# Patient Record
Sex: Female | Born: 1955 | Race: Black or African American | Hispanic: No | Marital: Married | Smoking: Former smoker
Health system: Southern US, Community
[De-identification: ages and names within clinical notes are randomized; demographics above are authoritative.]

## PROBLEM LIST (undated history)

## (undated) DIAGNOSIS — G35 Multiple sclerosis: Secondary | ICD-10-CM

## (undated) DIAGNOSIS — E119 Type 2 diabetes mellitus without complications: Secondary | ICD-10-CM

## (undated) DIAGNOSIS — I499 Cardiac arrhythmia, unspecified: Secondary | ICD-10-CM

## (undated) DIAGNOSIS — I480 Paroxysmal atrial fibrillation: Secondary | ICD-10-CM

## (undated) DIAGNOSIS — I509 Heart failure, unspecified: Secondary | ICD-10-CM

## (undated) DIAGNOSIS — M1990 Unspecified osteoarthritis, unspecified site: Secondary | ICD-10-CM

## (undated) DIAGNOSIS — C801 Malignant (primary) neoplasm, unspecified: Secondary | ICD-10-CM

## (undated) DIAGNOSIS — I10 Essential (primary) hypertension: Secondary | ICD-10-CM

## (undated) HISTORY — DX: Paroxysmal atrial fibrillation: I48.0

## (undated) HISTORY — DX: Essential (primary) hypertension: I10

## (undated) HISTORY — DX: Heart failure, unspecified: I50.9

---

## 2013-10-22 ENCOUNTER — Other Ambulatory Visit: Payer: Self-pay | Admitting: Geriatric Medicine

## 2013-10-22 DIAGNOSIS — N644 Mastodynia: Secondary | ICD-10-CM

## 2013-10-31 ENCOUNTER — Other Ambulatory Visit: Payer: Self-pay

## 2013-11-26 ENCOUNTER — Ambulatory Visit
Admission: RE | Admit: 2013-11-26 | Discharge: 2013-11-26 | Disposition: A | Discharge status: Home / Self Care | Payer: Medicaid Other | Source: Ambulatory Visit | Attending: Geriatric Medicine | Admitting: Geriatric Medicine

## 2013-11-26 ENCOUNTER — Encounter (INDEPENDENT_AMBULATORY_CARE_PROVIDER_SITE_OTHER): Payer: Self-pay

## 2013-11-26 DIAGNOSIS — N644 Mastodynia: Secondary | ICD-10-CM

## 2014-10-27 ENCOUNTER — Other Ambulatory Visit: Payer: Self-pay

## 2014-10-27 DIAGNOSIS — Z9289 Personal history of other medical treatment: Secondary | ICD-10-CM

## 2014-12-02 ENCOUNTER — Ambulatory Visit
Admission: RE | Admit: 2014-12-02 | Discharge: 2014-12-02 | Disposition: A | Discharge status: Home / Self Care | Payer: Medicaid Other | Source: Ambulatory Visit

## 2014-12-02 DIAGNOSIS — Z9289 Personal history of other medical treatment: Secondary | ICD-10-CM

## 2015-06-28 ENCOUNTER — Encounter: Payer: Self-pay | Admitting: Neurology

## 2015-06-28 ENCOUNTER — Ambulatory Visit (INDEPENDENT_AMBULATORY_CARE_PROVIDER_SITE_OTHER): Payer: Medicaid Other | Admitting: Neurology

## 2015-06-28 VITALS — BP 136/86 | HR 68

## 2015-06-28 DIAGNOSIS — G35 Multiple sclerosis: Secondary | ICD-10-CM | POA: Diagnosis not present

## 2015-06-28 NOTE — Patient Instructions (Signed)
I can't say that you have MS.  Unfortunately, we are unable to perform the tests that would help confirm MS.  But since you have been clinically stable off of MS medications for the past 20 years, I wouldn't treat you for MS even if you have it.  No further management or workup needed.

## 2015-06-28 NOTE — Progress Notes (Signed)
Chart forwarded.  

## 2015-06-28 NOTE — Progress Notes (Signed)
NEUROLOGY CONSULTATION NOTE  Zoe Cobb MRN: [id number] DOB: 11-22-1955  Referring provider: Norva Riffle, PA Primary care provider: Norva Riffle, PA  Reason for consult:  MS  HISTORY OF PRESENT ILLNESS: Zoe Cobb is a 59 year old right-handed female with morbid obesity and hypertension who presents for evaluation of possible multiple sclerosis.  In 1994-95, she had a two week period of falls, urinary incontinence, bilateral blurred vision and difficulty holding a cup.  She was admitted to a hospital in [place], [place] where she was diagnosed with multiple sclerosis.  She said she only had a test where she had electrodes attached to her chest and head and was told to lay still, which I presume was an EEG.  She maintains that she did not have any brain imaging or lumbar puncture performed.  She was not treated with steroids but she was discharged on naprosyn and she improved.  She never had any recurrence of neurological symptoms.  She was never started on disease modifying therapy, nor has she followed up with a neurologist.  Over the years, she has become morbidly obese and is now wheelchair-bound.  She is unable to ambulate.    PAST MEDICAL HISTORY: Past Medical History  Diagnosis Date  . Hypertension     PAST SURGICAL HISTORY: No past surgical history on file.  MEDICATIONS: No current outpatient prescriptions on file prior to visit.   No current facility-administered medications on file prior to visit.    ALLERGIES: Allergies  Allergen Reactions  . Penicillins Swelling    FAMILY HISTORY: No family history on file.  SOCIAL HISTORY: Social History   Social History  . Marital Status: Single    Spouse Name: N/A  . Number of Children: N/A  . Years of Education: N/A   Occupational History  . Not on file.   Social History Main Topics  . Smoking status: Former Games developer  . Smokeless tobacco: Not on file  . Alcohol Use: Not on file  . Drug Use: Not on file    . Sexual Activity: Not on file   Other Topics Concern  . Not on file   Social History Narrative   Pt lives by herself, she completed hs.     REVIEW OF SYSTEMS: Constitutional: No fevers, chills, or sweats, no generalized fatigue, change in appetite Eyes: No visual changes, double vision, eye pain Ear, nose and throat: No hearing loss, ear pain, nasal congestion, sore throat Cardiovascular: No chest pain, palpitations Respiratory:  No shortness of breath at rest or with exertion, wheezes GastrointestinaI: No nausea, vomiting, diarrhea, abdominal pain, fecal incontinence Genitourinary:  No dysuria, urinary retention or frequency Musculoskeletal:  No neck pain, back pain Integumentary: No rash, pruritus, skin lesions Neurological: as above Psychiatric: No depression, insomnia, anxiety Endocrine: No palpitations, fatigue, diaphoresis, mood swings, change in appetite, change in weight, increased thirst Hematologic/Lymphatic:  No anemia, purpura, petechiae. Allergic/Immunologic: no itchy/runny eyes, nasal congestion, recent allergic reactions, rashes  PHYSICAL EXAM: Filed Vitals:   06/28/15 0909  BP: 136/86  Pulse: 68   General: No acute distress.  Patient appears well-groomed.  Morbidly obese Head:  Normocephalic/atraumatic Eyes:  fundi unremarkable, without vessel changes, exudates, hemorrhages or papilledema. Neck: supple, no paraspinal tenderness, full range of motion Back: No paraspinal tenderness Heart: regular rate and rhythm Lungs: Clear to auscultation bilaterally. Vascular: No carotid bruits. Neurological Exam: Mental status: alert and oriented to person, place, and time, recent and remote memory intact, fund of knowledge intact, attention and concentration intact, speech  fluent and not dysarthric, language intact. Cranial nerves: CN I: not tested CN II: pupils equal, round and reactive to light, visual fields intact, fundi unremarkable, without vessel changes,  exudates, hemorrhages or papilledema. CN III, IV, VI:  full range of motion, no nystagmus, no ptosis CN V: facial sensation intact CN VII: upper and lower face symmetric CN VIII: hearing intact CN IX, X: gag intact, uvula midline CN XI: sternocleidomastoid and trapezius muscles intact CN XII: tongue midline Bulk & Tone: normal, no fasciculations. Motor:  Unable to abduct arms.  Triceps, biceps and grip 5/5.  Unable to move legs proximally but distal strength in lower extremities, including ankle dorsiflexion, 5/5. Sensation:  Pinprick reduced in right hand and both feet.  Vibration sensation intact. Deep Tendon Reflexes:  2+ in brachioradialis reflexes, otherwise areflexive, toes downgoing. Finger to nose testing:  Without dysmetria.  Heel to shin:  Unable to assess Gait:  Wheelchair-bound.  IMPRESSION: History of MS Morbid obesity  I cannot make a clinical diagnosis of multiple sclerosis based on exam and history.  She did not have an MS workup and she did not have any MS "flare-ups" over the years.  She is disabled and wheelchair-bound due to morbid obesity.  Unfortunately, due to her size, I am unable to perform an MS workup (MRI and lumbar puncture) but I probably wouldn't treat with disease-modifying therapy anyway since she has been stable (if indeed she has MS).  Therefore, no further workup or treatment warranted.  She should continue to focus on weight loss.  45 minutes spent face to face with patient, over 50% spent discussing diagnosis and impression.  Thank you for allowing me to take part in the care of this patient.  Shon Millet, DO  CC:  Norva Riffle, PA

## 2016-03-14 ENCOUNTER — Other Ambulatory Visit: Payer: Self-pay | Admitting: Adult Health

## 2016-03-14 ENCOUNTER — Other Ambulatory Visit: Payer: Self-pay | Admitting: General Practice

## 2016-03-14 DIAGNOSIS — Z1231 Encounter for screening mammogram for malignant neoplasm of breast: Secondary | ICD-10-CM

## 2016-03-24 ENCOUNTER — Ambulatory Visit
Admission: RE | Admit: 2016-03-24 | Discharge: 2016-03-24 | Disposition: A | Discharge status: Home / Self Care | Payer: Medicaid Other | Source: Ambulatory Visit | Attending: Family Medicine | Admitting: Family Medicine

## 2016-03-24 DIAGNOSIS — Z1231 Encounter for screening mammogram for malignant neoplasm of breast: Secondary | ICD-10-CM

## 2016-08-28 ENCOUNTER — Observation Stay (HOSPITAL_COMMUNITY): Payer: Medicaid Other

## 2016-08-28 ENCOUNTER — Encounter (HOSPITAL_COMMUNITY): Payer: Self-pay | Admitting: Emergency Medicine

## 2016-08-28 ENCOUNTER — Emergency Department (HOSPITAL_COMMUNITY): Payer: Medicaid Other

## 2016-08-28 ENCOUNTER — Observation Stay (HOSPITAL_COMMUNITY)
Admission: EM | Admit: 2016-08-28 | Discharge: 2016-08-30 | Disposition: A | Discharge status: Home / Self Care | Payer: Medicaid Other | Attending: Family Medicine | Admitting: Family Medicine

## 2016-08-28 DIAGNOSIS — R0602 Shortness of breath: Secondary | ICD-10-CM | POA: Diagnosis not present

## 2016-08-28 DIAGNOSIS — Z880 Allergy status to penicillin: Secondary | ICD-10-CM | POA: Insufficient documentation

## 2016-08-28 DIAGNOSIS — B1920 Unspecified viral hepatitis C without hepatic coma: Secondary | ICD-10-CM | POA: Diagnosis not present

## 2016-08-28 DIAGNOSIS — Z79899 Other long term (current) drug therapy: Secondary | ICD-10-CM | POA: Insufficient documentation

## 2016-08-28 DIAGNOSIS — Z993 Dependence on wheelchair: Secondary | ICD-10-CM | POA: Diagnosis not present

## 2016-08-28 DIAGNOSIS — Z87891 Personal history of nicotine dependence: Secondary | ICD-10-CM | POA: Insufficient documentation

## 2016-08-28 DIAGNOSIS — Z6841 Body Mass Index (BMI) 40.0 and over, adult: Secondary | ICD-10-CM | POA: Diagnosis not present

## 2016-08-28 DIAGNOSIS — B1910 Unspecified viral hepatitis B without hepatic coma: Secondary | ICD-10-CM | POA: Insufficient documentation

## 2016-08-28 DIAGNOSIS — D696 Thrombocytopenia, unspecified: Secondary | ICD-10-CM | POA: Insufficient documentation

## 2016-08-28 DIAGNOSIS — I509 Heart failure, unspecified: Secondary | ICD-10-CM | POA: Diagnosis present

## 2016-08-28 DIAGNOSIS — I10 Essential (primary) hypertension: Secondary | ICD-10-CM

## 2016-08-28 DIAGNOSIS — Z7401 Bed confinement status: Secondary | ICD-10-CM | POA: Insufficient documentation

## 2016-08-28 DIAGNOSIS — Z7982 Long term (current) use of aspirin: Secondary | ICD-10-CM | POA: Diagnosis not present

## 2016-08-28 DIAGNOSIS — I110 Hypertensive heart disease with heart failure: Secondary | ICD-10-CM | POA: Diagnosis not present

## 2016-08-28 DIAGNOSIS — I5030 Unspecified diastolic (congestive) heart failure: Secondary | ICD-10-CM | POA: Diagnosis not present

## 2016-08-28 DIAGNOSIS — G4733 Obstructive sleep apnea (adult) (pediatric): Secondary | ICD-10-CM | POA: Insufficient documentation

## 2016-08-28 DIAGNOSIS — Z8249 Family history of ischemic heart disease and other diseases of the circulatory system: Secondary | ICD-10-CM | POA: Diagnosis not present

## 2016-08-28 DIAGNOSIS — L8990 Pressure ulcer of unspecified site, unspecified stage: Secondary | ICD-10-CM | POA: Insufficient documentation

## 2016-08-28 DIAGNOSIS — D649 Anemia, unspecified: Secondary | ICD-10-CM | POA: Diagnosis not present

## 2016-08-28 HISTORY — DX: Morbid (severe) obesity due to excess calories: E66.01

## 2016-08-28 LAB — BASIC METABOLIC PANEL
ANION GAP: 8 (ref 5–15)
BUN: 21 mg/dL — AB (ref 6–20)
CO2: 28 mmol/L (ref 22–32)
Calcium: 9.3 mg/dL (ref 8.9–10.3)
Chloride: 110 mmol/L (ref 101–111)
Creatinine, Ser: 0.88 mg/dL (ref 0.44–1.00)
GFR calc Af Amer: >60 mL/min (ref >60)
GLUCOSE: 90 mg/dL (ref 65–99)
POTASSIUM: 4.6 mmol/L (ref 3.5–5.1)
Sodium: 146 mmol/L — ABNORMAL HIGH (ref 135–145)

## 2016-08-28 LAB — CBC
HEMATOCRIT: 33 % — AB (ref 36.0–46.0)
HEMOGLOBIN: 9.9 g/dL — AB (ref 12.0–15.0)
MCH: 25.1 pg — ABNORMAL LOW (ref 26.0–34.0)
MCHC: 30 g/dL (ref 30.0–36.0)
MCV: 83.8 fL (ref 78.0–100.0)
Platelets: 104 10*3/uL — ABNORMAL LOW (ref 150–400)
RBC: 3.94 MIL/uL (ref 3.87–5.11)
RDW: 15.9 % — AB (ref 11.5–15.5)
WBC: 4.7 10*3/uL (ref 4.0–10.5)

## 2016-08-28 LAB — INFLUENZA PANEL BY PCR (TYPE A & B)
INFLBPCR: NEGATIVE
Influenza A By PCR: NEGATIVE

## 2016-08-28 LAB — BRAIN NATRIURETIC PEPTIDE: B Natriuretic Peptide: 220.1 pg/mL — ABNORMAL HIGH (ref 0.0–100.0)

## 2016-08-28 LAB — RETICULOCYTES
RBC.: 3.76 MIL/uL — AB (ref 3.87–5.11)
RETIC CT PCT: 1.1 % (ref 0.4–3.1)
Retic Count, Absolute: 41.4 10*3/uL (ref 19.0–186.0)

## 2016-08-28 LAB — LIPID PANEL
CHOLESTEROL: 139 mg/dL (ref 0–200)
HDL: 58 mg/dL (ref >40)
LDL Cholesterol: 70 mg/dL (ref 0–99)
TRIGLYCERIDES: 53 mg/dL (ref <150)
Total CHOL/HDL Ratio: 2.4 RATIO
VLDL: 11 mg/dL (ref 0–40)

## 2016-08-28 LAB — TSH: TSH: 5.721 u[IU]/mL — AB (ref 0.350–4.500)

## 2016-08-28 LAB — IRON AND TIBC
IRON: 67 ug/dL (ref 28–170)
SATURATION RATIOS: 13 % (ref 10.4–31.8)
TIBC: 500 ug/dL — AB (ref 250–450)
UIBC: 433 ug/dL

## 2016-08-28 LAB — FERRITIN: Ferritin: 26 ng/mL (ref 11–307)

## 2016-08-28 LAB — TROPONIN I
Troponin I: <0.03 ng/mL (ref <0.03)
Troponin I: <0.03 ng/mL (ref <0.03)

## 2016-08-28 LAB — I-STAT TROPONIN, ED: Troponin i, poc: 0 ng/mL (ref 0.00–0.08)

## 2016-08-28 LAB — GLUCOSE, CAPILLARY: GLUCOSE-CAPILLARY: 86 mg/dL (ref 65–99)

## 2016-08-28 LAB — FOLATE: FOLATE: 6.2 ng/mL (ref >5.9)

## 2016-08-28 LAB — VITAMIN B12: Vitamin B-12: 3942 pg/mL — ABNORMAL HIGH (ref 180–914)

## 2016-08-28 MED ORDER — ALBUTEROL SULFATE (2.5 MG/3ML) 0.083% IN NEBU
2.5000 mg | INHALATION_SOLUTION | RESPIRATORY_TRACT | Status: DC
  Administered 2016-08-28: 2.5 mg via RESPIRATORY_TRACT
  Filled 2016-08-28 (×2): qty 3

## 2016-08-28 MED ORDER — VITAMIN B-12 1000 MCG PO TABS
500.0000 ug | ORAL_TABLET | Freq: Every day | ORAL | Status: DC
  Administered 2016-08-29: 500 ug via ORAL
  Filled 2016-08-28: qty 1

## 2016-08-28 MED ORDER — VITAMIN D 1000 UNITS PO TABS
1000.0000 [IU] | ORAL_TABLET | Freq: Every day | ORAL | Status: DC
  Administered 2016-08-29 – 2016-08-30 (×2): 1000 [IU] via ORAL
  Filled 2016-08-28 (×2): qty 1

## 2016-08-28 MED ORDER — ENOXAPARIN SODIUM 40 MG/0.4ML ~~LOC~~ SOLN
40.0000 mg | SUBCUTANEOUS | Status: DC
  Administered 2016-08-28: 40 mg via SUBCUTANEOUS
  Filled 2016-08-28: qty 0.4

## 2016-08-28 MED ORDER — ACETAMINOPHEN 325 MG PO TABS
650.0000 mg | ORAL_TABLET | Freq: Four times a day (QID) | ORAL | Status: DC | PRN
  Administered 2016-08-29 – 2016-08-30 (×2): 650 mg via ORAL
  Filled 2016-08-28 (×2): qty 2

## 2016-08-28 MED ORDER — ALBUTEROL SULFATE (2.5 MG/3ML) 0.083% IN NEBU
5.0000 mg | INHALATION_SOLUTION | Freq: Once | RESPIRATORY_TRACT | Status: AC
  Administered 2016-08-28: 5 mg via RESPIRATORY_TRACT
  Filled 2016-08-28: qty 6

## 2016-08-28 MED ORDER — POLYETHYLENE GLYCOL 3350 17 G PO PACK: 17.0000 g | PACK | Freq: Every day | ORAL | Status: DC | PRN

## 2016-08-28 MED ORDER — ALBUTEROL SULFATE (2.5 MG/3ML) 0.083% IN NEBU: 2.5000 mg | INHALATION_SOLUTION | RESPIRATORY_TRACT | Status: DC | PRN

## 2016-08-28 MED ORDER — ACETAMINOPHEN 650 MG RE SUPP: 650.0000 mg | Freq: Four times a day (QID) | RECTAL | Status: DC | PRN

## 2016-08-28 MED ORDER — FUROSEMIDE 10 MG/ML IJ SOLN
40.0000 mg | Freq: Two times a day (BID) | INTRAMUSCULAR | Status: DC
  Administered 2016-08-28 – 2016-08-29 (×2): 40 mg via INTRAVENOUS
  Filled 2016-08-28 (×2): qty 4

## 2016-08-28 MED ORDER — VITAMIN D 1000 UNITS PO TABS: 1000.0000 [IU] | ORAL_TABLET | Freq: Every day | ORAL | Status: DC

## 2016-08-28 MED ORDER — ALBUTEROL SULFATE (2.5 MG/3ML) 0.083% IN NEBU
2.5000 mg | INHALATION_SOLUTION | Freq: Four times a day (QID) | RESPIRATORY_TRACT | Status: DC
  Administered 2016-08-29: 2.5 mg via RESPIRATORY_TRACT
  Filled 2016-08-28 (×2): qty 3

## 2016-08-28 MED ORDER — IPRATROPIUM BROMIDE 0.02 % IN SOLN
0.5000 mg | Freq: Once | RESPIRATORY_TRACT | Status: AC
  Administered 2016-08-28: 0.5 mg via RESPIRATORY_TRACT
  Filled 2016-08-28: qty 2.5

## 2016-08-28 MED ORDER — ASPIRIN EC 81 MG PO TBEC
81.0000 mg | DELAYED_RELEASE_TABLET | Freq: Every day | ORAL | Status: DC
  Administered 2016-08-28 – 2016-08-30 (×3): 81 mg via ORAL
  Filled 2016-08-28 (×4): qty 1

## 2016-08-28 MED ORDER — GLECAPREVIR-PIBRENTASVIR 100-40 MG PO TABS
1.0000 | ORAL_TABLET | Freq: Every day | ORAL | Status: DC
  Administered 2016-08-28: 1 via ORAL

## 2016-08-28 NOTE — ED Triage Notes (Signed)
Pt from home via [facility] with c/o SOB and wheezing starting yesterday, general malaise x 1 week, and left sided chest discomfort starting during transportation to the ED.  Pt states she was started on lasix several weeks ago and that her swelling has only gotten worse.  Given 1 deuoneb and 5 mg albuterol breathing treatment.  A&O.

## 2016-08-28 NOTE — ED Provider Notes (Signed)
[facility]-EMERGENCY DEPT Provider Note   CSN: [id number] Arrival date & time: 08/28/16  7628   History   Chief Complaint Chief Complaint  Patient presents with  . Shortness of Breath  . Chest Pain    HPI Zoe Cobb is a 61 y.o. female.  HPI     Patient brought to the ER by EMS for SOB and wheezing that started yesterday. She has also had body aches and CP on the left side. She is seen by a Home Health visiting physician with [facility] since she is unable to get to PCP office. She was given a duoneb en route which she says did help mildly she is still has increased effort of breathing and audible wheezing. Recently started on lasix for lower extremity swelling but denies a diagnosis of CHF, non-ambulatory at baseline, pt says she does not get up ever.  She denies having fevers, nausea, vomiting, diarrhea, weakness or confusion. She admits to only a PMH of hypertension. The RN is working on getting records transferred.   Past Medical History:  Diagnosis Date  . Hypertension     Patient Active Problem List   Diagnosis Date Noted  . Morbid obesity due to excess calories (HCC) 06/28/2015    History reviewed. No pertinent surgical history.  OB History    No data available       Home Medications    Prior to Admission medications   Medication Sig Start Date End Date Taking? Authorizing Provider  aspirin 325 MG tablet Take 325 mg by mouth daily.    Yes Historical Provider, MD  cholecalciferol (VITAMIN D) 1000 units tablet Take 1,000 Units by mouth daily.   Yes Historical Provider, MD  cyanocobalamin 500 MCG tablet Take 500 mcg by mouth daily.   Yes Historical Provider, MD  furosemide (LASIX) 20 MG tablet Take 10 mg by mouth daily.   Yes Historical Provider, MD  MAVYRET 100-40 MG TABS Take 1 tablet by mouth daily. 08/16/16  Yes Historical Provider, MD    Family History History reviewed. No pertinent family history.  Social History Social History  Substance Use Topics   . Smoking status: Former Smoker    Types: Cigarettes  . Smokeless tobacco: Never Used  . Alcohol use 0.0 oz/week     Comment: once in a while     Allergies   Penicillins   Review of Systems Review of Systems  Review of Systems All other systems negative except as documented in the HPI. All pertinent positives and negatives as reviewed in the HPI.  Physical Exam Updated Vital Signs BP 108/57   Pulse 63   Temp (!) 96.4 F (35.8 C) (Temporal) Comment: Difficulty obtaining PO or axillary temp, temporal temp obtained instead  Resp 16   Ht 5\' 1"  (1.549 m)   Wt (!) 226.8 kg   SpO2 99%   BMI 94.47 kg/m   Physical Exam  Constitutional: She appears well-developed and well-nourished. No distress.  Morbid obesity  HENT:  Head: Normocephalic and atraumatic.  Eyes: Pupils are equal, round, and reactive to light.  Neck: Normal range of motion. Neck supple.  Cardiovascular: Normal rate and regular rhythm.   Pulmonary/Chest: Effort normal. Tachypnea noted. She has wheezes.  Increased effort of breathing Wheezing resolved with breathing treatments.  Abdominal: Soft.  Musculoskeletal:  Due to body habitus unable to appreciate LE swelling but patient endorses legs are more swollen than normal.  Neurological: She is alert.  Skin: Skin is warm and dry.  Nursing note and vitals reviewed.    ED Treatments / Results  Labs (all labs ordered are listed, but only abnormal results are displayed) Labs Reviewed  BASIC METABOLIC PANEL - Abnormal; Notable for the following:       Result Value   Sodium 146 (*)    BUN 21 (*)    All other components within normal limits  CBC - Abnormal; Notable for the following:    Hemoglobin 9.9 (*)    HCT 33.0 (*)    MCH 25.1 (*)    RDW 15.9 (*)    Platelets 104 (*)    All other components within normal limits  BRAIN NATRIURETIC PEPTIDE - Abnormal; Notable for the following:    B Natriuretic Peptide 220.1 (*)    All other components within normal  limits  INFLUENZA PANEL BY PCR (TYPE A & B)  I-STAT TROPOININ, ED    EKG  EKG Interpretation  Date/Time:  Monday August 28 2016 08:58:17 EST Ventricular Rate:  66 PR Interval:    QRS Duration: 97 QT Interval:  433 QTC Calculation: 454 R Axis:   64 Text Interpretation:  Normal sinus rhythm Low voltage, precordial leads no acute ST/T changes No old tracing to compare Confirmed by GOLDSTON MD, SCOTT [phone]) on 08/28/2016 9:06:31 AM       Radiology Dg Chest 1 View  Result Date: 08/28/2016 CLINICAL DATA:  Sob for a few weeks,, Unable to do lat due to pt size /big arms unable to penetrate and unable to bring arms higher morbid obesity. EXAM: CHEST 1 VIEW COMPARISON:  None. FINDINGS: Moderately degraded exam secondary to patient body habitus and AP portable technique. Midline trachea. Cardiomegaly accentuated by AP portable technique. Moderate right hemidiaphragm elevation. No definite pleural fluid. No pneumothorax. Interstitial prominence. Left lung base not well evaluated. Otherwise, no lobar consolidation.   IMPRESSION: Moderately degraded exam, as detailed above. Cardiomegaly with pulmonary interstitial prominence. Although this could be technique related, mild venous congestion cannot be excluded. Electronically Signed   By: Jeronimo Greaves M.D.   On: 08/28/2016 09:39   Procedures Procedures (including critical care time)  Medications Ordered in ED Medications  albuterol (PROVENTIL) (2.5 MG/3ML) 0.083% nebulizer solution 5 mg (5 mg Nebulization Given 08/28/16 1053)  ipratropium (ATROVENT) nebulizer solution 0.5 mg (0.5 mg Nebulization Given 08/28/16 1053)     Initial Impression / Assessment and Plan / ED Course  I have reviewed the triage vital signs and the nursing notes.  Pertinent labs & imaging results that were available during my care of the patient were reviewed by me and considered in my medical decision making (see chart for details).  Dr. Criss Alvine has seen patient as  well, her history, symptoms, PE and chest xray with BNP shows concern for new onset CHF. Patient lives alone and is unable to do much without getting really short of breath, I believe she needs an admission obs for cardiac echo for evaluation of new onset CHF.  I spoke with [facility], they have agreed to admission Dr. Lum Babe, Tele, obs, [facility]mits   Final Clinical Impressions(s) / ED Diagnoses   Final diagnoses:  New onset of congestive heart failure [facility])    New Prescriptions New Prescriptions   No medications on file     Marlon Pel, PA-C 08/28/16 1144    Pricilla Loveless, MD 09/01/16 2222

## 2016-08-28 NOTE — ED Notes (Signed)
Admitting at bedside 

## 2016-08-28 NOTE — Progress Notes (Signed)
Received Zoe Cobb from ED via transport on [facility] bed.  Bed inflated.  Patient alert and oriented.  Oriented to room and how to call RN or NT.  Patient denies pain.

## 2016-08-28 NOTE — ED Notes (Signed)
Patient transported to X-ray 

## 2016-08-28 NOTE — Consult Note (Signed)
CARDIOLOGY CONSULT NOTE   Patient ID: Zoe Cobb MRN: [id number] DOB/AGE: June 01, 1956 61 y.o.  Admit date: 08/28/2016  Requesting Physician: Dr. Lum Babe Primary Physician:   Norm Salt, PA Primary Cardiologist:  New  Reason for Consultation:  New CHF  HPI: Zoe Cobb is a 61 y.o. female with a history of super morbid obesity, Hep B/C, HTN and wheelchair bound who presented to [facility] ED today with worsening LE edema and SOB with wheezing.   The patient lives at home alone but her sister does help with her care and medications. She also has a primary care doctor that does home visits. She denies a history of HLD, DMT2 or cardiac disease. She does take daily aspirin and medication for HTN although she does not know what. She is wheelchair-bound and does not perform any level of exertion. She was previously diagnosed with multiple sclerosis but then was told by a neurologist that she did not have this. She is unable to ambulate or stand.  She was in her usual state of health until last week when she started to notice lower extremity edema as well as shortness of breath. she was seen by her PCP who started her on Lasix but this did not seem to help. She had progressively worse lower extremity edema as well as shortness of breath. SOB not necessarily worse in a supine position or better propped up on pillows. No PND. She denies chest pain, dizziness or syncope. No palpitations.   She called EMS this morning due to dyspnea and she was brought to the ER. Chest x-ray shows possible CHF and BNP mildly elevated around 200. Blood pressures have been soft. She was given albuterol with some improvement.   Past Medical History:  Diagnosis Date  . Hypertension   . Morbid obesity (HCC)      History reviewed. No pertinent surgical history.  Allergies  Allergen Reactions  . Penicillins Swelling    Has patient had a PCN reaction causing immediate rash, facial/tongue/throat swelling, SOB or  lightheadedness with hypotension: Yes Has patient had a PCN reaction causing severe rash involving mucus membranes or skin necrosis: No Has patient had a PCN reaction that required hospitalization No Has patient had a PCN reaction occurring within the last 10 years: No If all of the above answers are "NO", then may proceed with Cephalosporin use.     I have reviewed the patient's current medications     Prior to Admission medications   Medication Sig Start Date End Date Taking? Authorizing Provider  aspirin 325 MG tablet Take 325 mg by mouth daily.    Yes Historical Provider, MD  cholecalciferol (VITAMIN D) 1000 units tablet Take 1,000 Units by mouth daily.   Yes Historical Provider, MD  cyanocobalamin 500 MCG tablet Take 500 mcg by mouth daily.   Yes Historical Provider, MD  furosemide (LASIX) 20 MG tablet Take 10 mg by mouth daily.   Yes Historical Provider, MD  MAVYRET 100-40 MG TABS Take 1 tablet by mouth daily. 08/16/16  Yes Historical Provider, MD     Social History   Social History  . Marital status: Single    Spouse name: N/A  . Number of children: N/A  . Years of education: N/A   Occupational History  . Not on file.   Social History Main Topics  . Smoking status: Former Smoker    Types: Cigarettes  . Smokeless tobacco: Never Used  . Alcohol use 0.0 oz/week  Comment: once in a while  . Drug use: No  . Sexual activity: Not on file   Other Topics Concern  . Not on file   Social History Narrative   Pt lives by herself, she completed hs.     Family Status  Relation Status  . Mother    Family History  Problem Relation Age of Onset  . Hypertension Mother   . Diabetes Mother     ROS:  Full 14 point review of systems complete and found to be negative unless listed above.  Physical Exam: Blood pressure 97/55, pulse 67, temperature (!) 96.7 F (35.9 C), temperature source Temporal, resp. rate 22, height 5\' 1"  (1.549 m), weight (!) 500 lb (226.8 kg), SpO2  93 %.  General: Well developed, well nourished, female in no acute distress, morbidly obese Head: Eyes PERRLA, No xanthomas.   Normocephalic and atraumatic, oropharynx without edema or exudate. Lungs: expiratory wheezing  Heart: HRRR S1 S2, no rub/gallop, Heart irregular rate and rhythm with S1, S2  No murmur. pulses are 2+ extrem.   Neck: No carotid bruits. No lymphadenopathy. JVD diffuclt to assess given body habitus. Abdomen: Bowel sounds present, abdomen soft and non-tender without masses or hernias noted. Msk:  No spine or cva tenderness. No weakness, no joint deformities or effusions. Extremities: No clubbing or cyanosis.  2+ LE edema.  Neuro: Alert and oriented X 3. No focal deficits noted. Psych:  Good affect, responds appropriately Skin: No rashes or lesions noted.  Labs:   Lab Results  Component Value Date   WBC 4.7 08/28/2016   HGB 9.9 (L) 08/28/2016   HCT 33.0 (L) 08/28/2016   MCV 83.8 08/28/2016   PLT 104 (L) 08/28/2016   No results for input(s): INR in the last 72 hours.   Recent Labs Lab 08/28/16 0950  NA 146*  K 4.6  CL 110  CO2 28  BUN 21*  CREATININE 0.88  CALCIUM 9.3  GLUCOSE 90   No results found for: MG No results for input(s): CKTOTAL, CKMB, TROPONINI in the last 72 hours.  Recent Labs  08/28/16 1022  TROPIPOC 0.00   No results found for: PROBNP No results found for: CHOL, HDL, LDLCALC, TRIG No results found for: DDIMER No results found for: LIPASE, AMYLASE No results found for: TSH, T4TOTAL, T3FREE, THYROIDAB No results found for: VITAMINB12, FOLATE, FERRITIN, TIBC, IRON, RETICCTPCT  Echo: pending  ECG:  NSR HR 66  Radiology:  Dg Chest 1 View  Result Date: 08/28/2016 CLINICAL DATA:  Sob for a few weeks,, Unable to do lat due to pt size /big arms unable to penetrate and unable to bring arms higher morbid obesity. EXAM: CHEST 1 VIEW COMPARISON:  None. FINDINGS: Moderately degraded exam secondary to patient body habitus and AP portable  technique. Midline trachea. Cardiomegaly accentuated by AP portable technique. Moderate right hemidiaphragm elevation. No definite pleural fluid. No pneumothorax. Interstitial prominence. Left lung base not well evaluated. Otherwise, no lobar consolidation. IMPRESSION: Moderately degraded exam, as detailed above. Cardiomegaly with pulmonary interstitial prominence. Although this could be technique related, mild venous congestion cannot be excluded. Electronically Signed   By: Jeronimo Greaves M.D.   On: 08/28/2016 09:39    ASSESSMENT AND PLAN:    Active Problems:   Morbid obesity due to excess calories (HCC)   CHF (congestive heart failure) (HCC)   Shortness of breath  Zoe Cobb is a 61 y.o. female with a history of super morbid obesity, HTN and wheelchair bound who presented  to [facility] ED today with worsening LE edema and SOB with wheezing.   Acute CHF, unspecified: 2D ECHO pending. BNP mildly elevated (but can be falsely low in the setting obesity). CXR with possible pulmonary vascular congestion. Will start her on lasix 40mg  BID and continue to monitor.    HTN: BPs have been soft. Continue to monitor.  Super morbid obesity: Body mass index is 94.47 kg/m. Diet and weight loss recommended. She likely has undiagnosed OHS/OSA and sleep study can be set up as an outpatient  Hep C: continue Mavyret  Signed: Cline Crock, PA-C 08/28/2016 2:38 PM  Pager [phone]  Co-Sign MD  Patient examined chart reviewed. No evidence of CHF. She is morbidly obese with OSA and LE edema From being bed bound and venous disease. CXR no CHF for body habitus and BNP in 200 range not  Significant. Agree with echo and bid lasix but cardiology has nothing else to offer this morbidly obese non Ambulatory patient. Will sign off after echo done. Consider home DVT prophylaxis  Charlton Haws

## 2016-08-28 NOTE — H&P (Signed)
Family Medicine Teaching [facility] Admission History and Physical Service Pager: [phone]  Patient name: Zoe Cobb Medical record number: [id number] Date of birth: 04/16/1956 Age: 61 y.o. Gender: female  Primary Care Provider: Norm Salt, PA Consultants: cardiology Code Status: Full (per discussion on admission)  Chief Complaint: shortness of breath  Assessment and Plan: Zoe Cobb is a 61 y.o. female presenting with shortness of breath. PMH is significant for morbid obesity, HTN, Hepatitis B and C.   Shortness of breath: Initially tachypneic in the ED per PA but then resolved after albuterol. Vitals stable, no tachypnea or hypoxia noted on exam. Physical exam notable for morbid obesity, inspiratory and expiratory wheezing, possible systolic murmur, and 2+ pitting edema in bilateral lower extremities. BNP noted to be 220.1 (will take BMI into account with this lab). EKG showing no ST changes or arrhythmia. Chest x-ray poor imaging due to body habitus but there was noted to be Cardiomegaly with pulmonary interstitial prominence. Shortness of breath likely multifactorial however, leading diagnosis is is new onset congestive heart failure. Patient likely has obesity related hypoventilation as well as possible sleep apnea. Less likely infectious etiology as patient is afebrile and there is no leukocytosis. Less likely pulmonary embolism as the patient is not tachypneic and no signs of unilateral extremity swelling, however patient is at increased risk due to immobility. Less likely COPD as patient denies smoking cigarettes, although per chart review did admit to occasional cigarette use. - Admit to FPTS, telemetry, attending Dr. Randolm Idol - Vital signs per floor  - continuous cardiac monitoring  -Echocardiogram -Trend troponins -A.m. EKG's -Aspirin 81 mg daily -Furosemide 40 mg IV twice a day -Consult cardiology -Risk stratification labs; TSH, lipid panel, hemoglobin  A1c -Nebulized albuterol every 4 scheduled, consider changing to when necessary if wheezing improves after 24 hours -Need to obtain baseline weight -Daily weights -Strict I's and O's, Foley catheter -Daily BMP - CPAP qhs for likely OSA  Morbid Obesity: Self reported weight is 500 lbs. Practically immobile at baseline (unable to ambulate or stand).  - Nutrition consulted - Carb modified/heart healthy diet -Risk stratification labs as above  Thrombocytopenia: Platelets 104. Unknown baseline. -On Lovenox for DVT prophylaxis, considering stopping prophylaxis if thrombocytopenia worsens -Daily CBC  Normocytic anemia: Hemoglobin 9.9 with MCV of 83. Possibly B12 deficiency as B12 is on patient's medication list. -Anemia panel -Daily CBC - Continue home B12  Hepatitis B and C: Patient reports having hepatitis B and C. She is currently being treated for her hepatitis C  - Continue home medication (Mavyret)  FEN/GI: Carb modified/heart healthy diet, no IV fluids Prophylaxis: Lovenox  Disposition: Admit to family practice teaching service, telemetry floor, attending Dr. Randolm Idol  History of Present Illness:  Zoe Cobb is a 61 y.o. female with PMH of morbid obesity, HTN, Hep B and C who presenting with worsening dyspnea x 2 days.   Patient states that she became more short of breath over the last 2 days. She notes that at baseline she is unable to ambulate or stand due to her body habitus. She notes that she realized she was more short of breath when she was rolling herself out of her wheelchair onto her bed 2 days ago and yesterday it was the worst it has ever been . Usually  rolling into bed does not cause her to feel short of breath.  She does not that sometimes when rolling out of that she does get short of breath. Her sister lives a couple houses  down from her and prepares all of her meals for her and helps her with activities of daily living. She also has a nurse who visits her every day.    Patient states that 2 weeks ago she was seen by her doctor at her home and was started on 20 mg of Lasix daily because she had some increased swelling in her lower extremities, but at that time she did not have any shortness of breath.  She denies any fevers, chills, body aches, nausea, vomiting, diarrhea, constipation, abdominal pain, chest pain, leg pain, numbness, tingling.    Review Of Systems: Per HPI with the following additions: See history of present illness  ROS  Patient Active Problem List   Diagnosis Date Noted  . Morbid obesity due to excess calories (HCC) 06/28/2015    Past Medical History: Past Medical History:  Diagnosis Date  . Hypertension     Past Surgical History: History reviewed. No pertinent surgical history.  Social History: Social History  Substance Use Topics  . Smoking status: Former Smoker    Types: Cigarettes  . Smokeless tobacco: Never Used  . Alcohol use 0.0 oz/week     Comment: once in a while   Additional social history: Lives alone at home Please also refer to relevant sections of EMR.  Family History: Mother and sister have hypertension, mother had a heart attack at 75 years old  Allergies and Medications: Allergies  Allergen Reactions  . Penicillins Swelling    Has patient had a PCN reaction causing immediate rash, facial/tongue/throat swelling, SOB or lightheadedness with hypotension: Yes Has patient had a PCN reaction causing severe rash involving mucus membranes or skin necrosis: No Has patient had a PCN reaction that required hospitalization No Has patient had a PCN reaction occurring within the last 10 years: No If all of the above answers are "NO", then may proceed with Cephalosporin use.    No current facility-administered medications on file prior to encounter.    Current Outpatient Prescriptions on File Prior to Encounter  Medication Sig Dispense Refill  . aspirin 325 MG tablet Take 325 mg by mouth daily.        Objective: BP 113/67   Pulse 69   Temp (!) 96.7 F (35.9 C) (Temporal)   Resp 18   Ht 5\' 1"  (1.549 m)   Wt (!) 500 lb (226.8 kg)   SpO2 96%   BMI 94.47 kg/m  Exam: General: In no acute distress, laying in hospital bed, morbidly obese, pleasant Eyes: Pupils equal and reactive to light, nonicteric sclera ENTM: No nasal discharge, no oropharyngeal erythema, poor dentition, no tonsillar exudates Neck: Supple, no lymphadenopathy Cardiovascular: Regular rate and rhythm, systolic murmur best heard in second intercostal space left parasternal area. 2+ pitting edema from ankles to knees bilaterally Respiratory: Normal work of breathing, diffuse inspiratory and expiratory wheezing bilaterally Gastrointestinal: Obese abdomen, unable to appreciate bowel sounds, nontender to palpation MSK: Unable to assess range of motion due to body habitus, patient able to dorsiflex and plantarflex bilateral feet as well as move arms Derm: Ecchymosis noted on bilateral upper arms. Unable to visualize back area as patient was unable to sit up completely Neuro: Alert and oriented 3, cranial nerves II through XII intact, 5 out of 5 grip strength bilaterally, unable to assess gait, normal sensation throughout Psych: Normal mood and affect  Labs and Imaging: CBC BMET   Recent Labs Lab 08/28/16 0950  WBC 4.7  HGB 9.9*  HCT 33.0*  PLT 104*  Recent Labs Lab 08/28/16 0950  NA 146*  K 4.6  CL 110  CO2 28  BUN 21*  CREATININE 0.88  GLUCOSE 90  CALCIUM 9.3     BNP    Component Value Date/Time   BNP 220.1 (H) 08/28/2016 2956    Beaulah Dinning, MD 08/28/2016, 11:42 AM PGY-2, [facility] FPTS Intern pager: [phone], text pages welcome

## 2016-08-29 ENCOUNTER — Observation Stay (HOSPITAL_COMMUNITY): Payer: Medicaid Other

## 2016-08-29 ENCOUNTER — Observation Stay (HOSPITAL_BASED_OUTPATIENT_CLINIC_OR_DEPARTMENT_OTHER): Payer: Medicaid Other

## 2016-08-29 DIAGNOSIS — Z6841 Body Mass Index (BMI) 40.0 and over, adult: Secondary | ICD-10-CM | POA: Diagnosis not present

## 2016-08-29 DIAGNOSIS — Z79899 Other long term (current) drug therapy: Secondary | ICD-10-CM | POA: Diagnosis not present

## 2016-08-29 DIAGNOSIS — I5030 Unspecified diastolic (congestive) heart failure: Secondary | ICD-10-CM | POA: Diagnosis not present

## 2016-08-29 DIAGNOSIS — R0600 Dyspnea, unspecified: Secondary | ICD-10-CM | POA: Diagnosis not present

## 2016-08-29 DIAGNOSIS — I10 Essential (primary) hypertension: Secondary | ICD-10-CM | POA: Diagnosis not present

## 2016-08-29 DIAGNOSIS — R0602 Shortness of breath: Secondary | ICD-10-CM | POA: Diagnosis not present

## 2016-08-29 DIAGNOSIS — I110 Hypertensive heart disease with heart failure: Secondary | ICD-10-CM | POA: Diagnosis not present

## 2016-08-29 LAB — BASIC METABOLIC PANEL WITH GFR
Anion gap: 5 (ref 5–15)
BUN: 23 mg/dL — ABNORMAL HIGH (ref 6–20)
CO2: 28 mmol/L (ref 22–32)
Calcium: 9.3 mg/dL (ref 8.9–10.3)
Chloride: 109 mmol/L (ref 101–111)
Creatinine, Ser: 1.01 mg/dL — ABNORMAL HIGH (ref 0.44–1.00)
GFR calc Af Amer: >60 mL/min
GFR calc non Af Amer: 58 mL/min — ABNORMAL LOW
Glucose, Bld: 110 mg/dL — ABNORMAL HIGH (ref 65–99)
Potassium: 4.8 mmol/L (ref 3.5–5.1)
Sodium: 142 mmol/L (ref 135–145)

## 2016-08-29 LAB — CBC
HCT: 30.4 % — ABNORMAL LOW (ref 36.0–46.0)
Hemoglobin: 9.5 g/dL — ABNORMAL LOW (ref 12.0–15.0)
MCH: 25.7 pg — ABNORMAL LOW (ref 26.0–34.0)
MCHC: 31.3 g/dL (ref 30.0–36.0)
MCV: 82.4 fL (ref 78.0–100.0)
Platelets: 102 10*3/uL — ABNORMAL LOW (ref 150–400)
RBC: 3.69 MIL/uL — ABNORMAL LOW (ref 3.87–5.11)
RDW: 15.8 % — ABNORMAL HIGH (ref 11.5–15.5)
WBC: 5.3 10*3/uL (ref 4.0–10.5)

## 2016-08-29 LAB — TROPONIN I: Troponin I: <0.03 ng/mL

## 2016-08-29 LAB — ECHOCARDIOGRAM COMPLETE
Height: 61 in
Weight: 7920 [oz_av]

## 2016-08-29 LAB — HEMOGLOBIN A1C
HEMOGLOBIN A1C: 5.5 % (ref 4.8–5.6)
MEAN PLASMA GLUCOSE: 111 mg/dL

## 2016-08-29 MED ORDER — ENSURE ENLIVE PO LIQD
237.0000 mL | Freq: Two times a day (BID) | ORAL | Status: DC
  Administered 2016-08-29 – 2016-08-30 (×2): 237 mL via ORAL

## 2016-08-29 MED ORDER — FUROSEMIDE 40 MG PO TABS
40.0000 mg | ORAL_TABLET | Freq: Two times a day (BID) | ORAL | Status: DC
  Administered 2016-08-29 – 2016-08-30 (×2): 40 mg via ORAL
  Filled 2016-08-29 (×2): qty 1

## 2016-08-29 MED ORDER — GLECAPREVIR-PIBRENTASVIR 100-40 MG PO TABS
3.0000 | ORAL_TABLET | Freq: Every day | ORAL | Status: DC
  Administered 2016-08-29 – 2016-08-30 (×2): 3 via ORAL
  Filled 2016-08-29 (×3): qty 3

## 2016-08-29 MED ORDER — FERUMOXYTOL INJECTION 510 MG/17 ML
510.0000 mg | Freq: Once | INTRAVENOUS | Status: AC
  Administered 2016-08-29: 510 mg via INTRAVENOUS
  Filled 2016-08-29: qty 17

## 2016-08-29 MED ORDER — PERFLUTREN LIPID MICROSPHERE
1.0000 mL | INTRAVENOUS | Status: AC | PRN
  Administered 2016-08-29: 2 mL via INTRAVENOUS
  Filled 2016-08-29: qty 10

## 2016-08-29 MED ORDER — ENOXAPARIN SODIUM 120 MG/0.8ML ~~LOC~~ SOLN
0.5000 mg/kg | SUBCUTANEOUS | Status: DC
  Administered 2016-08-29: 115 mg via SUBCUTANEOUS
  Filled 2016-08-29: qty 0.8

## 2016-08-29 MED ORDER — IPRATROPIUM-ALBUTEROL 0.5-2.5 (3) MG/3ML IN SOLN
3.0000 mL | Freq: Two times a day (BID) | RESPIRATORY_TRACT | Status: DC
  Administered 2016-08-29 – 2016-08-30 (×2): 3 mL via RESPIRATORY_TRACT
  Filled 2016-08-29 (×2): qty 3

## 2016-08-29 MED ORDER — GLECAPREVIR-PIBRENTASVIR 100-40 MG PO TABS
3.0000 | ORAL_TABLET | Freq: Every day | ORAL | Status: DC
  Filled 2016-08-29: qty 3

## 2016-08-29 NOTE — Progress Notes (Signed)
Pt states she is having "fine toothache pain" across her chest, vital signs completed. Pt refused nitro, states tylenol will cover the pain. Pt states she thinks it is from her steroid medication. Paged Md, MD aware, no new orders, will continue to monitor, no EKG at this time.   Zoe Cobb

## 2016-08-29 NOTE — Progress Notes (Signed)
Subjective:  Less dyspnea getting echo at bedside now  Objective:  Vitals:   08/29/16 0431 08/29/16 0724 08/29/16 0740 08/29/16 0800  BP: (!) 105/58 109/63 112/61   Pulse: 73 72 72   Resp: 18 18  18   Temp: 97.3 F (36.3 C) 97.6 F (36.4 C) 98.2 F (36.8 C)   TempSrc: Oral Oral Oral   SpO2: 98% 97%  97%  Weight: (!) 495 lb (224.5 kg)     Height:        Intake/Output from previous day:  Intake/Output Summary (Last 24 hours) at 08/29/16 6270 Last data filed at 08/29/16 0720  Gross per 24 hour  Intake              240 ml  Output             1100 ml  Net             -860 ml    Physical Exam: Affect appropriate Morbidly obese black female  HEENT: normal Neck supple with no adenopathy JVP normal no bruits no thyromegaly Lungs clear with no wheezing and good diaphragmatic motion Heart:  S1/S2 no murmur, no rub, gallop or click PMI normal Abdomen: benighn, BS positve, no tenderness, no AAA no bruit.  No HSM or HJR Distal pulses intact with no bruits Plus 3 LE edema Neuro non-focal Skin warm and dry No muscular weakness   Lab Results: Basic Metabolic Panel:  Recent Labs  35/00/93 0950 08/29/16 0103  NA 146* 142  K 4.6 4.8  CL 110 109  CO2 28 28  GLUCOSE 90 110*  BUN 21* 23*  CREATININE 0.88 1.01*  CALCIUM 9.3 9.3   Liver Function Tests: No results for input(s): AST, ALT, ALKPHOS, BILITOT, PROT, ALBUMIN in the last 72 hours. No results for input(s): LIPASE, AMYLASE in the last 72 hours. CBC:  Recent Labs  08/28/16 0950 08/29/16 0103  WBC 4.7 5.3  HGB 9.9* 9.5*  HCT 33.0* 30.4*  MCV 83.8 82.4  PLT 104* 102*   Cardiac Enzymes:  Recent Labs  08/28/16 1718 08/28/16 2009 08/29/16 0103  TROPONINI <0.03 <0.03 <0.03   Hemoglobin A1C:  Recent Labs  08/28/16 1401  HGBA1C 5.5   Fasting Lipid Panel:  Recent Labs  08/28/16 1401  CHOL 139  HDL 58  LDLCALC 70  TRIG 53  CHOLHDL 2.4   Thyroid Function Tests:  Recent Labs   08/28/16 1401  TSH 5.721*   Anemia Panel:  Recent Labs  08/28/16 1401  VITAMINB12 3,942*  FOLATE 6.2  FERRITIN 26  TIBC 500*  IRON 67  RETICCTPCT 1.1    Imaging: Dg Chest 1 View  Result Date: 08/28/2016 CLINICAL DATA:  Sob for a few weeks,, Unable to do lat due to pt size /big arms unable to penetrate and unable to bring arms higher morbid obesity. EXAM: CHEST 1 VIEW COMPARISON:  None. FINDINGS: Moderately degraded exam secondary to patient body habitus and AP portable technique. Midline trachea. Cardiomegaly accentuated by AP portable technique. Moderate right hemidiaphragm elevation. No definite pleural fluid. No pneumothorax. Interstitial prominence. Left lung base not well evaluated. Otherwise, no lobar consolidation. IMPRESSION: Moderately degraded exam, as detailed above. Cardiomegaly with pulmonary interstitial prominence. Although this could be technique related, mild venous congestion cannot be excluded. Electronically Signed   By: Jeronimo Greaves M.D.   On: 08/28/2016 09:39   Portable Chest 1 View  Result Date: 08/29/2016 CLINICAL DATA:  Shortness of breath. EXAM: PORTABLE CHEST 1  VIEW COMPARISON:  08/28/2016. FINDINGS: Cardiomegaly with pulmonary vascular prominence and bilateral interstitial prominence consistent congestive heart failure. Small left pleural effusion. No pneumothorax. IMPRESSION: Cardiomegaly with pulmonary vascular prominence and bilateral interstitial prominence consistent with congestive heart failure. Findings progressed from prior exam . Electronically Signed   By: Maisie Fus  Register   On: 08/29/2016 07:23    Cardiac Studies:  ECG:  NSR no acute ST/T wave changes    Telemetry:  NSR   Echo: preliminary to my review EF is normal 60-65%   Medications:   . albuterol  2.5 mg Nebulization QID  . aspirin EC  81 mg Oral Daily  . cholecalciferol  1,000 Units Oral Daily  . cyanocobalamin  500 mcg Oral Daily  . enoxaparin (LOVENOX) injection  0.5 mg/kg  Subcutaneous Q24H  . furosemide  40 mg Intravenous BID  . Glecaprevir-Pibrentasvir  3 tablet Oral Q breakfast      Assessment/Plan:  Dyspnea:  From morbid obesity OSA and restrictive lung disease ? Component diastolic dysfucntion Preliminary echo with normal EF. Continue diuresis. No need or ability to do further cardiac w/u  Will sign off  Edema: Obesity with venous disease diuretic consider home DVT prophylaxis given non ambulatory state  Charlton Haws 08/29/2016, 9:23 AM

## 2016-08-29 NOTE — Care Management Note (Signed)
Case Management Note  Patient Details  Name: Tishana Era MRN: [id number] Date of Birth: [birth year]  Subjective/Objective:    Admitted with CHF                Action/Plan: Patient is wheelchair bound at home; has a personal care service that provides care ( nursing assistant) 35 hr a week; Primary Care Provider: Norm Salt, PA- she is with [facility] and the physician make monthly home visits. She has been in this program for 6 months; New onset CHF, patient could benefit from a Disease Management Program for CHF; HHC choice offered pt chose [facility]; [place] with [place] called for arrangements; She use [facility] for transportation; pharmacy of choice [facility]  and they deliver her medication to her home; CM will continue to follow for DCP.  Expected Discharge Date:     Possibly 09/03/2016             Expected Discharge Plan:  Home w Home Health Services  Discharge planning Services  CM Consult  Choice offered to:  Patient  HH Arranged:  RN [facility] Agency:  [facility]  Status of Service:  In process, will continue to follow  [name] [phone] 08/29/2016, 11:20 AM

## 2016-08-29 NOTE — Evaluation (Addendum)
Physical Therapy Evaluation Patient Details Name: Zoe Cobb MRN: [id number] DOB: February 17, 1956 Today's Date: 08/29/2016   History of Present Illness  61 y/o female with PMH Morbid Obesity, Hypertension, and Hepatitis C presents with exertional dyspnea, orthopnea, and increased edema. Clinically consistent and admitted with new onset CHF  Clinical Impression  Patient demonstrates deficits in functional mobility as indicated below. Will need continued skilled PT to address deficits and maximize function. Will see as indicated and progress as tolerated.   OF NOTE: During session, patient noted to have defective air mattress. Portable equipment notified and bringing new bed for patient. Limited assessment, plan for return visit once new bed is brought for patient. Nsg aware.    Follow Up Recommendations Supervision/Assistance - 24 hour    Equipment Recommendations  Hospital bed [facility] lift, new hospital bed as current bed is broken)    Recommendations for Other Services       Precautions / Restrictions Precautions Precautions: Fall Precaution Comments: w/c bound, does not ambulate. Transfers from bed to w/c only Restrictions Weight Bearing Restrictions: Yes Other Position/Activity Restrictions: Does not stand or ambulate per pt and chart review      Mobility  Bed Mobility Overal bed mobility: Needs Assistance Bed Mobility: Rolling Rolling: +2 for physical assistance;Max assist         General bed mobility comments: Max assist dur to increased body habitus and patients limited ROM at this time.  Transfers                    Ambulation/Gait                Stairs            Wheelchair Mobility    Modified Rankin (Stroke Patients Only)       Balance                                             Pertinent Vitals/Pain Pain Assessment: Faces Faces Pain Scale: Hurts little more Pain Location: bilateral legs and buttocks Pain  Descriptors / Indicators: Aching Pain Intervention(s): Monitored during session    Home Living Family/patient expects to be discharged to:: Private residence Living Arrangements: Alone Available Help at Discharge: Family;Available PRN/intermittently;Personal care attendant (CNA 35 hours per week) Type of Home: House Home Access: Ramped entrance     Home Layout: One level Home Equipment: Wheelchair - power;Hand held shower head      Prior Function Level of Independence: Needs assistance   Gait / Transfers Assistance Needed: Does not ambulate of stnad. Transfers from bed to power w/c only  ADL's / Homemaking Assistance Needed: Sister prepares all meals and assists with homemaking. 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 for ADL's        Hand Dominance   Dominant Hand: Right    Extremity/Trunk Assessment   Upper Extremity Assessment Upper Extremity Assessment: Generalized weakness    Lower Extremity Assessment Lower Extremity Assessment: Generalized weakness (BLE limited ROM, difficult to assess due to edema)       Communication   Communication: No difficulties  Cognition Arousal/Alertness: Awake/alert Behavior During Therapy: WFL for tasks assessed/performed Overall Cognitive Status: Within Functional Limits for tasks assessed                      General Comments  Exercises     Assessment/Plan    PT Assessment Patient needs continued PT services  PT Problem List Decreased strength;Decreased range of motion;Decreased activity tolerance;Decreased balance;Decreased mobility;Cardiopulmonary status limiting activity;Obesity;Pain          PT Treatment Interventions Functional mobility training;Therapeutic activities;Therapeutic exercise;Balance training;Neuromuscular re-education;Patient/family education    PT Goals (Current goals can be found in the Care Plan section)  Acute Rehab PT Goals Patient Stated Goal: Go home after this fluid gets  off PT Goal Formulation: With patient Time For Goal Achievement: 09/12/16 Potential to Achieve Goals: Fair    Frequency Min 2X/week   Barriers to discharge        Co-evaluation               End of Session   Activity Tolerance: Patient limited by fatigue Patient left: in bed;with call bell/phone within reach Nurse Communication: Mobility status    Functional Assessment Tool Used: clinical judgement Functional Limitation: Changing and maintaining body position Changing and Maintaining Body Position Current Status (Z6109): At least 60 percent but less than 80 percent impaired, limited or restricted Changing and Maintaining Body Position Goal Status (U0454): At least 40 percent but less than 60 percent impaired, limited or restricted    Time: 1121-1139 PT Time Calculation (min) (ACUTE ONLY): 18 min   Charges:   PT Evaluation $PT Eval Moderate Complexity: 1 Procedure     PT G Codes:   PT G-Codes **NOT FOR INPATIENT CLASS** Functional Assessment Tool Used: clinical judgement Functional Limitation: Changing and maintaining body position Changing and Maintaining Body Position Current Status (U9811): At least 60 percent but less than 80 percent impaired, limited or restricted Changing and Maintaining Body Position Goal Status (B1478): At least 40 percent but less than 60 percent impaired, limited or restricted    Fabio Asa 08/29/2016, 2:18 PM Charlotte Crumb, PT DPT  [phone]

## 2016-08-29 NOTE — Progress Notes (Signed)
  Echocardiogram 2D Echocardiogram with Definity has been performed.  ,  08/29/2016, 10:12 AM

## 2016-08-29 NOTE — Progress Notes (Signed)
Family Medicine Teaching Service Daily Progress Note Intern Pager: [phone]  Patient name: Zoe Cobb Medical record number: [id number] Date of birth: 02/19/1956 Age: 61 y.o. Gender: female  Primary Care Provider: Norm Salt, PA Consultants: Cardiology Code Status: Full  Assessment and Plan: Merrianne Mozer is a 61 y.o. female presenting with shortness of breath. PMH is significant for morbid obesity, HTN, Hepatitis B and C.   Dypnea Likely 2/2 CHF Exacerbation: Improving subjectively and objectively but still with some increased work of breathing and wheeze, which could be cardiac wheeze. Good response to lasix. Good urine output. But creatinine bumped up from 0.88 to 1.01. We have no dry weight on her. Echo with EF of 55-60%,  Mod LAE, G2DD and PAH to 42.Others contributing factors  to her dyspnea are OHS and OSA given her morbid obesity. ACS ruled out by serial trop and EKG. - Continue aspirin 81 mg PO QD. - Switch to Lasix from IV to PO 40 mg BID given slight bump on creatinine - Continue nebulized albuterol Q4H as needed - Check daily weights. - Strict I's and O's. - Daily BMP. - CPAP QHS for likely OSA. - Will need outpatient sleep study and PFTs.  - PT eval. patient lives by herself. Has HHRN. She is wheelchair-bound.   HFpEF: Exacerbation is improving. Echo with EF of 55-60%,  Mod LAE, G2DD and PAH to 42. - Cardiology has signed off and recommended further diuresis. - Lasix as above.  Hypertension: Stable and normotensive at this time. On losartan 25 and chlorthalidone 50 mg dialy.  - Holding home medication given increased creatinine - We will consider changing chlorthalidone to furosemide on discharge given diastolic CHF  Increased TSH: TSH was found to be elevated to 5.721 and this could be hypothyroidism or could be response to acute illness. - We'll check free T4 in the morning - Recommend repeat TSH in 3-4 weeks  Thrombocytopenia: Unknown baseline, but stable  with platelets at 102 from 104 yesterday. Wondering if this sequestration due to her underlying liver disease. - On Lovenox for DVT prophylaxis, considering stopping prophylaxis if thrombocytopenia worsens. - Daily CBC.  Normocytic anemia: Stable with hemoglobin of 9.5 and MCV of 82.4. Labs significant for increased TIBC but normal ferritin and iron levels. Reticulocyte count is normal.  - Daily CBC. - Discontinue home B12.  Hepatitis B and C: Patient reports having hepatitis B and C. She is currently being treated for her hepatitis C  - Continue home medication (Mavyret). - CMP in the morning  FEN/GI: Carb modified/heart healthy diet, no IV fluids.  Prophylaxis: Lovenox.  Disposition: Continue telemetry pending clinical improvement and PT eval  Subjective:  She states her shortness of breath that brought her to the hospital yesterday has improved overall from yesterday and that it is still worsened when she is talking a lot. Otherwise, she did have some transient chest pain described as a "like a tooth ache" over her central chest this morning that later went away without any further intervention. She attributed this pain to being put on CPAP last night.   Objective: Temp:  [96.4 F (35.8 C)-98.2 F (36.8 C)] 98.2 F (36.8 C) (01/23 0740) Pulse Rate:  [62-75] 72 (01/23 0740) Resp:  [18-22] 18 (01/23 0800) BP: (93-113)/(50-67) 112/61 (01/23 0740) SpO2:  [90 %-100 %] 97 % (01/23 0800) Weight:  [224.5 kg (495 lb)-227.7 kg (502 lb)] 224.5 kg (495 lb) (01/23 0431) Physical Exam: General: Morbidly obese female laying in bed in no apparent  distress.  Cardiovascular: Regular rate and rhythm without any appreciable murmurs, rubs or gallops. Respiratory: some increased work of breathing with scattered wheezes heard throughout her lungs.  Abdomen: Morbidly obese abdomen with no tenderness to palpation.  Extremities: Lower extremity edema is improving today.   Laboratory:  Recent  Labs Lab 08/28/16 0950 08/29/16 0103  WBC 4.7 5.3  HGB 9.9* 9.5*  HCT 33.0* 30.4*  PLT 104* 102*    Recent Labs Lab 08/28/16 0950 08/29/16 0103  NA 146* 142  K 4.6 4.8  CL 110 109  CO2 28 28  BUN 21* 23*  CREATININE 0.88 1.01*  CALCIUM 9.3 9.3  GLUCOSE 90 110*    Troponin series negative B12 is elevated at 3942 Folate 6.2 Retic Ct 1.1 Ferritin 26 Iron 67 TIBC 500 Saturation 13 Hgb A1c 5.5 Cholesterol 139 Triglycerides 53 HDL 58 LDL 70 TSH 5.721  Imaging/Diagnostic Tests: Echocardiogram Study Conclusions  - Left ventricle: The cavity size was mildly dilated. Wall   thickness was increased in a pattern of mild LVH. Systolic   function was normal. The estimated ejection fraction was in the   range of 55% to 60%. Wall motion was normal; there were no   regional wall motion abnormalities. Features are consistent with   a pseudonormal left ventricular filling pattern, with concomitant   abnormal relaxation and increased filling pressure (grade 2   diastolic dysfunction). - Mitral valve: Calcified annulus. - Left atrium: The atrium was moderately dilated. - Right atrium: The atrium was mildly dilated. - Pulmonary arteries: Systolic pressure was mildly increased. PA   peak pressure: 42 mm Hg (S).  Impressions:  - Technically difficult; definity used; normal LV systolic   function; grade 2 diastolic dysfunction; moderate LAE; mild RAE;   mild TR with mildly elevated pulmonary pressure.  John Giovanni, MS4 08/29/2016, 9:53 AM [facility] FPTS Intern pager: [phone], text pages welcome  I have seen and evaluated the patient with Student Dr. Alinda Money. I am in agreement with the note above in its revised form.   Candelaria Stagers, MD, PGY-2 08/29/2016 2:38 PM

## 2016-08-29 NOTE — Discharge Summary (Signed)
Family Medicine Teaching [facility] Discharge Summary  Patient name: Zoe Cobb Medical record number: [id number] Date of birth: Oct 23, 1955 Age: 61 y.o. Gender: female Date of Admission: 08/28/2016  Date of Discharge: 08/30/16 Admitting Physician: Doreene Eland, MD  Primary Care Provider: Norm Salt, PA Consultants: Cardiology, PT/OT, Nutrition  Indication for Hospitalization: Dyspnea  Discharge Diagnoses/Problem List:  Patient Active Problem List   Diagnosis Date Noted  . CHF (congestive heart failure) (HCC) 08/28/2016  . Shortness of breath 08/28/2016  . Pressure injury of skin 08/28/2016  . Morbid obesity due to excess calories (HCC) 06/28/2015   Disposition: To home with Home Health Aide and PT  Discharge Condition: Stable  Discharge Exam: BP 128/66 (BP Location: Right Arm)   Pulse 71   Temp 97.8 F (36.6 C) (Oral)   Resp 20   Ht 5\' 1"  (1.549 m)   Wt (!) 480 lb (217.7 kg) Comment: bedscale  SpO2 96%   BMI 90.70 kg/m    GEN: Morbidly obese, no apparent distress, pleasant CVS: RRR, nl s1 & s2, 2/6 SEM more over LUSB, trace edema, RESP: speaks in full sentence, no IWOB, good air movement anteriorly, CTAB anteriorly GI: morbidly obese, BS present & normal, soft, NTND MSK: no focal tenderness or notable swelling NEURO: alert and oiented appropriately, no gross defecits  PSYCH: euthymic mood with congruent affect   Brief Hospital Course:  Corvette Orser is a 61 y.o. female presenting with shortness of breath secondary to HFpEF and underlying lung pathology as described below. PMH is significant for morbid obesity, HTN, Hepatitis B and C.   Dyspnea likely CHF exacerbation: exam was remarkable for 2+ pitting edema and wheezing (likely cardiac wheeze). BNP elevated to 220, which could be deceiving given her morbid obesity. Chest x-ray with cardiomegaly and pulmonary interstitial prominence suggestive for CHF. ACS was ruled out by EKG and serial troponin. Other  possible contributing factors to his dyspnea are obesity hypoventilation syndrome secondary to her body habitus and possible sleep apnea. She did not have a significant history of tobacco abuse nor a history of COPD so this was thought to be less likely.  She was diuresed with Lasix 40 mg IV BID and showed good response. She was transitioned to oral Lasix 40 mg twice a day on the second day. Her weight trended down from 500 lbs to 480 lbs. unfortunately, we have no dry weight for her.  On the day of discharge, she felt much better and at baseline. Echocardiogram on 08/28/2016 with EF of 55-60%,  Mod LAE, G2DD and PAH to (see below for more detail).   Patient's home chlorthalidone and losartan were held during this admission. Her blood pressure was well controlled with Lasix alone. So patient was discharged on oral Lasix 40 mg twice a day. Recommend checking a BMP and resuming her home blood pressure medications as necessary.   Patient was evaluated by physical therapy prior to discharge. Physical therapy evaluated the patient and recommended HHPT.  Accordingly, home health PT was ordered. Heavy duty bariatric hospital bed was ordered prior to discharge to replace her hospital bed at home which is very old and broken per patient.  The rest of her chronic medical problems were stable.   Issues for Follow Up:  1. CHF: Assess fluid status. Check BMP.  2. Hypertension: discontinued losartan and chlorthalidone. Can consider resuming as needed 3. Consider outpatient sleep study. She may benefit from CPAP  Significant Procedures: Echocardiogram.   Significant Labs and Imaging:  Recent Labs Lab 08/28/16 0950 08/29/16 0103 08/30/16 0522  WBC 4.7 5.3 6.3  HGB 9.9* 9.5* 10.1*  HCT 33.0* 30.4* 31.8*  PLT 104* 102* 103*    Recent Labs Lab 08/28/16 0950 08/29/16 0103 08/30/16 0522  NA 146* 142 145  K 4.6 4.8 4.7  CL 110 109 106  CO2 28 28 29   GLUCOSE 90 110* 94  BUN 21* 23* 25*   CREATININE 0.88 1.01* 1.10*  CALCIUM 9.3 9.3 9.5  ALKPHOS  --   --  110  AST  --   --  29  ALT  --   --  17  ALBUMIN  --   --  2.8*   T4 1.04  Echocardiogram: Study Conclusions  - Left ventricle: The cavity size was mildly dilated. Wall   thickness was increased in a pattern of mild LVH. Systolic   function was normal. The estimated ejection fraction was in the   range of 55% to 60%. Wall motion was normal; there were no   regional wall motion abnormalities. Features are consistent with   a pseudonormal left ventricular filling pattern, with concomitant   abnormal relaxation and increased filling pressure (grade 2   diastolic dysfunction). - Mitral valve: Calcified annulus. - Left atrium: The atrium was moderately dilated. - Right atrium: The atrium was mildly dilated. - Pulmonary arteries: Systolic pressure was mildly increased. PA   peak pressure: 42 mm Hg (S).  Impressions:  - Technically difficult; definity used; normal LV systolic   function; grade 2 diastolic dysfunction; moderate LAE; mild RAE;   mild TR with mildly elevated pulmonary pressure  Results/Tests Pending at Time of Discharge: None  Discharge Medications:  Allergies as of 08/30/2016      Reactions   Penicillins Swelling   Has patient had a PCN reaction causing immediate rash, facial/tongue/throat swelling, SOB or lightheadedness with hypotension: Yes Has patient had a PCN reaction causing severe rash involving mucus membranes or skin necrosis: No Has patient had a PCN reaction that required hospitalization No Has patient had a PCN reaction occurring within the last 10 years: No If all of the above answers are "NO", then may proceed with Cephalosporin use.      Medication List    STOP taking these medications   aspirin 325 MG tablet Replaced by:  aspirin 81 MG EC tablet   chlorthalidone 25 MG tablet Commonly known as:  HYGROTON   cyanocobalamin 500 MCG tablet   losartan 25 MG  tablet Commonly known as:  COZAAR     TAKE these medications   aspirin 81 MG EC tablet Take 1 tablet (81 mg total) by mouth daily. Start taking on:  08/31/2016 Replaces:  aspirin 325 MG tablet   cholecalciferol 1000 units tablet Commonly known as:  VITAMIN D Take 1,000 Units by mouth daily.   furosemide 40 MG tablet Commonly known as:  LASIX Take 1 tablet (40 mg total) by mouth 2 (two) times daily.   MAVYRET 100-40 MG Tabs Generic drug:  Glecaprevir-Pibrentasvir Take 3 tablets by mouth daily.   polyethylene glycol packet Commonly known as:  MIRALAX / GLYCOLAX Take 17 g by mouth daily as needed for mild constipation.            Durable Medical Equipment        Start     Ordered   08/30/16 1206  For home use only DME Hospital bed  Once    Comments:  Bariatric Bed  Question Answer Comment  Patient has (list medical condition): Dypnea Likely 2/2 CHF Exacerbation   The above medical condition requires: Patient requires the ability to reposition frequently   Head must be elevated greater than: 45 degrees   Bed type Heavy-duty, semi-electric (for patients >350 lbs.)   [employer] Yes   Trapeze Bar Yes   Support Surface: Gel Overlay      08/30/16 1206      Discharge Instructions: Please refer to Patient Instructions section of EMR for full details.  Patient was counseled important signs and symptoms that should prompt return to medical care, changes in medications, dietary instructions, activity restrictions, and follow up appointments.   Follow-Up Appointments: Follow-up Information    Schedule an appointment as soon as possible for a visit with VANSTORY, ASHLEY N, PA.   Specialty:  Physician Assistant Why:  hospital follow upLeft message office to call Contact information: [address] [place] [address] [phone]           John Giovanni, MS4 08/30/2016, 3:43 PM  Family Medicine  I have seen and evaluated the patient with Student  Dr. Alinda Money. I am in agreement with the note above in its revised form.  Candelaria Stagers, MD, PGY-2 08/30/2016 3:44 PM

## 2016-08-29 NOTE — Progress Notes (Signed)
Physical Therapy Treatment Patient Details Name: Zoe Cobb MRN: [id number] DOB: Oct 02, 1955 Today's Date: 08/29/2016    History of Present Illness 61 y/o female with PMH Morbid Obesity, Hypertension, and Hepatitis C presents with exertional dyspnea, orthopnea, and increased edema. Clinically consistent and admitted with new onset CHF    PT Comments    Patient seen for follow up session for transfer to new bed. Attempted transfer to chair x3, unable to perform (patient has not stood on legs in 10+ years per patient. Multiple attempts and variations of transfers trialed but unsuccessful, resulted in draw sheet maxislide transfer from defective air mattress to new air mattress with increased physical assist +3 persons. At this time, feel patient will benefit from use of hoyer lift upon acute discharge and HHPT if able. Will follow acutely and progress as tolerated.   Follow Up Recommendations  Supervision/Assistance - 24 hour (HHPT would be beneficial if eligible)     Equipment Recommendations  Hospital bed [facility] lift, new hospital bed as current bed is broken)    Recommendations for Other Services       Precautions / Restrictions Precautions Precautions: Fall Precaution Comments: w/c bound, does not ambulate. Transfers from bed to w/c only Restrictions Weight Bearing Restrictions: Yes Other Position/Activity Restrictions: Does not stand or ambulate per pt and chart review    Mobility  Bed Mobility Overal bed mobility: Needs Assistance Bed Mobility: Rolling;Supine to Sit;Sit to Supine Rolling: +2 for physical assistance;Max assist   Supine to sit: Max assist;+2 for physical assistance Sit to supine: Max assist;+2 for physical assistance   General bed mobility comments: +2 max assist to perform all aspects of bed mobility. Patient was able to use UE to pull to sit with max assist and posterior support, increased assist to scoot to EOB.   Transfers Overall transfer level: Needs  assistance   Transfers: Lateral/Scoot Transfers          Lateral/Scoot Transfers: +2 physical assistance;+2 safety/equipment;From elevated surface (+3 assist using maxi slide sheets and draw sheet technique) General transfer comment: attempted transfer via stand pivot unable to perform due to contractures noted in BLES and increased boday habitus. Reattempted using maxi move. Despite multiple attempts to roll and position, patient could not safely be positioned within sling for transfer. Patient then assisted onto maxi slide sheet and 3 person draw sheet slide transfer was performed from air mattress of defective bed to air matttress of new bed. Patient tolerated well. Increased time and assist required.   Ambulation/Gait                 Stairs            Wheelchair Mobility    Modified Rankin (Stroke Patients Only)       Balance Overall balance assessment: Needs assistance Sitting-balance support: Bilateral upper extremity supported Sitting balance-Leahy Scale: Poor Sitting balance - Comments: difficulty maintain balance at EOB, posterior assist provided. Once able to reposition closer to EOB (3 person assist) patient then able to maintain static sitting with min guard assist Postural control: Posterior lean                          Cognition Arousal/Alertness: Awake/alert Behavior During Therapy: WFL for tasks assessed/performed Overall Cognitive Status: Within Functional Limits for tasks assessed                      Exercises      General Comments  Pertinent Vitals/Pain Pain Assessment: Faces Faces Pain Scale: Hurts even more Pain Location: buttocks Pain Descriptors / Indicators: Sore Pain Intervention(s): Monitored during session;Repositioned    Home Living Family/patient expects to be discharged to:: Private residence Living Arrangements: Alone;Other (Comment) (24 hour care) Available Help at Discharge: Family;Available  PRN/intermittently;Personal care attendant (CNA 35 hours per week) Type of Home: House Home Access: Ramped entrance   Home Layout: One level Home Equipment: Wheelchair - power;Hand held shower head      Prior Function Level of Independence: Needs assistance  Gait / Transfers Assistance Needed: Does not ambulate of stnad. Transfers from bed to power w/c only ADL's / Homemaking Assistance Needed: Sister prepares all meals and assists with homemaking. 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 for ADL's     PT Goals (current goals can now be found in the care plan section) Acute Rehab PT Goals Patient Stated Goal: Go home after this fluid gets off PT Goal Formulation: With patient Time For Goal Achievement: 09/12/16 Potential to Achieve Goals: Fair Progress towards PT goals: Progressing toward goals    Frequency    Min 2X/week      PT Plan Current plan remains appropriate    Co-evaluation             End of Session   Activity Tolerance: Patient limited by fatigue Patient left: in bed;with call bell/phone within reach     Time: 1246-1328 PT Time Calculation (min) (ACUTE ONLY): 42 min  Charges:  $Therapeutic Activity: 38-52 mins                     Fabio Asa 08/29/2016, 2:26 PM Charlotte Crumb, PT DPT  [phone]

## 2016-08-29 NOTE — Progress Notes (Signed)
Nutrition Brief Note  RD consulted for diet education.   Wt Readings from Last 15 Encounters:  08/29/16 (!) 495 lb (224.5 kg)    Body mass index is 93.53 kg/m. Patient meets criteria for Extreme Obesity Class III based on current BMI.   RD discussed the benefit that nutritious eating can have on chronic medical conditions, especially heart health. Pt states that she understands the benefit of healthy eating and  has been making an effort to eat a healthy diet. Pt demonstrates good understanding of nutrition. She reports increasing intake of vegetables and fruits and eliminating bread from her diet. She eats egg whites, chicken, fish, and ground [place] and reports eating red meat only once a month. She doesn't add salt to her food and has been making an effort to eat lower sodium snacks such as popcorn made on the stove. She states that her sister assists her with grocery shopping and food preparation which keeps her from being tempted by less healthy foods such as Fritos. RD discussed sodium guidelines and encouraged patient to check nutrition labels as even low calorie "healthy" canned foods can have a lot of added salt. Pt denies any additional questions or nutrition education needs at this time. RD encouraged pt to keep up with her healthy eating habits, especially eating more vegetables.   Lab Results  Component Value Date   HGBA1C 5.5 08/28/2016   Lab Results  Component Value Date   CHOL 139 08/28/2016   HDL 58 08/28/2016   LDLCALC 70 08/28/2016   TRIG 53 08/28/2016   CHOLHDL 2.4 08/28/2016    Current diet order is Heart Healthy/Carb Modified, patient is consuming approximately 100% of meals at this time. Labs and medications reviewed.   No additonal nutrition interventions warranted at this time. If nutrition issues arise, please consult RD.   Dorothea Ogle RD, CSP, LDN Inpatient Clinical Dietitian Pager: [phone] After Hours Pager: [phone]

## 2016-08-29 NOTE — Progress Notes (Signed)
Transitions of Care Pharmacy Note  Plan:  Educated on the following: - lasix indication - Lifestyle modifications, including sodium restriction and daily weight monitoring - Signs and symptoms of iron deficiency anemia - Discontinuing the vitamin B12 - Potentially having the TSH rechecked outpatient - Taking her Hepatitis C regimen with food, as well as the importance of following up with her physician and reporting any new medications.  Recommend repleting low TSAT with IV Iron - already ordered by physician  Follow-up with family medicine if you have any questions --------------------------------------------- Zoe Cobb is an 61 y.o. female who presents with a chief complaint of SOB. In anticipation of discharge, pharmacy has reviewed this patient's prior to admission medication history, as well as current inpatient medications listed per the [facility].  Current medication indications, dosing, frequency, and notable side effects reviewed with patient. patient verbalized understanding of current inpatient medication regimen and is aware that the After Visit Summary when presented, will represent the most accurate medication list at discharge.   Estha Maric expressed no concerns. She was highly attentive and appreciate the education provided.  Assessment: Understanding of regimen: excellent Understanding of indications: good Potential of compliance: excellent Barriers to Obtaining Medications: No  Patient instructed to contact inpatient pharmacy team with further questions or concerns if needed.    Time spent preparing for discharge counseling: 30 minutes Time spent counseling patient: 30 minutes   Thank you for allowing pharmacy to be a part of this patient's care.  Alfredo Bach, Cleotis Nipper, PharmD Clinical Pharmacy Resident [phone] (Pager) 08/29/2016 5:21 PM

## 2016-08-29 NOTE — Progress Notes (Signed)
Patient Name: Zoe Cobb Date of Encounter: 08/29/2016  Primary Cardiologist: [facility] Problem List     Active Problems:   Morbid obesity due to excess calories (HCC)   CHF (congestive heart failure) (HCC)   Shortness of breath   Pressure injury of skin     Subjective   Had some pain in her chest that felt like a toothache when she was wearing CPAP last night. No complaints currently. ECHO team walking in room to do echo  Inpatient Medications    Scheduled Meds: . albuterol  2.5 mg Nebulization QID  . aspirin EC  81 mg Oral Daily  . cholecalciferol  1,000 Units Oral Daily  . cyanocobalamin  500 mcg Oral Daily  . enoxaparin (LOVENOX) injection  0.5 mg/kg Subcutaneous Q24H  . furosemide  40 mg Intravenous BID  . Glecaprevir-Pibrentasvir  3 tablet Oral Q breakfast   Continuous Infusions:  PRN Meds: acetaminophen **OR** acetaminophen, albuterol, polyethylene glycol   Vital Signs    Vitals:   08/29/16 0431 08/29/16 0724 08/29/16 0740 08/29/16 0800  BP: (!) 105/58 109/63 112/61   Pulse: 73 72 72   Resp: 18 18  18   Temp: 97.3 F (36.3 C) 97.6 F (36.4 C) 98.2 F (36.8 C)   TempSrc: Oral Oral Oral   SpO2: 98% 97%  97%  Weight: (!) 495 lb (224.5 kg)     Height:        Intake/Output Summary (Last 24 hours) at 08/29/16 2703 Last data filed at 08/29/16 0720  Gross per 24 hour  Intake              240 ml  Output             1100 ml  Net             -860 ml   Filed Weights   08/28/16 0848 08/28/16 1714 08/29/16 0431  Weight: (!) 500 lb (226.8 kg) (!) 502 lb (227.7 kg) (!) 495 lb (224.5 kg)    Physical Exam   General: Well developed, well nourished, female in no acute distress, morbidly obese Head: Eyes PERRLA, No xanthomas.   Normocephalic and atraumatic, oropharynx without edema or exudate. Lungs: expiratory wheezing  Heart: HRRR S1 S2, no rub/gallop, Heart irregular rate and rhythm with S1, S2  No murmur. pulses are 2+ extrem.   Neck: No carotid  bruits. No lymphadenopathy. JVD diffuclt to assess given body habitus. Abdomen: Bowel sounds present, abdomen soft and non-tender without masses or hernias noted. Msk:  No spine or cva tenderness. No weakness, no joint deformities or effusions. Extremities: No clubbing or cyanosis.  2+ LE edema.  Neuro: Alert and oriented X 3. No focal deficits noted. Psych:  Good affect, responds appropriately Skin: No rashes or lesions noted. .  Labs    CBC  Recent Labs  08/28/16 0950 08/29/16 0103  WBC 4.7 5.3  HGB 9.9* 9.5*  HCT 33.0* 30.4*  MCV 83.8 82.4  PLT 104* 102*   Basic Metabolic Panel  Recent Labs  08/28/16 0950 08/29/16 0103  NA 146* 142  K 4.6 4.8  CL 110 109  CO2 28 28  GLUCOSE 90 110*  BUN 21* 23*  CREATININE 0.88 1.01*  CALCIUM 9.3 9.3   Liver Function Tests No results for input(s): AST, ALT, ALKPHOS, BILITOT, PROT, ALBUMIN in the last 72 hours. No results for input(s): LIPASE, AMYLASE in the last 72 hours. Cardiac Enzymes  Recent Labs  08/28/16 1718 08/28/16  2009 08/29/16 0103  TROPONINI <0.03 <0.03 <0.03   BNP Invalid input(s): POCBNP D-Dimer No results for input(s): DDIMER in the last 72 hours. Hemoglobin A1C  Recent Labs  08/28/16 1401  HGBA1C 5.5   Fasting Lipid Panel  Recent Labs  08/28/16 1401  CHOL 139  HDL 58  LDLCALC 70  TRIG 53  CHOLHDL 2.4   Thyroid Function Tests  Recent Labs  08/28/16 1401  TSH 5.721*    Telemetry    Sinus tach with PVCs and sinus pauses - Personally Reviewed  ECG    NSR HR 66 - Personally Reviewed  Radiology    Dg Chest 1 View  Result Date: 08/28/2016 CLINICAL DATA:  Sob for a few weeks,, Unable to do lat due to pt size /big arms unable to penetrate and unable to bring arms higher morbid obesity. EXAM: CHEST 1 VIEW COMPARISON:  None. FINDINGS: Moderately degraded exam secondary to patient body habitus and AP portable technique. Midline trachea. Cardiomegaly accentuated by AP portable  technique. Moderate right hemidiaphragm elevation. No definite pleural fluid. No pneumothorax. Interstitial prominence. Left lung base not well evaluated. Otherwise, no lobar consolidation. IMPRESSION: Moderately degraded exam, as detailed above. Cardiomegaly with pulmonary interstitial prominence. Although this could be technique related, mild venous congestion cannot be excluded. Electronically Signed   By: Jeronimo Greaves M.D.   On: 08/28/2016 09:39   Portable Chest 1 View  Result Date: 08/29/2016 CLINICAL DATA:  Shortness of breath. EXAM: PORTABLE CHEST 1 VIEW COMPARISON:  08/28/2016. FINDINGS: Cardiomegaly with pulmonary vascular prominence and bilateral interstitial prominence consistent congestive heart failure. Small left pleural effusion. No pneumothorax. IMPRESSION: Cardiomegaly with pulmonary vascular prominence and bilateral interstitial prominence consistent with congestive heart failure. Findings progressed from prior exam . Electronically Signed   By: Maisie Fus  Register   On: 08/29/2016 07:23    Cardiac Studies   Echo pending  Patient Profile     Zoe Cobb is a 61 y.o. female with a history of super morbid obesity, Hep B/C, HTN and wheelchair bound who presented to [facility] ED on 08/28/16 with worsening LE edema and SOB with wheezing.   Assessment & Plan    SOB: 2D ECHO pending. BNP mildly elevated (felt not to be significant). CXR with possible pulmonary vascular congestion. No objective evidence of CHF. LE edema felt to be from venous disease and bed bound status. Started on lasix 40mg  BID. Net neg 860 mL. Weight down 5 lbs. Awaiting results of 2D ECHO.   HTN: BPs have been soft. Continue to monitor.  Super morbid obesity: Body mass index is 94.47 kg/m. Diet and weight loss recommended. She likely has undiagnosed OHS/OSA and sleep study can be set up as an outpatient. Wore CPAP last night.   Hep C: continue Mavyret  Signed, Cline Crock, PA-C  08/29/2016, 8:52 AM   See my  separate note Charlton Haws

## 2016-08-29 NOTE — Evaluation (Signed)
Occupational Therapy Evaluation Patient Details Name: Zoe Cobb MRN: [id number] DOB: [birth year] Today's Date: 08/29/2016    History of Present Illness 61 y/o female with PMH Morbid Obesity, Hypertension, and Hepatitis C presents with exertional dyspnea, orthopnea, and increased edema. Clinically consistent and admitted with new onset CHF   Clinical Impression   t is currently at baseline level, she does not ambulate or stand. Only transfers from bed to power w/c and back. She has a sister whom prepares all meals and performs grocery shopping and other household tasks as needed. She participated in ADL retraining session for grooming bed level and was educated in role of OT and ADL's were discussed. She uses chucks (does not go into bathroom at home and sponge bathes only). She has CNA that "cleans and washes me, I don't do it". She reports that she is at baseline level of care and denies any need for further acute OT services. She plans to d/c home when able w/ previous family and CNA assist. Will sign off acute OT at this time.    Follow Up Recommendations  No OT follow up    Equipment Recommendations  Other (comment) (Pt denies need for DME)    Recommendations for Other Services       Precautions / Restrictions Precautions Precautions: Fall Precaution Comments: w/c bound, does not ambulate. Transfers from bed to w/c only Restrictions Other Position/Activity Restrictions: Does not stand or ambulate per pt and chart review      Mobility Bed Mobility                  Transfers                      Balance                                            ADL Overall ADL's : At baseline                                       General ADL Comments: Pt is currently at baseline level, she does not ambulate or stand. Only transfers from bed to power w/c and back. She has a sister whom prepares all meals and performs grocery shopping  and other household tasks as needed. She participated in ADL retraining session for grooming bed level and was educated in role of OT and ADL's were discussed. She uses chucks (does not go into bathroom at home and sponge bathes only. She has CNA that "cleans and washes me, I don't do it". She reports that she is at baseline level of care and denies any need for further acute OT services. She plans to d/c home when able w/ family and CNA assist. Will sign off acute OT at this time.     Vision  Wears glasses for reading only. No change from baseline.   Perception     Praxis      Pertinent Vitals/Pain Pain Assessment: 0-10 Pain Score: 8  Pain Descriptors / Indicators: Aching Pain Intervention(s): Limited activity within patient's tolerance;Monitored during session     Hand Dominance Right   Extremity/Trunk Assessment Upper Extremity Assessment Upper Extremity Assessment: Generalized weakness;Overall WFL for tasks assessed (AROM ~75%; PROM WNL's bilateral UE's)   Lower Extremity  Assessment Lower Extremity Assessment: Defer to PT evaluation       Communication Communication Communication: No difficulties   Cognition Arousal/Alertness: Awake/alert Behavior During Therapy: WFL for tasks assessed/performed Overall Cognitive Status: Within Functional Limits for tasks assessed                     General Comments       Exercises       Shoulder Instructions      Home Living Family/patient expects to be discharged to:: Private residence Living Arrangements: Alone Available Help at Discharge: Family;Available PRN/intermittently;Personal care attendant (CNA 35 hours per week) Type of Home: House Home Access: Ramped entrance     Home Layout: One level     Bathroom Shower/Tub: Other (comment) (Sponge bathes only)   Bathroom Toilet:  (Uses portable chucks, does not use commode) Bathroom Accessibility: Yes How Accessible: Accessible via wheelchair Home Equipment:  Wheelchair - power;Hand held shower head   Additional Comments: I don;t get in the shower      Prior Functioning/Environment Level of Independence: Needs assistance  Gait / Transfers Assistance Needed: Does not ambulate of stnad. Transfers from bed to power w/c only ADL's / Homemaking Assistance Needed: Sister prepares all meals and assists with homemaking. 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 for ADL's            OT Problem List:     OT Treatment/Interventions:      OT Goals(Current goals can be found in the care plan section) Acute Rehab OT Goals Patient Stated Goal: Go home after this fluid gets off OT Goal Formulation: All assessment and education complete, DC therapy  OT Frequency:     Barriers to D/C:            Co-evaluation              End of Session Nurse Communication: Mobility status;Other (comment) (Pt at baseline level of care related to ADL's and self care tasks.)  Activity Tolerance: Patient tolerated treatment well Patient left: in bed;with call bell/phone within reach;with nursing/sitter in room;with bed alarm set   Time: 2956-2130 OT Time Calculation (min): 26 min Charges:  OT General Charges $OT Visit: 1 Procedure OT Evaluation $OT Eval Low Complexity: 1 Procedure OT Treatments $Self Care/Home Management : 8-22 mins G-Codes:    Alm Bustard, OTR/L 08/29/2016, 8:52 AM

## 2016-08-30 DIAGNOSIS — I5030 Unspecified diastolic (congestive) heart failure: Secondary | ICD-10-CM | POA: Diagnosis not present

## 2016-08-30 LAB — CBC
HEMATOCRIT: 31.8 % — AB (ref 36.0–46.0)
Hemoglobin: 10.1 g/dL — ABNORMAL LOW (ref 12.0–15.0)
MCH: 25.9 pg — ABNORMAL LOW (ref 26.0–34.0)
MCHC: 31.8 g/dL (ref 30.0–36.0)
MCV: 81.5 fL (ref 78.0–100.0)
Platelets: 103 10*3/uL — ABNORMAL LOW (ref 150–400)
RBC: 3.9 MIL/uL (ref 3.87–5.11)
RDW: 15.8 % — ABNORMAL HIGH (ref 11.5–15.5)
WBC: 6.3 10*3/uL (ref 4.0–10.5)

## 2016-08-30 LAB — COMPREHENSIVE METABOLIC PANEL
ALT: 17 U/L (ref 14–54)
ANION GAP: 10 (ref 5–15)
AST: 29 U/L (ref 15–41)
Albumin: 2.8 g/dL — ABNORMAL LOW (ref 3.5–5.0)
Alkaline Phosphatase: 110 U/L (ref 38–126)
BILIRUBIN TOTAL: 0.8 mg/dL (ref 0.3–1.2)
BUN: 25 mg/dL — ABNORMAL HIGH (ref 6–20)
CO2: 29 mmol/L (ref 22–32)
Calcium: 9.5 mg/dL (ref 8.9–10.3)
Chloride: 106 mmol/L (ref 101–111)
Creatinine, Ser: 1.1 mg/dL — ABNORMAL HIGH (ref 0.44–1.00)
GFR, EST NON AFRICAN AMERICAN: 53 mL/min — AB (ref >60)
Glucose, Bld: 94 mg/dL (ref 65–99)
POTASSIUM: 4.7 mmol/L (ref 3.5–5.1)
Sodium: 145 mmol/L (ref 135–145)
TOTAL PROTEIN: 7.8 g/dL (ref 6.5–8.1)

## 2016-08-30 LAB — T4, FREE: FREE T4: 1.04 ng/dL (ref 0.61–1.12)

## 2016-08-30 MED ORDER — POLYETHYLENE GLYCOL 3350 17 G PO PACK: 17.0000 g | PACK | Freq: Every day | ORAL | 0 refills | Status: DC | PRN

## 2016-08-30 MED ORDER — FUROSEMIDE 40 MG PO TABS: 40.0000 mg | ORAL_TABLET | Freq: Two times a day (BID) | ORAL | 1 refills | Status: DC

## 2016-08-30 MED ORDER — ASPIRIN 81 MG PO TBEC: 81.0000 mg | DELAYED_RELEASE_TABLET | Freq: Every day | ORAL | 2 refills | Status: DC

## 2016-08-30 NOTE — Progress Notes (Signed)
RT placed patient on CPAP HS. NO O2 bleed in needed. Patient tolerating well.  

## 2016-08-30 NOTE — Progress Notes (Signed)
Transitions of Care Pharmacy Note  Plan:   Educated on - Discontinuation of home chlorthalidone and starting furosemide instead - Change in aspirin dose from 325 mg to 81 mg  - Discontinuation of losartan  - Discontinuation of cyanocobalamin  Addressed concerns regarding - obtaining her home supply of glecaprevir and pibrentasvir back from inpatient pharmacy. Pharmacy has been notified and will have her medication ready to go upon discharge.    Recommend taking all her medications as prescribed.   Follow-up - The need for losartan reinitiation. Assess as an outpatient.  - Adjustments to furosemide dose. Assess as an outpatient.    --------------------------------------------- Zoe Cobb is an 61 y.o. female who presents with a chief complaint shortness of breath. In anticipation of discharge, pharmacy has reviewed this patient's prior to admission medication history, as well as current inpatient medications listed per the [facility].  Current medication indications, dosing, frequency, and notable side effects reviewed with Patient. The Patient verbalized understanding of current inpatient medication regimen and is aware that the After Visit Summary when presented, will represent the most accurate medication list at discharge.   Zoe Cobb has not expressed concerns regarding her medication regimen.  Assessment: Understanding of regimen: excellent Understanding of indications: good Potential of compliance: good Barriers to Obtaining Medications: no  Patient instructed to contact inpatient pharmacy team with further questions or concerns if needed.    Time spent preparing for discharge counseling: 10 Time spent counseling patient: 56   Thank you for allowing pharmacy to be a part of this patient's care.  Adline Potter

## 2016-08-30 NOTE — Progress Notes (Signed)
Pt d/c to home via ambulance at 1551.  All d/c instructions explained and given to pt.  Verbalized understanding.  Amanda Pea, RN

## 2016-08-30 NOTE — Discharge Instructions (Signed)
It has been a pleasure taking care of you! You were admitted due to shortness of breath and swelling, which was likely due to your heart failure. We have treated you with a medication to remove the extra fluid off your body. With that your symptoms improved to the point we think it is safe to let you go home and follow up with your primary care doctor. We are discharging you Furosamide, which is a medication to remove more fluid off your body. There are changes made to your home medications during this hospitalization. Please, make sure to read the directions before you take them. The names and directions on how to take these medications are found on this discharge paper under medication section.  Please call your primary care office to schedule a hospital follow-up appointment as soon as possible. The address, date and time are found on the discharge paper under follow up section.  Take care,

## 2016-08-30 NOTE — Progress Notes (Signed)
Notified Dr Sydnee Cabal that pt has a foley place on Monday and if ok to d/c it prior to d/c and if she has to void before d/c.   MD instructed is ok to d/c if no void before d/c.  Amanda Pea, Charity fundraiser.

## 2016-08-30 NOTE — Clinical Social Work Note (Signed)
CSW acknowledges SNF consult and consult "patient unable to leave her home due to her body habitus." PT is recommending only Supervision and HHPT if eligible. RNCM is working with patient for home health. CSW met with patient who stated that she was not in need of any CSW resources.   CSW signing off. Consult again if any social work needs arise.  Zoe Cobb, [place]

## 2016-08-30 NOTE — Progress Notes (Signed)
Pt. Requesting med to help her have bowel movement. On call for Cardiology paged via amion. RN will inform Public relations account executive.

## 2016-08-30 NOTE — Progress Notes (Addendum)
Patient to be discharged home today. [facility] called for bariatric hosp bed to be delivered to her home. (pt has a hospital bed at home but it is very old and broken) her niece will help her at home and will meet the ambulance at her home to let her in; Home address verified ; Alexis Goodell [phone]

## 2016-08-31 NOTE — Progress Notes (Signed)
Patient's physician is Dr Dellie Burns with [facility] [phone]; CM called to make follow up apt but was informed that she makes her own apt every 28 days; CM informed them that she was discharged home yesterday with [facility] arrangements. PCP to make follow up apt; B Shelba Flake [phone]

## 2016-09-04 NOTE — Progress Notes (Signed)
OT Note - Addendum G-Codes    September 04, 2016 0855  OT Visit Information  Last OT Received On September 10, 2016  OT G-codes **NOT FOR INPATIENT CLASS**  Functional Assessment Tool Used clinical judgement  Functional Limitation Self care  Self Care Current Status (U0233) CM  Self Care Goal Status (I3568) CM  Self Care Discharge Status [phone]) CM  Luisa Dago, OT/L  For Mariam Dollar OTR/L [phone] 09-04-16

## 2016-09-11 ENCOUNTER — Telehealth: Payer: Self-pay | Admitting: *Deleted

## 2016-09-11 NOTE — Telephone Encounter (Signed)
Pt called regarding not receiving DME hospital bed.  EDCM advised to call [facility] as the order was placed prior to discharge home.

## 2016-09-13 NOTE — Progress Notes (Signed)
[facility] called about delivery of bariatric hospital bed that was ordered on 08/30/2016; Talked to [facility], he will look into the case; Alexis Goodell [phone]

## 2016-09-27 ENCOUNTER — Inpatient Hospital Stay (HOSPITAL_COMMUNITY)
Admission: EM | Admit: 2016-09-27 | Discharge: 2016-09-30 | DRG: 309 | Disposition: A | Discharge status: Home Health | Payer: Medicaid Other | Attending: Family Medicine | Admitting: Family Medicine

## 2016-09-27 ENCOUNTER — Emergency Department (HOSPITAL_COMMUNITY): Payer: Medicaid Other

## 2016-09-27 ENCOUNTER — Encounter (HOSPITAL_COMMUNITY): Payer: Self-pay | Admitting: *Deleted

## 2016-09-27 DIAGNOSIS — Z6841 Body Mass Index (BMI) 40.0 and over, adult: Secondary | ICD-10-CM

## 2016-09-27 DIAGNOSIS — I4891 Unspecified atrial fibrillation: Secondary | ICD-10-CM | POA: Diagnosis not present

## 2016-09-27 DIAGNOSIS — Z833 Family history of diabetes mellitus: Secondary | ICD-10-CM

## 2016-09-27 DIAGNOSIS — R0902 Hypoxemia: Secondary | ICD-10-CM | POA: Diagnosis present

## 2016-09-27 DIAGNOSIS — N179 Acute kidney failure, unspecified: Secondary | ICD-10-CM | POA: Diagnosis not present

## 2016-09-27 DIAGNOSIS — R0602 Shortness of breath: Secondary | ICD-10-CM | POA: Diagnosis not present

## 2016-09-27 DIAGNOSIS — I5033 Acute on chronic diastolic (congestive) heart failure: Secondary | ICD-10-CM

## 2016-09-27 DIAGNOSIS — R791 Abnormal coagulation profile: Secondary | ICD-10-CM | POA: Diagnosis present

## 2016-09-27 DIAGNOSIS — I480 Paroxysmal atrial fibrillation: Secondary | ICD-10-CM

## 2016-09-27 DIAGNOSIS — Z993 Dependence on wheelchair: Secondary | ICD-10-CM

## 2016-09-27 DIAGNOSIS — Z8249 Family history of ischemic heart disease and other diseases of the circulatory system: Secondary | ICD-10-CM

## 2016-09-27 DIAGNOSIS — M7989 Other specified soft tissue disorders: Secondary | ICD-10-CM

## 2016-09-27 DIAGNOSIS — B1920 Unspecified viral hepatitis C without hepatic coma: Secondary | ICD-10-CM | POA: Diagnosis present

## 2016-09-27 DIAGNOSIS — Z79899 Other long term (current) drug therapy: Secondary | ICD-10-CM

## 2016-09-27 DIAGNOSIS — I5032 Chronic diastolic (congestive) heart failure: Secondary | ICD-10-CM | POA: Diagnosis present

## 2016-09-27 DIAGNOSIS — Z7982 Long term (current) use of aspirin: Secondary | ICD-10-CM

## 2016-09-27 DIAGNOSIS — E662 Morbid (severe) obesity with alveolar hypoventilation: Secondary | ICD-10-CM | POA: Diagnosis present

## 2016-09-27 DIAGNOSIS — Z87891 Personal history of nicotine dependence: Secondary | ICD-10-CM

## 2016-09-27 DIAGNOSIS — I110 Hypertensive heart disease with heart failure: Secondary | ICD-10-CM | POA: Diagnosis present

## 2016-09-27 HISTORY — DX: Heart failure, unspecified: I50.9

## 2016-09-27 LAB — PROTIME-INR
INR: 1.16
Prothrombin Time: 14.9 seconds (ref 11.4–15.2)

## 2016-09-27 LAB — CREATININE, SERUM
CREATININE: 1.16 mg/dL — AB (ref 0.44–1.00)
GFR calc Af Amer: 57 mL/min — ABNORMAL LOW (ref >60)
GFR calc non Af Amer: 49 mL/min — ABNORMAL LOW (ref >60)

## 2016-09-27 LAB — BASIC METABOLIC PANEL
ANION GAP: 12 (ref 5–15)
BUN: 41 mg/dL — ABNORMAL HIGH (ref 6–20)
CHLORIDE: 103 mmol/L (ref 101–111)
CO2: 28 mmol/L (ref 22–32)
Calcium: 9.6 mg/dL (ref 8.9–10.3)
Creatinine, Ser: 1.18 mg/dL — ABNORMAL HIGH (ref 0.44–1.00)
GFR, EST AFRICAN AMERICAN: 56 mL/min — AB (ref >60)
GFR, EST NON AFRICAN AMERICAN: 48 mL/min — AB (ref >60)
Glucose, Bld: 85 mg/dL (ref 65–99)
POTASSIUM: 4.7 mmol/L (ref 3.5–5.1)
SODIUM: 143 mmol/L (ref 135–145)

## 2016-09-27 LAB — TROPONIN I

## 2016-09-27 LAB — CBC WITH DIFFERENTIAL/PLATELET
BASOS ABS: 0 10*3/uL (ref 0.0–0.1)
Basophils Relative: 0 %
EOS PCT: 1 %
Eosinophils Absolute: 0.1 10*3/uL (ref 0.0–0.7)
HEMATOCRIT: 33.7 % — AB (ref 36.0–46.0)
HEMOGLOBIN: 10.4 g/dL — AB (ref 12.0–15.0)
LYMPHS ABS: 0.9 10*3/uL (ref 0.7–4.0)
LYMPHS PCT: 18 %
MCH: 25.9 pg — ABNORMAL LOW (ref 26.0–34.0)
MCHC: 30.9 g/dL (ref 30.0–36.0)
MCV: 83.8 fL (ref 78.0–100.0)
Monocytes Absolute: 0.6 10*3/uL (ref 0.1–1.0)
Monocytes Relative: 12 %
NEUTROS ABS: 3.4 10*3/uL (ref 1.7–7.7)
NEUTROS PCT: 69 %
PLATELETS: 115 10*3/uL — AB (ref 150–400)
RBC: 4.02 MIL/uL (ref 3.87–5.11)
RDW: 17.7 % — ABNORMAL HIGH (ref 11.5–15.5)
WBC: 4.9 10*3/uL (ref 4.0–10.5)

## 2016-09-27 LAB — CBC
HCT: 31.4 % — ABNORMAL LOW (ref 36.0–46.0)
HEMOGLOBIN: 9.7 g/dL — AB (ref 12.0–15.0)
MCH: 25.9 pg — AB (ref 26.0–34.0)
MCHC: 30.9 g/dL (ref 30.0–36.0)
MCV: 84 fL (ref 78.0–100.0)
PLATELETS: 96 10*3/uL — AB (ref 150–400)
RBC: 3.74 MIL/uL — ABNORMAL LOW (ref 3.87–5.11)
RDW: 17.8 % — ABNORMAL HIGH (ref 11.5–15.5)
WBC: 4.3 10*3/uL (ref 4.0–10.5)

## 2016-09-27 LAB — D-DIMER, QUANTITATIVE: D-Dimer, Quant: 2.07 ug/mL-FEU — ABNORMAL HIGH (ref 0.00–0.50)

## 2016-09-27 LAB — HEPARIN LEVEL (UNFRACTIONATED): HEPARIN UNFRACTIONATED: 0.67 [IU]/mL (ref 0.30–0.70)

## 2016-09-27 LAB — BRAIN NATRIURETIC PEPTIDE: B NATRIURETIC PEPTIDE 5: 297.2 pg/mL — AB (ref 0.0–100.0)

## 2016-09-27 MED ORDER — SODIUM CHLORIDE 0.9 % IV SOLN
250.0000 mL | INTRAVENOUS | Status: DC | PRN
Start: 2016-09-27 — End: 2016-09-30

## 2016-09-27 MED ORDER — HEPARIN SODIUM (PORCINE) 5000 UNIT/ML IJ SOLN: 5000.0000 [IU] | Freq: Three times a day (TID) | INTRAMUSCULAR | Status: DC

## 2016-09-27 MED ORDER — HEPARIN BOLUS VIA INFUSION
5500.0000 [IU] | Freq: Once | INTRAVENOUS | Status: AC
  Administered 2016-09-27: 5500 [IU] via INTRAVENOUS
  Filled 2016-09-27: qty 5500

## 2016-09-27 MED ORDER — HEPARIN (PORCINE) IN NACL 100-0.45 UNIT/ML-% IJ SOLN
1500.0000 [IU]/h | INTRAMUSCULAR | Status: DC
  Administered 2016-09-27 – 2016-09-28 (×3): 1500 [IU]/h via INTRAVENOUS
  Filled 2016-09-27 (×3): qty 250

## 2016-09-27 MED ORDER — SODIUM CHLORIDE 0.9% FLUSH
3.0000 mL | Freq: Two times a day (BID) | INTRAVENOUS | Status: DC
  Administered 2016-09-29 – 2016-09-30 (×2): 3 mL via INTRAVENOUS

## 2016-09-27 MED ORDER — FUROSEMIDE 10 MG/ML IJ SOLN
40.0000 mg | INTRAMUSCULAR | Status: AC
  Administered 2016-09-27: 40 mg via INTRAVENOUS
  Filled 2016-09-27: qty 4

## 2016-09-27 MED ORDER — GLECAPREVIR-PIBRENTASVIR 100-40 MG PO TABS
3.0000 | ORAL_TABLET | Freq: Every day | ORAL | Status: DC
Start: 2016-09-28 — End: 2016-09-30
  Administered 2016-09-28 – 2016-09-30 (×3): 3 via ORAL
  Filled 2016-09-27 (×4): qty 3

## 2016-09-27 MED ORDER — SODIUM CHLORIDE 0.9% FLUSH
3.0000 mL | Freq: Two times a day (BID) | INTRAVENOUS | Status: DC
  Administered 2016-09-28 – 2016-09-30 (×4): 3 mL via INTRAVENOUS

## 2016-09-27 MED ORDER — FUROSEMIDE 10 MG/ML IJ SOLN
40.0000 mg | Freq: Once | INTRAMUSCULAR | Status: AC
  Administered 2016-09-27: 40 mg via INTRAVENOUS
  Filled 2016-09-27: qty 4

## 2016-09-27 MED ORDER — SODIUM CHLORIDE 0.9% FLUSH: 3.0000 mL | INTRAVENOUS | Status: DC | PRN

## 2016-09-27 MED ORDER — POLYETHYLENE GLYCOL 3350 17 G PO PACK: 17.0000 g | PACK | Freq: Every day | ORAL | Status: DC | PRN

## 2016-09-27 MED ORDER — ASPIRIN 81 MG PO CHEW
81.0000 mg | CHEWABLE_TABLET | Freq: Every day | ORAL | Status: DC
  Administered 2016-09-28 – 2016-09-30 (×3): 81 mg via ORAL
  Filled 2016-09-27 (×3): qty 1

## 2016-09-27 NOTE — Consult Note (Signed)
Patient ID: Zoe Cobb MRN: [id number], DOB/AGE: 63/27/1957   Admit date: 09/27/2016  Reason for Consult: acute on chronic diastolic CHF and new onset atrial fibrillation  Requesting MD: Dr. Deirdre Priest, Internal Medicine   Primary Physician: Norm Salt, PA Primary Cardiologist: Dr. Eden Emms (saw as new pt consult 08/28/16)  Pt. 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 Admitted by IM for a/c diastolic CHF and new onset atrial fibrillation.    Problem List  Past Medical History:  Diagnosis Date  . CHF (congestive heart failure) (HCC)   . Hypertension   . Morbid obesity (HCC)     History reviewed. No pertinent surgical history.   Allergies  Allergies  Allergen Reactions  . Penicillins Swelling    Has patient had a PCN reaction causing immediate rash, facial/tongue/throat swelling, SOB or lightheadedness with hypotension: Yes Has patient had a PCN reaction causing severe rash involving mucus membranes or skin necrosis: No Has patient had a PCN reaction that required hospitalization No Has patient had a PCN reaction occurring within the last 10 years: No If all of the above answers are "NO", then may proceed with Cephalosporin use.     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 She was recently seen by Dr. Eden Emms for consultation on 08/28/16 for acute CHF after presenting to the hospital with a complaint of exertional dyspnea, orthopnea and increased edema. Per consult note, her BNP during that admission was in the 200s but felt to be possibly falsely low due her obesity. 2D echo showed normal LVEF at 55-60% with grade 2DD. She was started on 40 mg BID of PO lasix.  It was outline that no further cardiac w/u would be  considered for this pt who is morbidly obese and non ambulatory. She was discharged on 08/30/16.  She presented back to [facility] today with complaint of recurrent shortness of breath, worsening LEE and palpitations. She was found to be in new onset atrial fibrillation. Rate is in the 90s-low 100s. She denies any h/o stroke/TIA. She reports that he was fully compliant with PO lasix at home but urination slowed. She denies CP. Her CHA2DS2 VASc score is at least 3 for CHF, HTN and female sex.    Home Medications  Prior to Admission medications   Medication Sig Start Date End Date Taking? Authorizing Provider  aspirin EC 81 MG EC tablet Take 1 tablet (81 mg total) by mouth daily. 08/31/16  Yes Almon Hercules, MD  cholecalciferol (VITAMIN D) 1000 units tablet Take 1,000 Units by mouth daily.   Yes Historical Provider, MD  furosemide (LASIX) 40 MG tablet Take 1 tablet (40 mg total) by mouth 2 (two) times daily. 08/30/16  Yes Almon Hercules, MD  MAVYRET 100-40 MG TABS Take 3 tablets by mouth daily.  08/16/16  Yes Historical Provider, MD  polyethylene glycol (MIRALAX / GLYCOLAX) packet Take 17 g by mouth daily as needed for mild constipation. 08/30/16  Yes Almon Hercules, MD   Hospital Meds . [START ON 09/28/2016] aspirin  81 mg Oral Daily  . furosemide  40 mg Intravenous Once  . [START ON 09/28/2016] Glecaprevir-Pibrentasvir  3 tablet Oral Daily  . sodium chloride flush  3 mL Intravenous Q12H  . sodium chloride flush  3 mL Intravenous Q12H    Family History  Family History  Problem Relation Age of Onset  . Hypertension Mother   . Diabetes Mother     Social History  Social History   Social History  . Marital status: Single    Spouse name: N/A  . Number of children: N/A  . Years of education: N/A   Occupational History  . Not on file.   Social History Main Topics  . Smoking status: Former Smoker    Types: Cigarettes  . Smokeless tobacco: Never Used  . Alcohol use No     Comment: once in a while   . Drug use: No  . Sexual activity: Not on file   Other Topics Concern  . Not on file   Social History Narrative   Pt lives by herself, she completed hs.      Review of Systems General:  No chills, fever, night sweats or weight changes.  Cardiovascular:  No chest pain, dyspnea on exertion, edema, orthopnea, palpitations, paroxysmal nocturnal dyspnea. Dermatological: No rash, lesions/masses Respiratory: No cough, dyspnea Urologic: No hematuria, dysuria Abdominal:   No nausea, vomiting, diarrhea, bright red blood per rectum, melena, or hematemesis Neurologic:  No visual changes, wkns, changes in mental status. All other systems reviewed and are otherwise negative except as noted above.  Physical Exam  Blood pressure 113/74, pulse 94, temperature (!) 93.7 F (34.3 C), temperature source Rectal, resp. rate 19, height 5\' 2"  (1.575 m), weight (!) 480 lb (217.7 kg), SpO2 96 %.  General: Pleasant, NAD, morbidly obese  Psych: Normal affect. Neuro: Alert and oriented X 3. Moves all extremities spontaneously. HEENT: Normal  Neck: Supple without bruits or JVD. Lungs:  Resp regular and unlabored, CTA anteriorly  Heart: irregularly irregular, tachy rate no s3, s4, or murmurs. Abdomen: obese Soft, non-tender, non-distended, BS + x 4.  Extremities: obese, 1+ bilateral LE pitting edema. DP/PT/Radials 2+ and equal bilaterally.  Labs  Troponin (Point of Care Test) No results for input(s): TROPIPOC in the last 72 hours.  Recent Labs  09/27/16 0932  TROPONINI <0.03   Lab Results  Component Value Date   WBC 4.9 09/27/2016   HGB 10.4 (L) 09/27/2016   HCT 33.7 (L) 09/27/2016   MCV 83.8 09/27/2016   PLT 115 (L) 09/27/2016    Recent Labs Lab 09/27/16 0932  NA 143  K 4.7  CL 103  CO2 28  BUN 41*  CREATININE 1.18*  CALCIUM 9.6  GLUCOSE 85   Lab Results  Component Value Date   CHOL 139 08/28/2016   HDL 58 08/28/2016   LDLCALC 70 08/28/2016   TRIG 53 08/28/2016   Lab Results   Component Value Date   DDIMER 2.07 (H) 09/27/2016     Radiology/Studies  Dg Chest Port 1 View  Result Date: 09/27/2016 CLINICAL DATA:  Shortness of breath. EXAM: PORTABLE CHEST 1 VIEW COMPARISON:  August 29, 2016 FINDINGS: There is interstitial edema. There is cardiomegaly with pulmonary venous hypertension. No airspace consolidation. No adenopathy. There is degenerative change in each shoulder. IMPRESSION: Findings indicative of congestive heart failure. No airspace consolidation. Electronically Signed   By: Bretta Bang III M.D.   On: 09/27/2016 09:17   Portable Chest 1 View  Result Date: 08/29/2016 CLINICAL DATA:  Shortness of breath. EXAM: PORTABLE CHEST 1 VIEW COMPARISON:  08/28/2016. FINDINGS: Cardiomegaly with pulmonary vascular prominence and bilateral interstitial prominence consistent congestive heart failure. Small  left pleural effusion. No pneumothorax. IMPRESSION: Cardiomegaly with pulmonary vascular prominence and bilateral interstitial prominence consistent with congestive heart failure. Findings progressed from prior exam . Electronically Signed   By: Maisie Fus  Register   On: 08/29/2016 07:23    ECG  Atrial fibrillation  Echocardiogram  Study Conclusions  - Left ventricle: The cavity size was mildly dilated. Wall   thickness was increased in a pattern of mild LVH. Systolic   function was normal. The estimated ejection fraction was in the   range of 55% to 60%. Wall motion was normal; there were no   regional wall motion abnormalities. Features are consistent with   a pseudonormal left ventricular filling pattern, with concomitant   abnormal relaxation and increased filling pressure (grade 2   diastolic dysfunction). - Mitral valve: Calcified annulus. - Left atrium: The atrium was moderately dilated. - Right atrium: The atrium was mildly dilated. - Pulmonary arteries: Systolic pressure was mildly increased. PA   peak pressure: 42 mm Hg  (S).  Impressions:  - Technically difficult; definity used; normal LV systolic   function; grade 2 diastolic dysfunction; moderate LAE; mild RAE;   mild TR with mildly elevated pulmonary pressure.     ASSESSMENT AND PLAN  1. Acute on Chronic Diastolic CHF: bilateral LEE pitting edema noted on exam, otherwise assessment of volume status is difficult given body habitus. She had an echo her last admission 08/2016 that showed normal LVEF 55-60% with grade 2DD and was placed on PO Lasix. Agree with IV Lasix for diuresis given LEE pitting edema. She has a foley. Monitor I/Os closely.   2. New onset Atrial Fibrillation: rate is in the 90s-low 100s. K stable. WBC normal. CXR unremarkable. Consider checking thyroid and UA. Monitor on telemetry. We will start low dose metoprolol, 12.5 mg BID. Monitor BP. Her CHA2DS2 VASc score is at least 3 for CHF, HTN and female sex. She is on IV heparin currently. D-dimer is also abnormal. Recommend initiation of a/c given her risk of stroke, as well as for DVT prophylaxis given her sedentary state. Given her morbid obesity, she may also have underlying OSA.    Signed, Robbie Lis, PA-C 09/27/2016, 4:02 PM   Attending Note:   The patient was seen and examined.  Agree with assessment and plan as noted above.  Changes made to the above note as needed.  Patient seen and independently examined with Robbie Lis, PA .   We discussed all aspects of the encounter. I agree with the assessment and plan as stated above.  1. Atrial fib:  Newly diagnosed.   She has super-morbid obesity and certainly has some degree of obesity hypoventilation .   She may have OSA but she lives alone and does not know if she has apneic episodes.   I agree with stating DOAC.   She has moderate LAE.  It may be difficult / impossible to keep her in NSR.  I would not do a TEE on her given the risk . We could consider empiric amiodarone loading  Starting in a month with DC  cardioversion if she does not convert For now we will continue with anticoagulation and rate control  2. Chronic diastolic CHF- she has 3+ leg edema . She has an elevated d-dimer .   She is very large and it may not be possible to get a CT angio of the lungs.  A venous doppler would likely be difficult. She needs anticoagulation for her atrial fib. Will assume that at  least some of this is due to CHF.  She is on IV lasix      I have spent a total of 40 minutes with patient reviewing hospital  notes , telemetry, EKGs, labs and examining patient as well as establishing an assessment and plan that was discussed with the patient. > 50% of time was spent in direct patient care.    Vesta Mixer, Montez Hageman., MD, [facility] 09/27/2016, 5:32 PM [address],  [address] Office [phone] Pager [phone]

## 2016-09-27 NOTE — Progress Notes (Addendum)
ANTICOAGULATION CONSULT NOTE - FOLLOW UP    HL = 0.67 (goal 0.3 - 0.7 units/mL) Heparin dosing weight = 109 kg   Assessment: 60 YOF continues on IV heparin for new-onset Afib.  Heparin level is therapeutic and toward the high end of normal.  Noted that patient's platelet count is trending down; no bleeding per RN.   Plan: - Continue heparin gtt at 1500 units/hr - F/U AM labs - Monitor closely for s/sx of bleeding     D. Laney Potash, PharmD, BCPS 09/27/2016, 9:13 PM

## 2016-09-27 NOTE — H&P (Signed)
Family Medicine Teaching [facility] Admission History and Physical Service Pager: [phone]  Patient name: Zoe Cobb Medical record number: [id number] Date of birth: Aug 18, 1955 Age: 61 y.o. Gender: female  Primary Care Provider: Norm Salt, PA Consultants:  Cardiology Code Status: FULL (discussed on admission)  Chief Complaint: Dyspnea  Assessment and Plan: Sanya Kobrin is a 61 y.o. female presenting with Dyspnea, LE swelling . PMH is significant for HFpEF, Grade 2 DD; Morbid obesity, Hep C  # Dyspnea: Working diagnosis is CHF exacerbation given elevated BNP 297.2 (increased from previous admission on 1/22).  Last Echo 08/2016 w/ EF 55-60%, Grade 2 diastolic dysfunction w/ moderate LAE, pulmonary pressure 42 and evidence of mild LVH. 1-v CXR with evidence of fluid overload.  EKG showing new onset a-fib.  Lung exam technically difficult given habitus and generalized weakness.  Wells score for PE is 6 (moderate risk), DDimer and LLE u/s ordered.  I am concerned that her body habitus will limit CTA chest.  If DDimer elevated, will need to proceed with V/Q scan.  No evidence of infection at this time, with normal WBC.  STrop negative.  She is not reporting CP, nausea, diaphoresis, so I think MI to be less likely (though would still keep on DDx for now given cardiac equivalent).  Cr elevated 1.18.  Isolated tachycardia seen around 10am.  She is currently rate controlled but p-waves somewhat difficult to make out on telemetry.  No O2 requirement.  She was placed on [place] for comfort only in ED.  Additionally, weight is stable from discharge at 480#. - Place in observation, telemetry, under Dr Deirdre Priest - VS per floor protocol - Heparin gtt per pharmacy for new onset A-fib.   - c/s cardiology.  Appreciate recs - Lasix IV 40mg  scheduled 1700.  Reassess need for repeat diuresis at that time - Strict I/O. - Place foley catheter.  Remove when able. - Daily weights - Pulse Ox w/ vitals - CMP  in am - O2 prn desaturation <90% - Recommend outpatient referral for sleep study.  Likelihood of underlying OHS/OSA pretty high in this morbidly obese patient. - c/s CSW and CM for [facility] needs and equipment, specifically needs a hospital bed that will accommodate body habitus and limited mobility.  Needs home scale. - Recommend completing and reviewing a CHF action plan with patient. - LLE u/s ordered - DDimer ordered - V/Q scan if DDimer positive - Temp of 93.35F noted.  EDP has verbalized to RN to recheck this temperature for accuracy. - Attempt 2-vv CXR in am  # New onset atrial fibrillation: seen on ED EKG.  Likely paroxysmal. Rate controlled.  BP somewhat borderline so no additional agents started.  TSH 5.721, Free T4 1.04 in 08/2016 - Heparin gtt started per pharmacy - CBC in am - follow up cards recs  # AKI: Cr 1.18 on admission.  Cr at dc was 1.1.  Baseline appears to be 0.88. - Monitor renal function - Continue Lasix as above  # HTN: controlled on Lasix alone - Monitor closely as she is diuresed  # Morbid obesity: A1c 5.5 at last hospitalization.   - Weight reduction recommended.  Would consider discussion for bariatric surgery  # Hep C: on Mavyret at home.  Medication discussed with Revonda Standard, PharmD.  No contraindications to continuing at this time. - Continue home medication - CMP in am  FEN/GI: SLIV, HH diet Prophylaxis: heparin gtt  Disposition: Place in observation.    History of Present Illness:  Zoe  Margo Cobb is a 61 y.o. female presenting with dyspnea  Patient notes that about 2-3 weeks ago, she felt like she was retaining fluid.  She does not monitor daily weights but notes that she felt like her LEs were heavier and that her abdomen felt fuller.  She reports compliance with Lasix 40mg  PO BID but notes that she has not been diuresing well over the last few weeks.  She did not see her PCP for this.  She reports that on Monday, she started feeling a little more dyspneic  that usual.  Her [facility] RN listened to her lung sounds and noticed R sided wheeze.  Her daughter reports that she did not seek medical care at that time because they were trying to sort out her home hospital bed issue.  She notes the wrong size was delivered.  Patient denies CP, cough, fevers, chills, myalgia, diarrhea, nausea, diaphoresis, abdominal pain, dysuria, hematuria.  She does report decreased appetite over last few days.  Denies early satiety.  Additionally, she reports that she has not had a sleep study done.  She doesn't recall this being mentioned before.  She resides at home alone.  She has a daughter that lives a few houses down that comes to check in on her.  She has been unable to self transfer recently due to symptoms and slept in her wheelchair last night.  She is unable to make it to the restroom independently or use a bed pan at this time.  She has a HH RN/aide that comes in daily for a few hours.  No tobacco, drug or ETOH use.  Her POA is her daughter.  Review Of Systems: Per HPI with the following additions: none  ROS  Patient Active Problem List   Diagnosis Date Noted  . CHF exacerbation (HCC) 09/27/2016  . Swelling of left lower extremity 09/27/2016  . New onset a-fib (HCC) 09/27/2016  . CHF (congestive heart failure) (HCC) 08/28/2016  . Shortness of breath 08/28/2016  . Pressure injury of skin 08/28/2016  . Morbid obesity due to excess calories (HCC) 06/28/2015    Past Medical History: Past Medical History:  Diagnosis Date  . CHF (congestive heart failure) (HCC)   . Hypertension   . Morbid obesity (HCC)     Past Surgical History: History reviewed. No pertinent surgical history.  Social History: Social History  Substance Use Topics  . Smoking status: Former Smoker    Types: Cigarettes  . Smokeless tobacco: Never Used  . Alcohol use 0.0 oz/week     Comment: once in a while   Additional social history: none  Please also refer to relevant sections of  EMR.  Family History: Family History  Problem Relation Age of Onset  . Hypertension Mother   . Diabetes Mother    Allergies and Medications: Allergies  Allergen Reactions  . Penicillins Swelling    Has patient had a PCN reaction causing immediate rash, facial/tongue/throat swelling, SOB or lightheadedness with hypotension: Yes Has patient had a PCN reaction causing severe rash involving mucus membranes or skin necrosis: No Has patient had a PCN reaction that required hospitalization No Has patient had a PCN reaction occurring within the last 10 years: No If all of the above answers are "NO", then may proceed with Cephalosporin use.    No current facility-administered medications on file prior to encounter.    Current Outpatient Prescriptions on File Prior to Encounter  Medication Sig Dispense Refill  . aspirin EC 81 MG EC tablet Take  1 tablet (81 mg total) by mouth daily. 30 tablet 2  . cholecalciferol (VITAMIN D) 1000 units tablet Take 1,000 Units by mouth daily.    . furosemide (LASIX) 40 MG tablet Take 1 tablet (40 mg total) by mouth 2 (two) times daily. 30 tablet 1  . MAVYRET 100-40 MG TABS Take 3 tablets by mouth daily.   1  . polyethylene glycol (MIRALAX / GLYCOLAX) packet Take 17 g by mouth daily as needed for mild constipation. 14 each 0    Objective: BP 112/83   Pulse 71   Temp (!) 93.7 F (34.3 C) (Rectal)   Resp 21   Ht 5\' 2"  (1.575 m)   Wt (!) 480 lb (217.7 kg)   SpO2 93%   BMI 87.79 kg/m  Exam: General: awake, alert, tired appearing, NAD, breathing on RA Eyes: EOMI, sclera white ENTM: MMM, dentition poor Neck: assessment for JVD limited by neck girth  Cardiovascular: Seemingly regular rate, heart sounds difficult to assess, cardiac monitor w/ normal rate w p waves that are difficult to see. Respiratory: Global faint expiratory wheezes appreciated anteriorly, normal WOB on room air. Able to speak in full sentences Gastrointestinal: obese, soft, NT/ND,  +BS MSK: moderate pitting edema to bilateral knees.  LLE seems to be slightly more edematous compared to RLE.  Additionally, LLE with slight erythema compared to RLE.  B/l LE TTP Derm: no skin breakdown, erythema as above Neuro: follows all commands, speech normal Psych: mood stable, affect appropriate  Labs and Imaging:  Results for orders placed or performed during the hospital encounter of 09/27/16 (from the past 24 hour(s))  Basic metabolic panel     Status: Abnormal   Collection Time: 09/27/16  9:32 AM  Result Value Ref Range   Sodium 143 135 - 145 mmol/L   Potassium 4.7 3.5 - 5.1 mmol/L   Chloride 103 101 - 111 mmol/L   CO2 28 22 - 32 mmol/L   Glucose, Bld 85 65 - 99 mg/dL   BUN 41 (H) 6 - 20 mg/dL   Creatinine, Ser 1.61 (H) 0.44 - 1.00 mg/dL   Calcium 9.6 8.9 - 09.6 mg/dL   GFR calc non Af Amer 48 (L) >60 mL/min   GFR calc Af Amer 56 (L) >60 mL/min   Anion gap 12 5 - 15  CBC with Differential/Platelet     Status: Abnormal   Collection Time: 09/27/16  9:32 AM  Result Value Ref Range   WBC 4.9 4.0 - 10.5 K/uL   RBC 4.02 3.87 - 5.11 MIL/uL   Hemoglobin 10.4 (L) 12.0 - 15.0 g/dL   HCT 04.5 (L) 40.9 - 81.1 %   MCV 83.8 78.0 - 100.0 fL   MCH 25.9 (L) 26.0 - 34.0 pg   MCHC 30.9 30.0 - 36.0 g/dL   RDW 91.4 (H) 78.2 - 95.6 %   Platelets 115 (L) 150 - 400 K/uL   Neutrophils Relative % 69 %   Neutro Abs 3.4 1.7 - 7.7 K/uL   Lymphocytes Relative 18 %   Lymphs Abs 0.9 0.7 - 4.0 K/uL   Monocytes Relative 12 %   Monocytes Absolute 0.6 0.1 - 1.0 K/uL   Eosinophils Relative 1 %   Eosinophils Absolute 0.1 0.0 - 0.7 K/uL   Basophils Relative 0 %   Basophils Absolute 0.0 0.0 - 0.1 K/uL  Protime-INR     Status: None   Collection Time: 09/27/16  9:32 AM  Result Value Ref Range   Prothrombin Time 14.9  11.4 - 15.2 seconds   INR 1.16   Troponin I     Status: None   Collection Time: 09/27/16  9:32 AM  Result Value Ref Range   Troponin I <0.03 <0.03 ng/mL  Brain natriuretic peptide      Status: Abnormal   Collection Time: 09/27/16  9:32 AM  Result Value Ref Range   B Natriuretic Peptide 297.2 (H) 0.0 - 100.0 pg/mL    Dg Chest Port 1 View  Result Date: 09/27/2016 CLINICAL DATA:  Shortness of breath. EXAM: PORTABLE CHEST 1 VIEW COMPARISON:  August 29, 2016 FINDINGS: There is interstitial edema. There is cardiomegaly with pulmonary venous hypertension. No airspace consolidation. No adenopathy. There is degenerative change in each shoulder. IMPRESSION: Findings indicative of congestive heart failure. No airspace consolidation. Electronically Signed   By: Bretta Bang III M.D.   On: 09/27/2016 09:17    Raliegh Ip, DO 09/27/2016, 12:52 PM PGY-3, [facility] FPTS Intern pager: [phone], text pages welcome

## 2016-09-27 NOTE — Progress Notes (Signed)
Patient admitted from the ED. Patient alert and oriented. Vitals completed and telemetry placed. Foley catheter placed. Skin assessment done with second RN. Patient has no other needs at this time.

## 2016-09-27 NOTE — ED Provider Notes (Signed)
[facility]-EMER[facility]ENCY DEPT Provider Note   CSN: [id number] Arrival date & time: 09/27/16  4098   History   Chief Complaint Chief Complaint  Patient presents with  . Shortness of Breath    HPI Zoe Cobb is a 61 y.o. female.  HPI  The patient is a 61 year old female who was recently diagnosed with congestive heart failure which was thought to be the most likely answer given the patient's morbid obesity, underlying hypertension and shortness of breath with wheezing which occurred as a new diagnosis during the last month. She was admitted to the hospital during January, underwent an echocardiogram showing an ejection fraction of 55-60%. Currently the patient is wheelchair bound, nonambulatory, she does not even go to the bathroom except in her bed on a pad. She has home health who comes in 5 hours a day. The patient was recently taken off of her high blood pressure medications while in the hospital because of good blood pressure control on Lasix alone. She was discharged on Lasix but states that she has not been having any increased amount of urine flow and feels like she is swelling more. She is becoming more dyspneic and was found to be slightly hypoxic down to approximately 90% on room air when paramedics arrived at her house. The patient is known to have dyspnea and obesity hypoventilation syndrome secondary to his severe morbid obesity. She denies chest pain, denies fever, endorses increased swelling of the legs. She reports being compliant with her medications.  Sister who lives near and states that she is the [facility] states that she has started to feel SOB and have difficulty with getting around her house for the last 2 days - she has had increased SOB and swelling / weight gain.  It took 3 people to move her around in her bed today to mobilize her where usually she is able to get out into her wheelchair and go out of the house.  She only recently has had this decline, no COPD or tob use.  She has  been Psychologist, occupational - she slept in her wheelchair last night b/c she can't get into there bed (Had new bed delivered in last 24 hours) which is too small and has no controls to help maneuver bed.  Past Medical History:  Diagnosis Date  . CHF (congestive heart failure) (HCC)   . Hypertension   . Morbid obesity [facility])     Patient Active Problem List   Diagnosis Date Noted  . CHF exacerbation (HCC) 09/27/2016  . CHF (congestive heart failure) (HCC) 08/28/2016  . Shortness of breath 08/28/2016  . Pressure injury of skin 08/28/2016  . Morbid obesity due to excess calories (HCC) 06/28/2015    History reviewed. No pertinent surgical history.  OB History    No data available       Home Medications    Prior to Admission medications   Medication Sig Start Date End Date Taking? Authorizing Provider  aspirin EC 81 MG EC tablet Take 1 tablet (81 mg total) by mouth daily. 08/31/16  Yes Almon Hercules, MD  cholecalciferol (VITAMIN D) 1000 units tablet Take 1,000 Units by mouth daily.   Yes Historical Provider, MD  furosemide (LASIX) 40 MG tablet Take 1 tablet (40 mg total) by mouth 2 (two) times daily. 08/30/16  Yes Almon Hercules, MD  MAVYRET 100-40 MG TABS Take 3 tablets by mouth daily.  08/16/16  Yes Historical Provider, MD  polyethylene glycol (MIRALAX / GLYCOLAX) packet Take 17 g by  mouth daily as needed for mild constipation. 08/30/16  Yes Almon Hercules, MD    Family History Family History  Problem Relation Age of Onset  . Hypertension Mother   . Diabetes Mother     Social History Social History  Substance Use Topics  . Smoking status: Former Smoker    Types: Cigarettes  . Smokeless tobacco: Never Used  . Alcohol use 0.0 oz/week     Comment: once in a while     Allergies   Penicillins   Review of Systems Review of Systems  All other systems reviewed and are negative.    Physical Exam Updated Vital Signs BP 100/80 (BP Location: Left Arm)   Pulse 88   Temp (!) 93.7 F (34.3  C) (Rectal)   Resp 20   Ht 5\' 2"  (1.575 m)   Wt (!) 480 lb (217.7 kg)   SpO2 93%   BMI 87.79 kg/m   Physical Exam  Constitutional: She appears well-developed and well-nourished. No distress.  HENT:  Head: Normocephalic and atraumatic.  Mouth/Throat: Oropharynx is clear and moist. No oropharyngeal exudate.  Eyes: Conjunctivae and EOM are normal. Pupils are equal, round, and reactive to light. Right eye exhibits no discharge. Left eye exhibits no discharge. No scleral icterus.  Neck: Normal range of motion. Neck supple. No JVD present. No thyromegaly present.  Cardiovascular: Normal rate, regular rhythm, normal heart sounds and intact distal pulses.  Exam reveals no gallop and no friction rub.   No murmur heard. Pulmonary/Chest: Effort normal. No respiratory distress. She has wheezes. She has no rales.  Increased WOB (mild) - with wheezing and increased WOB.    Abdominal: Soft. Bowel sounds are normal. She exhibits no distension and no mass. There is no tenderness.  Musculoskeletal: Normal range of motion. She exhibits edema. She exhibits no tenderness.  Lymphadenopathy:    She has no cervical adenopathy.  Neurological: She is alert. Coordination normal.  Skin: Skin is warm and dry. No rash noted. No erythema.  Psychiatric: She has a normal mood and affect. Her behavior is normal.  Nursing note and vitals reviewed.   ED Treatments / Results  Labs (all labs ordered are listed, but only abnormal results are displayed) Labs Reviewed  BASIC METABOLIC PANEL - Abnormal; Notable for the following:       Result Value   BUN 41 (*)    Creatinine, Ser 1.18 (*)    GFR calc non Af Amer 48 (*)    GFR calc Af Amer 56 (*)    All other components within normal limits  CBC WITH DIFFERENTIAL/PLATELET - Abnormal; Notable for the following:    Hemoglobin 10.4 (*)    HCT 33.7 (*)    MCH 25.9 (*)    RDW 17.7 (*)    Platelets 115 (*)    All other components within normal limits  BRAIN  NATRIURETIC PEPTIDE - Abnormal; Notable for the following:    B Natriuretic Peptide 297.2 (*)    All other components within normal limits  PROTIME-INR  TROPONIN I    EKG  EKG Interpretation  Date/Time:  Wednesday September 27 2016 08:47:41 EST Ventricular Rate:  98 PR Interval:    QRS Duration: 135 QT Interval:  349 QTC Calculation: 448 R Axis:   92 Text Interpretation:  Atrial fibrillation Nonspecific intraventricular conduction delay Minimal ST depression, inferior leads Baseline wander in lead(s) II III aVF Since last tracing Atrial fibrillation NOW PRESENT Confirmed by   MD,  ([id number]) on  09/27/2016 8:58:43 AM      The pt seems to be in new onset afib - lasix given, CXR obtained Appears to need admission.  Radiology Dg Chest Port 1 View  Result Date: 09/27/2016 CLINICAL DATA:  Shortness of breath. EXAM: PORTABLE CHEST 1 VIEW COMPARISON:  August 29, 2016 FINDINGS: There is interstitial edema. There is cardiomegaly with pulmonary venous hypertension. No airspace consolidation. No adenopathy. There is degenerative change in each shoulder. IMPRESSION: Findings indicative of congestive heart failure. No airspace consolidation. Electronically Signed   By: Bretta Bang III M.D.   On: 09/27/2016 09:17    Procedures Procedures (including critical care time)  Medications Ordered in ED Medications  furosemide (LASIX) injection 40 mg (40 mg Intravenous Given 09/27/16 1033)     Initial Impression / Assessment and Plan / ED Course  I have reviewed the triage vital signs and the nursing notes.  Pertinent labs & imaging results that were available during my care of the patient were reviewed by me and considered in my medical decision making (see chart for details).     The pt has new onset afib Has had pulm edema on the CXR Pt needs admission D/w FP resident who is agreeable.  Final Clinical Impressions(s) / ED Diagnoses   Final diagnoses:  Atrial fibrillation,  new onset [facility])    New Prescriptions New Prescriptions   No medications on file     Eber Hong, MD 09/27/16 1120

## 2016-09-27 NOTE — ED Notes (Signed)
Placed a female uriniral so patient can use the bathroom

## 2016-09-27 NOTE — ED Notes (Signed)
Admitting paged and responded re afib.  Stated will wait for cardiology to decided meds for new onset afib.

## 2016-09-27 NOTE — Care Management Note (Addendum)
Case Management Note  Patient Details  Name: Zoe Cobb MRN: [id number] Date of Birth: 12/15/1955  Subjective/Objective:                  From home alone with home health. /61 year old female who was recently diagnosed with congestive heart failure, underlying hypertension and shortness of breath with wheezing.   Action/Plan: Admit status OBSERVATION (DYSPNEA, NEW AFIB); anticipate discharge HOME WITH HOME HEALTH VS SNF.   Expected Discharge Date:   (UNSURE)               Expected Discharge Plan:  Home w Home Health Services  In-House Referral:  Clinical Social Work  Discharge planning Services  CM Consult  Post Acute Care Choice:    Choice offered to:     DME Arranged:    DME Agency:     HH Arranged:    HH Agency:   [facility]  Status of Service:  In process, will continue to follow  If discussed at Long Length of Stay Meetings, dates discussed:    Additional Comments: Daughter is POA Firefighter), sister is HCPOA (ED) confirmed by [facility]. [name], RN 09/27/2016, 2:28 PM

## 2016-09-27 NOTE — Progress Notes (Signed)
ANTICOAGULATION CONSULT NOTE - Initial Consult  Pharmacy Consult for heparin Indication: atrial fibrillation  Allergies  Allergen Reactions  . Penicillins Swelling    Has patient had a PCN reaction causing immediate rash, facial/tongue/throat swelling, SOB or lightheadedness with hypotension: Yes Has patient had a PCN reaction causing severe rash involving mucus membranes or skin necrosis: No Has patient had a PCN reaction that required hospitalization No Has patient had a PCN reaction occurring within the last 10 years: No If all of the above answers are "NO", then may proceed with Cephalosporin use.     Patient Measurements: Height: 5\' 2"  (157.5 cm) Weight: (!) 480 lb (217.7 kg) IBW/kg (Calculated) : 50.1 Heparin Dosing Weight: 109kg  Vital Signs: Temp: 93.7 F (34.3 C) (02/21 0937) Temp Source: Rectal (02/21 0937) BP: 112/83 (02/21 1115) Pulse Rate: 71 (02/21 1115)  Labs:  Recent Labs  09/27/16 0932  HGB 10.4*  HCT 33.7*  PLT 115*  LABPROT 14.9  INR 1.16  CREATININE 1.18*  TROPONINI <0.03    Estimated Creatinine Clearance: 91.4 mL/min (by C-G formula based on SCr of 1.18 mg/dL (H)).   Medical History: Past Medical History:  Diagnosis Date  . CHF (congestive heart failure) (HCC)   . Hypertension   . Morbid obesity (HCC)     Medications:  Infusions:  . heparin      Assessment: 87 yof presented to the ED with SOB. Found to be in new onset afib. Baseline H/H is slightly low and platelets are low at 115. She is not on anticoagulation PTA. To start IV heparin. Patient size may make dosing heparin challenging.   Goal of Therapy:  Heparin level 0.3-0.7 units/ml Monitor platelets by anticoagulation protocol: Yes   Plan:  Heparin bolus 5500 units IV x 1 Heparin gtt 1500 units/hr Check a 6 hr heparin level Daily heparin level and CBC  , Drake Leach 09/27/2016,12:53 PM

## 2016-09-27 NOTE — Progress Notes (Signed)
Home medication (Glecaprevir-Pibrentasvir) sent to pharmacy to be dispensed.

## 2016-09-27 NOTE — ED Triage Notes (Signed)
Pt states here via [facility] for increasing sob, loss of appetite and weakness x 3 days.  Was recently admitted for chf exacerbation.  RA sats of 96%, breathing non-labored, hr 87, bp 104/78, rr 18.

## 2016-09-28 ENCOUNTER — Observation Stay (HOSPITAL_BASED_OUTPATIENT_CLINIC_OR_DEPARTMENT_OTHER): Payer: Medicaid Other

## 2016-09-28 DIAGNOSIS — R0602 Shortness of breath: Secondary | ICD-10-CM | POA: Diagnosis not present

## 2016-09-28 DIAGNOSIS — M7989 Other specified soft tissue disorders: Secondary | ICD-10-CM | POA: Diagnosis not present

## 2016-09-28 DIAGNOSIS — R609 Edema, unspecified: Secondary | ICD-10-CM | POA: Diagnosis not present

## 2016-09-28 DIAGNOSIS — Z6841 Body Mass Index (BMI) 40.0 and over, adult: Secondary | ICD-10-CM | POA: Diagnosis not present

## 2016-09-28 DIAGNOSIS — E662 Morbid (severe) obesity with alveolar hypoventilation: Secondary | ICD-10-CM | POA: Diagnosis present

## 2016-09-28 DIAGNOSIS — Z87891 Personal history of nicotine dependence: Secondary | ICD-10-CM | POA: Diagnosis not present

## 2016-09-28 DIAGNOSIS — B1920 Unspecified viral hepatitis C without hepatic coma: Secondary | ICD-10-CM | POA: Diagnosis present

## 2016-09-28 DIAGNOSIS — Z993 Dependence on wheelchair: Secondary | ICD-10-CM | POA: Diagnosis not present

## 2016-09-28 DIAGNOSIS — R791 Abnormal coagulation profile: Secondary | ICD-10-CM | POA: Diagnosis present

## 2016-09-28 DIAGNOSIS — I4891 Unspecified atrial fibrillation: Secondary | ICD-10-CM | POA: Diagnosis not present

## 2016-09-28 DIAGNOSIS — I5033 Acute on chronic diastolic (congestive) heart failure: Secondary | ICD-10-CM | POA: Diagnosis not present

## 2016-09-28 DIAGNOSIS — Z7982 Long term (current) use of aspirin: Secondary | ICD-10-CM | POA: Diagnosis not present

## 2016-09-28 DIAGNOSIS — I5032 Chronic diastolic (congestive) heart failure: Secondary | ICD-10-CM | POA: Diagnosis present

## 2016-09-28 DIAGNOSIS — R0902 Hypoxemia: Secondary | ICD-10-CM | POA: Diagnosis present

## 2016-09-28 DIAGNOSIS — N179 Acute kidney failure, unspecified: Secondary | ICD-10-CM | POA: Diagnosis present

## 2016-09-28 DIAGNOSIS — Z79899 Other long term (current) drug therapy: Secondary | ICD-10-CM | POA: Diagnosis not present

## 2016-09-28 DIAGNOSIS — I110 Hypertensive heart disease with heart failure: Secondary | ICD-10-CM | POA: Diagnosis present

## 2016-09-28 DIAGNOSIS — Z833 Family history of diabetes mellitus: Secondary | ICD-10-CM | POA: Diagnosis not present

## 2016-09-28 DIAGNOSIS — Z8249 Family history of ischemic heart disease and other diseases of the circulatory system: Secondary | ICD-10-CM | POA: Diagnosis not present

## 2016-09-28 LAB — COMPREHENSIVE METABOLIC PANEL
ALBUMIN: 3 g/dL — AB (ref 3.5–5.0)
ALT: 18 U/L (ref 14–54)
AST: 33 U/L (ref 15–41)
Alkaline Phosphatase: 119 U/L (ref 38–126)
Anion gap: 11 (ref 5–15)
BUN: 39 mg/dL — ABNORMAL HIGH (ref 6–20)
CALCIUM: 9.7 mg/dL (ref 8.9–10.3)
CO2: 29 mmol/L (ref 22–32)
CREATININE: 1.18 mg/dL — AB (ref 0.44–1.00)
Chloride: 104 mmol/L (ref 101–111)
GFR calc non Af Amer: 48 mL/min — ABNORMAL LOW (ref >60)
GFR, EST AFRICAN AMERICAN: 56 mL/min — AB (ref >60)
GLUCOSE: 75 mg/dL (ref 65–99)
Potassium: 4.5 mmol/L (ref 3.5–5.1)
SODIUM: 144 mmol/L (ref 135–145)
Total Bilirubin: 0.9 mg/dL (ref 0.3–1.2)
Total Protein: 8.3 g/dL — ABNORMAL HIGH (ref 6.5–8.1)

## 2016-09-28 LAB — TSH: TSH: 5.894 u[IU]/mL — ABNORMAL HIGH (ref 0.350–4.500)

## 2016-09-28 LAB — HEPARIN LEVEL (UNFRACTIONATED): Heparin Unfractionated: 0.56 IU/mL (ref 0.30–0.70)

## 2016-09-28 LAB — HIV ANTIBODY (ROUTINE TESTING W REFLEX): HIV Screen 4th Generation wRfx: NONREACTIVE

## 2016-09-28 MED ORDER — IPRATROPIUM-ALBUTEROL 0.5-2.5 (3) MG/3ML IN SOLN: 3.0000 mL | Freq: Once | RESPIRATORY_TRACT | Status: DC

## 2016-09-28 MED ORDER — FUROSEMIDE 10 MG/ML IJ SOLN
40.0000 mg | Freq: Two times a day (BID) | INTRAMUSCULAR | Status: DC
  Administered 2016-09-28 – 2016-09-29 (×3): 40 mg via INTRAVENOUS
  Filled 2016-09-28 (×3): qty 4

## 2016-09-28 MED ORDER — FUROSEMIDE 10 MG/ML IJ SOLN: 40.0000 mg | Freq: Every day | INTRAMUSCULAR | Status: DC

## 2016-09-28 MED ORDER — ACETAMINOPHEN 325 MG PO TABS
650.0000 mg | ORAL_TABLET | Freq: Four times a day (QID) | ORAL | Status: DC | PRN
  Administered 2016-09-28 – 2016-09-30 (×3): 650 mg via ORAL
  Filled 2016-09-28 (×3): qty 2

## 2016-09-28 NOTE — Progress Notes (Signed)
Pt c/o pain under her breast area.  7/10 scale.  Notified Dr. Myrtie Soman.  Instructed will order pain med.  Will continue to monitor.

## 2016-09-28 NOTE — Progress Notes (Signed)
*  PRELIMINARY RESULTS* Vascular Ultrasound Left lower extremity venous duplex has been completed.  Preliminary findings: technically limited due to body habitus. Not all veins visualized. No obvious DVT noted in visualized veins.    Farrel Demark, RDMS, RVT  09/28/2016, 1:03 PM

## 2016-09-28 NOTE — Progress Notes (Signed)
Family Medicine Teaching Service Daily Progress Note Intern Pager: [phone]  Patient name: Zoe Cobb Medical record number: [id number] Date of birth: [birth year] Age: 61 y.o. Gender: female  Primary Care Provider: Norm Salt, PA Consultants: Cardiology Code Status: Full code  Pt Overview and Major Events to Date:  1. Admitted to FMTS  Assessment and Plan:  Dyspnea, stable: Multifactorial with CHF, morbid obesity and OSA/OHS all likely playing role.  BNP elevated to 297, increased from prior admission.  Echo 1/18 showed EF 55-60% and GDDS with mod LAE and pulmonary pressure of 42. CXR showed fluid overload. Troponin <0.03x2. EKG with new onset a-fib that is rate controlled. Does have a wells score of 6 and has d-dimer elevated to 2.  Views of CTA chest will likely be limited d/t body habitus. Cardiology recommends continued IV lasix. Diffuse expiratory wheezing without crackles or rhonchi.   - UOP 1.2L 24hrs continue IV lasix 40mg , increase to BID - Strict I/Os - duoneb x1 and reassess  - remove foley when able - cardiology following; appreciate recs - needs outpatient sleep study given high liklihood of OHS/OSA - AM 2V CXR - LE [name] (not done yet)  New onset A-fib, rate controlled: Continued to be rate controlled overnight. Started on metoprolol 12.5mg  BID and IV heparin. Many RF for a-fib including obesity, heart failure and very likely has OSA and OHS.  Will discuss affordability of DOAC with pharmacy. Seen by Cardiology who recommends anticoagulation and rate control with possible amiodarone loading starting in a month and DCCV if she does not convert.  Asymptomatic, denies CP or significant SOB. Satting 95%RA - continue telemetry - metoprolol 12.5mg  BID - cont IV heparin  - plan for rivaroxaban 20mg  daily - cardiology following; appreciate recommendations  Morbid obesity:  Will discuss with POA and patient regarding weight loss and open the idea of bariatric surgery as this  would greatly improve her life expenxtancy and symptoms.  - surgery c/s?   Hepatitis C: Stable on mavyret - continue home regimen  AKI: Baseline at last DC was 1.1 and most recently elevated to 1.18.  - BMP - ok to continue lasix 40mg  IV BID  HTN: Controlled with lasix daily. WNL - cont home regimen  FEN/GI: SLIV, heart healthy PPx: heparin drip  Disposition: SNF?  Subjective:  Feeling well this morning. Denies CP, but does have some SOB.  Legs feel tight.   Objective: Temp:  [93.7 F (34.3 C)-97.5 F (36.4 C)] 97.5 F (36.4 C) (02/22 0404) Pulse Rate:  [43-144] 100 (02/22 0404) Resp:  [16-30] 18 (02/22 0404) BP: (81-113)/(53-93) 107/64 (02/22 0404) SpO2:  [58 %-97 %] 95 % (02/22 0404) Weight:  [473 lb (214.6 kg)-480 lb (217.7 kg)] 473 lb (214.6 kg) (02/22 0404) Physical Exam: General: awake, alert, tired appearing, NAD, breathing on RA Neck: assessment for JVD limited by neck girth  Cardiovascular: Seemingly regular rate, heart sounds difficult to assess, cardiac monitor w/ normal rate w p waves that are difficult to see. Respiratory: Able to speak in full sentences, diffuse expiratory wheezing without crackles or rhonchi Gastrointestinal: obese, soft, NT/ND, +BS MSK: moderate pitting edema to bilateral knees.  LLE seems to be slightly more edematous compared to RLE.  Neuro: follows all commands, speech normal Psych: mood stable, affect appropriate  Laboratory:  Recent Labs Lab 09/27/16 0932 09/27/16 2019  WBC 4.9 4.3  HGB 10.4* 9.7*  HCT 33.7* 31.4*  PLT 115* 96*    Recent Labs Lab 09/27/16 0932 09/27/16 2019 09/28/16  0357  NA 143  --  144  K 4.7  --  4.5  CL 103  --  104  CO2 28  --  29  BUN 41*  --  39*  CREATININE 1.18* 1.16* 1.18*  CALCIUM 9.6  --  9.7  PROT  --   --  8.3*  BILITOT  --   --  0.9  ALKPHOS  --   --  119  ALT  --   --  18  AST  --   --  33  GLUCOSE 85  --  75    Imaging/Diagnostic Tests: Dg Chest Port 1 View  Result  Date: 09/27/2016 CLINICAL DATA:  Shortness of breath. EXAM: PORTABLE CHEST 1 VIEW COMPARISON:  August 29, 2016 FINDINGS: There is interstitial edema. There is cardiomegaly with pulmonary venous hypertension. No airspace consolidation. No adenopathy. There is degenerative change in each shoulder. IMPRESSION: Findings indicative of congestive heart failure. No airspace consolidation. Electronically Signed   By: Bretta Bang III M.D.   On: 09/27/2016 09:17    Renne Musca, MD 09/28/2016, 7:05 AM PGY-1, [facility] Family Medicine FPTS Intern pager: [phone], text pages welcome

## 2016-09-28 NOTE — Progress Notes (Signed)
ANTICOAGULATION CONSULT NOTE  Pharmacy Consult for heparin Indication: atrial fibrillation  Allergies  Allergen Reactions  . Penicillins Swelling    Has patient had a PCN reaction causing immediate rash, facial/tongue/throat swelling, SOB or lightheadedness with hypotension: Yes Has patient had a PCN reaction causing severe rash involving mucus membranes or skin necrosis: No Has patient had a PCN reaction that required hospitalization No Has patient had a PCN reaction occurring within the last 10 years: No If all of the above answers are "NO", then may proceed with Cephalosporin use.     Patient Measurements: Height: 5\' 2"  (157.5 cm) Weight: (!) 473 lb (214.6 kg) (scale b) IBW/kg (Calculated) : 50.1 Heparin Dosing Weight: 109kg  Vital Signs: Temp: 97.5 F (36.4 C) (02/22 0404) Temp Source: Oral (02/22 0404) BP: 107/64 (02/22 0404) Pulse Rate: 100 (02/22 0404)  Labs:  Recent Labs  09/27/16 0932 09/27/16 2019 09/28/16 0357  HGB 10.4* 9.7*  --   HCT 33.7* 31.4*  --   PLT 115* 96*  --   LABPROT 14.9  --   --   INR 1.16  --   --   HEPARINUNFRC  --  0.67 0.56  CREATININE 1.18* 1.16* 1.18*  TROPONINI <0.03 <0.03  --     Estimated Creatinine Clearance: 90.4 mL/min (by C-G formula based on SCr of 1.18 mg/dL (H)).   Medical History: Past Medical History:  Diagnosis Date  . CHF (congestive heart failure) (HCC)   . Hypertension   . Morbid obesity (HCC)     Medications:  Infusions:  . heparin 1,500 Units/hr (09/28/16 2706)    Assessment: 60 yof presented to the ED with SOB. Found to be in new onset afib. Baseline H/H is slightly low and platelets are low at 115. She is not on anticoagulation PTA. To start IV heparin. Patient size may make dosing heparin challenging.   Repeat heparin level is therapeutic at 0.56 units/hr. No issues with infusion or bleeding noted.  Goal of Therapy:  Heparin level 0.3-0.7 units/ml Monitor platelets by anticoagulation protocol:  Yes   Plan:  Heparin 1500 units/hr Daily heparin level and CBC  Arlean Hopping. Newman Pies, PharmD, BCPS Clinical Pharmacist [phone] 09/28/2016,7:41 AM

## 2016-09-28 NOTE — Progress Notes (Addendum)
Consult for Hospital Bed Patient has a hospital bed delivered to her home through [facility] recently but stated that the bed is too small; Brad with [facility] called, he is working on the case / also making adjustments to the bed.  Consult for DOAC  Patient has Medicaid with prescription drug coverageAlexis Cobb [phone]  Coverage Information   Coverage information:                Payor: 246-MEDICAID     Benefit plan: [id number]-MEDICAID [place] ACCESS Ph: [phone]    Group number: Not given    Member effective dates: from 11/26/13

## 2016-09-28 NOTE — Clinical Social Work Note (Signed)
CSW acknowledges consult "Needs bariatric hospital bed at home. Please help resume HH services at discharge." [facility] consulted as well.  CSW signing off. Consult again if any social work needs arise.  Charlynn Court, CSW [phone]

## 2016-09-28 NOTE — Progress Notes (Signed)
Patient rested well overnight. Vital signs stable. No complaints of pain. No complaints of respiratory distress. Patient in bed resting.

## 2016-09-28 NOTE — Progress Notes (Signed)
Minimum amount of bleeding noted when completing f/c care, will continue to monitor.

## 2016-09-28 NOTE — Discharge Summary (Signed)
Family Medicine Teaching [facility] Discharge Summary  Patient name: Zoe Cobb Medical record number: [id number] Date of birth: 1956-02-18 Age: 61 y.o. Gender: female Date of Admission: 09/27/2016  Date of Discharge: 09/30/2016 Admitting Physician: Gwenlyn Fudge, MD  Primary Care Provider: Norm Salt, PA Consultants: None  Indication for Hospitalization: Shortness of breath  Discharge Diagnoses/Problem List:  Dyspnea Atrial fibrillation Morbid obesity Hepatitis C Hypertension  Disposition: Discharge home  Discharge Condition: Stable, improved  Discharge Exam:  General: awake, alert, tired appearing, NAD, breathing on RA Neck: assessment for JVD limited by neck girth  Cardiovascular: Seemingly regular rate, heart sounds difficult to assess, cardiac monitor w/ normal rate w p waves that are difficult to see. Respiratory: Able to speak in full sentences, diffuse expiratory wheezing without crackles or rhonchi Gastrointestinal: obese, soft, NT/ND, +BS MSK: moderate pitting edema to bilateral knees. LLE seems to be slightly more edematous compared to RLE.  Neuro: follows all commands, speech normal Psych: mood stable, affect appropriate  Brief Hospital Course:  Presented to emergency department with complaint of 5 days of worsening shortness of breath and bilateral lower extremity swelling and palpitations. BNP was elevated but less than 500 and lung exam showed no significant crackles.  Noted to be in atrial fibrillation, which is new for her. She was started on metoprolol 12.5 mg twice daily and initially was on heparin drip for anticoagulation. She remained rate controlled throughout admission and was transitioned onto xarelto 20 mg daily. Her dyspnea and atrial fibrillation who both felt to be related to her morbid obesity, probable OSA and possible OHS. She was being followed by cardiology during admission who recommended the beta blocker, anticoagulation  and recommend follow-up in 3 weeks for possible cardioversion. She remained comfortable on room air for the duration of admission. Discussed patient interest in bariatric surgery following discharge and she is very interested.   Issues for Follow Up:  1. Dyspnea: Likely secondary to obesity. Highly recommended outpatient sleep study  2. Atrial fibrillation: Rate controlled on metoprolol 12.5 mg twice daily and anticoagulated on xarleto 20 mg daily. Cardiology to schedule appointment in 3 weeks for possible cardioversion 3. Obesity: Signed patient up for bariatric surgery weight-loss seminar March 6 from 6 to 8 PM at [facility].   Significant Procedures: L lower extremity doppler:No evidence of deep vein thrombosis involving the visualized veinsof the left lower extremity.   Significant Labs and Imaging:   Recent Labs Lab 09/27/16 2019 09/29/16 0511 09/30/16 0418  WBC 4.3 4.9 5.4  HGB 9.7* 9.9* 10.0*  HCT 31.4* 31.6* 32.6*  PLT 96* 86* 83*    Recent Labs Lab 09/27/16 0932 09/27/16 2019 09/28/16 0357 09/29/16 0830  NA 143  --  144 145  K 4.7  --  4.5 4.0  CL 103  --  104 106  CO2 28  --  29 30  GLUCOSE 85  --  75 92  BUN 41*  --  39* 35*  CREATININE 1.18* 1.16* 1.18* 1.25*  CALCIUM 9.6  --  9.7 9.6  ALKPHOS  --   --  119  --   AST  --   --  33  --   ALT  --   --  18  --   ALBUMIN  --   --  3.0*  --     Results/Tests Pending at Time of Discharge: none  Discharge Medications:  Allergies as of 09/30/2016      Reactions   Penicillins Swelling  Has patient had a PCN reaction causing immediate rash, facial/tongue/throat swelling, SOB or lightheadedness with hypotension: Yes Has patient had a PCN reaction causing severe rash involving mucus membranes or skin necrosis: No Has patient had a PCN reaction that required hospitalization No Has patient had a PCN reaction occurring within the last 10 years: No If all of the above answers are "NO", then may proceed with  Cephalosporin use.      Medication List    TAKE these medications   aspirin 81 MG EC tablet Take 1 tablet (81 mg total) by mouth daily.   cholecalciferol 1000 units tablet Commonly known as:  VITAMIN D Take 1,000 Units by mouth daily.   furosemide 40 MG tablet Commonly known as:  LASIX Take 0.5 tablets (20 mg total) by mouth daily. What changed:  how much to take  when to take this   MAVYRET 100-40 MG Tabs Generic drug:  Glecaprevir-Pibrentasvir Take 3 tablets by mouth daily.   metoprolol tartrate 25 MG tablet Commonly known as:  LOPRESSOR Take 0.5 tablets (12.5 mg total) by mouth 2 (two) times daily.   polyethylene glycol packet Commonly known as:  MIRALAX / GLYCOLAX Take 17 g by mouth daily as needed for mild constipation.   rivaroxaban 20 MG Tabs tablet Commonly known as:  XARELTO Take 1 tablet (20 mg total) by mouth daily. Start taking on:  10/01/2016       Discharge Instructions: Please refer to Patient Instructions section of EMR for full details.  Patient was counseled important signs and symptoms that should prompt return to medical care, changes in medications, dietary instructions, activity restrictions, and follow up appointments.   Follow-Up Appointments: Please schedule follow up appointment with primary doctor  Renne Musca, MD 09/30/2016, 12:01 PM PGY-1, [facility] Family Medicine

## 2016-09-28 NOTE — Progress Notes (Signed)
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 Admitted by IM for a/c diastolic CHF and new onset atrial fibrillation  Inpatient Medications    Scheduled Meds: . aspirin  81 mg Oral Daily  . furosemide  40 mg Intravenous BID  . Glecaprevir-Pibrentasvir  3 tablet Oral Daily  . ipratropium-albuterol  3 mL Nebulization Once  . sodium chloride flush  3 mL Intravenous Q12H  . sodium chloride flush  3 mL Intravenous Q12H   Continuous Infusions: . heparin 1,500 Units/hr (09/28/16 0338)   PRN Meds: sodium chloride, acetaminophen, polyethylene glycol, sodium chloride flush   Vital Signs    Vitals:   09/28/16 0100 09/28/16 0404 09/28/16 0800 09/28/16 1211  BP: (!) 95/53 107/64 94/66 107/78  Pulse: 92 100 83 (!) 104  Resp: 18 18  18   Temp: 97.5 F (36.4 C) 97.5 F (36.4 C)  97.6 F (36.4 C)  TempSrc: Oral Oral  Oral  SpO2: 95% 95%  95%  Weight:  (!) 473 lb (214.6 kg)    Height:        Intake/Output Summary (Last 24 hours) at 09/28/16 1550 Last data filed at 09/28/16 1500  Gross per 24 hour  Intake          1110.25 ml  Output             1200 ml  Net           -89.75 ml   Filed Weights   09/27/16 0846 09/28/16 0404  Weight: (!) 480 lb (217.7 kg) (!) 473 lb (214.6 kg)    Telemetry    Atrial fib with controlled V respone  - Personally Reviewed  ECG     Physical Exam   GEN: No acute distress.  Morbid obesity Neck: No JVD Cardiac: Irreg. Irreg. , .  Respiratory: Clear to auscultation bilaterally. GI:  morbid obesity  MS: 1-2 + edema  Neuro:  Nonfocal  Psych: Normal affect   Labs    Chemistry Recent Labs Lab 09/27/16 0932 09/27/16 2019 09/28/16 0357  NA 143  --  144  K 4.7  --  4.5   CL 103  --  104  CO2 28  --  29  GLUCOSE 85  --  75  BUN 41*  --  39*  CREATININE 1.18* 1.16* 1.18*  CALCIUM 9.6  --  9.7  PROT  --   --  8.3*  ALBUMIN  --   --  3.0*  AST  --   --  33  ALT  --   --  18  ALKPHOS  --   --  119  BILITOT  --   --  0.9  GFRNONAA 48* 49* 48*  GFRAA 56* 57* 56*  ANIONGAP 12  --  11     Hematology Recent Labs Lab 09/27/16 0932 09/27/16 2019  WBC 4.9 4.3  RBC 4.02 3.74*  HGB 10.4* 9.7*  HCT 33.7* 31.4*  MCV 83.8 84.0  MCH 25.9* 25.9*  MCHC 30.9 30.9  RDW 17.7* 17.8*  PLT 115* 96*    Cardiac Enzymes Recent Labs Lab 09/27/16 0932 09/27/16 2019  TROPONINI <0.03 <0.03   No results for input(s): TROPIPOC in the last 168 hours.  BNP Recent Labs Lab 09/27/16 0932  BNP 297.2*     DDimer  Recent Labs Lab 09/27/16 0932  DDIMER 2.07*     Radiology    Dg Chest Port 1 View  Result Date: 09/27/2016 CLINICAL DATA:  Shortness of breath. EXAM: PORTABLE CHEST 1 VIEW COMPARISON:  August 29, 2016 FINDINGS: There is interstitial edema. There is cardiomegaly with pulmonary venous hypertension. No airspace consolidation. No adenopathy. There is degenerative change in each shoulder. IMPRESSION: Findings indicative of congestive heart failure. No airspace consolidation. Electronically Signed   By: Bretta Bang III M.D.   On: 09/27/2016 09:17    Cardiac Studies     Patient Profile     61 y.o. female with morbid obesity,  Chronic diastolic dysfunction, new atrial fib.   Assessment & Plan    1. Chronic diastolic CHF :   Complicated by her obesity Continue meds.  I suspect that most of her shortness of breath is due to her super morbid obesity.  2. Atrial fib : Her ventricular rate is fairly well controlled. I think that she needs chronic anticoagulation. I think that would be very difficult to do a cardioversion but we could consider that in the future. I would not do a transesophageal echo as I think this would be risky.  Because of  this she will need at least 3-4 weeks of anticoagulation prior to cardioversion.  It may be that our best approach is just to continue with rate control and chronic anticoagulation.   No new recommendations. We will sign off. Call for questions.  Signed, Kristeen Miss, MD  09/28/2016, 3:50 PM

## 2016-09-29 LAB — BASIC METABOLIC PANEL
Anion gap: 9 (ref 5–15)
BUN: 35 mg/dL — AB (ref 6–20)
CHLORIDE: 106 mmol/L (ref 101–111)
CO2: 30 mmol/L (ref 22–32)
CREATININE: 1.25 mg/dL — AB (ref 0.44–1.00)
Calcium: 9.6 mg/dL (ref 8.9–10.3)
GFR calc Af Amer: 52 mL/min — ABNORMAL LOW (ref >60)
GFR calc non Af Amer: 45 mL/min — ABNORMAL LOW (ref >60)
GLUCOSE: 92 mg/dL (ref 65–99)
Potassium: 4 mmol/L (ref 3.5–5.1)
SODIUM: 145 mmol/L (ref 135–145)

## 2016-09-29 LAB — CBC
HEMATOCRIT: 31.6 % — AB (ref 36.0–46.0)
HEMOGLOBIN: 9.9 g/dL — AB (ref 12.0–15.0)
MCH: 26.3 pg (ref 26.0–34.0)
MCHC: 31.3 g/dL (ref 30.0–36.0)
MCV: 83.8 fL (ref 78.0–100.0)
Platelets: 86 10*3/uL — ABNORMAL LOW (ref 150–400)
RBC: 3.77 MIL/uL — ABNORMAL LOW (ref 3.87–5.11)
RDW: 18.4 % — ABNORMAL HIGH (ref 11.5–15.5)
WBC: 4.9 10*3/uL (ref 4.0–10.5)

## 2016-09-29 LAB — HEPARIN LEVEL (UNFRACTIONATED): Heparin Unfractionated: 1.03 IU/mL — ABNORMAL HIGH (ref 0.30–0.70)

## 2016-09-29 MED ORDER — RIVAROXABAN 20 MG PO TABS
20.0000 mg | ORAL_TABLET | Freq: Every day | ORAL | Status: DC
  Administered 2016-09-29 – 2016-09-30 (×2): 20 mg via ORAL
  Filled 2016-09-29 (×2): qty 1

## 2016-09-29 MED ORDER — HEPARIN (PORCINE) IN NACL 100-0.45 UNIT/ML-% IJ SOLN: 1300.0000 [IU]/h | INTRAMUSCULAR | Status: DC

## 2016-09-29 MED ORDER — METOPROLOL TARTRATE 12.5 MG HALF TABLET
12.5000 mg | ORAL_TABLET | Freq: Two times a day (BID) | ORAL | Status: DC
  Administered 2016-09-29 – 2016-09-30 (×3): 12.5 mg via ORAL
  Filled 2016-09-29 (×3): qty 1

## 2016-09-29 NOTE — Progress Notes (Signed)
Heparin stopped per pharmacy, orders to restart at 0800 in Epic, report will be given to on-coming nurse, pt slept well during the night, NAD noted, bath given, no vaginal bleeding noted this am with catheter care.

## 2016-09-29 NOTE — Care Management Note (Addendum)
Case Management Note  Patient Details  Name: Zoe Cobb MRN: [id number] Date of Birth: [birth year]  Subjective/Objective:                    Action/Plan: CM will continue to follow for DCP; Hospital bed from [facility] has been delivered to the home with adjustments; Patient is currently with [facility] for [facility] as of 09/01/2016; Abelino Derrick   08/29/2016-Patient is wheelchair bound at home; has a personal care service that provides care ( nursing assistant) 35 hr a week; Primary Care Provider:VANSTORY, ASHLEY N, PA- she is with [facility] and the physician make monthly home visits. She has been in this program for 6 months; New onset CHF, patient could benefit from a Disease Management Program for CHF; HHC choice offered pt chose [facility]; [place] with [place] called for arrangements; She use [facility] for transportation; pharmacy of choice [facility]  and they deliver her medication to her home; CM will continue to follow for DCP  Expected Discharge Date:  Possibly 10/02/2016              Expected Discharge Plan:  Home w Home Health Services  In-House Referral:  Clinical Social Work  Discharge planning Services  CM Consult  Status of Service:  In process, will continue to follow  [name] [phone] 09/29/2016, 3:22 PM

## 2016-09-29 NOTE — Progress Notes (Signed)
Foley catheter d/c'd at 1430.  Patient offered assistance with toileting and patient stated she does not need to void yet.  Instructed to call for assistance when she feels urge to void.

## 2016-09-29 NOTE — Progress Notes (Signed)
Transitions of Care Pharmacy Note  Plan:  Educated on new medications metoprolol and xarelto  Addressed concerns regarding drug interactions with her Mavyret Follow-up to ensure patient gets home medications stored in main pharmacy at discharge  --------------------------------------------- Zoe Cobb is an 61 y.o. female who presents with a chief complaint of SOB. In anticipation of discharge, pharmacy has reviewed this patient's prior to admission medication history, as well as current inpatient medications listed per the [facility].  Current medication indications, dosing, frequency, and notable side effects reviewed with patient. Patient verbalized understanding of current inpatient medication regimen and is aware that the After Visit Summary when presented, will represent the most accurate medication list at discharge.   Alheli Alva expressed concerns regarding drug interactions with her Mavyret. I reassured her that the pharmacy and medicine teams are evaluating her medications for drug interactions. I suggested that she may see a change in her furosemide and metoprolol doses, as the primary team is likely still making adjustments to these medications. I reinforced her Xarelto teaching, which was already provided by another pharmacist earlier today. Ample time was offered for discussion with questions. Patient reports no further questions at the conclusion of our visit.   Assessment: Understanding of regimen: excellent Understanding of indications: good Potential of compliance: excellent Barriers to Obtaining Medications: No  Patient instructed to contact inpatient pharmacy team with further questions or concerns if needed.    Time spent preparing for discharge counseling: 25 min Time spent counseling patient: 20 min    Thank you for allowing pharmacy to be a part of this patient's care.  York Cerise, PharmD Pharmacy Resident  Pager [phone] 09/29/16 3:54 PM

## 2016-09-29 NOTE — Progress Notes (Signed)
Family Medicine Teaching Service Daily Progress Note Intern Pager: [phone]  Patient name: Zoe Cobb Medical record number: [id number] Date of birth: 1956-07-04 Age: 61 y.o. Gender: female  Primary Care Provider: Norm Salt, PA Consultants: Cardiology Code Status: Full code  Pt Overview and Major Events to Date:  1. Admitted to FMTS  Assessment and Plan:  Dyspnea, stable: Diffuse expiratory wheezing without crackles or rhonchi. Stable on RA.  - UOP -1.95L 24hrs on lasix IV BID - Strict I/Os - duoneb x1 and reassess  - remove foley when able - cardiology following; appreciate recs - needs outpatient sleep study given high liklihood of OHS/OSA - AM 2V CXR - LE [name]: No evidence of deep vein thrombosis involving the visualized veins of the left lower extremity.  New onset A-fib, rate controlled: Continued to be rate controlled overnight. Started on xarleto this AM. Asymptomatic, denies CP or significant SOB. Satting 95%RA.  Cardiology considering DCCV after 3 weeks anticoagulation.  - continue telemetry  - metoprolol 12.5mg  BID - cont xarelto 20mg  daily - cardiology following; appreciate recommendations  Morbid obesity:  Will discuss with POA and patient regarding weight loss and open the idea of bariatric surgery as this would greatly improve her life expenxtancy and symptoms.  - surgery c/s?  Hepatitis C: Stable on mavyret - continue home regimen  AKI: Baseline at last DC was 1.1 and most recently elevated to 1.18.  - BMP - transition to PO lasix  HTN: Controlled with lasix daily. WNL - cont home regimen  FEN/GI: SLIV, heart healthy PPx: heparin drip  Disposition: SNF?  Subjective:  Feeling well this morning. Denies CP or SOB.    Objective: Temp:  [97.6 F (36.4 C)] 97.6 F (36.4 C) (02/23 0500) Pulse Rate:  [85-104] 85 (02/23 0500) Resp:  [18] 18 (02/22 2041) BP: (107-130)/(78-81) 118/80 (02/23 0500) SpO2:  [95 %-96 %] 96 % (02/23 0500) Physical  Exam: General: awake, alert, tired appearing, NAD, breathing on RA Neck: assessment for JVD limited by neck girth  Cardiovascular: Seemingly regular rate, heart sounds difficult to assess, cardiac monitor w/ normal rate w p waves that are difficult to see. Respiratory: Able to speak in full sentences, diffuse expiratory wheezing without crackles or rhonchi Gastrointestinal: obese, soft, NT/ND, +BS MSK: moderate pitting edema to bilateral knees.  LLE seems to be slightly more edematous compared to RLE.  Neuro: follows all commands, speech normal Psych: mood stable, affect appropriate  Laboratory:  Recent Labs Lab 09/27/16 0932 09/27/16 2019 09/29/16 0511  WBC 4.9 4.3 4.9  HGB 10.4* 9.7* 9.9*  HCT 33.7* 31.4* 31.6*  PLT 115* 96* 86*    Recent Labs Lab 09/27/16 0932 09/27/16 2019 09/28/16 0357  NA 143  --  144  K 4.7  --  4.5  CL 103  --  104  CO2 28  --  29  BUN 41*  --  39*  CREATININE 1.18* 1.16* 1.18*  CALCIUM 9.6  --  9.7  PROT  --   --  8.3*  BILITOT  --   --  0.9  ALKPHOS  --   --  119  ALT  --   --  18  AST  --   --  33  GLUCOSE 85  --  75    Imaging/Diagnostic Tests: No results found.  Renne Musca, MD 09/29/2016, 8:04 AM PGY-1, [facility] Family Medicine FPTS Intern pager: [phone], text pages welcome

## 2016-09-29 NOTE — Progress Notes (Signed)
Patient c/o swelling and tightness in her left hand. Left hand is larger than right.  Informed Riccio via telephone and orders given to elevate left hand and apply ice.  Both done.

## 2016-09-29 NOTE — Progress Notes (Signed)
ANTICOAGULATION CONSULT NOTE  Pharmacy Consult for heparin Indication: atrial fibrillation  Allergies  Allergen Reactions  . Penicillins Swelling    Has patient had a PCN reaction causing immediate rash, facial/tongue/throat swelling, SOB or lightheadedness with hypotension: Yes Has patient had a PCN reaction causing severe rash involving mucus membranes or skin necrosis: No Has patient had a PCN reaction that required hospitalization No Has patient had a PCN reaction occurring within the last 10 years: No If all of the above answers are "NO", then may proceed with Cephalosporin use.     Patient Measurements: Height: 5\' 2"  (157.5 cm) Weight: (!) 473 lb (214.6 kg) (scale b) IBW/kg (Calculated) : 50.1 Heparin Dosing Weight: 109kg  Vital Signs: Temp: 97.6 F (36.4 C) (02/23 0500) Temp Source: Oral (02/23 0500) BP: 118/80 (02/23 0500) Pulse Rate: 85 (02/23 0500)  Labs:  Recent Labs  09/27/16 0932 09/27/16 2019 09/28/16 0357 09/29/16 0511  HGB 10.4* 9.7*  --  9.9*  HCT 33.7* 31.4*  --  31.6*  PLT 115* 96*  --  86*  LABPROT 14.9  --   --   --   INR 1.16  --   --   --   HEPARINUNFRC  --  0.67 0.56 1.03*  CREATININE 1.18* 1.16* 1.18*  --   TROPONINI <0.03 <0.03  --   --     Estimated Creatinine Clearance: 90.4 mL/min (by C-G formula based on SCr of 1.18 mg/dL (H)).   Assessment: 60 yof on heparin for new onset afib. Heparin level now up to supratherapeutic (1.03) on gtt at 1500 units/hr. Verified that heparin level was drawn from arm opposite where heparin infusing. No bleeding noted. Hgb low but stable, plt down to 86.  Goal of Therapy:  Heparin level 0.3-0.7 units/ml Monitor platelets by anticoagulation protocol: Yes   Plan:  Hold heparin x 1 hour Restart heparin at 1300 units/hr F/u 6 hr heparin level  Christoper Fabian, PharmD, BCPS Clinical pharmacist, pager [phone] 09/29/2016,6:41 AM

## 2016-09-30 LAB — CBC
HEMATOCRIT: 32.6 % — AB (ref 36.0–46.0)
HEMOGLOBIN: 10 g/dL — AB (ref 12.0–15.0)
MCH: 25.9 pg — ABNORMAL LOW (ref 26.0–34.0)
MCHC: 30.7 g/dL (ref 30.0–36.0)
MCV: 84.5 fL (ref 78.0–100.0)
Platelets: 83 10*3/uL — ABNORMAL LOW (ref 150–400)
RBC: 3.86 MIL/uL — AB (ref 3.87–5.11)
RDW: 18.3 % — ABNORMAL HIGH (ref 11.5–15.5)
WBC: 5.4 10*3/uL (ref 4.0–10.5)

## 2016-09-30 MED ORDER — METOPROLOL TARTRATE 25 MG PO TABS: 12.5000 mg | ORAL_TABLET | Freq: Two times a day (BID) | ORAL | 0 refills | Status: DC

## 2016-09-30 MED ORDER — FUROSEMIDE 40 MG PO TABS: 20.0000 mg | ORAL_TABLET | Freq: Every day | ORAL | 1 refills | Status: DC

## 2016-09-30 MED ORDER — RIVAROXABAN 20 MG PO TABS: 20.0000 mg | ORAL_TABLET | Freq: Every day | ORAL | 0 refills | Status: DC

## 2016-09-30 NOTE — Discharge Instructions (Signed)
You were admitted for shortness of breath very likely due to your weight.  You also had an abnormal heart rhythm called atrial fibrillation.  We prescribed you some new medicine called metoprolol and xarelto.  For the metoprolol you will take 12.5mg  two times daily and the xarelto take 20mg  daily.  Cardiology (heart doctors) will contact you to schedule an appointment in a few weeks to possibly shock your heart to fix the rhythm.  I would also HIGHLY recommend you get a sleep study when you leave the hospital to evaluate for sleep apnea.   We also discussed possibility of bariatric surgery.  I signed you up for a bariatric surgery weight loss seminar.  This is on march 6th from 6pm to 8PM at [facility]. Address is [address] [place], [place], [place] [address]    Information on my medicine - XARELTO (Rivaroxaban)  This medication education was reviewed with me or my healthcare representative as part of my discharge preparation.  The pharmacist that spoke with me during my hospital stay was:  Allena Katz, [facility]  Why was Xarelto prescribed for you? Xarelto was prescribed for you to reduce the risk of a blood clot forming that can cause a stroke if you have a medical condition called atrial fibrillation (a type of irregular heartbeat).  What do you need to know about xarelto ? Take your Xarelto ONCE DAILY at the same time every day with your evening meal. If you have difficulty swallowing the tablet whole, you may crush it and mix in applesauce just prior to taking your dose.  Take Xarelto exactly as prescribed by your doctor and DO NOT stop taking Xarelto without talking to the doctor who prescribed the medication.  Stopping without other stroke prevention medication to take the place of Xarelto may increase your risk of developing a clot that causes a stroke.  Refill your prescription before you run out.  After discharge, you should have regular check-up  appointments with your healthcare provider that is prescribing your Xarelto.  In the future your dose may need to be changed if your kidney function or weight changes by a significant amount.  What do you do if you miss a dose? If you are taking Xarelto ONCE DAILY and you miss a dose, take it as soon as you remember on the same day then continue your regularly scheduled once daily regimen the next day. Do not take two doses of Xarelto at the same time or on the same day.   Important Safety Information A possible side effect of Xarelto is bleeding. You should call your healthcare provider right away if you experience any of the following: ? Bleeding from an injury or your nose that does not stop. ? Unusual colored urine (red or dark brown) or unusual colored stools (red or black). ? Unusual bruising for unknown reasons. ? A serious fall or if you hit your head (even if there is no bleeding).  Some medicines may interact with Xarelto and might increase your risk of bleeding while on Xarelto. To help avoid this, consult your healthcare provider or pharmacist prior to using any new prescription or non-prescription medications, including herbals, vitamins, non-steroidal anti-inflammatory drugs (NSAIDs) and supplements.  This website has more information on Xarelto: VisitDestination.com.br.

## 2016-09-30 NOTE — Care Management Note (Signed)
Case Management Note  Patient Details  Name: Zoe Cobb MRN: [id number] Date of Birth: 1955-10-25  Subjective/Objective:                    Action/Plan: Called [facility] for Non emergency Transport home.    Expected Discharge Date:  09/30/16               Expected Discharge Plan:  Home w Home Health Services  In-House Referral:  Clinical Social Work  Discharge planning Services  CM Consult  Post Acute Care Choice:    Choice offered to:  Patient  DME Arranged:    DME Agency:     HH Arranged:    HH Agency:     Status of Service:  Completed, signed off  If discussed at [employer], dates discussed:    Additional Comments:  Yvone Neu, RN 09/30/2016, 2:16 PM

## 2016-09-30 NOTE — Progress Notes (Signed)
Pt got discharged to home, discharge instructions provided and patient showed understanding to it, IV taken out,Telemonitor DC,pt left unit via EMS with all of the belongings in a stable condition

## 2016-10-12 ENCOUNTER — Inpatient Hospital Stay (HOSPITAL_COMMUNITY)
Admission: EM | Admit: 2016-10-12 | Discharge: 2016-10-18 | DRG: 292 | Disposition: A | Discharge status: Home Health | Payer: Medicaid Other | Attending: Family Medicine | Admitting: Family Medicine

## 2016-10-12 ENCOUNTER — Encounter (HOSPITAL_COMMUNITY): Payer: Self-pay | Admitting: Emergency Medicine

## 2016-10-12 ENCOUNTER — Emergency Department (HOSPITAL_COMMUNITY): Payer: Medicaid Other

## 2016-10-12 DIAGNOSIS — Z6841 Body Mass Index (BMI) 40.0 and over, adult: Secondary | ICD-10-CM

## 2016-10-12 DIAGNOSIS — Z7982 Long term (current) use of aspirin: Secondary | ICD-10-CM

## 2016-10-12 DIAGNOSIS — I959 Hypotension, unspecified: Secondary | ICD-10-CM | POA: Diagnosis not present

## 2016-10-12 DIAGNOSIS — E662 Morbid (severe) obesity with alveolar hypoventilation: Secondary | ICD-10-CM | POA: Diagnosis present

## 2016-10-12 DIAGNOSIS — Z8249 Family history of ischemic heart disease and other diseases of the circulatory system: Secondary | ICD-10-CM

## 2016-10-12 DIAGNOSIS — I5032 Chronic diastolic (congestive) heart failure: Secondary | ICD-10-CM

## 2016-10-12 DIAGNOSIS — D696 Thrombocytopenia, unspecified: Secondary | ICD-10-CM | POA: Diagnosis present

## 2016-10-12 DIAGNOSIS — Z993 Dependence on wheelchair: Secondary | ICD-10-CM | POA: Diagnosis not present

## 2016-10-12 DIAGNOSIS — Z880 Allergy status to penicillin: Secondary | ICD-10-CM | POA: Diagnosis not present

## 2016-10-12 DIAGNOSIS — Z87891 Personal history of nicotine dependence: Secondary | ICD-10-CM

## 2016-10-12 DIAGNOSIS — R0602 Shortness of breath: Secondary | ICD-10-CM | POA: Diagnosis not present

## 2016-10-12 DIAGNOSIS — B1920 Unspecified viral hepatitis C without hepatic coma: Secondary | ICD-10-CM | POA: Diagnosis present

## 2016-10-12 DIAGNOSIS — I272 Pulmonary hypertension, unspecified: Secondary | ICD-10-CM | POA: Diagnosis present

## 2016-10-12 DIAGNOSIS — I5033 Acute on chronic diastolic (congestive) heart failure: Secondary | ICD-10-CM

## 2016-10-12 DIAGNOSIS — R609 Edema, unspecified: Secondary | ICD-10-CM | POA: Diagnosis not present

## 2016-10-12 DIAGNOSIS — Z7901 Long term (current) use of anticoagulants: Secondary | ICD-10-CM

## 2016-10-12 DIAGNOSIS — I509 Heart failure, unspecified: Secondary | ICD-10-CM | POA: Diagnosis not present

## 2016-10-12 DIAGNOSIS — I110 Hypertensive heart disease with heart failure: Secondary | ICD-10-CM | POA: Diagnosis not present

## 2016-10-12 DIAGNOSIS — I481 Persistent atrial fibrillation: Secondary | ICD-10-CM | POA: Diagnosis present

## 2016-10-12 DIAGNOSIS — I4891 Unspecified atrial fibrillation: Secondary | ICD-10-CM

## 2016-10-12 DIAGNOSIS — B1910 Unspecified viral hepatitis B without hepatic coma: Secondary | ICD-10-CM | POA: Diagnosis present

## 2016-10-12 DIAGNOSIS — Z833 Family history of diabetes mellitus: Secondary | ICD-10-CM | POA: Diagnosis not present

## 2016-10-12 DIAGNOSIS — Z7401 Bed confinement status: Secondary | ICD-10-CM

## 2016-10-12 LAB — CBC WITH DIFFERENTIAL/PLATELET
Basophils Absolute: 0 10*3/uL (ref 0.0–0.1)
Basophils Relative: 0 %
EOS ABS: 0.1 10*3/uL (ref 0.0–0.7)
Eosinophils Relative: 3 %
HCT: 31.7 % — ABNORMAL LOW (ref 36.0–46.0)
HEMOGLOBIN: 9.6 g/dL — AB (ref 12.0–15.0)
LYMPHS ABS: 1.3 10*3/uL (ref 0.7–4.0)
Lymphocytes Relative: 26 %
MCH: 25.8 pg — AB (ref 26.0–34.0)
MCHC: 30.3 g/dL (ref 30.0–36.0)
MCV: 85.2 fL (ref 78.0–100.0)
MONOS PCT: 11 %
Monocytes Absolute: 0.6 10*3/uL (ref 0.1–1.0)
Neutro Abs: 2.9 10*3/uL (ref 1.7–7.7)
Neutrophils Relative %: 60 %
PLATELETS: 122 10*3/uL — AB (ref 150–400)
RBC: 3.72 MIL/uL — ABNORMAL LOW (ref 3.87–5.11)
RDW: 18.9 % — ABNORMAL HIGH (ref 11.5–15.5)
WBC: 4.9 10*3/uL (ref 4.0–10.5)

## 2016-10-12 LAB — BASIC METABOLIC PANEL
Anion gap: 7 (ref 5–15)
BUN: 29 mg/dL — AB (ref 6–20)
CHLORIDE: 106 mmol/L (ref 101–111)
CO2: 29 mmol/L (ref 22–32)
CREATININE: 0.93 mg/dL (ref 0.44–1.00)
Calcium: 8.9 mg/dL (ref 8.9–10.3)
GFR calc Af Amer: >60 mL/min (ref >60)
GFR calc non Af Amer: >60 mL/min (ref >60)
GLUCOSE: 95 mg/dL (ref 65–99)
Potassium: 3.9 mmol/L (ref 3.5–5.1)
SODIUM: 142 mmol/L (ref 135–145)

## 2016-10-12 LAB — HEPATIC FUNCTION PANEL
ALK PHOS: 115 U/L (ref 38–126)
ALT: 20 U/L (ref 14–54)
AST: 34 U/L (ref 15–41)
Albumin: 2.9 g/dL — ABNORMAL LOW (ref 3.5–5.0)
BILIRUBIN DIRECT: 0.3 mg/dL (ref 0.1–0.5)
BILIRUBIN TOTAL: 1 mg/dL (ref 0.3–1.2)
Indirect Bilirubin: 0.7 mg/dL (ref 0.3–0.9)
Total Protein: 8.5 g/dL — ABNORMAL HIGH (ref 6.5–8.1)

## 2016-10-12 LAB — I-STAT ARTERIAL BLOOD GAS, ED
ACID-BASE EXCESS: 6 mmol/L — AB (ref 0.0–2.0)
BICARBONATE: 31 mmol/L — AB (ref 20.0–28.0)
O2 Saturation: 94 %
PCO2 ART: 48.4 mmHg — AB (ref 32.0–48.0)
PO2 ART: 69 mmHg — AB (ref 83.0–108.0)
Patient temperature: 98.6
TCO2: 32 mmol/L (ref 0–100)
pH, Arterial: 7.415 (ref 7.350–7.450)

## 2016-10-12 LAB — I-STAT TROPONIN, ED: TROPONIN I, POC: 0 ng/mL (ref 0.00–0.08)

## 2016-10-12 LAB — MRSA PCR SCREENING: MRSA by PCR: NEGATIVE

## 2016-10-12 LAB — PROTIME-INR
INR: 2.14
Prothrombin Time: 24.3 seconds — ABNORMAL HIGH (ref 11.4–15.2)

## 2016-10-12 LAB — BRAIN NATRIURETIC PEPTIDE: B Natriuretic Peptide: 387.4 pg/mL — ABNORMAL HIGH (ref 0.0–100.0)

## 2016-10-12 LAB — TROPONIN I
Troponin I: 0.15 ng/mL (ref <0.03)
Troponin I: <0.03 ng/mL (ref <0.03)

## 2016-10-12 MED ORDER — FUROSEMIDE 10 MG/ML IJ SOLN: 40.0000 mg | Freq: Every day | INTRAMUSCULAR | Status: DC

## 2016-10-12 MED ORDER — SODIUM CHLORIDE 0.9% FLUSH
3.0000 mL | Freq: Two times a day (BID) | INTRAVENOUS | Status: DC
  Administered 2016-10-12 – 2016-10-18 (×9): 3 mL via INTRAVENOUS

## 2016-10-12 MED ORDER — METOPROLOL TARTRATE 12.5 MG HALF TABLET
12.5000 mg | ORAL_TABLET | Freq: Two times a day (BID) | ORAL | Status: DC
  Administered 2016-10-13 – 2016-10-16 (×7): 12.5 mg via ORAL
  Filled 2016-10-12 (×9): qty 1

## 2016-10-12 MED ORDER — IPRATROPIUM-ALBUTEROL 0.5-2.5 (3) MG/3ML IN SOLN
3.0000 mL | Freq: Once | RESPIRATORY_TRACT | Status: DC
  Filled 2016-10-12: qty 3

## 2016-10-12 MED ORDER — VITAMIN D 1000 UNITS PO TABS
1000.0000 [IU] | ORAL_TABLET | Freq: Every day | ORAL | Status: DC
  Administered 2016-10-13 – 2016-10-18 (×6): 1000 [IU] via ORAL
  Filled 2016-10-12 (×6): qty 1

## 2016-10-12 MED ORDER — ACETAMINOPHEN 325 MG PO TABS: 650.0000 mg | ORAL_TABLET | Freq: Four times a day (QID) | ORAL | Status: DC | PRN

## 2016-10-12 MED ORDER — RIVAROXABAN 20 MG PO TABS
20.0000 mg | ORAL_TABLET | Freq: Every day | ORAL | Status: DC
  Administered 2016-10-13 – 2016-10-18 (×6): 20 mg via ORAL
  Filled 2016-10-12 (×6): qty 1

## 2016-10-12 MED ORDER — POLYETHYLENE GLYCOL 3350 17 G PO PACK
17.0000 g | PACK | Freq: Every day | ORAL | Status: DC | PRN
  Administered 2016-10-15 – 2016-10-16 (×2): 17 g via ORAL
  Filled 2016-10-12 (×2): qty 1

## 2016-10-12 MED ORDER — IPRATROPIUM-ALBUTEROL 0.5-2.5 (3) MG/3ML IN SOLN
3.0000 mL | Freq: Four times a day (QID) | RESPIRATORY_TRACT | Status: DC
  Administered 2016-10-12: 3 mL via RESPIRATORY_TRACT

## 2016-10-12 MED ORDER — ACETAMINOPHEN 650 MG RE SUPP
650.0000 mg | Freq: Four times a day (QID) | RECTAL | Status: DC | PRN
Start: 2016-10-12 — End: 2016-10-18

## 2016-10-12 MED ORDER — IPRATROPIUM-ALBUTEROL 0.5-2.5 (3) MG/3ML IN SOLN: 3.0000 mL | Freq: Four times a day (QID) | RESPIRATORY_TRACT | Status: DC | PRN

## 2016-10-12 MED ORDER — ASPIRIN EC 81 MG PO TBEC
81.0000 mg | DELAYED_RELEASE_TABLET | Freq: Every day | ORAL | Status: DC
  Administered 2016-10-13 – 2016-10-18 (×6): 81 mg via ORAL
  Filled 2016-10-12 (×6): qty 1

## 2016-10-12 MED ORDER — FUROSEMIDE 10 MG/ML IJ SOLN
40.0000 mg | INTRAMUSCULAR | Status: AC
  Administered 2016-10-12: 40 mg via INTRAVENOUS
  Filled 2016-10-12: qty 4

## 2016-10-12 NOTE — H&P (Signed)
Family Medicine Teaching [facility] Admission History and Physical Service Pager: [phone]  Patient name: Zoe Cobb Medical record number: [id number] Date of birth: 1956/04/26 Age: 61 y.o. Gender: female  Primary Care Provider: Norm Salt, PA Consultants: None  Code Status: Full   Chief Complaint: SOB and worsening leg swelling   Assessment and Plan: Zoe Cobb is a 61 y.o. female presenting with SOB and worsening leg swelling. PMH is significant for HFpEF, Grade 2 DD; Morbid obesity, Hep C, HTN  #Dyspnea: Likely with mixed CHF exacerbation/ Obesity hypoventilation syndrome. Significant volume overload noted on exam with pitting edema on lower extremities and 2+ noted on thigh, chest x-ray significant for pulmonary vascular congestion with a BNP of 387. Differential includes PE vs CAD. EKG without any ischemic changes, A. fib with rate control, troponin negative 1. The patient is at higher risk for PE given she is immobile unlikely as patient is on Xarelto and takes this regularly. ABG less concern for PE, given slight hypercarbia and normal PaO2.  -Admit to inpatient telemetry, attending Dr. Gordy Levan - Awaiting weight, discussed with ED nurse  -Diuresis with IV Lasix, consider at discharge torsemide versus increased dose of Lasix -Trend troponins x3 - Received 40 IV lasix in the ED,  - AM EKG - AM CBC and BMET - Will get ABG, if hypercapnic could consider CPAP on discharge -Daily weights  -Strict I's and O's -Will need a foley catheter to record accurately -PT/OT eval - Pulse oximetry  #HpEF: Echo in January 2018 with LVEF of 55-60%. G2DD. PA peak pressure 42 mmHg - Aspirin 80 mg  - Lasix 40 IV  - Recent lipid panel and A1C in January - ASCVD risk is low 3.0%  #Wheezing: Remote hx of smoking more than 20 years ago making COPD less likely. PA pressure 42 mmHg, noting some pulmonary HTN mostly likely due to OHS, no history of sleep study.  - Duonebs every 6 hrs  -  Will obtain ABG  - Might consider starting on night time CPAP   #A-fib; EKG with afib. Has not seen cardiology yet . TSH slight elevated, T4 wnl.  - Continue Xarelto and metoprolol 12.5 BID   # HTN: controlled on Lasix alone - Monitor closely as she is diuresed  # Hep C: on Mavyret at home. Per patient finished last dose of treatment last Friday  - LFTs  - Will not continue MAVYRET - CMP in am  #Thrombocytopenia (noted since January): Platelets of 122, history of hepatitis C - Will check LFTs and PT/INR  # Morbid obesity: Patient is unable to move and requires a Hoyer lift to be placed in wheelchair. Per discussion with the ED provider sister was previously in the room and stated that she is worried about caring for patient in the future. She states it took 3 people to move patient to get to the hospital today. - Will consult social work   FEN/GI: None  Prophylaxis:Xarelto   Disposition: Home vs. SNF  History of Present Illness:  Zoe Cobb is a 61 y.o. female presenting with with shortness of breath and worsening swelling in her bilateral lower extremities. Patient states that she has been progressively worsening fluid overload over the past 2 weeks since her discharge from the hospital on September 30, 2016. States  that she was discharged with 40 of Lasix;  20 in the AM and 20 in the PM. She has been taking this regularly, however has not been diuresing the way she was  previously in the hospital. Patient indicates on Monday she started to notice significant tightness in her legs, over the next couple of days she had increasing shortness of breath. The patient is bedbound due to significant morbid obesity and an Michiel Sites lift is needed to move her, therefore she only notices shortness of breath while at rest. She also indicates having significant wheezing which usually worsens when she has fluid on her. Indicates having some intermittent chest pain that is across her lower chest, no  radiation, no diaphoresis, no nausea associated with this chest pain. Patient is currently on Xarelto for A. fib. She has been taking her a Xarelto regularly and has not missed a single dose. Patient denies any hemoptysis, cough, fevers or chills. Indicates she has noted some palpitations on Wednesday that resolved after a few minutes.  Previously seen on February 24 for new onset A. fib and shortness of breath treated for CHF exacerbation. Lasix IV 40 today in the ED.   Review Of Systems: Per HPI with the following additions:   Review of Systems  Constitutional: Negative for chills and fever.  HENT: Negative for congestion.   Eyes: Negative for blurred vision and double vision.  Respiratory: Positive for shortness of breath and wheezing. Negative for cough, hemoptysis and sputum production.   Cardiovascular: Positive for chest pain, palpitations, orthopnea and leg swelling.  Gastrointestinal: Negative for nausea and vomiting.  Genitourinary: Negative for dysuria and urgency.  Musculoskeletal: Negative for myalgias and neck pain.  Neurological: Negative for dizziness and headaches.  Psychiatric/Behavioral: Negative for depression.    Patient Active Problem List   Diagnosis Date Noted  . CHF exacerbation (HCC) 09/27/2016  . Swelling of left lower extremity 09/27/2016  . Atrial fibrillation, new onset (HCC) 09/27/2016  . AKI (acute kidney injury) (HCC) 09/27/2016  . CHF (congestive heart failure) (HCC) 08/28/2016  . Shortness of breath 08/28/2016  . Pressure injury of skin 08/28/2016  . Morbid obesity due to excess calories (HCC) 06/28/2015    Past Medical History: Past Medical History:  Diagnosis Date  . CHF (congestive heart failure) (HCC)   . Hypertension   . Morbid obesity (HCC)     Past Surgical History: History reviewed. No pertinent surgical history.  Social History: Social History  Substance Use Topics  . Smoking status: Former Smoker    Types: Cigarettes  .  Smokeless tobacco: Never Used  . Alcohol use No     Comment: once in a while    Please also refer to relevant sections of EMR.  Family History: Family History  Problem Relation Age of Onset  . Hypertension Mother   . Diabetes Mother      Allergies and Medications: Allergies  Allergen Reactions  . Penicillins Swelling    Has patient had a PCN reaction causing immediate rash, facial/tongue/throat swelling, SOB or lightheadedness with hypotension: Yes Has patient had a PCN reaction causing severe rash involving mucus membranes or skin necrosis: No Has patient had a PCN reaction that required hospitalization No Has patient had a PCN reaction occurring within the last 10 years: No If all of the above answers are "NO", then may proceed with Cephalosporin use.    No current facility-administered medications on file prior to encounter.    Current Outpatient Prescriptions on File Prior to Encounter  Medication Sig Dispense Refill  . aspirin EC 81 MG EC tablet Take 1 tablet (81 mg total) by mouth daily. 30 tablet 2  . cholecalciferol (VITAMIN D) 1000 units tablet Take  1,000 Units by mouth daily.    . furosemide (LASIX) 40 MG tablet Take 0.5 tablets (20 mg total) by mouth daily. 30 tablet 1  . MAVYRET 100-40 MG TABS Take 3 tablets by mouth daily.   1  . metoprolol tartrate (LOPRESSOR) 25 MG tablet Take 0.5 tablets (12.5 mg total) by mouth 2 (two) times daily. 60 tablet 0  . polyethylene glycol (MIRALAX / GLYCOLAX) packet Take 17 g by mouth daily as needed for mild constipation. 14 each 0  . rivaroxaban (XARELTO) 20 MG TABS tablet Take 1 tablet (20 mg total) by mouth daily. 30 tablet 0    Objective: BP 103/71 (BP Location: Right Arm)   Pulse 88   Resp 22   SpO2 96%  Exam: General: Morbidly obese female, NAD, on room air  Eyes: PERRLA, EOMI ENTM: Moist mucosa membranes  Neck: No lymphadenopathy, no thyromegaly  Cardiovascular:RRR, no murmur  Respiratory: Wheezing through, no  increased WOB, no crackles, decrease bibasilar breath sounds  Gastrointestinal: BS+, non-tender,  MSK: 2+ bilateral edema, 2+ thigh edema  Derm: no rashes or wounds noted  Neuro:  Cn 2-7 intact Strength equal & normal in upper & lower extremities  finger to nose  Psych: normal mood and affect   Labs and Imaging: CBC BMET   Recent Labs Lab 10/12/16 0932  WBC 4.9  HGB 9.6*  HCT 31.7*  PLT 122*    Recent Labs Lab 10/12/16 0932  NA 142  K 3.9  CL 106  CO2 29  BUN 29*  CREATININE 0.93  GLUCOSE 95  CALCIUM 8.9     BNP (last 3 results)  Recent Labs  08/28/16 0950 09/27/16 0932 10/12/16 0932  BNP 220.1* 297.2* 387.4*    ABG    Component Value Date/Time   PHART 7.415 10/12/2016 1215   PCO2ART 48.4 (H) 10/12/2016 1215   PO2ART 69.0 (L) 10/12/2016 1215   HCO3 31.0 (H) 10/12/2016 1215   TCO2 32 10/12/2016 1215   O2SAT 94.0 10/12/2016 1215    Dg Chest 2 View  Result Date: 10/12/2016 CLINICAL DATA:  Shortness of breath for 4 days. EXAM: CHEST  2 VIEW COMPARISON:  Single-view of the chest 09/27/2016 and 08/29/2016. FINDINGS: There is marked cardiomegaly with pulmonary vascular congestion. No consolidative process, pneumothorax or effusion. No acute bony abnormality. IMPRESSION: Cardiomegaly and pulmonary vascular congestion. Electronically Signed   By: Drusilla Kanner M.D.   On: 10/12/2016 10:26     Mayra Reel, MD 10/12/2016, 11:21 AM PGY-2, [facility] FPTS Intern pager: [phone], text pages welcome

## 2016-10-12 NOTE — ED Notes (Signed)
Transported to Ultrasound

## 2016-10-12 NOTE — Progress Notes (Signed)
Py is eating with no distress, no chest pain, Troponin 0.15 Paged MD MRSA

## 2016-10-12 NOTE — ED Notes (Signed)
Report attempted. At extension [phone]

## 2016-10-12 NOTE — ED Triage Notes (Signed)
Pt here from with c/o bil leg swelling , pt was here in the 1 week ago for the same ,

## 2016-10-12 NOTE — ED Notes (Signed)
Lunch tray ordered; heart healthy 

## 2016-10-12 NOTE — ED Notes (Signed)
Requested bariatric bed

## 2016-10-12 NOTE — ED Notes (Signed)
Pt back from X-ray.  

## 2016-10-12 NOTE — Progress Notes (Signed)
Pt could not chew meat called for a soft sandwich

## 2016-10-12 NOTE — Progress Notes (Signed)
Pt is alert and oriented with SOB at Rest, Bilateral wheezing, and leg swelling.

## 2016-10-12 NOTE — ED Provider Notes (Signed)
[facility]-EMERGENCY DEPT Provider Note   CSN: [id number] Arrival date & time: 10/12/16  0850     History   Chief Complaint Chief Complaint  Patient presents with  . Leg Swelling    HPI Zoe Cobb is a 61 y.o. female.  The history is provided by the patient and medical records.    61 year old female with history of A. fib, CHF, hypertension, morbid obesity, presenting to the ED for bilateral lower extremity edema.  Patient was admitted on 09/27/2016 for similar symptoms with new onset A. fib. Family reports while in the hospital she was diuresing well with IV lasix and swelling in her legs seem to go way down. Reports her breathing seemed better and she was able to rest comfortably.  States since being discharged home on oral Lasix, sister reports she does not seem to be diuresing not well. Patient reports she started having some intermittent shortness of breath, worse with any attempted activity or lying flat. She has been sleeping on several pillows. She does report some mild chest pressure but denies any palpitations, dizziness, or weakness. She does not use home oxygen. 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  Patient has been taking her Lasix, 40 mg daily divided 20mg  in the morning and evening as directed but does not feel it is working.  Patient does not ambulate at home.  Sister and family uses hoyer lift to get her from bed to wheelchair and back and forth.  Past Medical History:  Diagnosis Date  . CHF (congestive heart failure) (HCC)   . Hypertension   . Morbid obesity [facility])     Patient Active Problem List   Diagnosis Date Noted  . CHF exacerbation (HCC) 09/27/2016  . Swelling of left lower extremity 09/27/2016  . Atrial fibrillation, new onset (HCC) 09/27/2016  . AKI (acute kidney injury) (HCC) 09/27/2016  . CHF (congestive heart failure) (HCC) 08/28/2016  . Shortness of breath 08/28/2016  . Pressure injury of skin 08/28/2016  . Morbid obesity due  to excess calories (HCC) 06/28/2015    History reviewed. No pertinent surgical history.  OB History    No data available       Home Medications    Prior to Admission medications   Medication Sig Start Date End Date Taking? Authorizing Provider  aspirin EC 81 MG EC tablet Take 1 tablet (81 mg total) by mouth daily. 08/31/16   Almon Hercules, MD  cholecalciferol (VITAMIN D) 1000 units tablet Take 1,000 Units by mouth daily.    Historical Provider, MD  furosemide (LASIX) 40 MG tablet Take 0.5 tablets (20 mg total) by mouth daily. 09/30/16   Renne Musca, MD  MAVYRET 100-40 MG TABS Take 3 tablets by mouth daily.  08/16/16   Historical Provider, MD  metoprolol tartrate (LOPRESSOR) 25 MG tablet Take 0.5 tablets (12.5 mg total) by mouth 2 (two) times daily. 09/30/16   Renne Musca, MD  polyethylene glycol [facility] Ethelene Hal) packet Take 17 g by mouth daily as needed for mild constipation. 08/30/16   Almon Hercules, MD  rivaroxaban (XARELTO) 20 MG TABS tablet Take 1 tablet (20 mg total) by mouth daily. 10/01/16   Renne Musca, MD    Family History Family History  Problem Relation Age of Onset  . Hypertension Mother   . Diabetes Mother     Social History Social History  Substance Use Topics  . Smoking status: Former Smoker    Types: Cigarettes  . Smokeless tobacco:  Never Used  . Alcohol use No     Comment: once in a while     Allergies   Penicillins   Review of Systems Review of Systems  Respiratory: Positive for shortness of breath.   Cardiovascular: Positive for chest pain and leg swelling.  All other systems reviewed and are negative.    Physical Exam Updated Vital Signs BP 103/71 (BP Location: Right Arm)   Pulse 88   Resp 22   SpO2 96%   Physical Exam  Constitutional: She is oriented to person, place, and time. She appears well-developed and well-nourished.  Morbidly obese  HENT:  Head: Normocephalic and atraumatic.  Mouth/Throat: Oropharynx is clear and  moist.  Eyes: Conjunctivae and EOM are normal. Pupils are equal, round, and reactive to light.  Neck: Normal range of motion.  Cardiovascular: Normal rate and normal heart sounds.  An irregularly irregular rhythm present.  No murmur heard. AFIB, rate controlled  Pulmonary/Chest: Effort normal and breath sounds normal. No respiratory distress. She has no wheezes.   No significant increased work of breathing, able to speak in sentences without difficulty, lungs sound somewhat wet  Abdominal: Soft. Bowel sounds are normal. There is no tenderness. There is no rebound.  Musculoskeletal: Normal range of motion.  3+ pitting edema of the lower extremities, no weeping, no calf tenderness or erythema  Neurological: She is alert and oriented to person, place, and time.  Skin: Skin is warm and dry.  Psychiatric: She has a normal mood and affect.  Nursing note and vitals reviewed.  E   ED Treatments / Results  Labs (all labs ordered are listed, but only abnormal results are displayed) Labs Reviewed  CBC WITH DIFFERENTIAL/PLATELET - Abnormal; Notable for the following:       Result Value   RBC 3.72 (*)    Hemoglobin 9.6 (*)    HCT 31.7 (*)    MCH 25.8 (*)    RDW 18.9 (*)    Platelets 122 (*)    All other components within normal limits  BASIC METABOLIC PANEL - Abnormal; Notable for the following:    BUN 29 (*)    All other components within normal limits  BRAIN NATRIURETIC PEPTIDE - Abnormal; Notable for the following:    B Natriuretic Peptide 387.4 (*)    All other components within normal limits  I-STAT TROPOININ, ED    EKG  EKG Interpretation  Date/Time:  Thursday October 12 2016 09:27:07 EST Ventricular Rate:  72 PR Interval:    QRS Duration: 94 QT Interval:  393 QTC Calculation: 431 R Axis:   58 Text Interpretation:  Atrial fibrillation Low voltage, precordial leads No significant change since last tracing Confirmed by YAO  MD, DAVID ([id number]) on 10/12/2016 9:35:02 AM        Radiology Dg Chest 2 View  Result Date: 10/12/2016 CLINICAL DATA:  Shortness of breath for 4 days. EXAM: CHEST  2 VIEW COMPARISON:  Single-view of the chest 09/27/2016 and 08/29/2016. FINDINGS: There is marked cardiomegaly with pulmonary vascular congestion. No consolidative process, pneumothorax or effusion. No acute bony abnormality. IMPRESSION: Cardiomegaly and pulmonary vascular congestion. Electronically Signed   By: Drusilla Kanner M.D.   On: 10/12/2016 10:26    Procedures Procedures (including critical care time)  Medications Ordered in ED Medications - No data to display   Initial Impression / Assessment and Plan / ED Course  I have reviewed the triage vital signs and the nursing notes.  Pertinent labs & imaging results  that were available during my care of the patient were reviewed by me and considered in my medical decision making (see chart for details).  61 year old female here with peripheral edema. Recent admission for new onset A. fib with CHF.  Was discharged on Lasix, but does not feel it is working very well.  She is afebrile, non-toxic.  O2 sats teetering between 93%-95% on RA.  Does not use O2 at home.  She does have 3+ edema of the legs, not asymmetric.  No calf tenderness, erythema, or other skin changes.  Lungs do sound wet.  Will plan for labs, CXR, EKG.  EKG AFIB, rate controlled.  Labs with again noted thrombocytopenia, actually improved from prior. Electrolytes are reassuring. Troponin is negative. BNP has actually increased to 387 today, compared with 297 previously.  CXR does show vascular congestion.  Patient's symptoms are concerning for recurrent CHF. She is anticoagulated with Eliquis so I feel PE is less likely. She remains without any pleuritic type chest pain. Given extra dose of IV Lasix here. Will discuss with family practice service.  Discussed with family practice service.  They have evaluated here in the ED.  Will admit for ongoing care.  They  request ABG which was ordered.  Final Clinical Impressions(s) / ED Diagnoses   Final diagnoses:  Congestive heart failure, unspecified congestive heart failure chronicity, unspecified congestive heart failure type (HCC)  Peripheral edema  Thrombocytopenia (HCC)  Anticoagulated  Atrial fibrillation, unspecified type [facility])    New Prescriptions New Prescriptions   No medications on file     Garlon Hatchet, Cordelia Poche 10/12/16 1412    Charlynne Pander, MD 10/13/16 1600

## 2016-10-13 DIAGNOSIS — I5033 Acute on chronic diastolic (congestive) heart failure: Secondary | ICD-10-CM

## 2016-10-13 LAB — BASIC METABOLIC PANEL
Anion gap: 8 (ref 5–15)
BUN: 29 mg/dL — ABNORMAL HIGH (ref 6–20)
CALCIUM: 8.8 mg/dL — AB (ref 8.9–10.3)
CO2: 29 mmol/L (ref 22–32)
Chloride: 104 mmol/L (ref 101–111)
Creatinine, Ser: 1.1 mg/dL — ABNORMAL HIGH (ref 0.44–1.00)
GFR calc Af Amer: >60 mL/min (ref >60)
GFR, EST NON AFRICAN AMERICAN: 52 mL/min — AB (ref >60)
GLUCOSE: 83 mg/dL (ref 65–99)
Potassium: 3.8 mmol/L (ref 3.5–5.1)
Sodium: 141 mmol/L (ref 135–145)

## 2016-10-13 LAB — CBC
HEMATOCRIT: 29.9 % — AB (ref 36.0–46.0)
Hemoglobin: 9.2 g/dL — ABNORMAL LOW (ref 12.0–15.0)
MCH: 25.9 pg — AB (ref 26.0–34.0)
MCHC: 30.8 g/dL (ref 30.0–36.0)
MCV: 84.2 fL (ref 78.0–100.0)
Platelets: 130 10*3/uL — ABNORMAL LOW (ref 150–400)
RBC: 3.55 MIL/uL — ABNORMAL LOW (ref 3.87–5.11)
RDW: 18.8 % — AB (ref 11.5–15.5)
WBC: 4.9 10*3/uL (ref 4.0–10.5)

## 2016-10-13 LAB — TROPONIN I

## 2016-10-13 MED ORDER — FUROSEMIDE 10 MG/ML IJ SOLN
40.0000 mg | Freq: Two times a day (BID) | INTRAMUSCULAR | Status: DC
  Administered 2016-10-13 – 2016-10-16 (×7): 40 mg via INTRAVENOUS
  Filled 2016-10-13 (×8): qty 4

## 2016-10-13 NOTE — Progress Notes (Signed)
Pt is alert and oriented was concerned about her potassium Called MD on call.   Will address in the Morning after Labs.

## 2016-10-13 NOTE — Evaluation (Signed)
Occupational Therapy Evaluation and Discharge Patient Details Name: Zoe Cobb MRN: [id number] DOB: [birth year] Today's Date: 10/13/2016    History of Present Illness Pt admitted with CHF exacerbation. PMH: recent dx of CHF, afib, morbid obesity, HTN   Clinical Impression   Pt is dependent at baseline in mobility and most ADL. No OT needs. Pt plans to return home with assist of her aide and sister.     Follow Up Recommendations  No OT follow up    Equipment Recommendations  None recommended by OT    Recommendations for Other Services       Precautions / Restrictions Precautions Precautions: Fall Restrictions Weight Bearing Restrictions: No      Mobility Bed Mobility                  Transfers                      Balance                                            ADL Overall ADL's : At baseline                                             Vision Baseline Vision/History: No visual deficits Patient Visual Report: No change from baseline       Perception     Praxis      Pertinent Vitals/Pain Pain Assessment: No/denies pain     Hand Dominance Right   Extremity/Trunk Assessment Upper Extremity Assessment Upper Extremity Assessment: Overall WFL for tasks assessed   Lower Extremity Assessment Lower Extremity Assessment: Defer to PT evaluation       Communication Communication Communication: No difficulties   Cognition Arousal/Alertness: Awake/alert Behavior During Therapy: WFL for tasks assessed/performed Overall Cognitive Status: Within Functional Limits for tasks assessed                     General Comments       Exercises       Shoulder Instructions      Home Living Family/patient expects to be discharged to:: Private residence Living Arrangements: Alone Available Help at Discharge: Personal care attendant;Family;Available PRN/intermittently Type of Home: House Home Access:  Ramped entrance     Home Layout: One level               Home Equipment: Wheelchair - power;Hospital bed (hoyer lift)   Additional Comments: doesn't shower, uses briefs for elimination      Prior Functioning/Environment Level of Independence: Needs assistance  Gait / Transfers Assistance Needed: hasn't ambulated in 2 years, up with hoyer lift, uses w/c ADL's / Homemaking Assistance Needed: Aide does meal prep, housekeeping and assist with ADL, pt can self feed and groom with set up,   Comments: pt has an aide 5 hours a day, 3 hours in morning, 2 hours in evening, sister lives nearby and checks on        OT Problem List: Decreased strength;Decreased activity tolerance;Obesity      OT Treatment/Interventions:      OT Goals(Current goals can be found in the care plan section) Acute Rehab OT Goals Patient Stated Goal: to get her legs stronger  OT Frequency:  Barriers to D/C:            Co-evaluation              End of Session    Activity Tolerance: Patient tolerated treatment well Patient left: in bed;with call bell/phone within reach  OT Visit Diagnosis:  (obesity)                ADL either performed or assessed with clinical judgement  Time: 1448-1856 OT Time Calculation (min): 20 min Charges:  OT General Charges $OT Visit: 1 Procedure OT Evaluation $OT Eval Moderate Complexity: 1 Procedure G-Codes:     Evern Bio 10/13/2016, 9:58 AM  [phone]

## 2016-10-13 NOTE — Clinical Social Work Note (Signed)
CSW acknowledges placement consult. PT and OT recommend no follow up. See evaluations for further details.   CSW signing off. Consult again if any other social work needs arise.  Charlynn Court, CSW [phone]

## 2016-10-13 NOTE — Progress Notes (Signed)
Pt does not want a flu shot.

## 2016-10-13 NOTE — Progress Notes (Signed)
Family Medicine Teaching Service Daily Progress Note Intern Pager: [phone]  Patient name: Zoe Cobb Medical record number: [id number] Date of birth: 1955-09-09 Age: 61 y.o. Gender: female  Primary Care Provider: Norm Salt, PA Consultants: None Code Status: FULL   Pt Overview and Major Events to Date:  Admit 3/8  Assessment and Plan:  #Dyspnea:   Likely with mixed CHF exacerbation/obesity hypoventilation syndrome. Remains significantly volume overloaded on exam with pitting edema on lower extremities and thigh.  Currently stable on room air.  - Weight on admission 467 lbs .  May be difficult to monitor daily weights given body habitus.   -Increase diuresis to  IV 40 mg Lasix BID as urine output only 500, consider at discharge Torsemide versus increased dose of Lasix.  -Troponins downtrending  0.15>0.03>0.03  - AM EKG with a fib, unchanged  -consider CPAP on discharge  -Strict I's and O's -Foley catheter, UOP 500 in past 24 hours.  Increase Lasix dose BID  -PT/OT eval  - Pulse oximetry  #HpEF: Echo in January 2018 with LVEF of 55-60%. G2DD. PA peak pressure 42 mmHg. Recent lipid panel and A1C in January - ASCVD risk is low 3.0% - Aspirin 80 mg  -Continue Lasix  #Wheezing: Remote hx of smoking more than 20 years ago making COPD less likely. PA pressure 42 mmHg, noting some pulmonary HTN mostly likely due to OHS, no history of sleep study.  - Duonebs every 6 hrs  - Will obtain ABG  - Consider starting on night time CPAP on discharge   #A-fib; EKG with afib. Has not seen cardiology yet . TSH slight elevated, T4 wnl.  - Continue Xarelto and metoprolol 12.5 BID   # ZHY:[id number] on Lasix alone . BP this AM 103/63.  - Monitor closely as she is diuresed  # Hep C, stable : on Mavyret at home. Per patient finished last dose of treatment last Friday.  - LFTs with normal AST, ALT  - CMP in am  #Thrombocytopenia (noted since January): Platelets of 122, history of  hepatitis C - Will check LFTs (results above) , PTT elev 24.3, INR 2.14 normal   # Morbid obesity: Patient is unable to move and requires a Hoyer lift to be placed in wheelchair. Per discussion with the ED provider sister was previously in the room and stated that she is worried about caring for patient in the future. She states it took 3 people to move patient to get to the hospital today.   -Updated daughter this morning on number provided.  She agrees with having PT/OT see her to assess needs.  She has plans to move back to [place], [place] and would likely need home health.    -PT/OT to see - Will consult social work   FEN/GI: None  Prophylaxis:Xarelto   Disposition: Continue to diurese.  Dispo pending clinical improvement.   Subjective:  Patient states her leg swelling has much improved since yesterday. Breathing a lot easier.  On RA .  No complaints at current time.   Objective: Temp:  [97.3 F (36.3 C)-97.9 F (36.6 C)] 97.8 F (36.6 C) (03/09 0536) Pulse Rate:  [85-104] 93 (03/09 1203) Resp:  [18] 18 (03/09 0536) BP: (91-104)/(0-70) 104/0 (03/09 1203) SpO2:  [93 %-99 %] 93 % (03/09 0536) Weight:  [464 lb (210.5 kg)-467 lb 6 oz (212 kg)] 464 lb (210.5 kg) (03/09 0536)   Physical Exam: General: Morbidly obese female, NAD, on RA Eyes: PERRLA, EOMI ENTM: MMM  Neck:  No lymphadenopathy, supple Cardiovascular:RRR, no MRG  Respiratory: Mild wheezing, no increased WOB Gastrointestinal: BS+, non-tender, distended MSK: 2+ bilateral edema, 2+ thigh edema  Derm: no rashes or wounds noted  Neuro: AAOx3, no focal deficits , strength and sensation intact  Psych: normal mood and affect   Laboratory:  Recent Labs Lab 10/12/16 0932 10/13/16 0416  WBC 4.9 4.9  HGB 9.6* 9.2*  HCT 31.7* 29.9*  PLT 122* 130*    Recent Labs Lab 10/12/16 0932 10/12/16 1332 10/13/16 0416  NA 142  --  141  K 3.9  --  3.8  CL 106  --  104  CO2 29  --  29  BUN 29*  --  29*   CREATININE 0.93  --  1.10*  CALCIUM 8.9  --  8.8*  PROT  --  8.5*  --   BILITOT  --  1.0  --   ALKPHOS  --  115  --   ALT  --  20  --   AST  --  34  --   GLUCOSE 95  --  83   Imaging/Diagnostic Tests: No results found.  Freddrick March, MD 10/13/2016, 12:19 PM PGY-1, [facility] Family Medicine FPTS Intern pager: [phone], text pages welcome

## 2016-10-13 NOTE — Evaluation (Signed)
Physical Therapy Evaluation Patient Details Name: Zoe Cobb MRN: [id number] DOB: [birth year] Today's Date: 10/13/2016   History of Present Illness  Pt admitted with CHF exacerbation. PMH: recent dx of CHF, afib, morbid obesity, HTN  Clinical Impression  Patient is dependent with mobility at baseline - Has aide that uses hoyer lift to move patient bed <> electric w/c in morning and evening.  Sister assists with her care.  Patient has been bed/wheelchair level for several years.  Has all equipment needed.  Patient plans to return home at d/c with assist of aide and sister.  No further acute PT needs identified - PT will sign off.    Follow Up Recommendations No PT follow up;Supervision - Intermittent    Equipment Recommendations  None recommended by PT    Recommendations for Other Services       Precautions / Restrictions Precautions Precautions: Fall Restrictions Weight Bearing Restrictions: No      Mobility  Bed Mobility Overal bed mobility: Needs Assistance Bed Mobility: Rolling Rolling: Max assist;+2 for physical assistance         General bed mobility comments: Patient required assist to bring UE to opposite rail to help with rolling to both sides.  Patient able to roll upper body 50% to side bil.  Required max assist for hips and LE's to both sides.  Patient uses hoyer lift to move to sitting at home.  Transfers                 General transfer comment: N/A - Hoyer lift used at home.  Ambulation/Gait                Stairs            Wheelchair Mobility    Modified Rankin (Stroke Patients Only)       Balance                                             Pertinent Vitals/Pain Pain Assessment: No/denies pain    Home Living Family/patient expects to be discharged to:: Private residence Living Arrangements: Alone Available Help at Discharge: Personal care attendant;Family;Available PRN/intermittently Type of Home:  House Home Access: Ramped entrance     Home Layout: One level Home Equipment: Wheelchair - power;Hospital bed [facility] lift) Additional Comments: Patient has aide 3 hours am and 2 hours pm.  Uses electric w/c during day.    Prior Function Level of Independence: Needs assistance   Gait / Transfers Assistance Needed: hasn't ambulated in 2 years, up with hoyer lift by aide, uses electric w/c  ADL's / Homemaking Assistance Needed: Aide does meal prep, housekeeping and assist with ADL, pt can self feed and groom with set up,  Comments: pt has an aide 5 hours a day, 3 hours in morning, 2 hours in evening, sister lives nearby and checks on     Hand Dominance   Dominant Hand: Right    Extremity/Trunk Assessment   Upper Extremity Assessment Upper Extremity Assessment: Generalized weakness (Decreased active ROM/strength bil shoulders)    Lower Extremity Assessment Lower Extremity Assessment: RLE deficits/detail;LLE deficits/detail RLE Deficits / Details: Strength grossly 2/5 LLE Deficits / Details: Strength grossly 2/5       Communication   Communication: No difficulties  Cognition Arousal/Alertness: Awake/alert Behavior During Therapy: WFL for tasks assessed/performed Overall Cognitive Status: Within Functional Limits for tasks assessed  General Comments      Exercises     Assessment/Plan    PT Assessment Patent does not need any further PT services  PT Problem List         PT Treatment Interventions      PT Goals (Current goals can be found in the Care Plan section)  Acute Rehab PT Goals PT Goal Formulation: All assessment and education complete, DC therapy    Frequency     Barriers to discharge        Co-evaluation               End of Session   Activity Tolerance: Patient tolerated treatment well;Patient limited by fatigue Patient left: in bed;with call bell/phone within reach Nurse Communication: Mobility status;Need  for lift equipment (Has aide who uses hoyer for transfers) PT Visit Diagnosis: Muscle weakness (generalized) (M62.81);Other abnormalities of gait and mobility (R26.89)         Time: 1440-1451 PT Time Calculation (min) (ACUTE ONLY): 11 min   Charges:   PT Evaluation $PT Eval Moderate Complexity: 1 Procedure     PT G Codes:         Vena Austria 10-19-2016, 3:05 PM Durenda Hurt. Renaldo Fiddler, [facility] Acute Rehab Services Pager [phone]

## 2016-10-14 DIAGNOSIS — I481 Persistent atrial fibrillation: Secondary | ICD-10-CM

## 2016-10-14 LAB — CBC
HCT: 30.4 % — ABNORMAL LOW (ref 36.0–46.0)
Hemoglobin: 9.3 g/dL — ABNORMAL LOW (ref 12.0–15.0)
MCH: 26 pg (ref 26.0–34.0)
MCHC: 30.6 g/dL (ref 30.0–36.0)
MCV: 84.9 fL (ref 78.0–100.0)
PLATELETS: 126 10*3/uL — AB (ref 150–400)
RBC: 3.58 MIL/uL — ABNORMAL LOW (ref 3.87–5.11)
RDW: 18.8 % — AB (ref 11.5–15.5)
WBC: 4.7 10*3/uL (ref 4.0–10.5)

## 2016-10-14 LAB — BASIC METABOLIC PANEL
ANION GAP: 6 (ref 5–15)
BUN: 27 mg/dL — ABNORMAL HIGH (ref 6–20)
CALCIUM: 8.8 mg/dL — AB (ref 8.9–10.3)
CO2: 31 mmol/L (ref 22–32)
Chloride: 105 mmol/L (ref 101–111)
Creatinine, Ser: 1.04 mg/dL — ABNORMAL HIGH (ref 0.44–1.00)
GFR, EST NON AFRICAN AMERICAN: 56 mL/min — AB (ref >60)
GLUCOSE: 90 mg/dL (ref 65–99)
POTASSIUM: 3.6 mmol/L (ref 3.5–5.1)
Sodium: 142 mmol/L (ref 135–145)

## 2016-10-14 NOTE — Progress Notes (Signed)
Pt uses only chucks pads at home for urine output- pt is not truly incontinent, but is unable to get from chair to commode at home. Pt has been trying to use the bedpan while hospitalized, but due to her size the urine does not appropriately drain to the pan, instead it leaks around and only soaks the chucks pads after all.   Pt is on 'aggressive' diuretic therapy, and may benefit from a foley catheter to prevent further skin breakdown.

## 2016-10-14 NOTE — Progress Notes (Signed)
Patient's BP is 91/57 when attempting to administer IV lasix, pt is uncomfortable with receiving the medication at this time.   Message will be passed to night shift, in hopes a recheck of BP will create a better scenario.   Pt is also scheduled to receive a second dose of metoprolol today.

## 2016-10-14 NOTE — Progress Notes (Signed)
Family Medicine Teaching Service Daily Progress Note Intern Pager: [phone]  Patient name: Zoe Cobb Medical record number: [id number] Date of birth: 05-01-1956 Age: 61 y.o. Gender: female  Primary Care Provider: Norm Salt, PA Consultants: None Code Status: FULL   Pt Overview and Major Events to Date:  Admit 3/8   Assessment and Plan: #Dyspnea:   Likely with mixed CHF exacerbation/obesity hypoventilation syndrome.   Remains significantly volume overloaded on exam with 1+ pitting edema on lower extremities and thigh.  Currently stable on room air with 02 sats 93-95%. Unlikely ACS as EKG with a-fib and unchanged from prior. Troponins have remained negative at 0.15>0.03x2. Do not need to continue trending.   - Weight on admission 467 lbs >>455 lbs.  Upon chart review, dry weight is unknown but Jan 2018 was 480 lbs and unclear if she was fluid overloaded at that time.   -After increasing diuresis to  IV 40 mg Lasix BID yesterday (3/9) UOP remains 500.  Reports LE edema and breathing has improved and patient's weight reflects down 12 lbs.  Hypotensive overnight to 90s/50s but asymptomatic.  Given that we would like to diurese her further however hypotensive and UOP has not improved on higher dose Lasix, will consult cardiology this AM for recs.  -consider at discharge Torsemide versus increased dose of Lasix (home Lasix dose 20 mg BID).   -consider CPAP on discharge  -Strict I's and O's - Pulse oximetry  #HpEF: Echo in January 2018 with LVEF of 55-60%. G2DD. PA peak pressure 42 mmHg. Recent lipid panel and A1C in January - ASCVD risk is low 3.0%  - Aspirin 80 mg  -Continue Lasix   #Wheezing, improved: Remote hx of smoking more than 20 years ago making COPD less likely. PA pressure 42 mmHg, noting some pulmonary HTN mostly likely due to OHS, no history of sleep study.  Will consider CPAP on d/c.  Still with some mild expiratory wheezing on physical exam.  - Duonebs Q6 prn    #A-fib; EKG with afib. Has not seen cardiology yet . TSH slight elevated, T4 wnl.  - Continue Xarelto and metoprolol 12.5 BID   # HTN: Patient hypotensive overnight to 90s/50s.  BP this AM 98/59.  Likely due to increasing Lasix to BID yesterday. Will consult cardiology regarding this.   -Monitor Cr . 1.04 this AM.   # Hep C, stable : on Mavyret at home. Per patient finished last dose of treatment last Friday.  - LFTs with normal AST, ALT  - CMP in am  #Thrombocytopenia (noted since January): Platelets of 122, history of hepatitis C - LFTs wnl, PTT elev 24.3, INR 2.14 normal   # Morbid obesity: PT/OT seen and no follow up recommended.  Patient is unable to move and requires a Hoyer lift to be placed in wheelchair.  She states it took 3 people to move patient to get to the hospital today.   - She has plans to move back to [place], [place].  - Will consult social work   FEN/GI: None  Prophylaxis:Xarelto   Disposition: .  Dispo pending clinical improvement.    Subjective:  Patient states her leg swelling and breathing has improved.  She is on RA .  Concerned about her potassium but was reassured that her electrolytes were wnl and we will replete them if necessary.    Objective: Temp:  [97.6 F (36.4 C)-98.9 F (37.2 C)] 98.5 F (36.9 C) (03/10 0657) Pulse Rate:  [83-97] 97 (03/10 0657)  Resp:  [18] 18 (03/10 0657) BP: (94-104)/(0-60) 99/60 (03/10 0949) SpO2:  [93 %-98 %] 93 % (03/10 0657) Weight:  [455 lb (206.4 kg)] 455 lb (206.4 kg) (03/10 1683)   Physical Exam: General: Morbidly obese female, NAD Eyes: PERRLA, EOMI  ENTM: MMM  Neck: Supple Cardiovascular:RRR, no MRG  Respiratory: Mild expiratory wheezing, no crackles noted, comfortable work of breathing  Gastrointestinal: BS+, non-tender, distended MSK: 1+ bilateral edema, 1+ thigh edema . ROM limited by body habitus.  Derm: no rashes or wounds noted  Neuro: AAOx3, no focal deficits  Psych: normal mood  and affect   Wt Readings from Last 10 Encounters:  10/14/16 (!) 455 lb (206.4 kg)  09/30/16 (!) 464 lb (210.5 kg)  08/30/16 (!) 480 lb (217.7 kg)   Laboratory:  Recent Labs Lab 10/12/16 0932 10/13/16 0416 10/14/16 0758  WBC 4.9 4.9 4.7  HGB 9.6* 9.2* 9.3*  HCT 31.7* 29.9* 30.4*  PLT 122* 130* 126*    Recent Labs Lab 10/12/16 0932 10/12/16 1332 10/13/16 0416 10/14/16 0758  NA 142  --  141 142  K 3.9  --  3.8 3.6  CL 106  --  104 105  CO2 29  --  29 31  BUN 29*  --  29* 27*  CREATININE 0.93  --  1.10* 1.04*  CALCIUM 8.9  --  8.8* 8.8*  PROT  --  8.5*  --   --   BILITOT  --  1.0  --   --   ALKPHOS  --  115  --   --   ALT  --  20  --   --   AST  --  34  --   --   GLUCOSE 95  --  83 90   Imaging/Diagnostic Tests: No results found.  Freddrick March, MD 10/14/2016, 10:56 AM PGY-1, [facility] FPTS Intern pager: [phone], text pages welcome

## 2016-10-14 NOTE — Progress Notes (Signed)
Per Family Medicine, pt is ok to receive Metoprolol and Lasix today. Clarification provided to pt, regarding no increase in dose; but to continue with current dose.  Will proceed with previously held morning doses.

## 2016-10-14 NOTE — Progress Notes (Signed)
Lasix held, per pt report that MD at bedside this morning reported plan to hold, and pts low Bp. Metoprolol held as well.

## 2016-10-14 NOTE — Consult Note (Signed)
Patient ID: Zoe Cobb MRN: [id number], DOB/AGE: 1955/08/19   Admit date: 10/12/2016   Reason for Consult: CHF Requesting MD: Internal Medicine    Primary Physician: Norm Salt, PA Primary Cardiologist: Dr. Eden Emms   Pt. 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 This is her 3rd admission for CHF in the last 2 months.   Problem List  Past Medical History:  Diagnosis Date  . CHF (congestive heart failure) (HCC)   . Hypertension   . Morbid obesity (HCC)     History reviewed. No pertinent surgical history.   Allergies  Allergies  Allergen Reactions  . Penicillins Swelling    Has patient had a PCN reaction causing immediate rash, facial/tongue/throat swelling, SOB or lightheadedness with hypotension: Yes Has patient had a PCN reaction causing severe rash involving mucus membranes or skin necrosis: No Has patient had a PCN reaction that required hospitalization No Has patient had a PCN reaction occurring within the last 10 years: No If all of the above answers are "NO", then may proceed with Cephalosporin use.     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 She was recently seen by Dr. Eden Emms for consultation on 08/28/16 for acute CHF after presenting to the hospital with a complaint of exertional dyspnea, orthopnea and increased edema. Per consult note, her BNP during that admission was in the 200s but felt to be possibly falsely low due her obesity. 2D echo showed normal LVEF at 55-60% with grade 2DD. She was started on 40 mg BID of PO lasix.  It was outline that no further cardiac w/u would be considered for this pt who is morbidly obese and non ambulatory. She was discharged on 08/30/16.  She  presented back to [facility] 09/27/16 with complaint of recurrent shortness of breath, worsening LEE and palpitations. She was found to be in new onset atrial fibrillation.  She was seen by Dr. Elease Hashimoto. He started her on Xarelto and recommended possible DCCV after 1 month of a/c. She was felt too high risk for TEE. She was also treated with IV lasix and discharged home the following day on 09/28/16 with 12.5 mg of metoprolol and 20 mg of Xarelto. Lasix was adjusted. She was instructed to take 20 mg of Lasix daily, based on d/c summary.   She has been readmitted once again for dyspnea and lower extremity edema. BNP 387 (higher now than prior admits at 297 and 220). She has been admitted for acute CHF exacerbation. She reports she has been fully compliant with Lasix at home but notes reduced UOP. She has been fully compliant with Xarelto. She is in afib with a CVR in the 90s.   Home Medications  Prior to Admission medications   Medication Sig Start Date End Date Taking? Authorizing Provider  aspirin EC 81 MG EC tablet Take 1 tablet (81 mg total) by mouth daily. 08/31/16  Yes Almon Hercules, MD  cholecalciferol (VITAMIN D) 1000 units tablet Take 1,000 Units by mouth daily.   Yes Historical Provider, MD  furosemide (LASIX) 40 MG tablet Take 0.5 tablets (20 mg total) by mouth daily. 09/30/16  Yes Renne Musca, MD  MAVYRET 100-40 MG TABS  Take 3 tablets by mouth daily.  08/16/16  Yes Historical Provider, MD  metoprolol tartrate (LOPRESSOR) 25 MG tablet Take 0.5 tablets (12.5 mg total) by mouth 2 (two) times daily. 09/30/16  Yes Renne Musca, MD  polyethylene glycol [facility] / GLYCOLAX) packet Take 17 g by mouth daily as needed for mild constipation. 08/30/16  Yes Almon Hercules, MD  rivaroxaban (XARELTO) 20 MG TABS tablet Take 1 tablet (20 mg total) by mouth daily. 10/01/16  Yes Renne Musca, MD   Scheduled Meds: . aspirin EC  81 mg Oral Daily  . cholecalciferol  1,000 Units Oral Daily  . furosemide  40 mg  Intravenous BID  . ipratropium-albuterol  3 mL Nebulization Once  . metoprolol tartrate  12.5 mg Oral BID  . rivaroxaban  20 mg Oral Daily  . sodium chloride flush  3 mL Intravenous Q12H   Continuous Infusions: PRN Meds:.acetaminophen **OR** acetaminophen, ipratropium-albuterol, polyethylene glycol  Family History  Family History  Problem Relation Age of Onset  . Hypertension Mother   . Diabetes Mother     Social History  Social History   Social History  . Marital status: Single    Spouse name: N/A  . Number of children: N/A  . Years of education: N/A   Occupational History  . Not on file.   Social History Main Topics  . Smoking status: Former Smoker    Types: Cigarettes  . Smokeless tobacco: Never Used  . Alcohol use No     Comment: once in a while  . Drug use: No  . Sexual activity: Not on file   Other Topics Concern  . Not on file   Social History Narrative   Pt lives by herself, she completed hs.      Review of Systems General:  No chills, fever, night sweats or weight changes.  Cardiovascular:  No chest pain, dyspnea on exertion, edema, orthopnea, palpitations, paroxysmal nocturnal dyspnea. Dermatological: No rash, lesions/masses Respiratory: No cough, dyspnea Urologic: No hematuria, dysuria Abdominal:   No nausea, vomiting, diarrhea, bright red blood per rectum, melena, or hematemesis Neurologic:  No visual changes, wkns, changes in mental status. All other systems reviewed and are otherwise negative except as noted above.  Physical Exam  Blood pressure 103/67, pulse 89, temperature 98.7 F (37.1 C), temperature source Oral, resp. rate 18, height 5\' 1"  (1.549 m), weight (!) 455 lb (206.4 kg), SpO2 95 %.  General: Pleasant, NAD, morbidly obese  Psych: Normal affect. Neuro: Alert and oriented X 3. Moves all extremities spontaneously. HEENT: Normal  Neck: difficult to assess JVD given body habitus  Lungs:  Resp regular and unlabored, CTA. Heart:  irregularly irregular, regular rate no s3, s4, or murmurs. Abdomen: obese Soft, non-tender, non-distended, BS + x 4.  Extremities:  Obese No clubbing, or cyanosis.  DP/PT/Radials 2+ and equal bilaterally.  Labs  Troponin [facility] Test)  Recent Labs  10/12/16 0950  TROPIPOC 0.00    Recent Labs  10/12/16 1638 10/12/16 2151 10/13/16 0416  TROPONINI 0.15* <0.03 <0.03   Lab Results  Component Value Date   WBC 4.7 10/14/2016   HGB 9.3 (L) 10/14/2016   HCT 30.4 (L) 10/14/2016   MCV 84.9 10/14/2016   PLT 126 (L) 10/14/2016    Recent Labs Lab 10/12/16 1332  10/14/16 0758  NA  --   < > 142  K  --   < > 3.6  CL  --   < > 105  CO2  --   < >  31  BUN  --   < > 27*  CREATININE  --   < > 1.04*  CALCIUM  --   < > 8.8*  PROT 8.5*  --   --   BILITOT 1.0  --   --   ALKPHOS 115  --   --   ALT 20  --   --   AST 34  --   --   GLUCOSE  --   < > 90  < > = values in this interval not displayed. Lab Results  Component Value Date   CHOL 139 08/28/2016   HDL 58 08/28/2016   LDLCALC 70 08/28/2016   TRIG 53 08/28/2016   Lab Results  Component Value Date   DDIMER 2.07 (H) 09/27/2016     Radiology/Studies  Dg Chest 2 View  Result Date: 10/12/2016 CLINICAL DATA:  Shortness of breath for 4 days. EXAM: CHEST  2 VIEW COMPARISON:  Single-view of the chest 09/27/2016 and 08/29/2016. FINDINGS: There is marked cardiomegaly with pulmonary vascular congestion. No consolidative process, pneumothorax or effusion. No acute bony abnormality. IMPRESSION: Cardiomegaly and pulmonary vascular congestion. Electronically Signed   By: Drusilla Kanner M.D.   On: 10/12/2016 10:26   Dg Chest Port 1 View  Result Date: 09/27/2016 CLINICAL DATA:  Shortness of breath. EXAM: PORTABLE CHEST 1 VIEW COMPARISON:  August 29, 2016 FINDINGS: There is interstitial edema. There is cardiomegaly with pulmonary venous hypertension. No airspace consolidation. No adenopathy. There is degenerative change in each  shoulder. IMPRESSION: Findings indicative of congestive heart failure. No airspace consolidation. Electronically Signed   By: Bretta Bang III M.D.   On: 09/27/2016 09:17    ECG  Atrial fibrillation in the 90s. -personally reviewed  Echocardiogram 08/30/15 (previous admission)  Study Conclusions  - Left ventricle: The cavity size was mildly dilated. Wall   thickness was increased in a pattern of mild LVH. Systolic   function was normal. The estimated ejection fraction was in the   range of 55% to 60%. Wall motion was normal; there were no   regional wall motion abnormalities. Features are consistent with   a pseudonormal left ventricular filling pattern, with concomitant   abnormal relaxation and increased filling pressure (grade 2   diastolic dysfunction). - Mitral valve: Calcified annulus. - Left atrium: The atrium was moderately dilated. - Right atrium: The atrium was mildly dilated. - Pulmonary arteries: Systolic pressure was mildly increased. PA   peak pressure: 42 mm Hg (S).  Impressions:  - Technically difficult; definity used; normal LV systolic   function; grade 2 diastolic dysfunction; moderate LAE; mild RAE;   mild TR with mildly elevated pulmonary pressure.  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 This is her 3rd admission for CHF in the last 2 months.    1. Acute on Chronic Diastolic CHF: assessment of volume status is difficult given body habitus. BNP is 387, but this may be falsely low given her morbid obesity. She had an echo previous admission 08/2016 that showed normal LVEF 55-60% with grade 2DD and was placed on PO Lasix. She reports compliance at home but notes reduced UOP. This is her 3rd admission for CHF in the last 2 months. Continue IV lasix. Consider  changing PO diuretic from Lasix to Torsemide for better  gut absorption.   2. Atrial Fibrillation: Diagnosed previous admission, 09/28/16. Rate is controlled in the 90s with low dose metoprolol. K stable.  Her CHA2DS2 VASc score is at least 3 for CHF, HTN and female sex.  She was started on Xarelto 09/28/16 for a/c with plans to consider DCCV after 1 month of anticoagulation. We can consider this in 2 more weeks if still in afib. Recent echo however showed moderately dilated LA.   MD to assess and will give further recommendations.   Signed, Robbie Lis, PA-C 10/14/2016, 1:52 PM    Attending note:  Patient seen and examined. Reviewed chart and discussed with Ms. Google. Ms. Margo Aye presents with recurrent, acute on chronic diastolic heart failure associated with relatively reduced urine output on stable outpatient diuretic regimen. She has recently diagnosed persistent atrial fibrillation and is on Xarelto for stroke prophylaxis with adequate heart rate control on beta blocker. On examination blood pressure has been in the 90 to100 range, heart rate in the 80s to 90s. Lungs with decreased breath sounds but no wheezing, difficult to assess JVP. Cardiac exam reveals an irregularly irregular rhythm consistent with atrial fibrillation. Echocardiogram from January revealed LVEF 55-60% with moderate diastolic dysfunction. Current medical regimen includes Lopressor 12.5 mg twice daily, Xarelto, and IV Lasix 40 mg twice daily. Hold off on ACE inhibitor or ARB in light of recent blood pressures. Urine output does not appear to be accurately recorded as yet, this should be tracked closely to assess diuretic effect. Renal function is stable with creatinine 1.0. May want to consider conversion to oral Demadex ultimately for discharge. Would still plan to pursue elective cardioversion of her atrial fibrillation as was already anticipated as this may also help improve her diastolic heart failure  symptoms.  Jonelle Sidle, M.D., F.A.C.C.

## 2016-10-15 DIAGNOSIS — I5033 Acute on chronic diastolic (congestive) heart failure: Secondary | ICD-10-CM

## 2016-10-15 LAB — BASIC METABOLIC PANEL
Anion gap: 6 (ref 5–15)
BUN: 24 mg/dL — AB (ref 6–20)
CO2: 30 mmol/L (ref 22–32)
CREATININE: 1.08 mg/dL — AB (ref 0.44–1.00)
Calcium: 8.7 mg/dL — ABNORMAL LOW (ref 8.9–10.3)
Chloride: 104 mmol/L (ref 101–111)
GFR calc Af Amer: >60 mL/min (ref >60)
GFR, EST NON AFRICAN AMERICAN: 53 mL/min — AB (ref >60)
Glucose, Bld: 86 mg/dL (ref 65–99)
POTASSIUM: 3.7 mmol/L (ref 3.5–5.1)
Sodium: 140 mmol/L (ref 135–145)

## 2016-10-15 NOTE — Progress Notes (Addendum)
Page to intern pager to request inter-dry order for patient's MASD under her breasts.    Verbal order received from K.Mayo,MD, product requested from supply

## 2016-10-15 NOTE — Progress Notes (Signed)
Pt. Is alert and oriented in bed with no distress, room air in alternating bariatric bed,legg elevated for pain and swelling, Foley care done. Vitals and labs stable.

## 2016-10-15 NOTE — Progress Notes (Signed)
Family Medicine Teaching Service Daily Progress Note Intern Pager: [phone]  Patient name: Zoe Cobb Medical record number: [id number] Date of birth: 19-Nov-1955 Age: 61 y.o. Gender: female  Primary Care Provider: Norm Salt, PA Consultants: None Code Status: FULL   Pt Overview and Major Events to Date:  Admit 3/8   Assessment and Plan: #Dyspnea:   Patient significantly volume overloaded on exam with 1+ pitting edema on lower extremities and thigh.  O2 sats have been stable overnight on RA.   - Weight on admission 467 lbs >>455>>451.  Upon chart review, dry weight is unknown but Jan 2018 was 480 lbs and unclear if she was fluid overloaded at that time.   -After increasing diuresis to  IV 40 mg Lasix BID on 3/9, UOP remained 500.  Reports LE edema and breathing has improved.   Hypotensive overnight to 90s/50s but asymptomatic. PM Lasix dose held given BP 91/57 and blood pressures overnight have remained stable at 100s/60s.  Renal function at baseline with creatinine 1.0. -Cardiology had been consulted, appreciate recs:    Consider Torsemide for better gut absorption.  Hold off on ACE inhibitor or ARB in light of recent blood pressures. UOP may not be accurately recorded, will track closely to assess diuretic effect.  Discussed with patient this AM and she is agreeable to a foley catheter.  Per cards - Consider conversion to oral Demadex ultimately for discharge. Would still plan to pursue elective cardioversion of her afib as this may also help improve her diastolic heart failure symptoms.  -Will continue diuresis and be wary of blood pressures.  Can  hold Lopressor if hypotensive.  -consider at discharge Torsemide versus increased dose of Lasix (home Lasix dose 20 mg BID).   -consider CPAP on discharge  -Strict I's and O's - Pulse oximetry  #HpEF: Echo in January 2018 with LVEF of 55-60%. G2DD. PA peak pressure 42 mmHg. Recent lipid panel and A1C in January - ASCVD risk is low 3.0%   - Aspirin 80 mg  -Continue Lasix   #Wheezing, improved: Remote hx of smoking more than 20 years ago making COPD less likely. PA pressure 42 mmHg, noting some pulmonary HTN mostly likely due to OHS, no history of sleep study.  Will consider CPAP on d/c.  Still with some mild expiratory wheezing on physical exam.  - Duonebs Q6 prn   #A-fib; EKG with afib. Has not seen cardiology yet . TSH slight elevated, T4 wnl.  - Continue Xarelto and metoprolol 12.5 BID  -Per cards recs, elective cardioversion of a-fib may help improve diastolic HF symptoms   # HTN: Patient hypotensive overnight to 100s/50s.  BP this AM 100/55.    -Monitor Cr   # Hep C, stable : on Mavyret at home. Per patient finished last dose of treatment last Friday.  - LFTs with normal AST, ALT  - CMP in am  #Thrombocytopenia (noted since January): Platelets of 122, history of hepatitis C - LFTs wnl, PTT elev 24.3, INR 2.14 normal   # Morbid obesity: PT/OT seen and no follow up recommended.  Patient is unable to move and requires a Hoyer lift to be placed in wheelchair.  She states it took 3 people to move patient to get to the hospital today.   - She has plans to move back to [place], [place].  - Will consult social work   FEN/GI: None  Prophylaxis:Xarelto   Disposition: .  Dispo pending clinical improvement.    Subjective:  Patient  states her leg swelling and breathing has improved.  She is on RA .    Objective: Temp:  [97 F (36.1 C)-98.7 F (37.1 C)] 97.1 F (36.2 C) (03/11 0300) Pulse Rate:  [86-96] 86 (03/11 0900) Resp:  [18] 18 (03/11 0300) BP: (91-106)/(55-67) 102/61 (03/11 0900) SpO2:  [95 %-97 %] 97 % (03/11 0300) Weight:  [451 lb (204.6 kg)] 451 lb (204.6 kg) (03/11 0300)   Physical Exam: General: Morbidly obese 61 yo female, in NAD  Eyes: PERRLA, EOMI  ENTM: MMM  Neck: Supple Cardiovascular:RRR, no MRG  Respiratory: Mild expiratory wheezing, no crackles noted, comfortable work of  breathing on RA  Gastrointestinal: BS+, non-tender, distended MSK: 1+ bilateral edema, 1+ thigh edema . ROM limited by body habitus. , SCDs in place  Derm: no rashes or wounds noted  Neuro: AAOx3, no focal deficits  Psych: normal mood and affect   Wt Readings from Last 10 Encounters:  10/15/16 (!) 451 lb (204.6 kg)  09/30/16 (!) 464 lb (210.5 kg)  08/30/16 (!) 480 lb (217.7 kg)   Laboratory:  Recent Labs Lab 10/12/16 0932 10/13/16 0416 10/14/16 0758  WBC 4.9 4.9 4.7  HGB 9.6* 9.2* 9.3*  HCT 31.7* 29.9* 30.4*  PLT 122* 130* 126*    Recent Labs Lab 10/12/16 1332 10/13/16 0416 10/14/16 0758 10/15/16 0557  NA  --  141 142 140  K  --  3.8 3.6 3.7  CL  --  104 105 104  CO2  --  29 31 30   BUN  --  29* 27* 24*  CREATININE  --  1.10* 1.04* 1.08*  CALCIUM  --  8.8* 8.8* 8.7*  PROT 8.5*  --   --   --   BILITOT 1.0  --   --   --   ALKPHOS 115  --   --   --   ALT 20  --   --   --   AST 34  --   --   --   GLUCOSE  --  83 90 86   Imaging/Diagnostic Tests: No results found.  Freddrick March, MD 10/15/2016, 10:46 AM PGY-1, [facility] FPTS Intern pager: [phone], text pages welcome

## 2016-10-15 NOTE — Progress Notes (Signed)
Patient woke during report, requested to sleep longer.

## 2016-10-15 NOTE — Progress Notes (Signed)
Progress Note  Patient Name: Zoe Cobb Date of Encounter: 10/15/2016  Primary Cardiologist: Dr. Charlton Haws  Subjective   No chest pain or breathlessness at rest.  Inpatient Medications    Scheduled Meds: . aspirin EC  81 mg Oral Daily  . cholecalciferol  1,000 Units Oral Daily  . furosemide  40 mg Intravenous BID  . ipratropium-albuterol  3 mL Nebulization Once  . metoprolol tartrate  12.5 mg Oral BID  . rivaroxaban  20 mg Oral Daily  . sodium chloride flush  3 mL Intravenous Q12H    PRN Meds: acetaminophen **OR** acetaminophen, ipratropium-albuterol, polyethylene glycol   Vital Signs    Vitals:   10/14/16 2023 10/15/16 0015 10/15/16 0300 10/15/16 0900  BP: 106/64 105/60 (!) 100/55 102/61  Pulse: 96 94 92 86  Resp: 18 18 18    Temp: 97 F (36.1 C) 97 F (36.1 C) 97.1 F (36.2 C)   TempSrc: Oral Oral Oral   SpO2: 95% 96% 97%   Weight:   (!) 451 lb (204.6 kg)   Height:        Intake/Output Summary (Last 24 hours) at 10/15/16 1131 Last data filed at 10/15/16 1000  Gross per 24 hour  Intake              393 ml  Output              200 ml  Net              193 ml   Filed Weights   10/13/16 0536 10/14/16 0639 10/15/16 0300  Weight: (!) 464 lb (210.5 kg) (!) 455 lb (206.4 kg) (!) 451 lb (204.6 kg)    Telemetry    Atrial fibrillation. Personally reviewed.  ECG    Tracing from 10/12/2008 shows rate-controlled atrial fibrillation with low voltage and nonspecific T-wave changes. Personally reviewed.  Physical Exam   GEN: Morbidly obese, no acute distress.   Neck: Cannot assess JVD. Cardiac: Irregularly irregular, no murmur, rub, or gallop.  Respiratory: Nonlabored. Decreased breath sounds. GI: Soft, nontender, bowel sounds present. MS: Chronic appearing edema; No deformity.  Labs    Chemistry Recent Labs Lab 10/12/16 1332 10/13/16 0416 10/14/16 0758 10/15/16 0557  NA  --  141 142 140  K  --  3.8 3.6 3.7  CL  --  104 105 104  CO2  --  29 31  30   GLUCOSE  --  83 90 86  BUN  --  29* 27* 24*  CREATININE  --  1.10* 1.04* 1.08*  CALCIUM  --  8.8* 8.8* 8.7*  PROT 8.5*  --   --   --   ALBUMIN 2.9*  --   --   --   AST 34  --   --   --   ALT 20  --   --   --   ALKPHOS 115  --   --   --   BILITOT 1.0  --   --   --   GFRNONAA  --  52* 56* 53*  GFRAA  --  >60 >60 >60  ANIONGAP  --  8 6 6      Hematology Recent Labs Lab 10/12/16 0932 10/13/16 0416 10/14/16 0758  WBC 4.9 4.9 4.7  RBC 3.72* 3.55* 3.58*  HGB 9.6* 9.2* 9.3*  HCT 31.7* 29.9* 30.4*  MCV 85.2 84.2 84.9  MCH 25.8* 25.9* 26.0  MCHC 30.3 30.8 30.6  RDW 18.9* 18.8* 18.8*  PLT 122* 130*  126*    Cardiac Enzymes Recent Labs Lab 10/12/16 1638 10/12/16 2151 10/13/16 0416  TROPONINI 0.15* <0.03 <0.03    Recent Labs Lab 10/12/16 0950  TROPIPOC 0.00     BNP Recent Labs Lab 10/12/16 0932  BNP 387.4*     Radiology    Chest x-ray 10/12/2016: FINDINGS: There is marked cardiomegaly with pulmonary vascular congestion. No consolidative process, pneumothorax or effusion. No acute bony abnormality.  IMPRESSION: Cardiomegaly and pulmonary vascular congestion.  Cardiac Studies   Echocardiogram 08/29/2016: Study Conclusions  - Left ventricle: The cavity size was mildly dilated. Wall   thickness was increased in a pattern of mild LVH. Systolic   function was normal. The estimated ejection fraction was in the   range of 55% to 60%. Wall motion was normal; there were no   regional wall motion abnormalities. Features are consistent with   a pseudonormal left ventricular filling pattern, with concomitant   abnormal relaxation and increased filling pressure (grade 2   diastolic dysfunction). - Mitral valve: Calcified annulus. - Left atrium: The atrium was moderately dilated. - Right atrium: The atrium was mildly dilated. - Pulmonary arteries: Systolic pressure was mildly increased. PA   peak pressure: 42 mm Hg (S).  Impressions:  - Technically  difficult; definity used; normal LV systolic   function; grade 2 diastolic dysfunction; moderate LAE; mild RAE;   mild TR with mildly elevated pulmonary pressure.  Patient Profile     61 y.o. 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 This is her 3rd admission for CHF in the last 2 months.   Assessment & Plan    1. Acute on chronic diastolic heart failure. LVEF 55-60%. Intake and output are not accurate but weight is coming down and she does report some improvement. Approximately 10 pound weight loss since admission. Currently on Lasix 40 mg IV twice daily.  2. Persistent atrial fibrillation. Rate controlled on Lopressor and stroke prophylaxis with Xarelto. Being considered for elective cardioversion after one month anticoagulation (Xarelto instituted on February 22).  3. Essential hypertension.  Continue current diuretic regimen, Lopressor, and Xarelto.  Signed, Nona Dell, MD  10/15/2016, 11:31 AM

## 2016-10-16 LAB — BASIC METABOLIC PANEL
ANION GAP: 7 (ref 5–15)
BUN: 24 mg/dL — ABNORMAL HIGH (ref 6–20)
CO2: 33 mmol/L — ABNORMAL HIGH (ref 22–32)
Calcium: 8.7 mg/dL — ABNORMAL LOW (ref 8.9–10.3)
Chloride: 100 mmol/L — ABNORMAL LOW (ref 101–111)
Creatinine, Ser: 1.05 mg/dL — ABNORMAL HIGH (ref 0.44–1.00)
GFR calc Af Amer: >60 mL/min (ref >60)
GFR, EST NON AFRICAN AMERICAN: 55 mL/min — AB (ref >60)
Glucose, Bld: 88 mg/dL (ref 65–99)
POTASSIUM: 3.6 mmol/L (ref 3.5–5.1)
Sodium: 140 mmol/L (ref 135–145)

## 2016-10-16 NOTE — Progress Notes (Signed)
Family Medicine Teaching Service Daily Progress Note Intern Pager: [phone]  Patient name: Zoe Cobb Medical record number: [id number] Date of birth: 1956/05/05 Age: 61 y.o. Gender: female  Primary Care Provider: Norm Salt, PA Consultants: None Code Status: FULL   Pt Overview and Major Events to Date:  Admit 3/8   Assessment and Plan: #Dyspnea:   Patient significantly volume overloaded on exam with 1+ pitting edema on lower extremities and thigh.  O2 sats have been stable overnight on RA.   - Weight on admission 467 lbs >>455>>451>>436.  Upon chart review, dry weight is unknown but Jan 2018 was 480 lbs and unclear if she was fluid overloaded at that time.  Weight -31 lbs since admission and is diuresing well on this dose.  -Cardiology recs:  UOP may not be accurately recorded previously given her body habitus, will track closely to assess diuretic effect.  Discussed with patient yesterday and she was agreeable to a foley catheter.  After inserting Foley on 3/11 and continuing IV 40 mg Lasix BID, UOP more accurately measured and -2.3L in 24 hours.   -Tomorrow will transition to 60 mg po Torsemide daily for better gut absorption .  Her home Lasix dose is 20 mg BID.  Would still plan to pursue elective cardioversion of her afib as this may also help improve her diastolic heart failure symptoms.   -Breathing and LE edema improved. Hypotensive overnight to 98/74, asymptomatic. - Continue cautious diuresis with her hypotension but can hold Lopressor dose if necessary.  Renal function at baseline with Cr 1.05.  -consider CPAP on discharge   -Strict I's and O's - Pulse oximetry  #HpEF: Echo in January 2018 with LVEF of 55-60%. G2DD. PA peak pressure 42 mmHg. Recent lipid panel and A1C in January - ASCVD risk is low 3.0%  - Aspirin 80 mg  -Continue Lasix   #Wheezing, improved: Remote hx of smoking more than 20 years ago making COPD less likely. PA pressure 42 mmHg, noting some  pulmonary HTN mostly likely due to OHS, no history of sleep study.  Will consider CPAP on d/c.  Still with some mild expiratory wheezing on physical exam.  - Duonebs Q6 prn   #A-fib; EKG with afib. Has not seen cardiology yet . TSH slight elevated, T4 wnl.  - Continue Xarelto and metoprolol 12.5 BID  -Per cards recs, elective cardioversion of a-fib may help improve diastolic HF symptoms   # HTN: Patient hypotensive overnight to 100s/50s.  BP this AM 100/73.    -Monitor Cr   # Hep C, stable : on Mavyret at home. Per patient finished last dose of treatment last Friday.  - LFTs with normal AST, ALT  - CMP in am  #Thrombocytopenia (noted since January): Platelets of 122, history of hepatitis C - LFTs wnl, PTT elev 24.3, INR 2.14 normal   # Morbid obesity: PT/OT seen and no follow up recommended.  Patient is unable to move and requires a Hoyer lift to be placed in wheelchair.  She states it took 3 people to move patient to get to the hospital today.   - She has plans to move back to [place], [place].  - Will touch base with PT again for recommendations as daughter and patient report they would need assistance.  ?SNF vs HH.   FEN/GI: None  Prophylaxis:Xarelto   Disposition:  Transition to po medication tomorrow. Dispo pending clinical improvement.   Subjective:  Patient feels much better. Leg swelling improved and is  breathing fine.   Objective: Temp:  [97.6 F (36.4 C)-98.1 F (36.7 C)] 97.6 F (36.4 C) (03/12 1204) Pulse Rate:  [72-92] 72 (03/12 1204) Resp:  [16-20] 20 (03/12 1204) BP: (93-100)/(60-74) 93/61 (03/12 1204) SpO2:  [93 %-96 %] 93 % (03/12 1204) Weight:  [436 lb (197.8 kg)] 436 lb (197.8 kg) (03/12 0417)   Physical Exam: General: Morbidly obese 61 yo female, in NAD  Eyes: PERRLA, EOMI  ENTM: MMM  Neck: Supple Cardiovascular: RRR, no MRG  Respiratory: Mild expiratory wheezing, no crackles noted, comfortable work of breathing Gastrointestinal: BS+,  non-tender, distended MSK: 1+ bilateral edema, 1+ thigh edema . ROM limited by body habitus. , SCDs in place  Derm: no rashes or wounds noted  Neuro: AAOx3, no focal deficits  Psych: normal mood and affect   Wt Readings from Last 10 Encounters:  10/16/16 (!) 436 lb (197.8 kg)  09/30/16 (!) 464 lb (210.5 kg)  08/30/16 (!) 480 lb (217.7 kg)   Laboratory:  Recent Labs Lab 10/12/16 0932 10/13/16 0416 10/14/16 0758  WBC 4.9 4.9 4.7  HGB 9.6* 9.2* 9.3*  HCT 31.7* 29.9* 30.4*  PLT 122* 130* 126*    Recent Labs Lab 10/12/16 1332  10/14/16 0758 10/15/16 0557 10/16/16 0533  NA  --   < > 142 140 140  K  --   < > 3.6 3.7 3.6  CL  --   < > 105 104 100*  CO2  --   < > 31 30 33*  BUN  --   < > 27* 24* 24*  CREATININE  --   < > 1.04* 1.08* 1.05*  CALCIUM  --   < > 8.8* 8.7* 8.7*  PROT 8.5*  --   --   --   --   BILITOT 1.0  --   --   --   --   ALKPHOS 115  --   --   --   --   ALT 20  --   --   --   --   AST 34  --   --   --   --   GLUCOSE  --   < > 90 86 88  < > = values in this interval not displayed. Imaging/Diagnostic Tests: No results found.  Freddrick March, MD 10/16/2016, 2:18 PM PGY-1, [facility] Family Medicine FPTS Intern pager: [phone], text pages welcome

## 2016-10-16 NOTE — Progress Notes (Signed)
Progress Note  Patient Name: Zoe Cobb Date of Encounter: 10/16/2016  Primary Cardiologist: Dr. Charlton Haws  Subjective   At baseline no dyspnea or chest pain or palpitations. Has CNA comes to house Bid and has Hoya lift Indicates compliance with meds at home   Inpatient Medications    Scheduled Meds: . aspirin EC  81 mg Oral Daily  . cholecalciferol  1,000 Units Oral Daily  . furosemide  40 mg Intravenous BID  . ipratropium-albuterol  3 mL Nebulization Once  . metoprolol tartrate  12.5 mg Oral BID  . rivaroxaban  20 mg Oral Daily  . sodium chloride flush  3 mL Intravenous Q12H    PRN Meds: acetaminophen **OR** acetaminophen, ipratropium-albuterol, polyethylene glycol   Vital Signs    Vitals:   10/15/16 1257 10/15/16 1931 10/16/16 0417 10/16/16 0833  BP: 102/61 98/74 100/73 93/60  Pulse: 89 92 80 84  Resp: 18 16 17 20   Temp: 98.4 F (36.9 C) 97.7 F (36.5 C) 98.1 F (36.7 C)   TempSrc: Oral Oral Oral   SpO2:  96% 95% 93%  Weight:   (!) 436 lb (197.8 kg)   Height:        Intake/Output Summary (Last 24 hours) at 10/16/16 0914 Last data filed at 10/16/16 0421  Gross per 24 hour  Intake              483 ml  Output             2850 ml  Net            -2367 ml   Filed Weights   10/14/16 0639 10/15/16 0300 10/16/16 0417  Weight: (!) 455 lb (206.4 kg) (!) 451 lb (204.6 kg) (!) 436 lb (197.8 kg)    Telemetry    Atrial fibrillation 10/16/2016 . Personally reviewed. Rates 70-90   ECG    Tracing from 10/12/2008 shows rate-controlled atrial fibrillation with low voltage and nonspecific T-wave changes. Personally reviewed.  Physical Exam   BP 93/60 (BP Location: Right Wrist)   Pulse 84   Temp 98.1 F (36.7 C) (Oral)   Resp 20   Ht 5\' 1"  (1.549 m)   Wt (!) 436 lb (197.8 kg)   SpO2 93%   BMI 82.38 kg/m  Affect appropriate Morbidly obese black female  HEENT: normal Neck supple with no adenopathy JVP normal no bruits no thyromegaly Lungs clear with  no wheezing and good diaphragmatic motion Heart:  S1/S2 no murmur, no rub, gallop or click PMI normal Abdomen: benighn, BS positve, no tenderness, no AAA no bruit.  No HSM or HJR Distal pulses intact with no bruits Plus one bilateral edema Neuro non-focal Skin warm and dry No muscular weakness   Labs    Chemistry Recent Labs Lab 10/12/16 1332  10/14/16 0758 10/15/16 0557 10/16/16 0533  NA  --   < > 142 140 140  K  --   < > 3.6 3.7 3.6  CL  --   < > 105 104 100*  CO2  --   < > 31 30 33*  GLUCOSE  --   < > 90 86 88  BUN  --   < > 27* 24* 24*  CREATININE  --   < > 1.04* 1.08* 1.05*  CALCIUM  --   < > 8.8* 8.7* 8.7*  PROT 8.5*  --   --   --   --   ALBUMIN 2.9*  --   --   --   --  AST 34  --   --   --   --   ALT 20  --   --   --   --   ALKPHOS 115  --   --   --   --   BILITOT 1.0  --   --   --   --   GFRNONAA  --   < > 56* 53* 55*  GFRAA  --   < > >60 >60 >60  ANIONGAP  --   < > 6 6 7   < > = values in this interval not displayed.   Hematology  Recent Labs Lab 10/12/16 0932 10/13/16 0416 10/14/16 0758  WBC 4.9 4.9 4.7  RBC 3.72* 3.55* 3.58*  HGB 9.6* 9.2* 9.3*  HCT 31.7* 29.9* 30.4*  MCV 85.2 84.2 84.9  MCH 25.8* 25.9* 26.0  MCHC 30.3 30.8 30.6  RDW 18.9* 18.8* 18.8*  PLT 122* 130* 126*    Cardiac Enzymes  Recent Labs Lab 10/12/16 1638 10/12/16 2151 10/13/16 0416  TROPONINI 0.15* <0.03 <0.03     Recent Labs Lab 10/12/16 0950  TROPIPOC 0.00     BNP  Recent Labs Lab 10/12/16 0932  BNP 387.4*     Radiology    Chest x-ray 10/12/2016: FINDINGS: There is marked cardiomegaly with pulmonary vascular congestion. No consolidative process, pneumothorax or effusion. No acute bony abnormality.  IMPRESSION: Cardiomegaly and pulmonary vascular congestion.  Cardiac Studies   Echocardiogram 08/29/2016: Study Conclusions  - Left ventricle: The cavity size was mildly dilated. Wall   thickness was increased in a pattern of mild LVH. Systolic    function was normal. The estimated ejection fraction was in the   range of 55% to 60%. Wall motion was normal; there were no   regional wall motion abnormalities. Features are consistent with   a pseudonormal left ventricular filling pattern, with concomitant   abnormal relaxation and increased filling pressure (grade 2   diastolic dysfunction). - Mitral valve: Calcified annulus. - Left atrium: The atrium was moderately dilated. - Right atrium: The atrium was mildly dilated. - Pulmonary arteries: Systolic pressure was mildly increased. PA   peak pressure: 42 mm Hg (S).  Impressions:  - Technically difficult; definity used; normal LV systolic   function; grade 2 diastolic dysfunction; moderate LAE; mild RAE;   mild TR with mildly elevated pulmonary pressure.  Patient Profile     61 y.o. 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 This is her 3rd admission for CHF in the last 2 months.   Assessment & Plan    1. Acute on chronic diastolic heart failure. LVEF 55-60%. Intake and output are not accurate but weight is coming down and she does report some improvement. Change to PO diuretics in am appears to be at baseline   2. Persistent atrial fibrillation. Rate controlled on Lopressor and stroke prophylaxis with Xarelto. Being considered for elective cardioversion after one month anticoagulation (Xarelto instituted on February 22). No indication for more acute TEE/DCC significant risks of propofol and esophageal intubation given body hapitus   3. Essential hypertension.  Well controlled.  Continue current medications and low sodium Dash type diet.     Continue current diuretic regimen, Lopressor, and Xarelto.  Signed, Charlton Haws, MD  10/16/2016, 9:14 AM

## 2016-10-17 LAB — BASIC METABOLIC PANEL
ANION GAP: 7 (ref 5–15)
BUN: 25 mg/dL — ABNORMAL HIGH (ref 6–20)
CHLORIDE: 100 mmol/L — AB (ref 101–111)
CO2: 34 mmol/L — ABNORMAL HIGH (ref 22–32)
Calcium: 8.7 mg/dL — ABNORMAL LOW (ref 8.9–10.3)
Creatinine, Ser: 1.02 mg/dL — ABNORMAL HIGH (ref 0.44–1.00)
GFR calc non Af Amer: 57 mL/min — ABNORMAL LOW (ref >60)
Glucose, Bld: 93 mg/dL (ref 65–99)
Potassium: 3.8 mmol/L (ref 3.5–5.1)
SODIUM: 141 mmol/L (ref 135–145)

## 2016-10-17 MED ORDER — TORSEMIDE 20 MG PO TABS
60.0000 mg | ORAL_TABLET | Freq: Every day | ORAL | Status: DC
  Administered 2016-10-17 – 2016-10-18 (×2): 60 mg via ORAL
  Filled 2016-10-17 (×2): qty 3

## 2016-10-17 NOTE — Progress Notes (Signed)
Nutrition Brief Note  Patient identified due to Low Braden score  Wt Readings from Last 15 Encounters:  10/17/16 (!) 436 lb (197.8 kg)  09/30/16 (!) 464 lb (210.5 kg)  08/30/16 (!) 480 lb (217.7 kg)    Body mass index is 82.38 kg/m. Patient meets criteria for Morbid Obesity based on current BMI.   Current diet order is Heart Healthy, patient is consuming approximately 75-100% of meals at this time. Pt reports following a low sodium diet at home. She states that she doesn't use any salt, eats lot of fruits and vegetables, and snacks on homemade popcorn with salt-free seasoning. She states that she knows all foods have some sodium, but doesn't know exactly when it is too much. RD provided "Low Sodium Nutrition Therapy" handout from the [employer]. Discussed sodium limit per day and sodium guidelines per serving of food. Encouraged pt to continue eating fresh fruits and vegetables with every meal. Pt denies any additional questions or concerns at this time. Labs and medications reviewed.   No further nutrition interventions warranted at this time. If nutrition issues arise, please consult RD.   Dorothea Ogle RD, LDN, CSP Inpatient Clinical Dietitian Pager: [phone] After Hours Pager: [phone]

## 2016-10-17 NOTE — Progress Notes (Signed)
Progress Note  Patient Name: Zoe Cobb Date of Encounter: 10/17/2016  Primary Cardiologist: Dr. Charlton Haws  Subjective   AT baseline mild dyspnea   Inpatient Medications    Scheduled Meds: . aspirin EC  81 mg Oral Daily  . cholecalciferol  1,000 Units Oral Daily  . ipratropium-albuterol  3 mL Nebulization Once  . rivaroxaban  20 mg Oral Daily  . sodium chloride flush  3 mL Intravenous Q12H  . torsemide  60 mg Oral Daily    PRN Meds: acetaminophen **OR** acetaminophen, ipratropium-albuterol, polyethylene glycol   Vital Signs    Vitals:   10/16/16 0417 10/16/16 0833 10/16/16 1204 10/17/16 0517  BP: 100/73 93/60 93/61  (!) 98/59  Pulse: 80 84 72 87  Resp: 17 20 20 18   Temp: 98.1 F (36.7 C)  97.6 F (36.4 C) 97.7 F (36.5 C)  TempSrc: Oral  Oral Oral  SpO2: 95% 93% 93% 98%  Weight: (!) 436 lb (197.8 kg)   (!) 436 lb (197.8 kg)  Height:        Intake/Output Summary (Last 24 hours) at 10/17/16 0812 Last data filed at 10/17/16 0522  Gross per 24 hour  Intake              440 ml  Output             4525 ml  Net            -4085 ml   Filed Weights   10/15/16 0300 10/16/16 0417 10/17/16 0517  Weight: (!) 451 lb (204.6 kg) (!) 436 lb (197.8 kg) (!) 436 lb (197.8 kg)    Telemetry    Atrial fibrillation 10/17/2016 . Personally reviewed. Rates 70-90   ECG    Tracing from 10/12/2008 shows rate-controlled atrial fibrillation with low voltage and nonspecific T-wave changes. Personally reviewed.  Physical Exam   BP (!) 98/59 (BP Location: Right Wrist)   Pulse 87   Temp 97.7 F (36.5 C) (Oral)   Resp 18   Ht 5\' 1"  (1.549 m)   Wt (!) 436 lb (197.8 kg) Comment: bedscale  SpO2 98%   BMI 82.38 kg/m  Affect appropriate Morbidly obese black female  HEENT: normal Neck supple with no adenopathy JVP normal no bruits no thyromegaly Lungs clear with no wheezing and good diaphragmatic motion Heart:  S1/S2 no murmur, no rub, gallop or click PMI normal Abdomen:  benighn, BS positve, no tenderness, no AAA no bruit.  No HSM or HJR Distal pulses intact with no bruits Plus one bilateral edema Neuro non-focal Skin warm and dry No muscular weakness   Labs    Chemistry Recent Labs Lab 10/12/16 1332  10/15/16 0557 10/16/16 0533 10/17/16 0421  NA  --   < > 140 140 141  K  --   < > 3.7 3.6 3.8  CL  --   < > 104 100* 100*  CO2  --   < > 30 33* 34*  GLUCOSE  --   < > 86 88 93  BUN  --   < > 24* 24* 25*  CREATININE  --   < > 1.08* 1.05* 1.02*  CALCIUM  --   < > 8.7* 8.7* 8.7*  PROT 8.5*  --   --   --   --   ALBUMIN 2.9*  --   --   --   --   AST 34  --   --   --   --   ALT  20  --   --   --   --   ALKPHOS 115  --   --   --   --   BILITOT 1.0  --   --   --   --   GFRNONAA  --   < > 53* 55* 57*  GFRAA  --   < > >60 >60 >60  ANIONGAP  --   < > 6 7 7   < > = values in this interval not displayed.   Hematology  Recent Labs Lab 10/12/16 0932 10/13/16 0416 10/14/16 0758  WBC 4.9 4.9 4.7  RBC 3.72* 3.55* 3.58*  HGB 9.6* 9.2* 9.3*  HCT 31.7* 29.9* 30.4*  MCV 85.2 84.2 84.9  MCH 25.8* 25.9* 26.0  MCHC 30.3 30.8 30.6  RDW 18.9* 18.8* 18.8*  PLT 122* 130* 126*    Cardiac Enzymes  Recent Labs Lab 10/12/16 1638 10/12/16 2151 10/13/16 0416  TROPONINI 0.15* <0.03 <0.03     Recent Labs Lab 10/12/16 0950  TROPIPOC 0.00     BNP  Recent Labs Lab 10/12/16 0932  BNP 387.4*     Radiology    Chest x-ray 10/12/2016: FINDINGS: There is marked cardiomegaly with pulmonary vascular congestion. No consolidative process, pneumothorax or effusion. No acute bony abnormality.  IMPRESSION: Cardiomegaly and pulmonary vascular congestion.  Cardiac Studies   Echocardiogram 08/29/2016: Study Conclusions  - Left ventricle: The cavity size was mildly dilated. Wall   thickness was increased in a pattern of mild LVH. Systolic   function was normal. The estimated ejection fraction was in the   range of 55% to 60%. Wall motion was normal;  there were no   regional wall motion abnormalities. Features are consistent with   a pseudonormal left ventricular filling pattern, with concomitant   abnormal relaxation and increased filling pressure (grade 2   diastolic dysfunction). - Mitral valve: Calcified annulus. - Left atrium: The atrium was moderately dilated. - Right atrium: The atrium was mildly dilated. - Pulmonary arteries: Systolic pressure was mildly increased. PA   peak pressure: 42 mm Hg (S).  Impressions:  - Technically difficult; definity used; normal LV systolic   function; grade 2 diastolic dysfunction; moderate LAE; mild RAE;   mild TR with mildly elevated pulmonary pressure.  Patient Profile     61 y.o. 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 This is her 3rd admission for CHF in the last 2 months.   Assessment & Plan    1. Acute on chronic diastolic heart failure. LVEF 55-60%. Intake and output are not accurate but weight is coming down and she does report some improvement. PO demedex.  2. Persistent atrial fibrillation. Rate controlled on Lopressor and stroke prophylaxis with Xarelto. Being considered for elective cardioversion after one month anticoagulation (Xarelto instituted on February 22). No indication for more acute TEE/DCC significant risks of propofol and esophageal intubation given body hapitus   3. Essential hypertension.  Well controlled.  Continue current medications and low sodium Dash type diet.     Continue current diuretic regimen, Lopressor, and Xarelto. Possible d/c in am with outpatient f/u   Signed, Charlton Haws, MD  10/17/2016, 8:12 AM

## 2016-10-17 NOTE — Progress Notes (Signed)
Family Medicine Teaching Service Daily Progress Note Intern Pager: [phone]  Patient name: Zoe Cobb Medical record number: [id number] Date of birth: March 31, 1956 Age: 61 y.o. Gender: female  Primary Care Provider: Norm Salt, PA Consultants: None Code Status: FULL   Pt Overview and Major Events to Date:  Admit 3/8   Assessment and Plan: #Dyspnea:  Satting well on RA at 98%.  Weight on admission 467 lbs>>436.  Upon chart review, dry weight is unknown but Jan 2018 was 480 lbs and unclear if she was fluid overloaded at that time.  Weight -31 lbs since admission and is diuresing well on this dose.  Breathing and LE edema improved since admission. -Cardiology, appreciate recs:  UOP may not be accurately recorded previously given her body habitus but weight has come down to 436 and she appears to be at her baseline.  UOP by Foley cath -4L.  Will d/c Lasix and transition to 60 mg po Torsemide daily today.   Would still plan to pursue elective cardioversion of her afib as this may also help improve her diastolic heart failure symptoms.    PM Lopressor was held for blood pressure 90s/60s.  Will monitor her overnight on po Torsemide with likely discharge home in the AM  -Renal function at baseline with Cr 1.02  -consider CPAP on discharge   -Strict I's and O's   - Pulse oximetry   #HpEF: Echo in January 2018 with LVEF of 55-60%. G2DD. PA peak pressure 42 mmHg. Recent lipid panel and A1C in January - ASCVD risk is low 3.0%.   - Aspirin 80 mg  -Continue Lasix    #Wheezing, improved: Remote hx of smoking more than 20 years ago making COPD less likely. PA pressure 42 mmHg, noting some pulmonary HTN mostly likely due to OHS, no history of sleep study.  Will consider CPAP on d/c.  Still with some mild expiratory wheezing on physical exam.  - Duonebs Q6 prn   #A-fib; EKG with afib. Rate controlled on Lopressor and stroke prophylaxis with Xarelto. Being considered for elective cardioversion  after one month anticoagulation (Xarelto instituted on February 22). No indication for more acute TEE/DCC significant risks of propofol and esophageal intubation given body habitus. TSH slight elevated, T4 wnl.   - Continue Xarelto and Lopressor   # HTN: Patient hypotensive overnight to 100s/50s.  BP this AM 98/59.  -Monitor Cr   # Hep C, stable : on Mavyret at home. Per patient finished last dose of treatment last Friday.  - LFTs with normal AST, ALT  - CMP in am  #Thrombocytopenia (noted since January): Platelets of 122, history of hepatitis C - LFTs wnl, PTT elev 24.3, INR 2.14 normal   # Morbid obesity: PT/OT seen and no follow up recommended.  Patient is unable to move and requires a Hoyer lift to be placed in wheelchair.  She states it took 3 people to move patient to get to the hospital today.   - She has plans to move back to [place], [place].  - Will touch base with PT again this morning for recommendations as daughter and patient report they would need assistance.  ?SNF vs HH.   FEN/GI: None  Prophylaxis:Xarelto   Disposition:  Transition to po medication today.  Will observe overnight on Torsemide.  PT to see .   Subjective:  Patient feels much better. Leg swelling improved and is breathing fine. Agreeable to meeting with PT again.  No concerns at this time and she  feels like she is at her baseline.   Objective: Temp:  [97.7 F (36.5 C)-97.9 F (36.6 C)] 97.9 F (36.6 C) (03/13 1206) Pulse Rate:  [86-87] 86 (03/13 1206) Resp:  [18] 18 (03/13 1206) BP: (93-98)/(59-73) 93/73 (03/13 1206) SpO2:  [97 %-98 %] 97 % (03/13 1206) Weight:  [436 lb (197.8 kg)] 436 lb (197.8 kg) (03/13 0517)   Physical Exam: General: Morbidly obese 61 yo female, in NAD  Eyes: PERRLA, EOMI  ENTM: MMM  Neck: Supple Cardiovascular: RRR, no MRG  Respiratory: Mild expiratory wheezing, no crackles noted, comfortable work of breathing Gastrointestinal: BS+, non-tender,  distended MSK: 1+ bilateral edema. ROM limited by body habitus. , SCDs in place  Derm: no rashes or wounds noted  Neuro: AAOx3, no focal deficits  Psych: normal mood and affect   Wt Readings from Last 10 Encounters:  10/17/16 (!) 436 lb (197.8 kg)  09/30/16 (!) 464 lb (210.5 kg)  08/30/16 (!) 480 lb (217.7 kg)   Laboratory:  Recent Labs Lab 10/12/16 0932 10/13/16 0416 10/14/16 0758  WBC 4.9 4.9 4.7  HGB 9.6* 9.2* 9.3*  HCT 31.7* 29.9* 30.4*  PLT 122* 130* 126*    Recent Labs Lab 10/12/16 1332  10/15/16 0557 10/16/16 0533 10/17/16 0421  NA  --   < > 140 140 141  K  --   < > 3.7 3.6 3.8  CL  --   < > 104 100* 100*  CO2  --   < > 30 33* 34*  BUN  --   < > 24* 24* 25*  CREATININE  --   < > 1.08* 1.05* 1.02*  CALCIUM  --   < > 8.7* 8.7* 8.7*  PROT 8.5*  --   --   --   --   BILITOT 1.0  --   --   --   --   ALKPHOS 115  --   --   --   --   ALT 20  --   --   --   --   AST 34  --   --   --   --   GLUCOSE  --   < > 86 88 93  < > = values in this interval not displayed. Imaging/Diagnostic Tests: No results found.  Freddrick March, MD 10/17/2016, 2:11 PM PGY-1, [facility] FPTS Intern pager: [phone], text pages welcome

## 2016-10-17 NOTE — Evaluation (Signed)
Physical Therapy Evaluation Patient Details Name: Zoe Cobb MRN: [id number] DOB: July 25, 1956 Today's Date: 10/17/2016   History of Present Illness  Pt admitted with CHF exacerbation. PMH: recent dx of CHF, afib, morbid obesity, HTN  Clinical Impression  Patient seen for reassessment given patient status and functional decline. This patient is very well known to me as I have worked with her on previous admissions. Patient does demonstrate some decline in her normal functional status related to her ability to reposition in bed, assist with bed mobility and maintain sitting balance. Today's re-assessment looked at patients ability to participate in therapy as well as her functional status split over 2 visits. During initial visit worked with patient on bed mobility and functional activities (use of UE and LEs to weight shift in bed, reposition and assist). While limited compared to prior functional status patient was able to engage. During return visit, assist patient with OOB to chair activities using maxislide sheets and lateral draw sheet transfer. Once in chair, patient was able to engage in both UE and LE activities.    Given current functional status and ability to engage in therapies with assist, feel patient could benefit from ST SNF placement for decreased burden of care goals. Will trial acute PT to improve activity tolerance OOB in chair and focus on improved ability to assist during mobility and hygiene activities.   Patient very motivated and appreciative for assist.     Follow Up Recommendations SNF;Supervision - Intermittent    Equipment Recommendations  None recommended by PT    Recommendations for Other Services       Precautions / Restrictions Precautions Precautions: Fall Restrictions Weight Bearing Restrictions: No      Mobility  Bed Mobility Overal bed mobility: Needs Assistance Bed Mobility: Rolling Rolling: Max assist;+2 for physical assistance          General bed mobility comments: At times required 3 person assist for rolling in bed, limited strength in UEs compared to previous admissions, poor ability to reach sidelying  Transfers Overall transfer level: Needs assistance   Transfers: Lateral/Scoot Transfers          Lateral/Scoot Transfers: +2 physical assistance;+2 safety/equipment;With slide board (maxi slide sheets to shuttle chair) General transfer comment: patient able to tolerate lateral slide transfer using maxi slide sheets from bed to stretcher position of bariatric shuttle chair.  Ambulation/Gait                Stairs            Wheelchair Mobility    Modified Rankin (Stroke Patients Only)       Balance Overall balance assessment: Needs assistance   Sitting balance-Leahy Scale: Fair Sitting balance - Comments: able to perform dynamic shifts while sitting in chair                                     Pertinent Vitals/Pain      Home Living Family/patient expects to be discharged to:: Private residence Living Arrangements: Alone Available Help at Discharge: Personal care attendant;Family;Available PRN/intermittently Type of Home: House Home Access: Ramped entrance     Home Layout: One level Home Equipment: Wheelchair - power;Hospital bed [facility] lift) Additional Comments: Patient has aide 3 hours am and 2 hours pm.  Uses electric w/c during day.    Prior Function Level of Independence: Needs assistance   Gait / Transfers Assistance Needed: hasn't ambulated in  2 years, up with hoyer lift by aide, uses electric w/c  ADL's / Homemaking Assistance Needed: Aide does meal prep, housekeeping and assist with ADL, pt can self feed and groom with set up,  Comments: pt has an aide 5 hours a day, 3 hours in morning, 2 hours in evening, sister lives nearby and checks on     Hand Dominance   Dominant Hand: Right    Extremity/Trunk Assessment        Lower Extremity  Assessment RLE Deficits / Details: Strength grossly 2/5 LLE Deficits / Details: Strength grossly 2/5    Cervical / Trunk Assessment Cervical / Trunk Assessment:  (increased body habitus)  Communication   Communication: No difficulties  Cognition Arousal/Alertness: Awake/alert Behavior During Therapy: WFL for tasks assessed/performed Overall Cognitive Status: Within Functional Limits for tasks assessed                      General Comments      Exercises  Bilateral UE elevation, extension and flexion ROM in seated position Bilateral LEs AAROM activities Weight shift activities performed in chair for trunk control (anterior and posterior with use of shuttle chair hand bars.   Assessment/Plan    PT Assessment Patent does not need any further PT services  PT Problem List         PT Treatment Interventions      PT Goals (Current goals can be found in the Care Plan section)  Acute Rehab PT Goals Patient Stated Goal: to get her legs stronger PT Goal Formulation: With patient Time For Goal Achievement: 10/31/16 Potential to Achieve Goals: Fair    Frequency     Barriers to discharge        Co-evaluation               End of Session   Activity Tolerance: Patient tolerated treatment well;Patient limited by fatigue Patient left: in chair;with call bell/phone within reach Nurse Communication: Mobility status;Need for lift equipment (Has aide who uses hoyer for transfers) PT Visit Diagnosis: Muscle weakness (generalized) (M62.81);Other abnormalities of gait and mobility (R26.89)         Time: 9562-1308 PT Time Calculation (min) (ACUTE ONLY): 20 min   Charges:   PT Evaluation $PT Re-evaluation: 1 Procedure PT Treatments $Therapeutic Activity: 8-22 mins   PT G Codes:         Fabio Asa 10/24/16, 7:12 PM Charlotte Crumb, PT DPT  [phone]

## 2016-10-18 LAB — BASIC METABOLIC PANEL
Anion gap: 9 (ref 5–15)
BUN: 29 mg/dL — ABNORMAL HIGH (ref 6–20)
CALCIUM: 8.7 mg/dL — AB (ref 8.9–10.3)
CO2: 31 mmol/L (ref 22–32)
CREATININE: 1.15 mg/dL — AB (ref 0.44–1.00)
Chloride: 99 mmol/L — ABNORMAL LOW (ref 101–111)
GFR calc Af Amer: 57 mL/min — ABNORMAL LOW (ref >60)
GFR calc non Af Amer: 50 mL/min — ABNORMAL LOW (ref >60)
GLUCOSE: 91 mg/dL (ref 65–99)
Potassium: 3.6 mmol/L (ref 3.5–5.1)
Sodium: 139 mmol/L (ref 135–145)

## 2016-10-18 MED ORDER — TORSEMIDE 20 MG PO TABS: 60.0000 mg | ORAL_TABLET | Freq: Every day | ORAL | 0 refills | Status: DC

## 2016-10-18 MED ORDER — METOPROLOL TARTRATE 25 MG PO TABS
25.0000 mg | ORAL_TABLET | Freq: Two times a day (BID) | ORAL | Status: DC
  Administered 2016-10-18: 25 mg via ORAL
  Filled 2016-10-18: qty 1

## 2016-10-18 NOTE — Clinical Social Work Note (Signed)
CSW met with patient to discuss SNF placement. CSW made patient aware of Medicaid barriers: Medicaid does not pay for therapies, she would have to stay a minimum of 30 days for Medicaid to pay, and her Medicaid check except for $30 would go directly to the facility. Patient stated that this is not an option for her as she has to pay her rent. CSW provided patient with a SNF list in case she is ever interested in long-term case. MD, RN, and RNCM notified of plan for patient to discharge home.  CSW signing off. Consult again if any other social work needs arise.  Zoe Cobb, [place]

## 2016-10-18 NOTE — Discharge Instructions (Signed)
You were admitted to the hospital for shortness of breath and leg swelling which was related to your CHF.  We gave you IV Lasix to get the fluid off and monitored your urine output.  We transitioned you over to Torsemide which you can continue taking at home.  Please see your PCP for a follow up appointment after discharge to monitor your fluid status.    Information on my medicine - XARELTO (Rivaroxaban)  This medication education was reviewed with me or my healthcare representative as part of my discharge preparation.   Why was Xarelto prescribed for you? Xarelto was prescribed for you to reduce the risk of a blood clot forming that can cause a stroke if you have a medical condition called atrial fibrillation (a type of irregular heartbeat).  What do you need to know about xarelto ? Take your Xarelto ONCE DAILY at the same time every day with your evening meal. If you have difficulty swallowing the tablet whole, you may crush it and mix in applesauce just prior to taking your dose.  Take Xarelto exactly as prescribed by your doctor and DO NOT stop taking Xarelto without talking to the doctor who prescribed the medication.  Stopping without other stroke prevention medication to take the place of Xarelto may increase your risk of developing a clot that causes a stroke.  Refill your prescription before you run out.  After discharge, you should have regular check-up appointments with your healthcare provider that is prescribing your Xarelto.  In the future your dose may need to be changed if your kidney function or weight changes by a significant amount.  What do you do if you miss a dose? If you are taking Xarelto ONCE DAILY and you miss a dose, take it as soon as you remember on the same day then continue your regularly scheduled once daily regimen the next day. Do not take two doses of Xarelto at the same time or on the same day.   Important Safety Information A possible side effect  of Xarelto is bleeding. You should call your healthcare provider right away if you experience any of the following: ? Bleeding from an injury or your nose that does not stop. ? Unusual colored urine (red or dark brown) or unusual colored stools (red or black). ? Unusual bruising for unknown reasons. ? A serious fall or if you hit your head (even if there is no bleeding).  Some medicines may interact with Xarelto and might increase your risk of bleeding while on Xarelto. To help avoid this, consult your healthcare provider or pharmacist prior to using any new prescription or non-prescription medications, including herbals, vitamins, non-steroidal anti-inflammatory drugs (NSAIDs) and supplements.  This website has more information on Xarelto: VisitDestination.com.br.

## 2016-10-18 NOTE — Progress Notes (Signed)
Physical Therapy Treatment Patient Details Name: Zoe Cobb MRN: [id number] DOB: May 05, 1956 Today's Date: 10/18/2016    History of Present Illness Pt admitted with CHF exacerbation. PMH: recent dx of CHF, afib, morbid obesity, HTN    PT Comments    Patient seen for OOB mobility and exercise program. Patient is extremely motivated to participate. Patient performed extended UE and LE program in chair today. HR elevated to mid 120s but decreased to 90s at rest. Continue to feel patient would benefit tremendously from ST SNF to decreased burden of care given activity tolerance improvements and motivation.  Will continue to see and progress as tolerated.  Follow Up Recommendations  SNF;Supervision - Intermittent     Equipment Recommendations  None recommended by PT    Recommendations for Other Services       Precautions / Restrictions Precautions Precautions: Fall Restrictions Weight Bearing Restrictions: No    Mobility  Bed Mobility Overal bed mobility: Needs Assistance Bed Mobility: Rolling Rolling: Max assist;+2 for physical assistance         General bed mobility comments: patient max to initiate roll in bilateral directions, moderate assist to maintain sidelying with use of Rail  Transfers Overall transfer level: Needs assistance Equipment used:  (maxi slide sheets) Transfers: Lateral/Scoot Transfers          Lateral/Scoot Transfers: +2 physical assistance;+2 safety/equipment;With slide board (maxi slide sheets to shuttle chair) General transfer comment: patient able to tolerate lateral slide transfer using maxi slide sheets from bed to stretcher position of bariatric shuttle chair.  Ambulation/Gait                 Stairs            Wheelchair Mobility    Modified Rankin (Stroke Patients Only)       Balance Overall balance assessment: Needs assistance   Sitting balance-Leahy Scale: Fair                               Cognition Arousal/Alertness: Awake/alert Behavior During Therapy: WFL for tasks assessed/performed Overall Cognitive Status: Within Functional Limits for tasks assessed                      Exercises Other Exercises Other Exercises: Performed UE work out program involving bilateral UE punches, trunk rotation and trunk flexion activities.  4 bouts 30 seconds Other Exercises: limited motion LEs long arc quds 2 sets of 10 Other Exercises: limited motion bilateral LEs limited motion abduction/ adduction resisted 2 sets 10    General Comments        Pertinent Vitals/Pain Pain Assessment: No/denies pain    Home Living                      Prior Function            PT Goals (current goals can now be found in the care plan section) Acute Rehab PT Goals Patient Stated Goal: to get her legs stronger PT Goal Formulation: With patient Time For Goal Achievement: 10/31/16 Potential to Achieve Goals: Fair Progress towards PT goals: Progressing toward goals    Frequency    Min 2X/week      PT Plan Current plan remains appropriate    Co-evaluation             End of Session   Activity Tolerance: Patient tolerated treatment well;Patient limited by fatigue Patient left: in  chair;with call bell/phone within reach Nurse Communication: Mobility status;Need for lift equipment (Has aide who uses hoyer for transfers) PT Visit Diagnosis: Muscle weakness (generalized) (M62.81);Other abnormalities of gait and mobility (R26.89)     Time: 1610-9604 PT Time Calculation (min) (ACUTE ONLY): 20 min  Charges:  $Therapeutic Activity: 8-22 mins                    G Codes:       Fabio Asa 11-12-16, 10:14 AM Charlotte Crumb, PT DPT  [phone]

## 2016-10-18 NOTE — Progress Notes (Signed)
Paged MD regarding pt discharge paper work does not go over pt diuretic, torsemide, not on AVS Awaiting call back    Elige Radon

## 2016-10-18 NOTE — Progress Notes (Signed)
Pt discharged with PTAR. PTAR transporters at bedside now  [employer]

## 2016-10-18 NOTE — Progress Notes (Signed)
Progress Note  Patient Name: Zoe Cobb Date of Encounter: 10/18/2016  Primary Cardiologist: Dr. Charlton Haws  Subjective   AT baseline mild dyspnea   Inpatient Medications    Scheduled Meds: . aspirin EC  81 mg Oral Daily  . cholecalciferol  1,000 Units Oral Daily  . ipratropium-albuterol  3 mL Nebulization Once  . rivaroxaban  20 mg Oral Daily  . sodium chloride flush  3 mL Intravenous Q12H  . torsemide  60 mg Oral Daily    PRN Meds: acetaminophen **OR** acetaminophen, ipratropium-albuterol, polyethylene glycol   Vital Signs    Vitals:   10/17/16 0517 10/17/16 1206 10/17/16 1949 10/18/16 0544  BP: (!) 98/59 93/73 99/74  108/74  Pulse: 87 86 86 94  Resp: 18 18  18   Temp: 97.7 F (36.5 C) 97.9 F (36.6 C) 97.6 F (36.4 C) 97.8 F (36.6 C)  TempSrc: Oral Oral Oral Oral  SpO2: 98% 97% 97% 96%  Weight: (!) 436 lb (197.8 kg)   (!) 434 lb (196.9 kg)  Height:        Intake/Output Summary (Last 24 hours) at 10/18/16 0903 Last data filed at 10/18/16 0840  Gross per 24 hour  Intake             2710 ml  Output             1825 ml  Net              885 ml   Filed Weights   10/16/16 0417 10/17/16 0517 10/18/16 0544  Weight: (!) 436 lb (197.8 kg) (!) 436 lb (197.8 kg) (!) 434 lb (196.9 kg)    Telemetry    Atrial fibrillation 10/18/2016 . Personally reviewed. Rates 100-120   ECG    Tracing from 10/12/2008 shows rate-controlled atrial fibrillation with low voltage and nonspecific T-wave changes. Personally reviewed.  Physical Exam   BP 108/74 (BP Location: Left Wrist)   Pulse 94   Temp 97.8 F (36.6 C) (Oral)   Resp 18   Ht 5\' 1"  (1.549 m)   Wt (!) 434 lb (196.9 kg) Comment: bedscale  SpO2 96%   BMI 82.00 kg/m  Affect appropriate Morbidly obese black female  HEENT: normal Neck supple with no adenopathy JVP normal no bruits no thyromegaly Lungs clear with no wheezing and good diaphragmatic motion Heart:  S1/S2 no murmur, no rub, gallop or click PMI  normal Abdomen: benighn, BS positve, no tenderness, no AAA no bruit.  No HSM or HJR Distal pulses intact with no bruits Plus one bilateral edema Neuro non-focal Skin warm and dry No muscular weakness   Labs    Chemistry Recent Labs Lab 10/12/16 1332  10/16/16 0533 10/17/16 0421 10/18/16 0523  NA  --   < > 140 141 139  K  --   < > 3.6 3.8 3.6  CL  --   < > 100* 100* 99*  CO2  --   < > 33* 34* 31  GLUCOSE  --   < > 88 93 91  BUN  --   < > 24* 25* 29*  CREATININE  --   < > 1.05* 1.02* 1.15*  CALCIUM  --   < > 8.7* 8.7* 8.7*  PROT 8.5*  --   --   --   --   ALBUMIN 2.9*  --   --   --   --   AST 34  --   --   --   --  ALT 20  --   --   --   --   ALKPHOS 115  --   --   --   --   BILITOT 1.0  --   --   --   --   GFRNONAA  --   < > 55* 57* 50*  GFRAA  --   < > >60 >60 57*  ANIONGAP  --   < > 7 7 9   < > = values in this interval not displayed.   Hematology  Recent Labs Lab 10/12/16 0932 10/13/16 0416 10/14/16 0758  WBC 4.9 4.9 4.7  RBC 3.72* 3.55* 3.58*  HGB 9.6* 9.2* 9.3*  HCT 31.7* 29.9* 30.4*  MCV 85.2 84.2 84.9  MCH 25.8* 25.9* 26.0  MCHC 30.3 30.8 30.6  RDW 18.9* 18.8* 18.8*  PLT 122* 130* 126*    Cardiac Enzymes  Recent Labs Lab 10/12/16 1638 10/12/16 2151 10/13/16 0416  TROPONINI 0.15* <0.03 <0.03     Recent Labs Lab 10/12/16 0950  TROPIPOC 0.00     BNP  Recent Labs Lab 10/12/16 0932  BNP 387.4*     Radiology    Chest x-ray 10/12/2016: FINDINGS: There is marked cardiomegaly with pulmonary vascular congestion. No consolidative process, pneumothorax or effusion. No acute bony abnormality.  IMPRESSION: Cardiomegaly and pulmonary vascular congestion.  Cardiac Studies   Echocardiogram 08/29/2016: Study Conclusions  - Left ventricle: The cavity size was mildly dilated. Wall   thickness was increased in a pattern of mild LVH. Systolic   function was normal. The estimated ejection fraction was in the   range of 55% to 60%. Wall  motion was normal; there were no   regional wall motion abnormalities. Features are consistent with   a pseudonormal left ventricular filling pattern, with concomitant   abnormal relaxation and increased filling pressure (grade 2   diastolic dysfunction). - Mitral valve: Calcified annulus. - Left atrium: The atrium was moderately dilated. - Right atrium: The atrium was mildly dilated. - Pulmonary arteries: Systolic pressure was mildly increased. PA   peak pressure: 42 mm Hg (S).  Impressions:  - Technically difficult; definity used; normal LV systolic   function; grade 2 diastolic dysfunction; moderate LAE; mild RAE;   mild TR with mildly elevated pulmonary pressure.  Patient Profile     61 y.o. 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 This is her 3rd admission for CHF in the last 2 months.   Assessment & Plan    1. Acute on chronic diastolic heart failure. LVEF 55-60%. Intake and output are not accurate but weight is coming down and she does report some improvement. PO demedex.  2. Persistent atrial fibrillation. Stroke prophylaxis with Xarelto.  Rate up this am will resume lopressor Being considered for elective cardioversion after one month anticoagulation (Xarelto instituted on February 22). No indication for more acute TEE/DCC significant risks of propofol and esophageal intubation given body hapitus   3. Essential hypertension.  Well controlled.  Continue current medications and low sodium Dash type diet.     Continue current diuretic regimen, Lopressor, and Xarelto. To be d/c to SNF / for rehab  Will sign off   Signed, Charlton Haws, MD  10/18/2016, 9:03 AM

## 2016-10-18 NOTE — Care Management Note (Addendum)
Case Management Note  Patient Details  Name: Zoe Cobb MRN: [id number] Date of Birth: 15-Aug-1955  Subjective/Objective:      Admitted with CHF             Action/Plan: Patient lives at home; Primary Care Provider: Norm Salt, PA; has private insurance with Medicaid with prescription drug coverage; she has a person care service provided by [facility] - a nurses aide assist her in her care 7 days a week ( 3 hrs in the morning and 2 hrs in the afternoon); her hospital bed and hoyar lift has been repaired by [facility]; she is excited about going home today; she will be transported home via non emergent ambulance; address verified. Patient is active with [facility] for [facility] as prior to admission. Vickie with [facility] is aware of discharge home today.  Expected Discharge Date:  10/18/2016              Expected Discharge Plan:  Home/Self Care  Discharge planning Services  CM Consult  Status of Service:  In process, will continue to follow  [name] [phone] 10/18/2016, 11:02 AM

## 2016-10-18 NOTE — Progress Notes (Signed)
MD added diuretic to pt AVS. Pt discharge educations went over at bedside with pt. Pt IV discontinued catheter intact and telemetry removed. Pt has all belongings and discharge paperwork    Elige Radon

## 2016-10-18 NOTE — Progress Notes (Signed)
Transitions of Care Pharmacy Note  Plan:  Educated on torsemide.  Addressed concerns regarding dosing instructions. Follow-up has not been scheduled yet.  --------------------------------------------- Zoe Cobb is an 61 y.o. female who presents with a chief complaint CHF exacerbation. In anticipation of discharge, pharmacy has reviewed this patient's prior to admission medication history, as well as current inpatient medications listed per the [facility].  Current medication indications, dosing, frequency, and notable side effects reviewed with patient. patient verbalized understanding of current inpatient medication regimen and is aware that the After Visit Summary when presented, will represent the most accurate medication list at discharge.   Zoe Cobb expressed concerns regarding when to take her medications. During this stay, she was transitioned from Lasix to torsemide. I informed her that torsemide is to help get rid of excess fluid and is best taken earlier in the day to avoid nighttime awakening. Patient expressed understanding and had no further questions.   Assessment: Understanding of regimen: good Understanding of indications: good Potential of compliance: good Barriers to Obtaining Medications: No  Patient instructed to contact inpatient pharmacy team with further questions or concerns if needed.    Time spent preparing for discharge counseling: 10 min Time spent counseling patient: 10 min   Thank you for allowing pharmacy to be a part of this patient's care.  Oda Cogan, PharmD Candidate 10/18/2016, 12:58 PM  Gwyndolyn Kaufman Bernette Redbird), PharmD  PGY1 Pharmacy Resident Pager: [phone] 10/18/2016 1:01 PM

## 2016-10-18 NOTE — Progress Notes (Signed)
Family Medicine Teaching Service Daily Progress Note Intern Pager: [phone]  Patient name: Zoe Cobb Medical record number: [id number] Date of birth: 1955/10/25 Age: 61 y.o. Gender: female  Primary Care Provider: Norm Salt, PA Consultants: None Code Status: FULL   Pt Overview and Major Events to Date:  Admit 3/8   Assessment and Plan: #Dyspnea:  Continues to do well with current diuresis plan.  Satting well on RA at 96% with no shortness of breath.   Weight on admission 467 [phone].  Upon chart review, dry weight is unknown but Jan 2018 was 480 lbs and unclear if she was fluid overloaded at that time.  Weight -33 lbs since admission and is diuresing well on this dose.  Breathing and LE edema improved since admission. - Transitioned to 60 mg po Torsemide daily on 3/13  - Foley removed overnight.  UOP 1.8L daily total.   -consider CPAP on discharge   -Strict I's and O's   - Pulse oximetry   #HpEF: Echo in January 2018 with LVEF of 55-60%. G2DD. PA peak pressure 42 mmHg. Recent lipid panel and A1C in January - ASCVD risk is low 3.0%.   - Aspirin 80 mg  -Continue Torsemide   #Wheezing, improved: Remote hx of smoking more than 20 years ago making COPD less likely. PA pressure 42 mmHg, noting some pulmonary HTN mostly likely due to OHS, no history of sleep study.  Will consider CPAP on d/c.   - Duonebs Q6 prn   #A-fib; EKG with afib. Rate controlled on Lopressor and stroke prophylaxis with Xarelto. Being considered for elective cardioversion after one month anticoagulation (Xarelto instituted on February 22). No indication for more acute TEE/DCC significant risks of propofol and esophageal intubation given body habitus. TSH slight elevated, T4 wnl.   -Continue Xarelto.  Discontinue Lopressor on discharge in setting of diastolic heart failure and hypotension.     # HTN: Patient hypotensive overnight to 100s/50s.  BP this AM 108/74.  -Cr this AM 1.15   # Hep C, stable :  on Mavyret at home. Per patient finished last dose of treatment last Friday.  - LFTs with normal AST, ALT   #Thrombocytopenia (noted since January): Platelets of 122, history of hepatitis C - LFTs wnl, PTT elev 24.3, INR 2.14 normal   # Morbid obesity: PT/OT seen and no follow up recommended.  Patient is unable to move and requires a Hoyer lift to be placed in wheelchair.   - She has plans to move back to [place], [place].  Lives with her daughter currently and she discussed needing help at home.  - PT seen again yesterday and recommended SNF with intermittent supervision.    FEN/GI: None  Prophylaxis:Xarelto   Disposition:  Discharge home today.   Subjective:  Patient feels well with no concerns this morning.  Denies shortness of breath and leg swelling.  PT at bedside.   Objective: Temp:  [97.6 F (36.4 C)-98.4 F (36.9 C)] 98.4 F (36.9 C) (03/14 0930) Pulse Rate:  [86-103] 103 (03/14 0930) Resp:  [18] 18 (03/14 0544) BP: (93-108)/(65-74) 102/65 (03/14 0930) SpO2:  [96 %-97 %] 96 % (03/14 0544) Weight:  [434 lb (196.9 kg)] 434 lb (196.9 kg) (03/14 0544)   Physical Exam: General: Morbidly obese 61 yo female, in NAD  Eyes: PERRLA, EOMI  ENTM: MMM  Neck: Supple Cardiovascular: RRR, no MRG  Respiratory: CTA B/L, comfortable work of breathing Gastrointestinal: BS+, non-tender, distended MSK: No tenderness, mild edema, ROM limited by  body habitus  Derm: no rashes or wounds noted  Neuro: AAOx3, no focal deficits  Psych: normal mood and affect   Wt Readings from Last 10 Encounters:  10/18/16 (!) 434 lb (196.9 kg)  09/30/16 (!) 464 lb (210.5 kg)  08/30/16 (!) 480 lb (217.7 kg)   Laboratory:  Recent Labs Lab 10/12/16 0932 10/13/16 0416 10/14/16 0758  WBC 4.9 4.9 4.7  HGB 9.6* 9.2* 9.3*  HCT 31.7* 29.9* 30.4*  PLT 122* 130* 126*    Recent Labs Lab 10/12/16 1332  10/16/16 0533 10/17/16 0421 10/18/16 0523  NA  --   < > 140 141 139  K  --   < > 3.6  3.8 3.6  CL  --   < > 100* 100* 99*  CO2  --   < > 33* 34* 31  BUN  --   < > 24* 25* 29*  CREATININE  --   < > 1.05* 1.02* 1.15*  CALCIUM  --   < > 8.7* 8.7* 8.7*  PROT 8.5*  --   --   --   --   BILITOT 1.0  --   --   --   --   ALKPHOS 115  --   --   --   --   ALT 20  --   --   --   --   AST 34  --   --   --   --   GLUCOSE  --   < > 88 93 91  < > = values in this interval not displayed. Imaging/Diagnostic Tests: No results found.  Freddrick March, MD 10/18/2016, 11:38 AM PGY-1, [facility] FPTS Intern pager: [phone], text pages welcome

## 2016-10-18 NOTE — Progress Notes (Signed)
Foley catheter removed at 01:25 as pt has been transitioned to PO diuretics and pt has impending discharge this AM. Will continue to monitor.

## 2016-10-22 NOTE — Discharge Summary (Signed)
Family Medicine Teaching [facility] Discharge Summary  Patient name: Zoe Cobb Medical record number: [id number] Date of birth: [birth year] Age: 61 y.o. Gender: female Date of Admission: 10/12/2016  Date of Discharge: 10/18/2016 Admitting Physician: Tobey Grim, MD  Primary Care Provider: Norm Salt, PA Consultants: Cardiology  Indication for Hospitalization: Shortness of breath, worsening leg swelling   Discharge Diagnoses/Problem List:  Shortness of breath CHF exacerbation  Atrial fibrillation  HTN Hepatitis C Obesity Thrombocytopenia  Disposition: Home  Discharge Condition: Stable, improved  Discharge Exam:  General: Morbidly obese 61 y.o. female, in NAD  Eyes: PERRLA, EOMI  ENTM: MMM  Neck: Supple Cardiovascular: RRR, no MRG  Respiratory: CTA B/L, comfortable work of breathing Gastrointestinal: BS+, non-tender, distended MSK: No tenderness, mild edema, ROM limited by body habitus  Derm: no rashes or wounds noted  Neuro: AAOx3, no focal deficits  Psych: normal mood and affect   Brief Hospital Course:   Tilford Pillar presented to the ED with shortness of breath with worsening leg swelling.  She was treated for CHF exacerbation with IV Lasix.  Last echo January 2018 with LVEF 55-60%, G2DD.  Given her obesity, difficult to perform daily weights and her weight at baseline was unknown but in January 2018 noted to be 480 lbs.  Unclear if she was volume overloaded at that time.  Patient with asymptomatic hypotension to 90s/60s with not much UOP.  Unclear if UOP was accurate and Cardiology was consulted.  Foley catheter was then inserted for better accuracy of UOP.  Weight on admission 467 lbs and during admission she diuresed to 434 lbs.  Shortness of breath resolved and leg swelling significant improved.  Per Cardiology recommendation, she was transitioned to Torsemide 60 mg daily upon discharge for better gut absorption.    Also with history of atrial fibrillation  on Xarelto and Lopressor.  Given her low blood pressures, her Lopressor was held for several doses and Lasix was continued.  On discharge, Lopressor was discontinued in setting of diastolic heart failure and hypotension.     At time of discharge, patient's shortness of breath had resolved and per patient, she was back at her baseline.  PT and OT saw her and recommended SNF.  She has plans to move to [place] and get set up in a facility there.   Issues for Follow Up:  1. Fluid status.  Patient at home on 20 mg Lasix.  This hospitalization was discharged home on 60 mg Torsemide daily.  Please assess fluid status and adjust as necessary. 2. Discontinued Lopressor on discharge given lower blood pressures.  Please check BP at follow up visit.    Significant Procedures: None  Significant Labs and Imaging:  No results for input(s): WBC, HGB, HCT, PLT in the last 168 hours.  Recent Labs Lab 10/16/16 0533 10/17/16 0421 10/18/16 0523  NA 140 141 139  K 3.6 3.8 3.6  CL 100* 100* 99*  CO2 33* 34* 31  GLUCOSE 88 93 91  BUN 24* 25* 29*  CREATININE 1.05* 1.02* 1.15*  CALCIUM 8.7* 8.7* 8.7*    Results/Tests Pending at Time of Discharge: None  Discharge Medications:  Allergies as of 10/18/2016      Reactions   Penicillins Swelling   Has patient had a PCN reaction causing immediate rash, facial/tongue/throat swelling, SOB or lightheadedness with hypotension: Yes Has patient had a PCN reaction causing severe rash involving mucus membranes or skin necrosis: No Has patient had a PCN reaction that required hospitalization  No Has patient had a PCN reaction occurring within the last 10 years: No If all of the above answers are "NO", then may proceed with Cephalosporin use.      Medication List    STOP taking these medications   furosemide 40 MG tablet Commonly known as:  LASIX   metoprolol tartrate 25 MG tablet Commonly known as:  LOPRESSOR     TAKE these medications   aspirin 81 MG  EC tablet Take 1 tablet (81 mg total) by mouth daily.   cholecalciferol 1000 units tablet Commonly known as:  VITAMIN D Take 1,000 Units by mouth daily.   MAVYRET 100-40 MG Tabs Generic drug:  Glecaprevir-Pibrentasvir Take 3 tablets by mouth daily.   polyethylene glycol packet Commonly known as:  MIRALAX / GLYCOLAX Take 17 g by mouth daily as needed for mild constipation.   rivaroxaban 20 MG Tabs tablet Commonly known as:  XARELTO Take 1 tablet (20 mg total) by mouth daily.   torsemide 20 MG tablet Commonly known as:  DEMADEX Take 3 tablets (60 mg total) by mouth daily.       Discharge Instructions: Please refer to Patient Instructions section of EMR for full details.  Patient was counseled important signs and symptoms that should prompt return to medical care, changes in medications, dietary instructions, activity restrictions, and follow up appointments.   Follow-Up Appointments: Follow-up Information    Norm Salt, PA. Go on 10/26/2016.   Specialty:  Physician Assistant Why:  @3pm  Contact information: [address] [place] [address] [phone]        [facility] Follow up.   Specialty:  Home Health Services Why:  A home health care nurse will go to your home Contact information: [address] [place] [address] [phone]          Freddrick March, MD 10/22/2016, 9:49 PM PGY-1, [facility]

## 2017-01-23 ENCOUNTER — Other Ambulatory Visit: Payer: Self-pay | Admitting: Adult Health

## 2017-01-23 DIAGNOSIS — Z1231 Encounter for screening mammogram for malignant neoplasm of breast: Secondary | ICD-10-CM

## 2017-03-26 ENCOUNTER — Ambulatory Visit
Admission: RE | Admit: 2017-03-26 | Discharge: 2017-03-26 | Disposition: A | Discharge status: Home / Self Care | Payer: Medicaid Other | Source: Ambulatory Visit | Attending: Adult Health | Admitting: Adult Health

## 2017-03-26 DIAGNOSIS — Z1231 Encounter for screening mammogram for malignant neoplasm of breast: Secondary | ICD-10-CM

## 2017-03-27 ENCOUNTER — Other Ambulatory Visit: Payer: Self-pay | Admitting: Adult Health

## 2017-03-27 DIAGNOSIS — R928 Other abnormal and inconclusive findings on diagnostic imaging of breast: Secondary | ICD-10-CM

## 2017-04-02 ENCOUNTER — Ambulatory Visit
Admission: RE | Admit: 2017-04-02 | Discharge: 2017-04-02 | Disposition: A | Discharge status: Home / Self Care | Payer: Medicaid Other | Source: Ambulatory Visit | Attending: Adult Health | Admitting: Adult Health

## 2017-04-02 ENCOUNTER — Ambulatory Visit: Payer: Medicaid Other

## 2017-04-02 DIAGNOSIS — R928 Other abnormal and inconclusive findings on diagnostic imaging of breast: Secondary | ICD-10-CM

## 2018-03-13 ENCOUNTER — Other Ambulatory Visit: Payer: Self-pay | Admitting: Adult Health

## 2018-03-13 DIAGNOSIS — Z1231 Encounter for screening mammogram for malignant neoplasm of breast: Secondary | ICD-10-CM

## 2018-04-12 ENCOUNTER — Ambulatory Visit: Payer: Self-pay

## 2018-04-12 ENCOUNTER — Ambulatory Visit
Admission: RE | Admit: 2018-04-12 | Discharge: 2018-04-12 | Disposition: A | Discharge status: Home / Self Care | Payer: Medicaid Other | Source: Ambulatory Visit | Attending: Adult Health | Admitting: Adult Health

## 2018-04-12 DIAGNOSIS — Z1231 Encounter for screening mammogram for malignant neoplasm of breast: Secondary | ICD-10-CM

## 2018-07-20 IMAGING — DX DG CHEST 1V PORT
1 series · 1 of 1 positions shown · non-contrast
Comparison: August 29, 2016

CLINICAL DATA: Shortness of breath.

EXAM:
PORTABLE CHEST 1 VIEW

[chest ap]
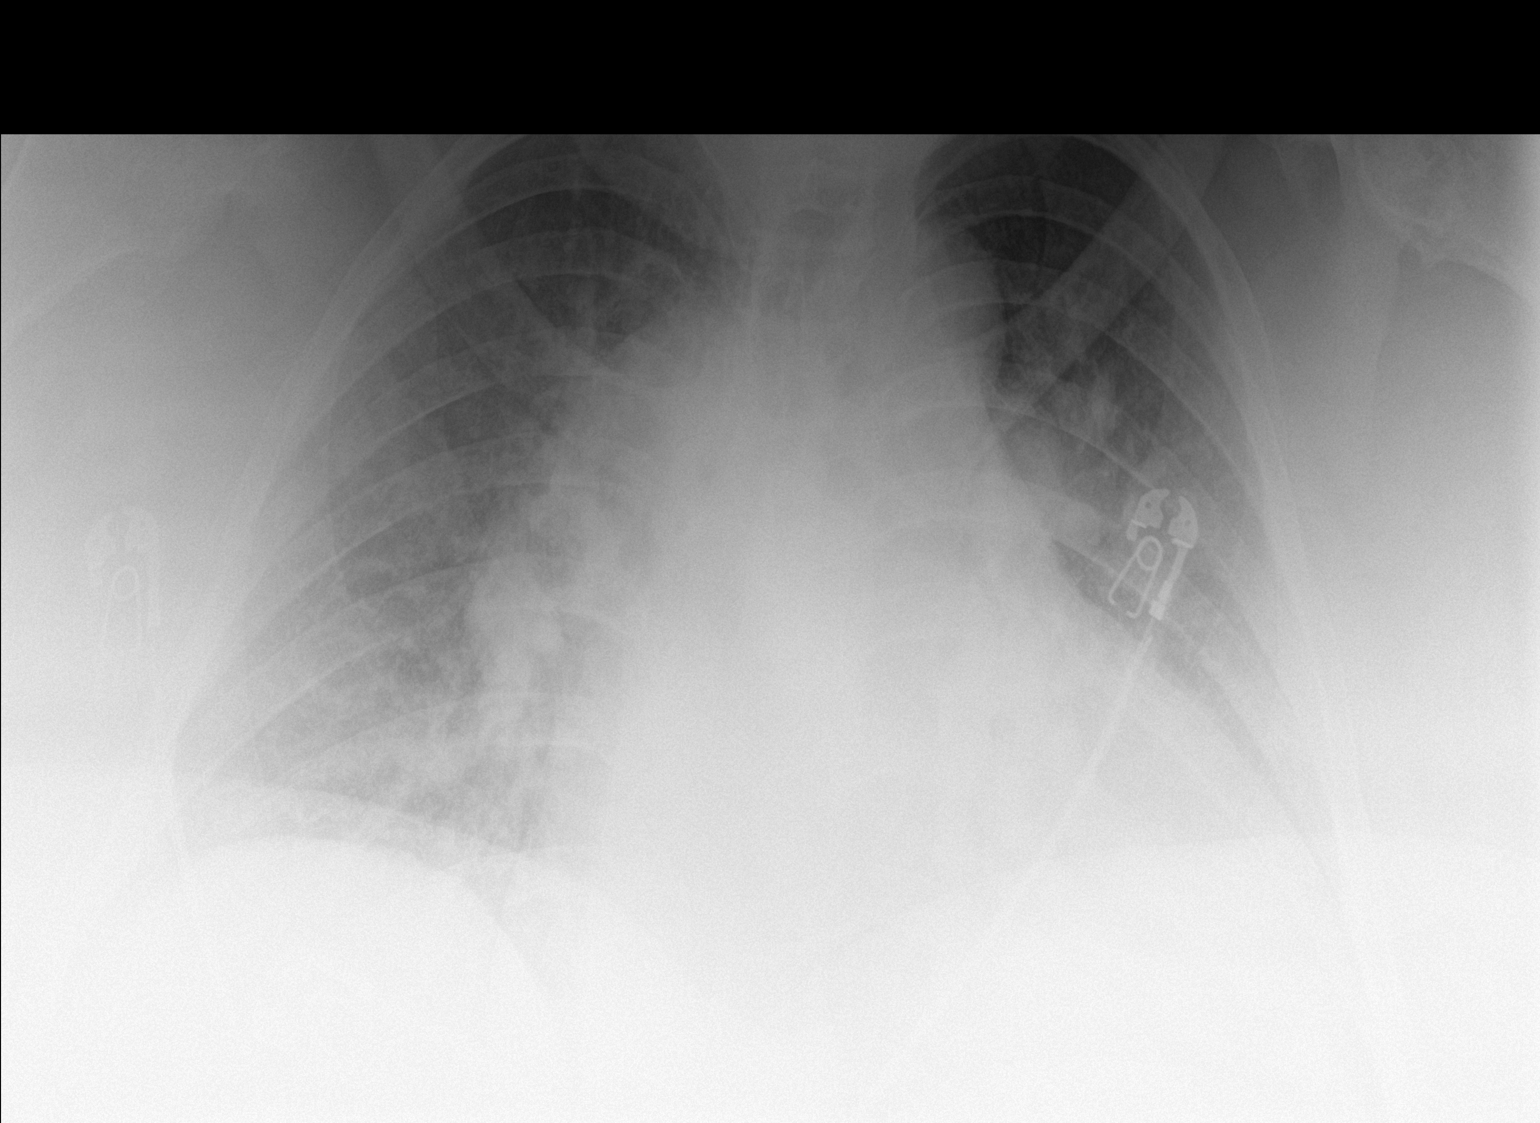

[1 of 1 positions shown; findings below may reference images not displayed]

FINDINGS: There is interstitial edema. There is cardiomegaly with pulmonary
venous hypertension. No airspace consolidation. No adenopathy. There
is degenerative change in each shoulder.
IMPRESSION: Findings indicative of congestive heart failure. No airspace
consolidation.

## 2018-10-02 ENCOUNTER — Encounter: Payer: Self-pay | Admitting: Cardiology

## 2018-10-02 ENCOUNTER — Ambulatory Visit (INDEPENDENT_AMBULATORY_CARE_PROVIDER_SITE_OTHER): Payer: Medicaid Other | Admitting: Cardiology

## 2018-10-02 VITALS — BP 111/75 | HR 67 | Ht 62.0 in

## 2018-10-02 DIAGNOSIS — Z0189 Encounter for other specified special examinations: Secondary | ICD-10-CM | POA: Insufficient documentation

## 2018-10-02 DIAGNOSIS — Z7901 Long term (current) use of anticoagulants: Secondary | ICD-10-CM | POA: Diagnosis not present

## 2018-10-02 DIAGNOSIS — Z5181 Encounter for therapeutic drug level monitoring: Secondary | ICD-10-CM | POA: Diagnosis not present

## 2018-10-02 DIAGNOSIS — I480 Paroxysmal atrial fibrillation: Secondary | ICD-10-CM | POA: Diagnosis not present

## 2018-10-02 DIAGNOSIS — I5032 Chronic diastolic (congestive) heart failure: Secondary | ICD-10-CM

## 2018-10-02 LAB — POCT INR: INR: 2.2 (ref 2.0–3.0)

## 2018-10-02 LAB — PROTIME-INR: INR: 2.3 — AB (ref 0.9–1.1)

## 2018-10-02 NOTE — Progress Notes (Signed)
Subjective:   Zoe Cobb, female    DOB: 1956-04-16, 62 y.o.   MRN: [id number]  Alvester Chou, NP:  Chief Complaint  Patient presents with  . Congestive Heart Failure    6 month F/U    HPI: Zoe Cobb  is a 63 y.o. female  with chronic diastolic heart failure, paroxysmal atrial fibrillation found in February 2018, and morbid obesity is essentially wheelchair-bound.  In the past, she has had vaginal bleeding with Xarelto and also Coumadin; however, was restarted on Coumadin therapy 6 months ago and has not had any recurrence of vaginal bleeding. Not had vaginal examination due to difficulty with examination due to her weight.   She is here today on 6 month office visit. She denies any chest pain or shortness of breath. Leg edema has improved with torsemide. She does not feel that she has lost any weight recently. She does not weigh herself. Does try to exercise at her facility with arm exercises.    Past Medical History:  Diagnosis Date  . CHF (congestive heart failure) ([place])   . Heart failure ([place])   . Hypertension   . Morbid obesity ([place])   . Paroxysmal atrial fibrillation ([place])     History reviewed. No pertinent surgical history.  Family History  Problem Relation Age of Onset  . Hypertension Mother   . Diabetes Mother   . Stomach cancer Father   . Kidney disease Sister   . Diabetes Sister   . Diabetes Sister   . Heart attack Brother   . HIV/AIDS Brother   . Diabetes Brother     Social History   Socioeconomic History  . Marital status: Single    Spouse name: Not on file  . Number of children: 2  . Years of education: Not on file  . Highest education level: Not on file  Occupational History  . Not on file  Social Needs  . Financial resource strain: Not on file  . Food insecurity:    Worry: Not on file    Inability: Not on file  . Transportation needs:    Medical: Not on file    Non-medical: Not on file  Tobacco Use  . Smoking status:  Former Smoker    Packs/day: 1.00    Types: Cigarettes  . Smokeless tobacco: Never Used  Substance and Sexual Activity  . Alcohol use: No    Alcohol/week: 0.0 standard drinks    Comment: once in a while  . Drug use: No  . Sexual activity: Not on file  Lifestyle  . Physical activity:    Days per week: Not on file    Minutes per session: Not on file  . Stress: Not on file  Relationships  . Social connections:    Talks on phone: Not on file    Gets together: Not on file    Attends religious service: Not on file    Active member of club or organization: Not on file    Attends meetings of clubs or organizations: Not on file    Relationship status: Not on file  . Intimate partner violence:    Fear of current or ex partner: Not on file    Emotionally abused: Not on file    Physically abused: Not on file    Forced sexual activity: Not on file  Other Topics Concern  . Not on file  Social History Narrative   Pt lives by herself, she completed hs.  Current Meds  Medication Sig  . cholecalciferol (VITAMIN D) 1000 units tablet Take 1,000 Units by mouth daily.  . metoprolol succinate (TOPROL-XL) 50 MG 24 hr tablet Take 1 tablet by mouth daily.  . polyethylene glycol (MIRALAX / GLYCOLAX) packet Take 17 g by mouth daily as needed for mild constipation.  . torsemide (DEMADEX) 20 MG tablet Take 3 tablets (60 mg total) by mouth daily.  . vitamin B-12 (CYANOCOBALAMIN) 1000 MCG tablet Take 1,000 mcg by mouth daily.  Marland Kitchen warfarin (COUMADIN) 5 MG tablet Take 1 tablet by mouth daily.     Review of Systems  Constitution: Negative for decreased appetite, malaise/fatigue, weight gain and weight loss.  Eyes: Negative for visual disturbance.  Cardiovascular: Negative for chest pain, claudication, dyspnea on exertion, leg swelling, orthopnea, palpitations and syncope.  Respiratory: Negative for hemoptysis and wheezing.   Endocrine: Negative for cold intolerance and heat intolerance.    Hematologic/Lymphatic: Does not bruise/bleed easily.  Skin: Negative for nail changes.  Musculoskeletal: Positive for muscle weakness (leg weakness and is wheelchair bound). Negative for myalgias.  Gastrointestinal: Negative for abdominal pain, change in bowel habit, nausea and vomiting.  Neurological: Negative for difficulty with concentration, dizziness, focal weakness and headaches.  Psychiatric/Behavioral: Negative for altered mental status and suicidal ideas.  All other systems reviewed and are negative.      Objective:     Blood pressure 111/75, pulse 67, height 5' 2"  (1.575 m), SpO2 96 %.  Echocardiogram [09/2016]: Left ventricle: The cavity size was mildly dilated. Wall thickness was increased in a pattern of mild LVH. Systolic function was normal. The estimated ejection fraction was in the range of 55% to 60%. Wall motion was normal; there were no regional wall motion abnormalities. Features are consistent with a pseudonormal left ventricular filling pattern, with concomitant abnormal relaxation and increased filling pressure (grade 2 diastolic dysfunction). - Mitral valve: Calcified annulus. - Left atrium: The atrium was moderately dilated. - Right atrium: The atrium was mildly dilated. - Pulmonary arteries: Systolic pressure was mildly increased. PA peak pressure: 42 mm Hg (S).    Physical Exam  Constitutional: She is oriented to person, place, and time. Vital signs are normal. She appears well-developed and well-nourished.  HENT:  Head: Normocephalic and atraumatic.  Neck: Normal range of motion.  Cardiovascular: Normal rate, normal heart sounds and intact distal pulses. An irregularly irregular rhythm present.  Pulses:      Dorsalis pedis pulses are 2+ on the right side and 2+ on the left side.  Pulses difficult to palpate due to bodily habitus  Pulmonary/Chest: Effort normal and breath sounds normal. No accessory muscle usage. No respiratory distress.   Abdominal: Soft. Bowel sounds are normal.  Panus present  Musculoskeletal: Normal range of motion.  Neurological: She is alert and oriented to person, place, and time.  Skin: Skin is warm and dry.  Vitals reviewed.  EKG 10/02/2018: Normal sinus rhythm at 71 bpm, normal axis, no evidence of ischemia.        Assessment & Recommendations:   1. Paroxysmal atrial fibrillation (HCC) Patient noted to be in sinus rhythm today and has not had any known recurrence of atrial fibrillation.  She is on Coumadin therapy as she had vaginal bleeding with Xarelto in the past.  Has not had any recurrence of vaginal bleeding since being on Coumadin.  2. Chronic diastolic (congestive) heart failure ([place]) Patient is without clinical evidence of decompensated heart failure.  Had improvement in leg swelling with daily torsemide.  Diet modifications have been discussed in preventing heart failure exacerbation.  3. Laboratory examination 03/14/2018: CBC normal. Creatinine 0.82, eGFR 76/87, Potassium 4.4, Albumin 3.4, Alkaline phosphatase 126, CMP otherwise normal. Cholesterol 155, Triglycerides 74, HDL 46, LDL 94.   4. Morbid obesity due to excess calories ([place]) Lengthy discussion with the patient regarding the importance of efforts toward weight loss.  I have discussed options for exercise.  Would consider physical therapy referral to increase her strength in her legs to hopefully not make her wheelchair dependent.  5. Long term (current) use of anticoagulants INR is therapeutic today.  No changes were made to Coumadin.  She will follow-up with repeat INR check in 1 month.  Tolerating Coumadin therapy well without any bleeding diathesis.  Overall, patient is doing well without any complaints today.  I will see her back in 6 months or sooner if problems.     Jeri Lager, FNP-C [facility], [place] Office: [phone] Fax: [phone]

## 2018-10-05 DIAGNOSIS — Z7901 Long term (current) use of anticoagulants: Secondary | ICD-10-CM | POA: Insufficient documentation

## 2018-10-31 ENCOUNTER — Ambulatory Visit (INDEPENDENT_AMBULATORY_CARE_PROVIDER_SITE_OTHER): Payer: Medicare Other | Admitting: Cardiology

## 2018-10-31 DIAGNOSIS — Z5181 Encounter for therapeutic drug level monitoring: Secondary | ICD-10-CM

## 2018-10-31 DIAGNOSIS — I4891 Unspecified atrial fibrillation: Secondary | ICD-10-CM | POA: Diagnosis not present

## 2018-10-31 DIAGNOSIS — Z7901 Long term (current) use of anticoagulants: Secondary | ICD-10-CM

## 2018-10-31 LAB — POCT INR
INR: 2.5 (ref 2.0–3.0)
INR: 2.5 (ref 2.0–3.0)

## 2018-12-05 ENCOUNTER — Other Ambulatory Visit: Payer: Self-pay

## 2018-12-05 ENCOUNTER — Ambulatory Visit (INDEPENDENT_AMBULATORY_CARE_PROVIDER_SITE_OTHER): Payer: Medicare Other | Admitting: Cardiology

## 2018-12-05 DIAGNOSIS — Z5181 Encounter for therapeutic drug level monitoring: Secondary | ICD-10-CM

## 2018-12-05 DIAGNOSIS — I4891 Unspecified atrial fibrillation: Secondary | ICD-10-CM

## 2018-12-05 DIAGNOSIS — Z7901 Long term (current) use of anticoagulants: Secondary | ICD-10-CM | POA: Diagnosis not present

## 2018-12-05 LAB — POCT INR
INR: 2.3 (ref 2.0–3.0)
INR: 2.3 (ref 2.0–3.0)

## 2018-12-05 NOTE — Progress Notes (Signed)
INR within range. Tolerating coumadin well, no bleeding reported. Continue with current regimen. Will follow up in 1 month for recheck.

## 2018-12-06 ENCOUNTER — Other Ambulatory Visit: Payer: Self-pay | Admitting: Cardiology

## 2018-12-06 NOTE — Telephone Encounter (Signed)
Please fill

## 2019-01-03 ENCOUNTER — Other Ambulatory Visit: Payer: Self-pay

## 2019-01-03 ENCOUNTER — Ambulatory Visit (INDEPENDENT_AMBULATORY_CARE_PROVIDER_SITE_OTHER): Payer: Medicare Other | Admitting: Cardiology

## 2019-01-03 DIAGNOSIS — Z7901 Long term (current) use of anticoagulants: Secondary | ICD-10-CM

## 2019-01-03 DIAGNOSIS — Z5181 Encounter for therapeutic drug level monitoring: Secondary | ICD-10-CM | POA: Diagnosis not present

## 2019-01-03 DIAGNOSIS — I4891 Unspecified atrial fibrillation: Secondary | ICD-10-CM

## 2019-01-03 LAB — POCT INR: INR: 2.3 (ref 2.0–3.0)

## 2019-01-03 NOTE — Progress Notes (Signed)
INR 2.3. No change made F/u in 4 wks.

## 2019-02-03 ENCOUNTER — Ambulatory Visit (INDEPENDENT_AMBULATORY_CARE_PROVIDER_SITE_OTHER): Payer: Medicare Other | Admitting: Cardiology

## 2019-02-03 ENCOUNTER — Other Ambulatory Visit: Payer: Self-pay

## 2019-02-03 DIAGNOSIS — Z7901 Long term (current) use of anticoagulants: Secondary | ICD-10-CM | POA: Diagnosis not present

## 2019-02-03 DIAGNOSIS — Z5181 Encounter for therapeutic drug level monitoring: Secondary | ICD-10-CM

## 2019-02-03 DIAGNOSIS — I480 Paroxysmal atrial fibrillation: Secondary | ICD-10-CM | POA: Diagnosis not present

## 2019-02-03 LAB — POCT INR: INR: 4.1 — AB (ref 2.0–3.0)

## 2019-02-03 NOTE — Progress Notes (Signed)
INR was elevated today, but generally patient is in range and has been for several months. Will hold dosage today and then resume same regimen. Will repeat INR in 2 weeks for close monitoring. No bleeding problems reported.

## 2019-02-05 ENCOUNTER — Other Ambulatory Visit: Payer: Self-pay | Admitting: Cardiology

## 2019-02-17 ENCOUNTER — Ambulatory Visit (INDEPENDENT_AMBULATORY_CARE_PROVIDER_SITE_OTHER): Payer: Medicare Other | Admitting: Cardiology

## 2019-02-17 ENCOUNTER — Other Ambulatory Visit: Payer: Self-pay

## 2019-02-17 DIAGNOSIS — Z5181 Encounter for therapeutic drug level monitoring: Secondary | ICD-10-CM

## 2019-02-17 DIAGNOSIS — Z7901 Long term (current) use of anticoagulants: Secondary | ICD-10-CM

## 2019-02-17 DIAGNOSIS — I480 Paroxysmal atrial fibrillation: Secondary | ICD-10-CM

## 2019-02-17 LAB — POCT INR: INR: 2.7 (ref 2.0–3.0)

## 2019-02-17 NOTE — Progress Notes (Signed)
INR within range today, will continue with current regimen. Will repeat INR in 4 weeks for follow up.

## 2019-03-17 ENCOUNTER — Encounter: Payer: Self-pay | Admitting: Cardiology

## 2019-03-18 ENCOUNTER — Other Ambulatory Visit: Payer: Self-pay

## 2019-03-18 ENCOUNTER — Ambulatory Visit (INDEPENDENT_AMBULATORY_CARE_PROVIDER_SITE_OTHER): Payer: Medicare Other | Admitting: Cardiology

## 2019-03-18 ENCOUNTER — Encounter: Payer: Self-pay | Admitting: Cardiology

## 2019-03-18 VITALS — BP 108/73 | HR 69 | Ht 62.0 in

## 2019-03-18 DIAGNOSIS — Z7901 Long term (current) use of anticoagulants: Secondary | ICD-10-CM | POA: Diagnosis not present

## 2019-03-18 DIAGNOSIS — I5032 Chronic diastolic (congestive) heart failure: Secondary | ICD-10-CM

## 2019-03-18 DIAGNOSIS — I480 Paroxysmal atrial fibrillation: Secondary | ICD-10-CM | POA: Diagnosis not present

## 2019-03-18 LAB — POCT INR: INR: 3.5 — AB (ref 2.0–3.0)

## 2019-03-18 MED ORDER — WARFARIN SODIUM 5 MG PO TABS: 5.0000 mg | ORAL_TABLET | ORAL | 3 refills | Status: DC

## 2019-03-18 NOTE — Progress Notes (Signed)
Primary Physician:  Alvester Chou, NP   Patient ID: Zoe Cobb, female    DOB: [birth year], 63 y.o.   MRN: [id number]  Subjective:    Chief Complaint  Patient presents with  . Atrial Fibrillation    HPI: Zoe Cobb  is a 63 y.o. female  with chronic diastolic heart failure, paroxysmal atrial fibrillation found in February 2018, and morbid obesity is essentially wheelchair-bound.  In the past, she has had vaginal bleeding with Xarelto and also Coumadin; however, since being back on Coumadin has not had further bleeding.Not had vaginal examination due to difficulty with examination due to her weight.   She is here today on 6 month office visit. She denies any chest pain or shortness of breath. Leg edema has been stable with torsemide. She does not feel that she has lost any weight recently. She does not weigh herself. She has not been able to exercise as the gym at her facility is closed.   Past Medical History:  Diagnosis Date  . CHF (congestive heart failure) ([place])   . Heart failure ([place])   . Hypertension   . Morbid obesity ([place])   . Paroxysmal atrial fibrillation ([place])     History reviewed. No pertinent surgical history.  Social History   Socioeconomic History  . Marital status: Single    Spouse name: Not on file  . Number of children: 2  . Years of education: Not on file  . Highest education level: Not on file  Occupational History  . Not on file  Social Needs  . Financial resource strain: Not on file  . Food insecurity    Worry: Not on file    Inability: Not on file  . Transportation needs    Medical: Not on file    Non-medical: Not on file  Tobacco Use  . Smoking status: Former Smoker    Packs/day: 1.00    Years: 9.00    Pack years: 9.00    Types: Cigarettes    Quit date: 1996    Years since quitting: 24.6  . Smokeless tobacco: Never Used  Substance and Sexual Activity  . Alcohol use: No    Alcohol/week: 0.0 standard drinks    Comment: once  in a while  . Drug use: No  . Sexual activity: Not on file  Lifestyle  . Physical activity    Days per week: Not on file    Minutes per session: Not on file  . Stress: Not on file  Relationships  . Social Herbalist on phone: Not on file    Gets together: Not on file    Attends religious service: Not on file    Active member of club or organization: Not on file    Attends meetings of clubs or organizations: Not on file    Relationship status: Not on file  . Intimate partner violence    Fear of current or ex partner: Not on file    Emotionally abused: Not on file    Physically abused: Not on file    Forced sexual activity: Not on file  Other Topics Concern  . Not on file  Social History Narrative   Pt lives by herself, she completed hs.     Review of Systems  Constitution: Negative for decreased appetite, malaise/fatigue, weight gain and weight loss.  Eyes: Negative for visual disturbance.  Cardiovascular: Negative for chest pain, claudication, dyspnea on exertion, leg swelling, orthopnea, palpitations and syncope.  Respiratory: Negative for hemoptysis and wheezing.   Endocrine: Negative for cold intolerance and heat intolerance.  Hematologic/Lymphatic: Does not bruise/bleed easily.  Skin: Negative for nail changes.  Musculoskeletal: Positive for muscle weakness (leg weakness and is wheelchair bound). Negative for myalgias.  Gastrointestinal: Negative for abdominal pain, change in bowel habit, nausea and vomiting.  Neurological: Negative for difficulty with concentration, dizziness, focal weakness and headaches.  Psychiatric/Behavioral: Negative for altered mental status and suicidal ideas.  All other systems reviewed and are negative.     Objective:  Blood pressure 108/73, pulse 69, height 5' 2"  (1.575 m), SpO2 97 %. Body mass index is 79.38 kg/m.    Physical Exam  Constitutional: She is oriented to person, place, and time. Vital signs are normal. She appears  well-developed and well-nourished.  HENT:  Head: Normocephalic and atraumatic.  Neck: Normal range of motion.  Cardiovascular: Normal rate, normal heart sounds and intact distal pulses. An irregularly irregular rhythm present.  Pulses:      Dorsalis pedis pulses are 2+ on the right side and 2+ on the left side.  Pulses difficult to palpate due to bodily habitus  Pulmonary/Chest: Effort normal and breath sounds normal. No accessory muscle usage. No respiratory distress.  Abdominal: Soft. Bowel sounds are normal.  Panus present  Musculoskeletal: Normal range of motion.  Neurological: She is alert and oriented to person, place, and time.  Skin: Skin is warm and dry.  Vitals reviewed.  Radiology: No results found.  Laboratory examination:   03/14/2018: CBC normal. Creatinine 0.82, eGFR 76/87, Potassium 4.4, Albumin 3.4, Alkaline phosphatase 126, CMP otherwise normal. Cholesterol 155, Triglycerides 74, HDL 46, LDL 94. CMP Latest Ref Rng & Units 10/18/2016 10/17/2016 10/16/2016  Glucose 65 - 99 mg/dL 91 93 88  BUN 6 - 20 mg/dL 29(H) 25(H) 24(H)  Creatinine 0.44 - 1.00 mg/dL 1.15(H) 1.02(H) 1.05(H)  Sodium 135 - 145 mmol/L 139 141 140  Potassium 3.5 - 5.1 mmol/L 3.6 3.8 3.6  Chloride 101 - 111 mmol/L 99(L) 100(L) 100(L)  CO2 22 - 32 mmol/L 31 34(H) 33(H)  Calcium 8.9 - 10.3 mg/dL 8.7(L) 8.7(L) 8.7(L)  Total Protein 6.5 - 8.1 g/dL - - -  Total Bilirubin 0.3 - 1.2 mg/dL - - -  Alkaline Phos 38 - 126 U/L - - -  AST 15 - 41 U/L - - -  ALT 14 - 54 U/L - - -   CBC Latest Ref Rng & Units 10/14/2016 10/13/2016 10/12/2016  WBC 4.0 - 10.5 K/uL 4.7 4.9 4.9  Hemoglobin 12.0 - 15.0 g/dL 9.3(L) 9.2(L) 9.6(L)  Hematocrit 36.0 - 46.0 % 30.4(L) 29.9(L) 31.7(L)  Platelets 150 - 400 K/uL 126(L) 130(L) 122(L)   Lipid Panel     Component Value Date/Time   CHOL 139 08/28/2016 1401   TRIG 53 08/28/2016 1401   HDL 58 08/28/2016 1401   CHOLHDL 2.4 08/28/2016 1401   VLDL 11 08/28/2016 1401   LDLCALC 70  08/28/2016 1401   HEMOGLOBIN A1C Lab Results  Component Value Date   HGBA1C 5.5 08/28/2016   MPG 111 08/28/2016   TSH No results for input(s): TSH in the last 8760 hours.  PRN Meds:. Medications Discontinued During This Encounter  Medication Reason  . MAVYRET 100-40 MG TABS   . warfarin (COUMADIN) 5 MG tablet Error   Current Meds  Medication Sig  . cholecalciferol (VITAMIN D) 1000 units tablet Take 1,000 Units by mouth daily.  . metoprolol succinate (TOPROL-XL) 50 MG 24 hr tablet Take 1  tablet by mouth daily.  . polyethylene glycol (MIRALAX / GLYCOLAX) packet Take 17 g by mouth daily as needed for mild constipation.  . torsemide (DEMADEX) 20 MG tablet Take 3 tablets (60 mg total) by mouth daily.  . vitamin B-12 (CYANOCOBALAMIN) 1000 MCG tablet Take 1,000 mcg by mouth daily.     Cardiac Studies:   Echocardiogram [09/2016]: Left ventricle: The cavity size was mildly dilated. Wall thickness was increased in a pattern of mild LVH. Systolic function was normal. The estimated ejection fraction was in the range of 55% to 60%. Wall motion was normal; there were no regional wall motion abnormalities. Features are consistent with a pseudonormal left ventricular filling pattern, with concomitant abnormal relaxation and increased filling pressure (grade 2 diastolic dysfunction). - Mitral valve: Calcified annulus. - Left atrium: The atrium was moderately dilated. - Right atrium: The atrium was mildly dilated. - Pulmonary arteries: Systolic pressure was mildly increased. PA peak pressure: 42 mm Hg (S).   Assessment:     ICD-10-CM   1. Paroxysmal atrial fibrillation (HCC)  I48.0 POCT INR    EKG 12-Lead  2. Chronic diastolic (congestive) heart failure (HCC)  I50.32   3. Morbid obesity due to excess calories (HCC)  E66.01   4. Long term (current) use of anticoagulants  Z79.01     EKG 03/18/2019: Normal sinus rhythm at 65 bpm, normal axis, nonspecific T wave abnormality. Normal  QT interval.   Recommendations:   Patient is presently doing well without any complaints today.  She is maintaining sinus rhythm.  No chest pain or shortness of breath.  No evidence of decompensated heart failure.  She is on torsemide that she states helps with leg edema.  She is not been able to exercise, but has been working on her diet.  States her weight has been stable.  We will continue with current medications.  She reports she is scheduled to have labs with her PCP this month.  If she is unable to have these, I have encouraged her to contact me and we will place order for this.  INR is elevated today, but generally she is within range.  She does admit to not eating her normal amount of spinach this week.  She is not having any bleeding.  I will continue with her current dose and recheck in 10 days.  As she is been stable from a cardiac standpoint, I will see her back in 1 year or sooner if needed.   Miquel Dunn, MSN, APRN, FNP-C [facility]. [place] Office: [phone] Fax: [phone]

## 2019-03-31 ENCOUNTER — Ambulatory Visit (INDEPENDENT_AMBULATORY_CARE_PROVIDER_SITE_OTHER): Payer: Medicare Other | Admitting: Cardiology

## 2019-03-31 ENCOUNTER — Other Ambulatory Visit: Payer: Self-pay

## 2019-03-31 DIAGNOSIS — Z5181 Encounter for therapeutic drug level monitoring: Secondary | ICD-10-CM | POA: Diagnosis not present

## 2019-03-31 DIAGNOSIS — Z7901 Long term (current) use of anticoagulants: Secondary | ICD-10-CM

## 2019-03-31 DIAGNOSIS — I480 Paroxysmal atrial fibrillation: Secondary | ICD-10-CM

## 2019-03-31 LAB — POCT INR: INR: 3.1 — AB (ref 2.0–3.0)

## 2019-03-31 NOTE — Progress Notes (Signed)
Ref Range & Units 10:21 (03/31/19) 13d ago (03/18/19) 90mo ago (02/17/19) 5mo ago (02/03/19)  INR 2.0 - 3.0 3.1Abnormal   3.5Abnormal   2.7  4.1Abnormal         New maintenance plan:  2.5 mg every Tue, Thu; 5 mg all other days [] No changeStart Over  Maintenance plan weekly total: 30 mg7.7% Tablets on hand: 1 mg & 5 mg  Total dose from past 7 days: 32.5 mg    Changed to 2.5 T, Th 5 all other days return in 3 weeks

## 2019-04-02 ENCOUNTER — Ambulatory Visit: Payer: Medicare Other | Admitting: Cardiology

## 2019-04-23 ENCOUNTER — Ambulatory Visit (INDEPENDENT_AMBULATORY_CARE_PROVIDER_SITE_OTHER): Payer: Medicare Other | Admitting: Cardiology

## 2019-04-23 ENCOUNTER — Other Ambulatory Visit: Payer: Self-pay

## 2019-04-23 DIAGNOSIS — Z5181 Encounter for therapeutic drug level monitoring: Secondary | ICD-10-CM | POA: Diagnosis not present

## 2019-04-23 DIAGNOSIS — I480 Paroxysmal atrial fibrillation: Secondary | ICD-10-CM

## 2019-04-23 DIAGNOSIS — Z7901 Long term (current) use of anticoagulants: Secondary | ICD-10-CM

## 2019-04-23 LAB — POCT INR: INR: 4.3 — AB (ref 2.0–3.0)

## 2019-04-23 NOTE — Progress Notes (Signed)
Warfarin Anticoagulation Tracking    New maintenance plan:  5 mg every Mon, Wed, Fri; 2.5 mg all other days [] No changeStart Over  Maintenance plan weekly total: 25 mg16.7% Tablets on hand: 2.5 mg & 5 mg  Total dose from past 7 days: 30 mg    INR continues to be elevated despite recent changes. Will hold today and decrease weekly regimen down by 5 mg. Will recheck in 1 week.

## 2019-05-01 ENCOUNTER — Ambulatory Visit (INDEPENDENT_AMBULATORY_CARE_PROVIDER_SITE_OTHER): Payer: Medicare Other | Admitting: Cardiology

## 2019-05-01 ENCOUNTER — Other Ambulatory Visit: Payer: Self-pay

## 2019-05-01 DIAGNOSIS — Z7901 Long term (current) use of anticoagulants: Secondary | ICD-10-CM

## 2019-05-01 DIAGNOSIS — I480 Paroxysmal atrial fibrillation: Secondary | ICD-10-CM | POA: Diagnosis not present

## 2019-05-01 DIAGNOSIS — Z5181 Encounter for therapeutic drug level monitoring: Secondary | ICD-10-CM | POA: Diagnosis not present

## 2019-05-01 LAB — POCT INR: INR: 3.7 — AB (ref 2.0–3.0)

## 2019-05-02 NOTE — Progress Notes (Signed)
New maintenance plan:  5 mg every Tue, Thu; 2.5 mg all other days [] No changeStart Over  Maintenance plan weekly total: 22.5 mg10% Tablets on hand: 2.5 mg & 5 mg  Total dose from past 7 days: 25 mg     INR continues to be elevated despite recent changes. Will change to 2.5 mg all days, except for T and Th, will take 5 mg. Will repeat in 10 days.

## 2019-05-15 ENCOUNTER — Ambulatory Visit (INDEPENDENT_AMBULATORY_CARE_PROVIDER_SITE_OTHER): Payer: Medicare Other | Admitting: Cardiology

## 2019-05-15 ENCOUNTER — Other Ambulatory Visit: Payer: Self-pay

## 2019-05-15 DIAGNOSIS — Z5181 Encounter for therapeutic drug level monitoring: Secondary | ICD-10-CM | POA: Diagnosis not present

## 2019-05-15 DIAGNOSIS — Z7901 Long term (current) use of anticoagulants: Secondary | ICD-10-CM

## 2019-05-15 DIAGNOSIS — I480 Paroxysmal atrial fibrillation: Secondary | ICD-10-CM | POA: Diagnosis not present

## 2019-05-15 LAB — POCT INR: INR: 3.9 — AB (ref 2.0–3.0)

## 2019-05-16 NOTE — Progress Notes (Signed)
Ref Range & Units 1d ago 2wk ago 3wk ago 34mo ago  INR 2.0 - 3.0 3.9Abnormal   3.7Abnormal   4.3Abnormal   3.1Abnormal     New maintenance plan:  2.5 mg every day [] No changeStart Over  Maintenance plan weekly total: 17.5 mg22.2% Tablets on hand: 2.5 mg  Total dose from past 7 days: 22.5 mg    Pt dose changed to 2.5 daily return in 3 week for INR check/ pt was taking 5mg  daily and 2.5 all other days.

## 2019-05-29 ENCOUNTER — Other Ambulatory Visit: Payer: Self-pay

## 2019-05-29 MED ORDER — WARFARIN SODIUM 5 MG PO TABS: 5.0000 mg | ORAL_TABLET | ORAL | 3 refills | Status: DC

## 2019-06-09 ENCOUNTER — Ambulatory Visit (INDEPENDENT_AMBULATORY_CARE_PROVIDER_SITE_OTHER): Payer: Medicare Other | Admitting: Cardiology

## 2019-06-09 ENCOUNTER — Other Ambulatory Visit: Payer: Self-pay

## 2019-06-09 DIAGNOSIS — Z5181 Encounter for therapeutic drug level monitoring: Secondary | ICD-10-CM | POA: Diagnosis not present

## 2019-06-09 DIAGNOSIS — Z7901 Long term (current) use of anticoagulants: Secondary | ICD-10-CM

## 2019-06-09 DIAGNOSIS — I480 Paroxysmal atrial fibrillation: Secondary | ICD-10-CM | POA: Diagnosis not present

## 2019-06-09 LAB — POCT INR: INR: 4.6 — AB (ref 2.0–3.0)

## 2019-06-09 NOTE — Progress Notes (Signed)
Ref Range & Units 10:20 (06/09/19) 3wk ago (05/15/19) 46mo ago (05/01/19) 73mo ago (04/23/19)  INR 2.0 - 3.0 4.6Abnormal   3.9Abnormal   3.7Abnormal   4.3Abnormal        Specimen Collected: 06/09/19 10:20 Last Resulted: 06/09/19 10:20        Change of dose 2.5mg  every day and will return in 10 days. Hold Coumadin for 4 days. Goal INR 2-3 for A. Fib.  (Pt was still take 5 mg M,W, F and 7.5 all other days)   Ref Range & Units 10:20 (06/09/19) 3wk ago (05/15/19) 7mo ago (05/01/19) 89mo ago (04/23/19)  INR 2.0 - 3.0 4.6Abnormal   3.9Abnormal   3.7Abnormal   4.3Abnormal        Specimen Collected: 06/09/19 10:20 Last Resulted: 06/09/19 10:20        Change of dose 2.5mg  every day and will return in 10 days. Hold Coumadin for 4 days. Goal INR 2-3 for A. Fib.  (Pt was still take 5 mg M,W, F and 7.5 all other days)

## 2019-06-23 ENCOUNTER — Ambulatory Visit (INDEPENDENT_AMBULATORY_CARE_PROVIDER_SITE_OTHER): Payer: Medicare Other | Admitting: Cardiology

## 2019-06-23 ENCOUNTER — Other Ambulatory Visit: Payer: Self-pay

## 2019-06-23 DIAGNOSIS — I480 Paroxysmal atrial fibrillation: Secondary | ICD-10-CM | POA: Diagnosis not present

## 2019-06-23 DIAGNOSIS — Z5181 Encounter for therapeutic drug level monitoring: Secondary | ICD-10-CM | POA: Diagnosis not present

## 2019-06-23 DIAGNOSIS — Z7901 Long term (current) use of anticoagulants: Secondary | ICD-10-CM | POA: Diagnosis not present

## 2019-06-23 LAB — POCT INR: INR: 1.9 — AB (ref 2.0–3.0)

## 2019-06-23 NOTE — Progress Notes (Signed)
Ref Range & Units 09:58 (06/23/19) 2wk ago (06/09/19) 54mo ago (05/15/19) 32mo ago (05/01/19)  INR 2.0 - 3.0 1.9Abnormal   4.6Abnormal   3.9Abnormal   3.7Abnormal     Current maintenance plan:  2.5 mg every day [x] No changeStart Over  Maintenance plan weekly total: 17.5 mg Tablets on hand: 2.5 mg  Total dose from past 7 days: 17.5 mg    No change and will return in 10 days

## 2019-07-02 ENCOUNTER — Ambulatory Visit (INDEPENDENT_AMBULATORY_CARE_PROVIDER_SITE_OTHER): Payer: Medicare Other | Admitting: Cardiology

## 2019-07-02 ENCOUNTER — Other Ambulatory Visit: Payer: Self-pay

## 2019-07-02 DIAGNOSIS — I4821 Permanent atrial fibrillation: Secondary | ICD-10-CM | POA: Diagnosis not present

## 2019-07-02 DIAGNOSIS — Z5181 Encounter for therapeutic drug level monitoring: Secondary | ICD-10-CM

## 2019-07-02 DIAGNOSIS — Z7901 Long term (current) use of anticoagulants: Secondary | ICD-10-CM | POA: Diagnosis not present

## 2019-07-02 LAB — POCT INR: INR: 1.6 — AB (ref 2.0–3.0)

## 2019-07-22 ENCOUNTER — Ambulatory Visit (INDEPENDENT_AMBULATORY_CARE_PROVIDER_SITE_OTHER): Payer: Medicare Other | Admitting: Cardiology

## 2019-07-22 ENCOUNTER — Other Ambulatory Visit: Payer: Self-pay

## 2019-07-22 DIAGNOSIS — Z5181 Encounter for therapeutic drug level monitoring: Secondary | ICD-10-CM

## 2019-07-22 DIAGNOSIS — I4821 Permanent atrial fibrillation: Secondary | ICD-10-CM | POA: Diagnosis not present

## 2019-07-22 DIAGNOSIS — Z7901 Long term (current) use of anticoagulants: Secondary | ICD-10-CM | POA: Diagnosis not present

## 2019-07-22 LAB — POCT INR: INR: 2.1 (ref 2.0–3.0)

## 2019-07-22 NOTE — Progress Notes (Signed)
  Warfarin Anticoagulation Tracking    Current maintenance plan:  5 mg every Wed, Fri; 2.5 mg all other days [] No changeStart Over  Maintenance plan weekly total: 22.5 mg Tablets on hand: 2.5 mg & 5 mg  Total dose from past 7 days: 22.5 mg    INR within range of 2-3 for A fib. Will continue with current regimen and repeat in 1 month.

## 2019-08-22 ENCOUNTER — Other Ambulatory Visit: Payer: Self-pay

## 2019-08-22 ENCOUNTER — Ambulatory Visit (INDEPENDENT_AMBULATORY_CARE_PROVIDER_SITE_OTHER): Payer: Medicare Other | Admitting: Cardiology

## 2019-08-22 DIAGNOSIS — Z7901 Long term (current) use of anticoagulants: Secondary | ICD-10-CM

## 2019-08-22 DIAGNOSIS — Z5181 Encounter for therapeutic drug level monitoring: Secondary | ICD-10-CM | POA: Diagnosis not present

## 2019-08-22 DIAGNOSIS — I480 Paroxysmal atrial fibrillation: Secondary | ICD-10-CM

## 2019-08-22 LAB — POCT INR: INR: 2 (ref 2.0–3.0)

## 2019-08-23 NOTE — Progress Notes (Signed)
Ref Range & Units 1 d ago  (08/22/19) 1 mo ago  (07/22/19) 1 mo ago  (07/02/19) 2 mo ago  (06/23/19)  INR 2.0 - 3.0 2.0  2.1  1.6Abnormal   1.9Abnormal        Specimen Collected: 08/22/19 10:22 Last Resulted: 08/22/19 10:22       Current maintenance plan:  5 mg every Wed, Fri; 2.5 mg all other days [x] No changeStart Over  Maintenance plan weekly total: 22.5 mg Tablets on hand: 2.5 mg & 5 mg  Total dose from past 7 days: 22.5 mg    Continue same dose and recheck in 1 month

## 2019-08-23 NOTE — Patient Instructions (Signed)
Contineu same dose and recheck 1 month

## 2019-09-24 ENCOUNTER — Other Ambulatory Visit: Payer: Self-pay

## 2019-09-24 ENCOUNTER — Ambulatory Visit: Payer: Medicare Other | Admitting: Cardiology

## 2019-09-24 DIAGNOSIS — I4821 Permanent atrial fibrillation: Secondary | ICD-10-CM

## 2019-09-24 LAB — POCT INR: INR: 1.9 — AB (ref 2.0–3.0)

## 2019-09-26 NOTE — Progress Notes (Signed)
INR goal:  2.0-3.0  TTR:  43.0 % (11.6 mo)  INR used for dosing:  1.9Low  (09/24/2019)  Warfarin maintenance plan:  5 mg (5 mg x 1) every Wed, Fri; 2.5 mg (2.5 mg x 1) all other days  Weekly warfarin total:  22.5 mg  Plan last modified:  Laural Benes, Osvaldo Human (07/02/2019)  Next INR check:  10/22/2019   INR minimally low, generally patient is within range. Will continue with current regimen and repeat in 1 month.

## 2019-10-22 ENCOUNTER — Ambulatory Visit: Payer: Medicare Other | Admitting: Cardiology

## 2019-10-22 ENCOUNTER — Encounter: Payer: Self-pay | Admitting: Cardiology

## 2019-10-22 ENCOUNTER — Other Ambulatory Visit: Payer: Self-pay

## 2019-10-22 ENCOUNTER — Other Ambulatory Visit: Payer: Self-pay | Admitting: Adult Health

## 2019-10-22 DIAGNOSIS — I4821 Permanent atrial fibrillation: Secondary | ICD-10-CM

## 2019-10-22 DIAGNOSIS — Z1231 Encounter for screening mammogram for malignant neoplasm of breast: Secondary | ICD-10-CM

## 2019-10-22 LAB — POCT INR: INR: 1.6 — AB (ref 2.0–3.0)

## 2019-10-23 NOTE — Progress Notes (Signed)
  INR goal:  2.0-3.0  TTR:  39.8 % (1 y)  INR used for dosing:  1.6Low  (10/22/2019)  Warfarin maintenance plan:  5 mg (5 mg x 1) every Mon, Wed, Fri; 2.5 mg (2.5 mg x 1) all other days  Weekly warfarin total:  25 mg  Plan last modified:  Wysor, Tammy T, CMA (10/22/2019)  Next INR check:  11/05/2019    Ref Range & Units 1 d ago 4 wk ago 2 mo ago 3 mo ago  INR 2.0 - 3.0 1.6Abnormal   1.9Abnormal   2.0  2.1    New maintenance plan:  5 mg every Mon, Wed, Fri; 2.5 mg all other days [] No changeStart Over  Maintenance plan weekly total: 25 mg11.1% Tablets on hand: 2.5 mg & 5 mg  Total dose from past 7 days: 22.5 mg     Increased one 2.5 mg day to 5 mg and recheck in 2 weeks.    ICD-10-CM   1. Permanent atrial fibrillation (HCC)  I48.21 POCT INR

## 2019-11-04 ENCOUNTER — Ambulatory Visit: Payer: Medicare Other | Admitting: Cardiology

## 2019-11-04 ENCOUNTER — Other Ambulatory Visit: Payer: Self-pay

## 2019-11-04 DIAGNOSIS — I4821 Permanent atrial fibrillation: Secondary | ICD-10-CM

## 2019-11-04 LAB — POCT INR: INR: 2 (ref 2.0–3.0)

## 2019-11-04 NOTE — Patient Instructions (Signed)
INR at goal. Continue taking 5mg  on Mon, Wed, Fri and 2.5 all other days. Return 2 weeks.

## 2019-11-04 NOTE — Progress Notes (Signed)
Anticoagulation Management Zoe Cobb is a 64 y.o. female who reports to the clinic for monitoring of warfarin treatment.   Indication: atrial fibrillation CHA2DS2 Vasc Score 3 (Age 46-74, Female, CHF hx), HAS-BLED 1 (Age >97)  Duration: indefinite Supervising physician: Adrian Prows  Anticoagulation Clinic Visit History:  Patient does not report signs/symptoms of bleeding or thromboembolism  Other recent changes: No change in diet, medications, lifestyle. Pt currently having 2 servings of spinach or collard greens a day. Pt has been consistent on this diet and plans on continuing. Pt is wheelchair bound and has limited mobility.   Anticoagulation Episode Summary    Current INR goal:  2.0-3.0  TTR:  38.5 % (1.1 y)  Next INR check:  11/18/2019  INR from last check:  2.0 (11/04/2019)  Weekly max warfarin dose:    Target end date:    INR check location:    Preferred lab:    Send INR reminders to:     Indications   Atrial fibrillation ([place]) [I48.91]       Comments:          Allergies  Allergen Reactions  . Penicillins Swelling    Has patient had a PCN reaction causing immediate rash, facial/tongue/throat swelling, SOB or lightheadedness with hypotension: Yes Has patient had a PCN reaction causing severe rash involving mucus membranes or skin necrosis: No Has patient had a PCN reaction that required hospitalization No Has patient had a PCN reaction occurring within the last 10 years: No If all of the above answers are "NO", then may proceed with Cephalosporin use.     Current Outpatient Medications:  .  cholecalciferol (VITAMIN D) 1000 units tablet, Take 1,000 Units by mouth daily., Disp: , Rfl:  .  metoprolol succinate (TOPROL-XL) 50 MG 24 hr tablet, Take 1 tablet by mouth daily., Disp: , Rfl:  .  polyethylene glycol (MIRALAX / GLYCOLAX) packet, Take 17 g by mouth daily as needed for mild constipation., Disp: 14 each, Rfl: 0 .  torsemide (DEMADEX) 20 MG tablet, Take 3  tablets (60 mg total) by mouth daily., Disp: 90 tablet, Rfl: 0 .  vitamin B-12 (CYANOCOBALAMIN) 1000 MCG tablet, Take 1,000 mcg by mouth daily., Disp: , Rfl:  .  warfarin (COUMADIN) 5 MG tablet, Take 1 tablet (5 mg total) by mouth every Monday, Wednesday, Friday, Saturday, and Sunday at 6 PM. Take 1 1/2 (7.5 mg ) Tues and Thurs (Patient taking differently: Take 5 mg by mouth every Monday, Wednesday, Friday, Saturday, and Sunday at 6 PM. Take 5mg  on Mon, Wed, Fri and 2.5 all other days. Refer to most recent anticoagulation note), Disp: 90 tablet, Rfl: 3 Past Medical History:  Diagnosis Date  . CHF (congestive heart failure) ([place])   . Heart failure ([place])   . Hypertension   . Morbid obesity ([place])   . Paroxysmal atrial fibrillation (HCC)     ASSESSMENT  Recent Results: The most recent result is correlated with 25 mg per week:  Lab Results  Component Value Date   INR 2.0 11/04/2019   INR 1.6 (A) 10/22/2019   INR 1.9 (A) 09/24/2019    Anticoagulation Dosing: Description   INR at goal. Continue taking 5mg  on Mon,Wed,Fri and 2.5 all other days. Return 2 weeks.      INR today: Therapeutic. Warfarin dose increased last visit. Pt reports to be tolerating it well so far   PLAN Weekly dose was unchanged. Continue taking 5mg  on Mon,Wed,Fri and 2.5 all other days. Return 2  weeks.  Patient Instructions  INR at goal. Continue taking 5mg  on Mon, Wed, Fri and 2.5 all other days. Return 2 weeks.  Patient advised to contact clinic or seek medical attention if signs/symptoms of bleeding or thromboembolism occur.  Patient verbalized understanding by repeating back information and was advised to contact me if further medication-related questions arise.   Follow-up Return in about 2 weeks (around 11/18/2019).  11/20/2019, PharmD  15 minutes spent face-to-face with the patient during the encounter. 50% of time spent on education, including signs/sx bleeding and clotting, as well as food and  drug interactions with warfarin. 50% of time was spent on fingerprick POC INR sample collection,processing, results determination, and documentation

## 2019-11-18 ENCOUNTER — Ambulatory Visit: Payer: Medicare Other | Admitting: Cardiology

## 2019-11-18 ENCOUNTER — Other Ambulatory Visit: Payer: Self-pay

## 2019-11-18 DIAGNOSIS — I4821 Permanent atrial fibrillation: Secondary | ICD-10-CM

## 2019-11-18 LAB — POCT INR: INR: 1.8 — AB (ref 2.0–3.0)

## 2019-11-18 NOTE — Progress Notes (Signed)
Anticoagulation Management Zoe Cobb is a 64 y.o. female who reports to the clinic for monitoring of warfarin treatment.   Indication: atrial fibrillation CHA2DS2 Vasc Score 3 (Age 79-74, Female, CHF hx), HAS-BLED 1 (Age >34)  Duration: indefinite Supervising physician: Yates Decamp  Anticoagulation Clinic Visit History:  Patient does not report signs/symptoms of bleeding or thromboembolism  Other recent changes: No change in diet, medications, lifestyle. Pt reports having some coleslaw over the weekend that is unusual for her normal diet. Pt isnt planning on continuing this diet change moving forward.   Anticoagulation Episode Summary    Current INR goal:  2.0-3.0  TTR:  37.2 % (1.1 y)  Next INR check:  12/02/2019  INR from last check:  1.8 (11/18/2019)  Weekly max warfarin dose:    Target end date:    INR check location:    Preferred lab:    Send INR reminders to:     Indications   Atrial fibrillation (HCC) [I48.91]       Comments:          Allergies  Allergen Reactions  . Penicillins Swelling    Has patient had a PCN reaction causing immediate rash, facial/tongue/throat swelling, SOB or lightheadedness with hypotension: Yes Has patient had a PCN reaction causing severe rash involving mucus membranes or skin necrosis: No Has patient had a PCN reaction that required hospitalization No Has patient had a PCN reaction occurring within the last 10 years: No If all of the above answers are "NO", then may proceed with Cephalosporin use.     Current Outpatient Medications:  .  cholecalciferol (VITAMIN D) 1000 units tablet, Take 1,000 Units by mouth daily., Disp: , Rfl:  .  metoprolol succinate (TOPROL-XL) 50 MG 24 hr tablet, Take 1 tablet by mouth daily., Disp: , Rfl:  .  polyethylene glycol (MIRALAX / GLYCOLAX) packet, Take 17 g by mouth daily as needed for mild constipation., Disp: 14 each, Rfl: 0 .  torsemide (DEMADEX) 20 MG tablet, Take 3 tablets (60 mg total) by  mouth daily., Disp: 90 tablet, Rfl: 0 .  vitamin B-12 (CYANOCOBALAMIN) 1000 MCG tablet, Take 1,000 mcg by mouth daily., Disp: , Rfl:  .  warfarin (COUMADIN) 5 MG tablet, Take 1 tablet (5 mg total) by mouth every Monday, Wednesday, Friday, Saturday, and Sunday at 6 PM. Take 1 1/2 (7.5 mg ) Tues and Thurs (Patient taking differently: Take 5 mg by mouth every Monday, Wednesday, Friday, Saturday, and Sunday at 6 PM. Take 5mg  on Mon, Wed, Fri and 2.5 all other days. Refer to most recent anticoagulation note), Disp: 90 tablet, Rfl: 3 Past Medical History:  Diagnosis Date  . CHF (congestive heart failure) (HCC)   . Heart failure (HCC)   . Hypertension   . Morbid obesity (HCC)   . Paroxysmal atrial fibrillation (HCC)     ASSESSMENT  Recent Results: The most recent result is correlated with 25 mg per week:  Lab Results  Component Value Date   INR 1.8 (A) 11/18/2019   INR 2.0 11/04/2019   INR 1.6 (A) 10/22/2019    Anticoagulation Dosing: Description   INR below goal. Increase dose to 2.5 mg every Sun, Thur, Sat and 5 mg all other days. Recheck INR in 2 weeks.      INR today: Subtherapeutic. Pt has been previously running on the lower end of range or below. Would benefit for further dose increase to ensure pt remains within therapeutic range.   PLAN Weekly dose was  increased by 10%. New dose of 2.5 mg every Sun, Thur, Sat and 5 mg all other days. Recheck INR in 2 weeks.  Patient Instructions  INR below goal. Increase dose to 2.5 mg every Sun, Thur, Sat and 5 mg all other days. Recheck INR in 2 weeks.  Patient advised to contact clinic or seek medical attention if signs/symptoms of bleeding or thromboembolism occur.  Patient verbalized understanding by repeating back information and was advised to contact me if further medication-related questions arise.   Follow-up Return in about 2 weeks (around 12/02/2019).  Alysia Penna, PharmD  15 minutes spent face-to-face with the patient  during the encounter. 50% of time spent on education, including signs/sx bleeding and clotting, as well as food and drug interactions with warfarin. 50% of time was spent on fingerprick POC INR sample collection,processing, results determination, and documentation

## 2019-11-18 NOTE — Patient Instructions (Signed)
INR below goal. Increase dose to 2.5 mg every Sun, Thur, Sat and 5 mg all other days. Recheck INR in 2 weeks.

## 2019-12-02 ENCOUNTER — Ambulatory Visit: Payer: Medicare Other | Admitting: Pharmacist

## 2019-12-02 ENCOUNTER — Other Ambulatory Visit: Payer: Self-pay

## 2019-12-02 DIAGNOSIS — I4821 Permanent atrial fibrillation: Secondary | ICD-10-CM

## 2019-12-02 LAB — POCT INR: INR: 2 (ref 2.0–3.0)

## 2019-12-02 NOTE — Progress Notes (Signed)
Anticoagulation Management Zoe Cobb is a 64 y.o. female who reports to the clinic for monitoring of warfarin treatment.   Indication: atrial fibrillation CHA2DS2 Vasc Score 3 (Age 1-74, Female, CHF hx), HAS-BLED 1 (Age >69)  Duration: indefinite Supervising physician: Yates Decamp  Anticoagulation Clinic Visit History:  Patient does not report signs/symptoms of bleeding or thromboembolism  Other recent changes: No change in diet, medications, lifestyle. Pt's current Vit K intake consists of spinach and collard greens once daily, broccoli 2x week, lettuce once or twice a week with sandwiches, but recently switched to baby spinach instead of lettuce. Pt planning on staying consistent with her VItK intake moving forward.    Anticoagulation Episode Summary    Current INR goal:  2.0-3.0  TTR:  35.9 % (1.1 y)  Next INR check:  12/16/2019  INR from last check:  2.0 (12/02/2019)  Weekly max warfarin dose:    Target end date:    INR check location:    Preferred lab:    Send INR reminders to:     Indications   Atrial fibrillation (HCC) [I48.91]       Comments:          Allergies  Allergen Reactions  . Penicillins Swelling    Has patient had a PCN reaction causing immediate rash, facial/tongue/throat swelling, SOB or lightheadedness with hypotension: Yes Has patient had a PCN reaction causing severe rash involving mucus membranes or skin necrosis: No Has patient had a PCN reaction that required hospitalization No Has patient had a PCN reaction occurring within the last 10 years: No If all of the above answers are "NO", then may proceed with Cephalosporin use.     Current Outpatient Medications:  .  cholecalciferol (VITAMIN D) 1000 units tablet, Take 1,000 Units by mouth daily., Disp: , Rfl:  .  metoprolol succinate (TOPROL-XL) 50 MG 24 hr tablet, Take 1 tablet by mouth daily., Disp: , Rfl:  .  polyethylene glycol (MIRALAX / GLYCOLAX) packet, Take 17 g by mouth daily as  needed for mild constipation., Disp: 14 each, Rfl: 0 .  torsemide (DEMADEX) 20 MG tablet, Take 3 tablets (60 mg total) by mouth daily., Disp: 90 tablet, Rfl: 0 .  vitamin B-12 (CYANOCOBALAMIN) 1000 MCG tablet, Take 1,000 mcg by mouth daily., Disp: , Rfl:  .  warfarin (COUMADIN) 5 MG tablet, Take 1 tablet (5 mg total) by mouth every Monday, Wednesday, Friday, Saturday, and Sunday at 6 PM. Take 1 1/2 (7.5 mg ) Tues and Thurs (Patient taking differently: Take 5 mg by mouth every Monday, Wednesday, Friday, Saturday, and Sunday at 6 PM. Take 2.5 5mg  on Sun, Thur, Sat and 5 mg all other days. Refer to most recent anticoagulation note), Disp: 90 tablet, Rfl: 3 Past Medical History:  Diagnosis Date  . CHF (congestive heart failure) (HCC)   . Heart failure (HCC)   . Hypertension   . Morbid obesity (HCC)   . Paroxysmal atrial fibrillation (HCC)     ASSESSMENT  Recent Results: The most recent result is correlated with 27.5 mg per week:  Lab Results  Component Value Date   INR 2.0 12/02/2019   INR 1.8 (A) 11/18/2019   INR 2.0 11/04/2019    Anticoagulation Dosing: Description   INR at goal. Increase dose to 2.5 mg every Thur, Sat and 5 mg all other days. Recheck INR in 2 weeks.      INR today: Therapeutic. Pt has been previously running on the lower end of range or  below. Recent changes in Vit K intake, but pt reports that she has been consistent for the past month. Would like to further increase her intake of avocado and baby spinach moving forward. Would benefit weekly dose increase to account for increased VitK intake.   PLAN Weekly dose was increased by 9.1%. New dose of 2.5 mg every Thur, Sat and 5 mg all other days. Recheck INR in 2 weeks.  Patient Instructions  INR at goal. Increase dose to 2.5 mg every Thur, Sat and 5 mg all other days. Recheck INR in 2 weeks.  Patient advised to contact clinic or seek medical attention if signs/symptoms of bleeding or thromboembolism  occur.  Patient verbalized understanding by repeating back information and was advised to contact me if further medication-related questions arise.   Follow-up Return in about 2 weeks (around 12/16/2019).  Alysia Penna, PharmD  15 minutes spent face-to-face with the patient during the encounter. 50% of time spent on education, including signs/sx bleeding and clotting, as well as food and drug interactions with warfarin. 50% of time was spent on fingerprick POC INR sample collection,processing, results determination, and documentation

## 2019-12-02 NOTE — Patient Instructions (Signed)
INR at goal. Increase dose to 2.5 mg every Thur, Sat and 5 mg all other days. Recheck INR in 2 weeks.

## 2019-12-16 ENCOUNTER — Other Ambulatory Visit: Payer: Self-pay

## 2019-12-16 ENCOUNTER — Ambulatory Visit: Payer: Medicare Other | Admitting: Pharmacist

## 2019-12-16 DIAGNOSIS — I4821 Permanent atrial fibrillation: Secondary | ICD-10-CM

## 2019-12-16 LAB — POCT INR: INR: 2.3 (ref 2.0–3.0)

## 2019-12-16 NOTE — Progress Notes (Signed)
Anticoagulation Management Zoe Cobb is a 64 y.o. female who reports to the clinic for monitoring of warfarin treatment.   Indication: atrial fibrillation CHA2DS2 Vasc Score 3 (Age 89-74, Female, CHF hx), HAS-BLED 1 (Age >74)  Duration: indefinite Supervising physician: Yates Decamp  Anticoagulation Clinic Visit History:  Patient does not report signs/symptoms of bleeding or thromboembolism  Other recent changes: No change in diet, medications, lifestyle. Pt's current Vit K intake consists of spinach and collard greens once daily, broccoli 2x week, lettuce once or twice a week with sandwiches, but recently switched to baby spinach instead of lettuce. Pt planning on staying consistent with her VItK intake moving forward.    Anticoagulation Episode Summary    Current INR goal:  2.0-3.0  TTR:  38.0 % (1.2 y)  Next INR check:  01/06/2020  INR from last check:  2.3 (12/16/2019)  Weekly max warfarin dose:    Target end date:    INR check location:    Preferred lab:    Send INR reminders to:     Indications   Atrial fibrillation (HCC) [I48.91]       Comments:          Allergies  Allergen Reactions  . Penicillins Swelling    Has patient had a PCN reaction causing immediate rash, facial/tongue/throat swelling, SOB or lightheadedness with hypotension: Yes Has patient had a PCN reaction causing severe rash involving mucus membranes or skin necrosis: No Has patient had a PCN reaction that required hospitalization No Has patient had a PCN reaction occurring within the last 10 years: No If all of the above answers are "NO", then may proceed with Cephalosporin use.     Current Outpatient Medications:  .  cholecalciferol (VITAMIN D) 1000 units tablet, Take 1,000 Units by mouth daily., Disp: , Rfl:  .  metoprolol succinate (TOPROL-XL) 50 MG 24 hr tablet, Take 1 tablet by mouth daily., Disp: , Rfl:  .  polyethylene glycol (MIRALAX / GLYCOLAX) packet, Take 17 g by mouth daily as needed  for mild constipation., Disp: 14 each, Rfl: 0 .  torsemide (DEMADEX) 20 MG tablet, Take 3 tablets (60 mg total) by mouth daily., Disp: 90 tablet, Rfl: 0 .  vitamin B-12 (CYANOCOBALAMIN) 1000 MCG tablet, Take 1,000 mcg by mouth daily., Disp: , Rfl:  .  warfarin (COUMADIN) 5 MG tablet, Take 1 tablet (5 mg total) by mouth every Monday, Wednesday, Friday, Saturday, and Sunday at 6 PM. Take 1 1/2 (7.5 mg ) Tues and Thurs (Patient taking differently: Take 5 mg by mouth daily at 4 PM. Take 2.5 5mg  on Thur, Sat and 5 mg all other days. Refer to most recent anticoagulation note), Disp: 90 tablet, Rfl: 3 Past Medical History:  Diagnosis Date  . CHF (congestive heart failure) (HCC)   . Heart failure (HCC)   . Hypertension   . Morbid obesity (HCC)   . Paroxysmal atrial fibrillation (HCC)     ASSESSMENT  Recent Results: The most recent result is correlated with 30 mg per week:  Lab Results  Component Value Date   INR 2.3 12/16/2019   INR 2.0 12/02/2019   INR 1.8 (A) 11/18/2019    Anticoagulation Dosing: Description   INR at goal. Continue to take 2.5 mg every Thur, Sat and 5 mg all other days. Recheck INR in 3 weeks.      INR today: Therapeutic. Pt tolerating the recent dose increase. INR trended up slightly. Pt reports to staying consisent with her Vitk intake  as discussed previously.   PLAN Weekly dose was unchanged. Continue taking 2.5 mg every Thur, Sat and 5 mg all other days. Recheck INR in 3 weeks.  Patient Instructions  INR at goal. Continue to take 2.5 mg every Thur, Sat and 5 mg all other days. Recheck INR in 3 weeks.  Patient advised to contact clinic or seek medical attention if signs/symptoms of bleeding or thromboembolism occur.  Patient verbalized understanding by repeating back information and was advised to contact me if further medication-related questions arise.   Follow-up Return in about 3 weeks (around 01/06/2020).  Alysia Penna, PharmD  15 minutes spent  face-to-face with the patient during the encounter. 50% of time spent on education, including signs/sx bleeding and clotting, as well as food and drug interactions with warfarin. 50% of time was spent on fingerprick POC INR sample collection,processing, results determination, and documentation

## 2019-12-16 NOTE — Patient Instructions (Signed)
INR at goal. Continue to take 2.5 mg every Thur, Sat and 5 mg all other days. Recheck INR in 3 weeks.

## 2020-01-06 ENCOUNTER — Ambulatory Visit: Payer: Medicare Other | Admitting: Pharmacist

## 2020-01-06 ENCOUNTER — Other Ambulatory Visit: Payer: Self-pay

## 2020-01-06 DIAGNOSIS — Z7901 Long term (current) use of anticoagulants: Secondary | ICD-10-CM

## 2020-01-06 DIAGNOSIS — Z5181 Encounter for therapeutic drug level monitoring: Secondary | ICD-10-CM

## 2020-01-06 DIAGNOSIS — I4821 Permanent atrial fibrillation: Secondary | ICD-10-CM

## 2020-01-06 LAB — POCT INR: INR: 2.1 (ref 2.0–3.0)

## 2020-01-06 NOTE — Patient Instructions (Signed)
INR at goal. Continue to take 2.5 mg every Thur, Sat and 5 mg all other days. Recheck INR in 4 weeks.

## 2020-01-06 NOTE — Progress Notes (Signed)
Anticoagulation Management Zoe Cobb is a 64 y.o. female who reports to the clinic for monitoring of warfarin treatment.   Indication: atrial fibrillation CHA2DS2 Vasc Score 3 (Age 49-74, Female, CHF hx), HAS-BLED 1 (Age >68)  Duration: indefinite Supervising physician: Adrian Prows  Anticoagulation Clinic Visit History:  Patient does not report signs/symptoms of bleeding or thromboembolism  Other recent changes: No change in diet, medications, lifestyle. Pt's current Vit K intake consists of spinach and collard greens once daily, broccoli 2x week, lettuce once or twice a week with sandwiches, but recently switched to baby spinach instead of lettuce. Pt planning on staying consistent with her VItK intake moving forward.    Anticoagulation Episode Summary    Current INR goal:  2.0-3.0  TTR:  40.9 % (1.2 y)  Next INR check:  02/03/2020  INR from last check:  2.1 (01/06/2020)  Weekly max warfarin dose:    Target end date:    INR check location:    Preferred lab:    Send INR reminders to:     Indications   Atrial fibrillation ([name]) [I48.91]       Comments:          Allergies  Allergen Reactions  . Penicillins Swelling    Has patient had a PCN reaction causing immediate rash, facial/tongue/throat swelling, SOB or lightheadedness with hypotension: Yes Has patient had a PCN reaction causing severe rash involving mucus membranes or skin necrosis: No Has patient had a PCN reaction that required hospitalization No Has patient had a PCN reaction occurring within the last 10 years: No If all of the above answers are "NO", then may proceed with Cephalosporin use.     Current Outpatient Medications:  .  cholecalciferol (VITAMIN D) 1000 units tablet, Take 1,000 Units by mouth daily., Disp: , Rfl:  .  metoprolol succinate (TOPROL-XL) 50 MG 24 hr tablet, Take 1 tablet by mouth daily., Disp: , Rfl:  .  polyethylene glycol (MIRALAX / GLYCOLAX) packet, Take 17 g by mouth daily as needed  for mild constipation., Disp: 14 each, Rfl: 0 .  torsemide (DEMADEX) 20 MG tablet, Take 3 tablets (60 mg total) by mouth daily., Disp: 90 tablet, Rfl: 0 .  vitamin B-12 (CYANOCOBALAMIN) 1000 MCG tablet, Take 1,000 mcg by mouth daily., Disp: , Rfl:  .  warfarin (COUMADIN) 5 MG tablet, Take 1 tablet (5 mg total) by mouth every Monday, Wednesday, Friday, Saturday, and Sunday at 6 PM. Take 1 1/2 (7.5 mg ) Tues and Thurs (Patient taking differently: Take 5 mg by mouth daily at 4 PM. Take 2.5 5mg  on Thur, Sat and 5 mg all other days. Refer to most recent anticoagulation note), Disp: 90 tablet, Rfl: 3 Past Medical History:  Diagnosis Date  . CHF (congestive heart failure) ([place])   . Heart failure ([place])   . Hypertension   . Morbid obesity ([place])   . Paroxysmal atrial fibrillation (HCC)     ASSESSMENT  Recent Results: The most recent result is correlated with 30 mg per week:  Lab Results  Component Value Date   INR 2.1 01/06/2020   INR 2.3 12/16/2019   INR 2.0 12/02/2019    Anticoagulation Dosing: Description   INR at goal. Continue to take 2.5 mg every Thur, Sat and 5 mg all other days. Recheck INR in 4 weeks.      INR today: Therapeutic. Pt reports to staying consisent with her Vitk intake as discussed previously. Denies any other changes in her diet, lifestyle,  or medications. Denies any c/o of bleeding or thromboembolic events.   PLAN Weekly dose was unchanged. Continue taking 2.5 mg every Thur, Sat and 5 mg all other days. Recheck INR in 4 weeks.  Patient Instructions  INR at goal. Continue to take 2.5 mg every Thur, Sat and 5 mg all other days. Recheck INR in 4 weeks.  Patient advised to contact clinic or seek medical attention if signs/symptoms of bleeding or thromboembolism occur.  Patient verbalized understanding by repeating back information and was advised to contact me if further medication-related questions arise.   Follow-up Return in about 4 weeks (around  02/03/2020).  Leonides Schanz, PharmD  15 minutes spent face-to-face with the patient during the encounter. 50% of time spent on education, including signs/sx bleeding and clotting, as well as food and drug interactions with warfarin. 50% of time was spent on fingerprick POC INR sample collection,processing, results determination, and documentation

## 2020-02-03 ENCOUNTER — Ambulatory Visit: Payer: Medicare Other | Admitting: Pharmacist

## 2020-02-03 ENCOUNTER — Other Ambulatory Visit: Payer: Self-pay

## 2020-02-03 DIAGNOSIS — I4891 Unspecified atrial fibrillation: Secondary | ICD-10-CM

## 2020-02-03 LAB — POCT INR: INR: 2.2 (ref 2.0–3.0)

## 2020-02-03 NOTE — Progress Notes (Deleted)
Anticoagulation Management Zoe Cobb is a 64 y.o. female who reports to the clinic for monitoring of warfarin treatment.   Indication: atrial fibrillation CHA2DS2 Vasc Score 3 (Age 49-74, Female, CHF hx), HAS-BLED 1 (Age >68)  Duration: indefinite Supervising physician: Adrian Prows  Anticoagulation Clinic Visit History:  Patient does not report signs/symptoms of bleeding or thromboembolism  Other recent changes: No change in diet, medications, lifestyle. Pt's current Vit K intake consists of spinach and collard greens once daily, broccoli 2x week, lettuce once or twice a week with sandwiches, but recently switched to baby spinach instead of lettuce. Pt planning on staying consistent with her VItK intake moving forward.    Anticoagulation Episode Summary    Current INR goal:  2.0-3.0  TTR:  40.9 % (1.2 y)  Next INR check:  02/03/2020  INR from last check:  2.1 (01/06/2020)  Weekly max warfarin dose:    Target end date:    INR check location:    Preferred lab:    Send INR reminders to:     Indications   Atrial fibrillation ([name]) [I48.91]       Comments:          Allergies  Allergen Reactions  . Penicillins Swelling    Has patient had a PCN reaction causing immediate rash, facial/tongue/throat swelling, SOB or lightheadedness with hypotension: Yes Has patient had a PCN reaction causing severe rash involving mucus membranes or skin necrosis: No Has patient had a PCN reaction that required hospitalization No Has patient had a PCN reaction occurring within the last 10 years: No If all of the above answers are "NO", then may proceed with Cephalosporin use.     Current Outpatient Medications:  .  cholecalciferol (VITAMIN D) 1000 units tablet, Take 1,000 Units by mouth daily., Disp: , Rfl:  .  metoprolol succinate (TOPROL-XL) 50 MG 24 hr tablet, Take 1 tablet by mouth daily., Disp: , Rfl:  .  polyethylene glycol (MIRALAX / GLYCOLAX) packet, Take 17 g by mouth daily as needed  for mild constipation., Disp: 14 each, Rfl: 0 .  torsemide (DEMADEX) 20 MG tablet, Take 3 tablets (60 mg total) by mouth daily., Disp: 90 tablet, Rfl: 0 .  vitamin B-12 (CYANOCOBALAMIN) 1000 MCG tablet, Take 1,000 mcg by mouth daily., Disp: , Rfl:  .  warfarin (COUMADIN) 5 MG tablet, Take 1 tablet (5 mg total) by mouth every Monday, Wednesday, Friday, Saturday, and Sunday at 6 PM. Take 1 1/2 (7.5 mg ) Tues and Thurs (Patient taking differently: Take 5 mg by mouth daily at 4 PM. Take 2.5 5mg  on Thur, Sat and 5 mg all other days. Refer to most recent anticoagulation note), Disp: 90 tablet, Rfl: 3 Past Medical History:  Diagnosis Date  . CHF (congestive heart failure) ([place])   . Heart failure ([place])   . Hypertension   . Morbid obesity ([place])   . Paroxysmal atrial fibrillation (HCC)     ASSESSMENT  Recent Results: The most recent result is correlated with 30 mg per week:  Lab Results  Component Value Date   INR 2.1 01/06/2020   INR 2.3 12/16/2019   INR 2.0 12/02/2019    Anticoagulation Dosing: Description   INR at goal. Continue to take 2.5 mg every Thur, Sat and 5 mg all other days. Recheck INR in 4 weeks.      INR today: Therapeutic. Pt reports to staying consisent with her Vitk intake as discussed previously. Denies any other changes in her diet, lifestyle,  or medications. Denies any c/o of bleeding or thromboembolic events.   PLAN Weekly dose was unchanged. Continue taking 2.5 mg every Thur, Sat and 5 mg all other days. Recheck INR in 4 weeks.  There are no Patient Instructions on file for this visit. Patient advised to contact clinic or seek medical attention if signs/symptoms of bleeding or thromboembolism occur.  Patient verbalized understanding by repeating back information and was advised to contact me if further medication-related questions arise.   Follow-up No follow-ups on file.  Leonides Schanz, PharmD  15 minutes spent face-to-face with the patient during the  encounter. 50% of time spent on education, including signs/sx bleeding and clotting, as well as food and drug interactions with warfarin. 50% of time was spent on fingerprick POC INR sample collection,processing, results determination, and documentation

## 2020-02-03 NOTE — Progress Notes (Signed)
Anticoagulation Management Zoe Cobb is a 64 y.o. female who reports to the clinic for monitoring of warfarin treatment.   Indication: atrial fibrillation CHA2DS2 Vasc Score 3 (Age 47-74, Female, CHF hx), HAS-BLED 1 (Age >57)  Duration: indefinite Supervising physician: Yates Decamp  Anticoagulation Clinic Visit History:  Patient does not report signs/symptoms of bleeding or thromboembolism  Other recent changes: No change in diet, medications, lifestyle. Pt's current Vit K intake consists of spinach and collard greens once daily, broccoli 2x week, lettuce once or twice a week with sandwiches.    Anticoagulation Episode Summary    Current INR goal:  2.0-3.0  TTR:  44.3 % (1.3 y)  Next INR check:  03/17/2020  INR from last check:  2.2 (02/03/2020)  Weekly max warfarin dose:    Target end date:    INR check location:    Preferred lab:    Send INR reminders to:     Indications   Atrial fibrillation (HCC) [I48.91]       Comments:          Allergies  Allergen Reactions  . Penicillins Swelling    Has patient had a PCN reaction causing immediate rash, facial/tongue/throat swelling, SOB or lightheadedness with hypotension: Yes Has patient had a PCN reaction causing severe rash involving mucus membranes or skin necrosis: No Has patient had a PCN reaction that required hospitalization No Has patient had a PCN reaction occurring within the last 10 years: No If all of the above answers are "NO", then may proceed with Cephalosporin use.     Current Outpatient Medications:  .  cholecalciferol (VITAMIN D) 1000 units tablet, Take 1,000 Units by mouth daily., Disp: , Rfl:  .  metoprolol succinate (TOPROL-XL) 50 MG 24 hr tablet, Take 1 tablet by mouth daily., Disp: , Rfl:  .  polyethylene glycol (MIRALAX / GLYCOLAX) packet, Take 17 g by mouth daily as needed for mild constipation., Disp: 14 each, Rfl: 0 .  torsemide (DEMADEX) 20 MG tablet, Take 3 tablets (60 mg total) by mouth  daily., Disp: 90 tablet, Rfl: 0 .  vitamin B-12 (CYANOCOBALAMIN) 1000 MCG tablet, Take 1,000 mcg by mouth daily., Disp: , Rfl:  .  warfarin (COUMADIN) 5 MG tablet, Take 1 tablet (5 mg total) by mouth every Monday, Wednesday, Friday, Saturday, and Sunday at 6 PM. Take 1 1/2 (7.5 mg ) Tues and Thurs (Patient taking differently: Take 5 mg by mouth daily at 4 PM. Take 2.5 5mg  on Thur, Sat and 5 mg all other days. Refer to most recent anticoagulation note), Disp: 90 tablet, Rfl: 3 Past Medical History:  Diagnosis Date  . CHF (congestive heart failure) (HCC)   . Heart failure (HCC)   . Hypertension   . Morbid obesity (HCC)   . Paroxysmal atrial fibrillation (HCC)     ASSESSMENT  Recent Results: The most recent result is correlated with 30 mg per week:  Lab Results  Component Value Date   INR 2.2 02/03/2020   INR 2.1 01/06/2020   INR 2.3 12/16/2019    Anticoagulation Dosing: Description   INR at goal. Continue to take 2.5 mg every Thur, Sat and 5 mg all other days. Recheck INR in 6 weeks.      INR today: Therapeutic. Pt reports to staying consisent with her Vitk intake as discussed previously. Denies any other changes in her diet, lifestyle, or medications. Denies any c/o of bleeding or thromboembolic events.   PLAN Weekly dose was unchanged. Continue taking 2.5  mg every Thur, Sat and 5 mg all other days. Recheck INR in 6 weeks w/ OV.   Patient Instructions  INR at goal. Continue to take 2.5 mg every Thur, Sat and 5 mg all other days. Recheck INR in 6 weeks.  Patient advised to contact clinic or seek medical attention if signs/symptoms of bleeding or thromboembolism occur.  Patient verbalized understanding by repeating back information and was advised to contact me if further medication-related questions arise.   Follow-up Return in about 6 weeks (around 03/17/2020).  Leonides Schanz, PharmD  15 minutes spent face-to-face with the patient during the encounter. 50% of time spent  on education, including signs/sx bleeding and clotting, as well as food and drug interactions with warfarin. 50% of time was spent on fingerprick POC INR sample collection,processing, results determination, and documentation

## 2020-02-03 NOTE — Patient Instructions (Addendum)
INR at goal. Continue to take 2.5 mg every Thur, Sat and 5 mg all other days. Recheck INR in 6 weeks.

## 2020-03-17 ENCOUNTER — Ambulatory Visit: Payer: Medicare Other | Admitting: Pharmacist

## 2020-03-17 ENCOUNTER — Ambulatory Visit: Payer: Medicare Other | Admitting: Cardiology

## 2020-03-17 ENCOUNTER — Other Ambulatory Visit: Payer: Self-pay

## 2020-03-17 DIAGNOSIS — Z7901 Long term (current) use of anticoagulants: Secondary | ICD-10-CM

## 2020-03-17 DIAGNOSIS — I4891 Unspecified atrial fibrillation: Secondary | ICD-10-CM

## 2020-03-17 DIAGNOSIS — Z5181 Encounter for therapeutic drug level monitoring: Secondary | ICD-10-CM | POA: Insufficient documentation

## 2020-03-17 LAB — POCT INR: INR: 3.2 — AB (ref 2.0–3.0)

## 2020-03-17 NOTE — Progress Notes (Signed)
Anticoagulation Management Zoe Cobb is a 64 y.o. female who reports to the clinic for monitoring of warfarin treatment.   Indication: atrial fibrillation CHA2DS2 Vasc Score 3 (Age 35-74, Female, CHF hx), HAS-BLED 1 (Age >41)  Duration: indefinite Supervising physician: Yates Decamp  Anticoagulation Clinic Visit History:  Patient does not report signs/symptoms of bleeding or thromboembolism  Other recent changes: No change in diet, medications, lifestyle. Pt's current Vit K intake consists of spinach and collard greens once daily, broccoli 2x week, lettuce once or twice a week with sandwiches. Pt recently had a dentist appt last week and was told to start clindamycin 300 mg Q8hrs for 7 days for a teeth abscess. Pt has a f/u dentist OV for a single teeth removal on 04/21/20.  Anticoagulation Episode Summary    Current INR goal:  2.0-3.0  TTR:  47.3 % (1.4 y)  Next INR check:  04/14/2020  INR from last check:  3.2 (03/17/2020)  Weekly max warfarin dose:    Target end date:    INR check location:    Preferred lab:    Send INR reminders to:     Indications   Atrial fibrillation (HCC) [I48.91] Monitoring for long-term anticoagulant use [Z51.81 Z79.01]       Comments:          Allergies  Allergen Reactions  . Penicillins Swelling    Has patient had a PCN reaction causing immediate rash, facial/tongue/throat swelling, SOB or lightheadedness with hypotension: Yes Has patient had a PCN reaction causing severe rash involving mucus membranes or skin necrosis: No Has patient had a PCN reaction that required hospitalization No Has patient had a PCN reaction occurring within the last 10 years: No If all of the above answers are "NO", then may proceed with Cephalosporin use.     Current Outpatient Medications:  .  cholecalciferol (VITAMIN D) 1000 units tablet, Take 1,000 Units by mouth daily., Disp: , Rfl:  .  metoprolol succinate (TOPROL-XL) 50 MG 24 hr tablet, Take 1 tablet by  mouth daily., Disp: , Rfl:  .  polyethylene glycol (MIRALAX / GLYCOLAX) packet, Take 17 g by mouth daily as needed for mild constipation., Disp: 14 each, Rfl: 0 .  torsemide (DEMADEX) 20 MG tablet, Take 3 tablets (60 mg total) by mouth daily., Disp: 90 tablet, Rfl: 0 .  vitamin B-12 (CYANOCOBALAMIN) 1000 MCG tablet, Take 1,000 mcg by mouth daily., Disp: , Rfl:  .  warfarin (COUMADIN) 5 MG tablet, Take 1 tablet (5 mg total) by mouth every Monday, Wednesday, Friday, Saturday, and Sunday at 6 PM. Take 1 1/2 (7.5 mg ) Tues and Thurs (Patient taking differently: Take 5 mg by mouth daily at 4 PM. Take 2.5 5mg  on Thur, Sat and 5 mg all other days. Refer to most recent anticoagulation note), Disp: 90 tablet, Rfl: 3 Past Medical History:  Diagnosis Date  . CHF (congestive heart failure) (HCC)   . Heart failure (HCC)   . Hypertension   . Morbid obesity (HCC)   . Paroxysmal atrial fibrillation (HCC)     ASSESSMENT  Recent Results: The most recent result is correlated with 30 mg per week:  Lab Results  Component Value Date   INR 3.2 (A) 03/17/2020   INR 2.2 02/03/2020   INR 2.1 01/06/2020    Anticoagulation Dosing: Description   INR above goal. Take 2.5 mg today and then continue to take 2.5 mg every Thur, Sat and 5 mg all other days. Recheck INR in 3  weeks.      INR today: Supratherapeutic likely in the setting of recent clindamycin start, Pt has been previously stable. Clindamycin end date of 03/19/20. Denies any complains of bleeding or bruising symptoms. Denies any other relevant changes in her diet, medications, or lifestyle. Will dose adjust and continue close follow up  PLAN Weekly dose was unchanged. Take 2.5 mg today and then continue taking 2.5 mg every Thur, Sat and 5 mg all other days. Recheck INR in 6 weeks w/ OV.   Patient Instructions  INR above goal. Take 2.5 mg today and then continue to take 2.5 mg every Thur, Sat and 5 mg all other days. Recheck INR in 3 weeks.  Patient  advised to contact clinic or seek medical attention if signs/symptoms of bleeding or thromboembolism occur.  Patient verbalized understanding by repeating back information and was advised to contact me if further medication-related questions arise.   Follow-up Return in about 19 days (around 04/05/2020).  Leonides Schanz, PharmD  15 minutes spent face-to-face with the patient during the encounter. 50% of time spent on education, including signs/sx bleeding and clotting, as well as food and drug interactions with warfarin. 50% of time was spent on fingerprick POC INR sample collection,processing, results determination, and documentation

## 2020-03-17 NOTE — Patient Instructions (Signed)
INR above goal. Take 2.5 mg today and then continue to take 2.5 mg every Thur, Sat and 5 mg all other days. Recheck INR in 3 weeks.

## 2020-03-26 ENCOUNTER — Telehealth: Payer: Self-pay | Admitting: Cardiology

## 2020-03-26 ENCOUNTER — Encounter: Payer: Self-pay | Admitting: Cardiology

## 2020-04-02 NOTE — Telephone Encounter (Signed)
I spoke with the patient. Patient was unable to discuss details over the phone but would like to know instructions during OV 8/30. Procedure is scheduled in December.

## 2020-04-05 ENCOUNTER — Ambulatory Visit: Payer: Medicare Other | Admitting: Cardiology

## 2020-04-05 ENCOUNTER — Encounter: Payer: Self-pay | Admitting: Cardiology

## 2020-04-05 ENCOUNTER — Other Ambulatory Visit: Payer: Self-pay

## 2020-04-05 VITALS — BP 120/70 | HR 84 | Resp 17 | Ht 62.0 in

## 2020-04-05 DIAGNOSIS — Z7901 Long term (current) use of anticoagulants: Secondary | ICD-10-CM

## 2020-04-05 DIAGNOSIS — I5032 Chronic diastolic (congestive) heart failure: Secondary | ICD-10-CM

## 2020-04-05 DIAGNOSIS — I480 Paroxysmal atrial fibrillation: Secondary | ICD-10-CM

## 2020-04-05 DIAGNOSIS — Z5181 Encounter for therapeutic drug level monitoring: Secondary | ICD-10-CM

## 2020-04-05 LAB — POCT INR: INR: 2.5 (ref 2.0–3.0)

## 2020-04-05 NOTE — Progress Notes (Signed)
Primary Physician:  Alvester Chou, NP    Patient ID: Zoe Cobb, female    DOB: 05/02/1956, 64 y.o.   MRN: [id number]  Subjective:    Chief Complaint  Patient presents with   Atrial Fibrillation   Follow-up    1 year    HPI: Zoe Cobb  is a 64 y.o. female  with chronic diastolic heart failure, paroxysmal atrial fibrillation found in February 2018, and morbid obesity is essentially wheelchair-bound.  In the past, she has had vaginal bleeding with Xarelto and also Coumadin; however, since being back on Coumadin has not had further bleeding.Not had vaginal examination due to difficulty with examination due to her weight.   She is here on a 35-monthoffice visit, she has not had any bleeding diathesis on Coumadin, INR has been therapeutic.  She feels well otherwise.  No specific complaints.  She brings in her labs from her PCP.  Past Medical History:  Diagnosis Date   CHF (congestive heart failure) (HCC)    Heart failure (HCC)    Hypertension    Morbid obesity (HCC)    [place]     History reviewed. No pertinent surgical history.  Social History   Tobacco Use   Smoking status: Former Smoker    Packs/day: 1.00    Years: 9.00    Pack years: 9.00    Types: Cigarettes    Quit date: 1996    Years since quitting: 25.6   Smokeless tobacco: Never Used  Substance Use Topics   Alcohol use: No    Alcohol/week: 0.0 standard drinks   Marital Status: Married   Review of Systems  Cardiovascular: Negative for chest pain, dyspnea on exertion and leg swelling.  Musculoskeletal: Positive for muscle weakness (wheel chair bound).  Gastrointestinal: Negative for melena.      Objective:  Blood pressure 120/70, pulse 84, resp. rate 17, height _0  (1.575 m), SpO2 95 %. Body mass index is 79.38 kg/m.    Physical Exam Constitutional:      Comments: Morbidly obese in no acute distress.  Cardiovascular:     Rate and Rhythm:  Normal rate and regular rhythm.     Pulses:          Carotid pulses are 2+ on the right side and 2+ on the left side.      Dorsalis pedis pulses are 2+ on the right side and 2+ on the left side.       Posterior tibial pulses are 2+ on the right side and 2+ on the left side.     Heart sounds: Normal heart sounds. No murmur heard.  No gallop.      Comments: Femoral and popliteal pulse difficult to feel due to patient's body habitus.  No leg edema. Large adipose tissue present. JVD difficult to see due to short neck. Pulmonary:     Effort: Pulmonary effort is normal.     Breath sounds: Normal breath sounds.  Abdominal:     General: Bowel sounds are normal.     Palpations: Abdomen is soft.     Comments: Obese. Pannus present    Radiology: No results found.  Laboratory examination:   External labs:  Labs 02/13/2020:  Hb 12.1/HCT 38.1, platelets 263.  CBC normal otherwise.  Serum glucose 103 mg, BUN 23, creatinine 0.86, EGFR greater than 60 mL.  03/14/2018:   CBC normal. Creatinine 0.82, eGFR 76/87, Potassium 4.4, Albumin 3.4, Alkaline phosphatase 126, CMP otherwise normal. Cholesterol  155, Triglycerides 74, HDL 46, LDL 94.   PRN Meds:. There are no discontinued medications. Current Meds  Medication Sig   cholecalciferol (VITAMIN D) 1000 units tablet Take 1,000 Units by mouth daily.   metoprolol succinate (TOPROL-XL) 50 MG 24 hr tablet Take 1 tablet by mouth daily.   polyethylene glycol (MIRALAX / GLYCOLAX) packet Take 17 g by mouth daily as needed for mild constipation.   torsemide (DEMADEX) 20 MG tablet Take 3 tablets (60 mg total) by mouth daily.   vitamin B-12 (CYANOCOBALAMIN) 1000 MCG tablet Take 1,000 mcg by mouth daily.   warfarin (COUMADIN) 5 MG tablet Take 1 tablet (5 mg total) by mouth every Monday, Wednesday, Friday, Saturday, and Sunday at 6 PM. Take 1 1/2 (7.5 mg ) Tues and Thurs (Patient taking differently: Take 5 mg by mouth daily at 4 PM. Take 2.5 20m on  Thur, Sat and 5 mg all other days. Refer to most recent anticoagulation note)   Cardiac Studies:   Echocardiogram [09/2016]: Left ventricle: The cavity size was mildly dilated. Wall thickness was increased in a pattern of mild LVH. Systolic function was normal. The estimated ejection fraction was in the range of 55% to 60%. Wall motion was normal; there were no regional wall motion abnormalities. Features are consistent with a pseudonormal left ventricular filling pattern, with concomitant abnormal relaxation and increased filling pressure (grade 2 diastolic dysfunction). - Mitral valve: Calcified annulus. - Left atrium: The atrium was moderately dilated. - Right atrium: The atrium was mildly dilated. - Pulmonary arteries: Systolic pressure was mildly increased. PA peak pressure: 42 mm Hg (S).  EKG:  EKG 04/05/2020: Normal sinus rhythm at rate of 81 bpm, normal axis.  Poor R wave progression, cannot exclude anteroseptal infarct old.  Low-voltage complexes.  No evidence of ischemia, normal QT interval.     Assessment:     ICD-10-CM   1. Paroxysmal atrial fibrillation (HCC)  I48.0 EKG 12-Lead  2. Monitoring for long-term anticoagulant use  Z51.81    Z79.01   3. Morbid obesity due to excess calories (HCC)  E66.01    CHA2DS2-VASc Score is 3.  Yearly risk of stroke: 3.2% (A, F, CHF).  Score of 1=0.6; 2=2.2; 3=3.2; 4=4.8; 5=7.2; 6=9.8; 7=>9.8) -(CHF; HTN; vasc disease DM,  Female = 1; Age <65 =0; 65-74 = 1,  >75 =2; stroke/embolism= 2).    Recommendations:   Zoe Cobb is a 64y.o.  female  with chronic diastolic heart failure, paroxysmal atrial fibrillation found in February 2018, and morbid obesity is essentially wheelchair-bound.  She is presently doing well, there is no clinical evidence of heart failure, she is tolerating Coumadin without bleeding diathesis.  I reviewed her external labs, CBC and renal function is remained stable.  There has been no recent hospitalization  as well.  I again reviewed with her regarding weight loss, EKG discussed with the patient, she is maintaining sinus rhythm.  No changes were done today, initially her blood pressure was elevated upon presentation but on recheck is completely normal.  I will see her back on annual basis.  Her INR is therapeutic, she will continue with Coumadin management as per INR clinic.

## 2020-04-09 ENCOUNTER — Other Ambulatory Visit: Payer: Self-pay | Admitting: Cardiology

## 2020-04-09 DIAGNOSIS — I480 Paroxysmal atrial fibrillation: Secondary | ICD-10-CM

## 2020-04-09 DIAGNOSIS — Z5181 Encounter for therapeutic drug level monitoring: Secondary | ICD-10-CM

## 2020-04-09 DIAGNOSIS — Z7901 Long term (current) use of anticoagulants: Secondary | ICD-10-CM

## 2020-05-10 ENCOUNTER — Ambulatory Visit: Payer: Medicare Other | Admitting: Pharmacist

## 2020-05-10 ENCOUNTER — Other Ambulatory Visit: Payer: Self-pay

## 2020-05-10 DIAGNOSIS — Z7901 Long term (current) use of anticoagulants: Secondary | ICD-10-CM

## 2020-05-10 DIAGNOSIS — I480 Paroxysmal atrial fibrillation: Secondary | ICD-10-CM

## 2020-05-10 DIAGNOSIS — Z5181 Encounter for therapeutic drug level monitoring: Secondary | ICD-10-CM

## 2020-05-10 LAB — POCT INR: INR: 3.1 — AB (ref 2.0–3.0)

## 2020-05-10 NOTE — Progress Notes (Signed)
Anticoagulation Management Zoe Cobb is a 64 y.o. female who reports to the clinic for monitoring of warfarin treatment.   Indication: atrial fibrillation CHA2DS2 Vasc Score 3 (Age 55-74, Female, CHF hx), HAS-BLED 1 (Age >55)  Duration: indefinite Supervising physician: Yates Decamp  Anticoagulation Clinic Visit History:  Patient does not report signs/symptoms of bleeding or thromboembolism  Other recent changes: No change in diet, medications, lifestyle. Pt's current Vit K intake consists of spinach and collard greens once daily, broccoli 2-3x week, lettuce once or twice a week with sandwiches. Pt had a f/u dentist OV for a single teeth removal on 04/21/20. Held warfarin dose for 3 days prior and then continued back to previous maintenance dose the evening of surgery. Complains of wrist pain over the past week. Was started on Norco for pain management. Pt also notes of have decreased appetite over the past week in setting of worsening wrist pain. Has a f/u PCP visit later this week for better pain management.   Anticoagulation Episode Summary    Current INR goal:  2.0-3.0  TTR:  50.3 % (1.6 y)  Next INR check:  06/07/2020  INR from last check:  3.1 (05/10/2020)  Weekly max warfarin dose:    Target end date:    INR check location:    Preferred lab:    Send INR reminders to:     Indications   Atrial fibrillation (HCC) [I48.91] Monitoring for long-term anticoagulant use [Z51.81 Z79.01]       Comments:          Allergies  Allergen Reactions   Penicillins Swelling    Has patient had a PCN reaction causing immediate rash, facial/tongue/throat swelling, SOB or lightheadedness with hypotension: Yes Has patient had a PCN reaction causing severe rash involving mucus membranes or skin necrosis: No Has patient had a PCN reaction that required hospitalization No Has patient had a PCN reaction occurring within the last 10 years: No If all of the above answers are "NO", then may  proceed with Cephalosporin use.     Current Outpatient Medications:    cholecalciferol (VITAMIN D) 1000 units tablet, Take 1,000 Units by mouth daily., Disp: , Rfl:    metoprolol succinate (TOPROL-XL) 50 MG 24 hr tablet, Take 1 tablet by mouth daily., Disp: , Rfl:    polyethylene glycol (MIRALAX / GLYCOLAX) packet, Take 17 g by mouth daily as needed for mild constipation., Disp: 14 each, Rfl: 0   torsemide (DEMADEX) 20 MG tablet, Take 3 tablets (60 mg total) by mouth daily., Disp: 90 tablet, Rfl: 0   vitamin B-12 (CYANOCOBALAMIN) 1000 MCG tablet, Take 1,000 mcg by mouth daily., Disp: , Rfl:    warfarin (COUMADIN) 5 MG tablet, Take 1 tablet (5 mg total) by mouth daily at 4 PM. Refer to most recent anticoagulation note for most updated dosing instructions., Disp: 90 tablet, Rfl: 3 Past Medical History:  Diagnosis Date   CHF (congestive heart failure) (HCC)    Heart failure (HCC)    Hypertension    Morbid obesity (HCC)    Paroxysmal atrial fibrillation (HCC)     ASSESSMENT  Recent Results: The most recent result is correlated with 30 mg per week:  Lab Results  Component Value Date   INR 3.1 (A) 05/10/2020   INR 2.5 04/05/2020   INR 3.2 (A) 03/17/2020    Anticoagulation Dosing: Description   INR above goal. Take 2.5 mg today and then continue to take 2.5 mg every Thur, Sat and 5 mg  all other days. Recheck INR in 4 weeks.      INR today: Supratherapeutic likely in the setting of poor appetite and decreased Vit K intake. Pt has been previously stable. Denies any complains of bleeding or bruising symptoms. Denies any other relevant changes in her diet, medications, or lifestyle. Will continue current dose and continue monitoring.  PLAN Weekly dose was unchanged by 0% to 30 mg/week. Take 2.5 mg today and then continue taking 2.5 mg every Thur, Sat and 5 mg all other days. Recheck INR in 4 weeks.   Patient Instructions  INR above goal. Take 2.5 mg today and then continue  to take 2.5 mg every Thur, Sat and 5 mg all other days. Recheck INR in 4 weeks.  Patient advised to contact clinic or seek medical attention if signs/symptoms of bleeding or thromboembolism occur.  Patient verbalized understanding by repeating back information and was advised to contact me if further medication-related questions arise.   Follow-up Return in about 4 weeks (around 06/07/2020).  Leonides Schanz, PharmD  15 minutes spent face-to-face with the patient during the encounter. 50% of time spent on education, including signs/sx bleeding and clotting, as well as food and drug interactions with warfarin. 50% of time was spent on fingerprick POC INR sample collection,processing, results determination, and documentation

## 2020-05-10 NOTE — Patient Instructions (Signed)
INR above goal. Take 2.5 mg today and then continue to take 2.5 mg every Thur, Sat and 5 mg all other days. Recheck INR in 4 weeks.

## 2020-05-13 ENCOUNTER — Other Ambulatory Visit: Payer: Self-pay

## 2020-05-13 ENCOUNTER — Ambulatory Visit (HOSPITAL_COMMUNITY)
Admission: EM | Admit: 2020-05-13 | Discharge: 2020-05-13 | Disposition: A | Discharge status: Home / Self Care | Payer: Medicaid Other

## 2020-06-07 ENCOUNTER — Other Ambulatory Visit: Payer: Self-pay

## 2020-06-07 ENCOUNTER — Ambulatory Visit: Payer: Medicare Other | Admitting: Pharmacist

## 2020-06-07 DIAGNOSIS — Z5181 Encounter for therapeutic drug level monitoring: Secondary | ICD-10-CM

## 2020-06-07 DIAGNOSIS — I480 Paroxysmal atrial fibrillation: Secondary | ICD-10-CM

## 2020-06-07 LAB — POCT INR: INR: 3.1 — AB (ref 2.0–3.0)

## 2020-06-07 NOTE — Patient Instructions (Signed)
INR above goal. Decrease weekly dose to 2.5 mg every Tues, Thur, Sat and 5 mg all other days. Recheck INR in 3 weeks.

## 2020-06-07 NOTE — Progress Notes (Addendum)
Anticoagulation Management Zoe Cobb is a 64 y.o. female who reports to the clinic for monitoring of warfarin treatment.   Indication: atrial fibrillation CHA2DS2 Vasc Score 3 (Age 70-74, Female, CHF hx), HAS-BLED 1 (Age >35)  Duration: indefinite Supervising physician: Yates Decamp  Anticoagulation Clinic Visit History:  Patient does not report signs/symptoms of bleeding or thromboembolism  Other recent changes: No change in diet, medications, lifestyle. Pt's current Vit K intake consists of spinach and collard greens once daily, broccoli 2-3x week, lettuce once or twice a week with sandwiches.   Pt reports that she went to UC for her sore arm since last INR check. Per imaging, pt was told that she has a sprained wrist. Was told to mange with warm compress and PRN tylenol. Wrist pain has improved since.  Anticoagulation Episode Summary    Current INR goal:  2.0-3.0  TTR:  47.9 % (1.7 y)  Next INR check:  06/29/2020  INR from last check:  3.1 (06/07/2020)  Weekly max warfarin dose:    Target end date:    INR check location:    Preferred lab:    Send INR reminders to:     Indications   Atrial fibrillation (HCC) [I48.91] Monitoring for long-term anticoagulant use [Z51.81 Z79.01]       Comments:          Allergies  Allergen Reactions  . Penicillins Swelling    Has patient had a PCN reaction causing immediate rash, facial/tongue/throat swelling, SOB or lightheadedness with hypotension: Yes Has patient had a PCN reaction causing severe rash involving mucus membranes or skin necrosis: No Has patient had a PCN reaction that required hospitalization No Has patient had a PCN reaction occurring within the last 10 years: No If all of the above answers are "NO", then may proceed with Cephalosporin use.     Current Outpatient Medications:  .  cholecalciferol (VITAMIN D) 1000 units tablet, Take 1,000 Units by mouth daily., Disp: , Rfl:  .  metoprolol succinate (TOPROL-XL) 50  MG 24 hr tablet, Take 1 tablet by mouth daily., Disp: , Rfl:  .  polyethylene glycol (MIRALAX / GLYCOLAX) packet, Take 17 g by mouth daily as needed for mild constipation., Disp: 14 each, Rfl: 0 .  torsemide (DEMADEX) 20 MG tablet, Take 3 tablets (60 mg total) by mouth daily., Disp: 90 tablet, Rfl: 0 .  vitamin B-12 (CYANOCOBALAMIN) 1000 MCG tablet, Take 1,000 mcg by mouth daily., Disp: , Rfl:  .  warfarin (COUMADIN) 5 MG tablet, Take 1 tablet (5 mg total) by mouth daily at 4 PM. Refer to most recent anticoagulation note for most updated dosing instructions. (Patient taking differently: Take 5 mg by mouth daily at 4 PM. 2.5 mg every Tue, Thu, Sat; 5 mg all other days. Refer to most recent anticoagulation note for most updated dosing instructions.), Disp: 90 tablet, Rfl: 3 Past Medical History:  Diagnosis Date  . CHF (congestive heart failure) (HCC)   . Heart failure (HCC)   . Hypertension   . Morbid obesity (HCC)   . Paroxysmal atrial fibrillation (HCC)     ASSESSMENT  Recent Results: The most recent result is correlated with 30 mg per week:  Lab Results  Component Value Date   INR 3.1 (A) 06/07/2020   INR 3.1 (A) 05/10/2020   INR 2.5 04/05/2020    Anticoagulation Dosing: Description   INR above goal. Decrease weekly dose to 2.5 mg every Tues, Thur, Sat and 5 mg all other days. Recheck  INR in 3 weeks.      INR today: Supratherapeutic. INR continues to remain slightly elevated over the past two INR checks. Denies any complains of bleeding or bruising symptoms. Denies any other relevant changes in her diet, medications, or lifestyle. Warrants weekly dose decrease and continued close monitoring.   PLAN Weekly dose was decreased by 8.3% to 27.5 mg/week. Decrease weekly dose to 2.5 mg every Tues,Thur, Sat and 5 mg all other days. Recheck INR in 3 weeks.   Patient Instructions  INR above goal. Decrease weekly dose to 2.5 mg every Tues, Thur, Sat and 5 mg all other days. Recheck INR in  3 weeks.  Patient advised to contact clinic or seek medical attention if signs/symptoms of bleeding or thromboembolism occur.  Patient verbalized understanding by repeating back information and was advised to contact me if further medication-related questions arise.   Follow-up Return in about 22 days (around 06/29/2020).  Leonides Schanz, PharmD  15 minutes spent face-to-face with the patient during the encounter. 50% of time spent on education, including signs/sx bleeding and clotting, as well as food and drug interactions with warfarin. 50% of time was spent on fingerprick POC INR sample collection,processing, results determination, and documentation

## 2020-06-29 ENCOUNTER — Other Ambulatory Visit: Payer: Self-pay

## 2020-06-29 ENCOUNTER — Ambulatory Visit: Payer: Medicare Other | Admitting: Pharmacist

## 2020-06-29 DIAGNOSIS — I480 Paroxysmal atrial fibrillation: Secondary | ICD-10-CM

## 2020-06-29 DIAGNOSIS — Z5181 Encounter for therapeutic drug level monitoring: Secondary | ICD-10-CM

## 2020-06-29 LAB — POCT INR: INR: 2.6 (ref 2.0–3.0)

## 2020-06-29 NOTE — Patient Instructions (Signed)
INR at goal. Continue current weekly dose of 2.5 mg every Tues, Thur, Sat and 5 mg all other days. Recheck INR in 3 weeks.

## 2020-06-29 NOTE — Progress Notes (Signed)
Anticoagulation Management Zoe Cobb is a 64 y.o. female who reports to the clinic for monitoring of warfarin treatment.   Indication: atrial fibrillation CHA2DS2 Vasc Score 3 (Age 53-74, Female, CHF hx), HAS-BLED 1 (Age >21)  Duration: indefinite Supervising physician: Yates Decamp  Anticoagulation Clinic Visit History:  Patient does not report signs/symptoms of bleeding or thromboembolism  Other recent changes: No change in diet, medications, lifestyle. Pt's current Vit K intake consists of spinach and collard greens once daily, broccoli 2-3x week, lettuce once or twice a week with sandwiches.   Anticoagulation Episode Summary    Current INR goal:  2.0-3.0  TTR:  49.0 % (1.7 y)  Next INR check:  07/20/2020  INR from last check:  2.6 (06/29/2020)  Weekly max warfarin dose:    Target end date:    INR check location:    Preferred lab:    Send INR reminders to:     Indications   Atrial fibrillation (HCC) [I48.91] Monitoring for long-term anticoagulant use [Z51.81 Z79.01]       Comments:          Allergies  Allergen Reactions  . Penicillins Swelling    Has patient had a PCN reaction causing immediate rash, facial/tongue/throat swelling, SOB or lightheadedness with hypotension: Yes Has patient had a PCN reaction causing severe rash involving mucus membranes or skin necrosis: No Has patient had a PCN reaction that required hospitalization No Has patient had a PCN reaction occurring within the last 10 years: No If all of the above answers are "NO", then may proceed with Cephalosporin use.     Current Outpatient Medications:  .  cholecalciferol (VITAMIN D) 1000 units tablet, Take 1,000 Units by mouth daily., Disp: , Rfl:  .  metoprolol succinate (TOPROL-XL) 50 MG 24 hr tablet, Take 1 tablet by mouth daily., Disp: , Rfl:  .  polyethylene glycol (MIRALAX / GLYCOLAX) packet, Take 17 g by mouth daily as needed for mild constipation., Disp: 14 each, Rfl: 0 .  torsemide  (DEMADEX) 20 MG tablet, Take 3 tablets (60 mg total) by mouth daily., Disp: 90 tablet, Rfl: 0 .  vitamin B-12 (CYANOCOBALAMIN) 1000 MCG tablet, Take 1,000 mcg by mouth daily., Disp: , Rfl:  .  warfarin (COUMADIN) 5 MG tablet, Take 1 tablet (5 mg total) by mouth daily at 4 PM. Refer to most recent anticoagulation note for most updated dosing instructions. (Patient taking differently: Take 5 mg by mouth daily at 4 PM. 2.5 mg every Tue, Thu, Sat; 5 mg all other days. Refer to most recent anticoagulation note for most updated dosing instructions.), Disp: 90 tablet, Rfl: 3 Past Medical History:  Diagnosis Date  . CHF (congestive heart failure) (HCC)   . Heart failure (HCC)   . Hypertension   . Morbid obesity (HCC)   . Paroxysmal atrial fibrillation (HCC)     ASSESSMENT  Recent Results: The most recent result is correlated with 27.5 mg per week:  Lab Results  Component Value Date   INR 2.6 06/29/2020   INR 3.1 (A) 06/07/2020   INR 3.1 (A) 05/10/2020    Anticoagulation Dosing: Description   INR at goal. Continue current weekly dose of 2.5 mg every Tues, Thur, Sat and 5 mg all other days. Recheck INR in 3 weeks.      INR today: Therapeutic. Improved following recent weekly dose adjustment. Denies any complains of bleeding or bruising symptoms. Denies any other relevant changes in her diet, medications, or lifestyle. Will continue current weekly  dose and close monitoring.    PLAN Weekly dose was unchanged by 0% to 27.5 mg/week. Continue current weekly dose to 2.5 mg every Tues,Thur, Sat and 5 mg all other days. Recheck INR in 3 weeks.   Patient Instructions  INR at goal. Continue current weekly dose of 2.5 mg every Tues, Thur, Sat and 5 mg all other days. Recheck INR in 3 weeks.  Patient advised to contact clinic or seek medical attention if signs/symptoms of bleeding or thromboembolism occur.  Patient verbalized understanding by repeating back information and was advised to contact me  if further medication-related questions arise.   Follow-up Return in about 3 weeks (around 07/20/2020).  Leonides Schanz, PharmD  15 minutes spent face-to-face with the patient during the encounter. 50% of time spent on education, including signs/sx bleeding and clotting, as well as food and drug interactions with warfarin. 50% of time was spent on fingerprick POC INR sample collection,processing, results determination, and documentation

## 2020-07-20 ENCOUNTER — Ambulatory Visit: Payer: Medicare Other | Admitting: Pharmacist

## 2020-07-20 ENCOUNTER — Other Ambulatory Visit: Payer: Self-pay

## 2020-07-20 DIAGNOSIS — Z7901 Long term (current) use of anticoagulants: Secondary | ICD-10-CM

## 2020-07-20 DIAGNOSIS — I480 Paroxysmal atrial fibrillation: Secondary | ICD-10-CM

## 2020-07-20 DIAGNOSIS — Z5181 Encounter for therapeutic drug level monitoring: Secondary | ICD-10-CM

## 2020-07-20 LAB — POCT INR: INR: 2.5 (ref 2.0–3.0)

## 2020-07-20 NOTE — Progress Notes (Signed)
Anticoagulation Management Zoe Cobb is a 64 y.o. female who reports to the clinic for monitoring of warfarin treatment.   Indication: atrial fibrillation CHA2DS2 Vasc Score 3 (Age 53-74, Female, CHF hx), HAS-BLED 1 (Age >16)  Duration: indefinite Supervising physician: Yates Decamp  Anticoagulation Clinic Visit History:  Patient does not report signs/symptoms of bleeding or thromboembolism  Other recent changes: No change in diet, medications, lifestyle. Pt's current Vit K intake consists of spinach and collard greens once daily, broccoli 2-3x week, lettuce once or twice a week with sandwiches.   Anticoagulation Episode Summary    Current INR goal:  2.0-3.0  TTR:  50.7 % (1.8 y)  Next INR check:  08/31/2020  INR from last check:  2.5 (07/20/2020)  Weekly max warfarin dose:    Target end date:    INR check location:    Preferred lab:    Send INR reminders to:     Indications   Atrial fibrillation (HCC) [I48.91] Monitoring for long-term anticoagulant use [Z51.81 Z79.01]       Comments:          Allergies  Allergen Reactions  . Penicillins Swelling    Has patient had a PCN reaction causing immediate rash, facial/tongue/throat swelling, SOB or lightheadedness with hypotension: Yes Has patient had a PCN reaction causing severe rash involving mucus membranes or skin necrosis: No Has patient had a PCN reaction that required hospitalization No Has patient had a PCN reaction occurring within the last 10 years: No If all of the above answers are "NO", then may proceed with Cephalosporin use.     Current Outpatient Medications:  .  cholecalciferol (VITAMIN D) 1000 units tablet, Take 1,000 Units by mouth daily., Disp: , Rfl:  .  metoprolol succinate (TOPROL-XL) 50 MG 24 hr tablet, Take 1 tablet by mouth daily., Disp: , Rfl:  .  polyethylene glycol (MIRALAX / GLYCOLAX) packet, Take 17 g by mouth daily as needed for mild constipation., Disp: 14 each, Rfl: 0 .  torsemide  (DEMADEX) 20 MG tablet, Take 3 tablets (60 mg total) by mouth daily., Disp: 90 tablet, Rfl: 0 .  vitamin B-12 (CYANOCOBALAMIN) 1000 MCG tablet, Take 1,000 mcg by mouth daily., Disp: , Rfl:  .  warfarin (COUMADIN) 5 MG tablet, Take 1 tablet (5 mg total) by mouth daily at 4 PM. Refer to most recent anticoagulation note for most updated dosing instructions. (Patient taking differently: Take 5 mg by mouth daily at 4 PM. 2.5 mg every Tue, Thu, Sat; 5 mg all other days. Refer to most recent anticoagulation note for most updated dosing instructions.), Disp: 90 tablet, Rfl: 3 Past Medical History:  Diagnosis Date  . CHF (congestive heart failure) (HCC)   . Heart failure (HCC)   . Hypertension   . Morbid obesity (HCC)   . Paroxysmal atrial fibrillation (HCC)     ASSESSMENT  Recent Results: The most recent result is correlated with 27.5 mg per week:  Lab Results  Component Value Date   INR 2.5 07/20/2020   INR 2.6 06/29/2020   INR 3.1 (A) 06/07/2020    Anticoagulation Dosing: Description   INR at goal. Continue current weekly dose of 2.5 mg every Tues, Thur, Sat and 5 mg all other days. Recheck INR in 6 weeks.      INR today: Therapeutic. Continues to remain therapeutic on current. Denies any complains of bleeding or bruising symptoms. Denies any other relevant changes in her diet, medications, or lifestyle. Will continue current weekly dose  and chronic monitoring.    PLAN Weekly dose was unchanged by 0% to 27.5 mg/week. Continue current weekly dose to 2.5 mg every Tues,Thur, Sat and 5 mg all other days. Recheck INR in 6 weeks.   Patient Instructions  INR at goal. Continue current weekly dose of 2.5 mg every Tues, Thur, Sat and 5 mg all other days. Recheck INR in 6 weeks.  Patient advised to contact clinic or seek medical attention if signs/symptoms of bleeding or thromboembolism occur.  Patient verbalized understanding by repeating back information and was advised to contact me if  further medication-related questions arise.   Follow-up Return in about 6 weeks (around 08/31/2020).  Leonides Schanz, PharmD  15 minutes spent face-to-face with the patient during the encounter. 50% of time spent on education, including signs/sx bleeding and clotting, as well as food and drug interactions with warfarin. 50% of time was spent on fingerprick POC INR sample collection,processing, results determination, and documentation

## 2020-07-20 NOTE — Patient Instructions (Signed)
INR at goal. Continue current weekly dose of 2.5 mg every Tues, Thur, Sat and 5 mg all other days. Recheck INR in 6 weeks. 

## 2020-08-31 ENCOUNTER — Ambulatory Visit: Payer: Medicare Other | Admitting: Pharmacist

## 2020-08-31 ENCOUNTER — Other Ambulatory Visit: Payer: Self-pay

## 2020-08-31 DIAGNOSIS — Z7901 Long term (current) use of anticoagulants: Secondary | ICD-10-CM

## 2020-08-31 DIAGNOSIS — I480 Paroxysmal atrial fibrillation: Secondary | ICD-10-CM

## 2020-08-31 DIAGNOSIS — Z5181 Encounter for therapeutic drug level monitoring: Secondary | ICD-10-CM

## 2020-08-31 LAB — POCT INR: INR: 2.7 (ref 2.0–3.0)

## 2020-08-31 NOTE — Patient Instructions (Signed)
INR at goal. Continue current weekly dose of 2.5 mg every Tues, Thur, Sat and 5 mg all other days. Recheck INR in 6 weeks. 

## 2020-08-31 NOTE — Progress Notes (Signed)
Anticoagulation Management Zoe Cobb is a 65 y.o. female who reports to the clinic for monitoring of warfarin treatment.   Indication: atrial fibrillation CHA2DS2 Vasc Score 3 (Age 20-74, Female, CHF hx), HAS-BLED 1 (Age >39)  Duration: indefinite Supervising physician: Yates Decamp  Anticoagulation Clinic Visit History:  Patient does not report signs/symptoms of bleeding or thromboembolism  Other recent changes: No change in diet, medications, lifestyle. Pt's current Vit K intake consists of spinach and collard greens once daily, broccoli 2-3x week, lettuce once or twice a week with sandwiches.   Anticoagulation Episode Summary    Current INR goal:  2.0-3.0  TTR:  53.7 % (1.9 y)  Next INR check:  10/12/2020  INR from last check:  2.7 (08/31/2020)  Weekly max warfarin dose:    Target end date:    INR check location:    Preferred lab:    Send INR reminders to:     Indications   Atrial fibrillation (HCC) [I48.91] Monitoring for long-term anticoagulant use [Z51.81 Z79.01]       Comments:          Allergies  Allergen Reactions  . Penicillins Swelling    Has patient had a PCN reaction causing immediate rash, facial/tongue/throat swelling, SOB or lightheadedness with hypotension: Yes Has patient had a PCN reaction causing severe rash involving mucus membranes or skin necrosis: No Has patient had a PCN reaction that required hospitalization No Has patient had a PCN reaction occurring within the last 10 years: No If all of the above answers are "NO", then may proceed with Cephalosporin use.     Current Outpatient Medications:  .  cholecalciferol (VITAMIN D) 1000 units tablet, Take 1,000 Units by mouth daily., Disp: , Rfl:  .  metoprolol succinate (TOPROL-XL) 50 MG 24 hr tablet, Take 1 tablet by mouth daily., Disp: , Rfl:  .  polyethylene glycol (MIRALAX / GLYCOLAX) packet, Take 17 g by mouth daily as needed for mild constipation., Disp: 14 each, Rfl: 0 .  torsemide  (DEMADEX) 20 MG tablet, Take 3 tablets (60 mg total) by mouth daily., Disp: 90 tablet, Rfl: 0 .  vitamin B-12 (CYANOCOBALAMIN) 1000 MCG tablet, Take 1,000 mcg by mouth daily., Disp: , Rfl:  .  warfarin (COUMADIN) 5 MG tablet, Take 1 tablet (5 mg total) by mouth daily at 4 PM. Refer to most recent anticoagulation note for most updated dosing instructions. (Patient taking differently: Take 5 mg by mouth daily at 4 PM. 2.5 mg every Tue, Thu, Sat; 5 mg all other days. Refer to most recent anticoagulation note for most updated dosing instructions.), Disp: 90 tablet, Rfl: 3 Past Medical History:  Diagnosis Date  . CHF (congestive heart failure) (HCC)   . Heart failure (HCC)   . Hypertension   . Morbid obesity (HCC)   . Paroxysmal atrial fibrillation (HCC)     ASSESSMENT  Recent Results: The most recent result is correlated with 27.5 mg per week:  Lab Results  Component Value Date   INR 2.7 08/31/2020   INR 2.5 07/20/2020   INR 2.6 06/29/2020    Anticoagulation Dosing: Description   INR at goal. Continue current weekly dose of 2.5 mg every Tues, Thur, Sat and 5 mg all other days. Recheck INR in 6 weeks.      INR today: Therapeutic. Continues to remain therapeutic on current. Denies any complains of bleeding or bruising symptoms. Denies any other relevant changes in her diet, medications, or lifestyle. Will continue current weekly dose and  chronic monitoring.    PLAN Weekly dose was unchanged by 0% to 27.5 mg/week. Continue current weekly dose to 2.5 mg every Tues,Thur, Sat and 5 mg all other days. Recheck INR in 6 weeks.   Patient Instructions  INR at goal. Continue current weekly dose of 2.5 mg every Tues, Thur, Sat and 5 mg all other days. Recheck INR in 6 weeks.  Patient advised to contact clinic or seek medical attention if signs/symptoms of bleeding or thromboembolism occur.  Patient verbalized understanding by repeating back information and was advised to contact me if further  medication-related questions arise.   Follow-up Return in about 6 weeks (around 10/12/2020).  Leonides Schanz, PharmD  15 minutes spent face-to-face with the patient during the encounter. 50% of time spent on education, including signs/sx bleeding and clotting, as well as food and drug interactions with warfarin. 50% of time was spent on fingerprick POC INR sample collection,processing, results determination, and documentation

## 2020-10-12 ENCOUNTER — Other Ambulatory Visit: Payer: Self-pay

## 2020-10-12 ENCOUNTER — Ambulatory Visit: Payer: 59 | Admitting: Pharmacist

## 2020-10-12 DIAGNOSIS — I480 Paroxysmal atrial fibrillation: Secondary | ICD-10-CM

## 2020-10-12 DIAGNOSIS — Z5181 Encounter for therapeutic drug level monitoring: Secondary | ICD-10-CM

## 2020-10-12 LAB — POCT INR: INR: 2.7 (ref 2.0–3.0)

## 2020-10-12 NOTE — Progress Notes (Signed)
Anticoagulation Management Zoe Cobb is a 65 y.o. female who reports to the clinic for monitoring of warfarin treatment.   Indication: atrial fibrillation CHA2DS2 Vasc Score 3 (Age 76-74, Female, CHF hx), HAS-BLED 1 (Age >16)  Duration: indefinite Supervising physician: Yates Decamp  Anticoagulation Clinic Visit History:  Patient does not report signs/symptoms of bleeding or thromboembolism  Other recent changes: No change in diet, medications, lifestyle. Pt's current Vit K intake consists of spinach and collard greens once daily, broccoli 2-3x week, lettuce once or twice a week with sandwiches.   Anticoagulation Episode Summary    Current INR goal:  2.0-3.0  TTR:  56.4 % (2 y)  Next INR check:  11/23/2020  INR from last check:  2.7 (10/12/2020)  Weekly max warfarin dose:    Target end date:    INR check location:    Preferred lab:    Send INR reminders to:     Indications   Atrial fibrillation (HCC) [I48.91] Monitoring for long-term anticoagulant use [Z51.81 Z79.01]       Comments:          Allergies  Allergen Reactions  . Penicillins Swelling    Has patient had a PCN reaction causing immediate rash, facial/tongue/throat swelling, SOB or lightheadedness with hypotension: Yes Has patient had a PCN reaction causing severe rash involving mucus membranes or skin necrosis: No Has patient had a PCN reaction that required hospitalization No Has patient had a PCN reaction occurring within the last 10 years: No If all of the above answers are "NO", then may proceed with Cephalosporin use.     Current Outpatient Medications:  .  cholecalciferol (VITAMIN D) 1000 units tablet, Take 1,000 Units by mouth daily., Disp: , Rfl:  .  metoprolol succinate (TOPROL-XL) 50 MG 24 hr tablet, Take 1 tablet by mouth daily., Disp: , Rfl:  .  polyethylene glycol (MIRALAX / GLYCOLAX) packet, Take 17 g by mouth daily as needed for mild constipation., Disp: 14 each, Rfl: 0 .  torsemide (DEMADEX)  20 MG tablet, Take 3 tablets (60 mg total) by mouth daily., Disp: 90 tablet, Rfl: 0 .  vitamin B-12 (CYANOCOBALAMIN) 1000 MCG tablet, Take 1,000 mcg by mouth daily., Disp: , Rfl:  .  warfarin (COUMADIN) 5 MG tablet, Take 1 tablet (5 mg total) by mouth daily at 4 PM. Refer to most recent anticoagulation note for most updated dosing instructions. (Patient taking differently: Take 5 mg by mouth daily at 4 PM. 2.5 mg every Tue, Thu, Sat; 5 mg all other days. Refer to most recent anticoagulation note for most updated dosing instructions.), Disp: 90 tablet, Rfl: 3 Past Medical History:  Diagnosis Date  . CHF (congestive heart failure) (HCC)   . Heart failure (HCC)   . Hypertension   . Morbid obesity (HCC)   . Paroxysmal atrial fibrillation (HCC)     ASSESSMENT  Recent Results: The most recent result is correlated with 27.5 mg per week:  Lab Results  Component Value Date   INR 2.7 10/12/2020   INR 2.7 08/31/2020   INR 2.5 07/20/2020    Anticoagulation Dosing: Description   INR at goal. Continue current weekly dose of 2.5 mg every Tues, Thur, Sat and 5 mg all other days. Recheck INR in 6 weeks.      INR today: Therapeutic. Continues to remain therapeutic on current. Denies any complains of bleeding or bruising symptoms. Denies any other relevant changes in her diet, medications, or lifestyle. Will continue current weekly dose and chronic  monitoring.    PLAN Weekly dose was unchanged by 0% to 27.5 mg/week. Continue current weekly dose to 2.5 mg every Tues,Thur, Sat and 5 mg all other days. Recheck INR in 6 weeks.   Patient Instructions  INR at goal. Continue current weekly dose of 2.5 mg every Tues, Thur, Sat and 5 mg all other days. Recheck INR in 6 weeks.  Patient advised to contact clinic or seek medical attention if signs/symptoms of bleeding or thromboembolism occur.  Patient verbalized understanding by repeating back information and was advised to contact me if further  medication-related questions arise.   Follow-up Return in about 6 weeks (around 11/23/2020).  Leonides Schanz, PharmD  15 minutes spent face-to-face with the patient during the encounter. 50% of time spent on education, including signs/sx bleeding and clotting, as well as food and drug interactions with warfarin. 50% of time was spent on fingerprick POC INR sample collection,processing, results determination, and documentation

## 2020-10-12 NOTE — Patient Instructions (Signed)
INR at goal. Continue current weekly dose of 2.5 mg every Tues, Thur, Sat and 5 mg all other days. Recheck INR in 6 weeks.

## 2020-11-30 ENCOUNTER — Ambulatory Visit: Payer: 59 | Admitting: Pharmacist

## 2020-11-30 ENCOUNTER — Other Ambulatory Visit: Payer: Self-pay

## 2020-11-30 DIAGNOSIS — Z5181 Encounter for therapeutic drug level monitoring: Secondary | ICD-10-CM

## 2020-11-30 DIAGNOSIS — Z7901 Long term (current) use of anticoagulants: Secondary | ICD-10-CM

## 2020-11-30 DIAGNOSIS — I480 Paroxysmal atrial fibrillation: Secondary | ICD-10-CM

## 2020-11-30 LAB — POCT INR: INR: 4.5 — AB (ref 2.0–3.0)

## 2020-11-30 NOTE — Patient Instructions (Signed)
INR above goal. HOLD today and take 2.5 mg tomorrow, then continue current weekly dose of 2.5 mg every Tues, Thur, Sat and 5 mg all other days. Recheck INR in 3 weeks.

## 2020-11-30 NOTE — Progress Notes (Signed)
Anticoagulation Management Zoe Cobb is a 65 y.o. female who reports to the clinic for monitoring of warfarin treatment.   Indication: atrial fibrillation CHA2DS2 Vasc Score 3 (Age 58-74, Female, CHF hx), HAS-BLED 1 (Age >4)  Duration: indefinite Supervising physician: Yates Decamp  Anticoagulation Clinic Visit History:  Patient does not report signs/symptoms of bleeding or thromboembolism  Other recent changes: No change in diet, medications, lifestyle. Pt's current Vit K intake consists of spinach and collard greens once daily, spring beans 2-3x week, lettuce once or twice a week with sandwiches.   Pt recently had several housing stressors and reports that her appetite has been off. Reports to having lower Vit K intake than previous goal.   Anticoagulation Episode Summary    Current INR goal:  2.0-3.0  TTR:  53.9 % (2.1 y)  Next INR check:  12/21/2020  INR from last check:  4.5 (11/30/2020)  Weekly max warfarin dose:    Target end date:    INR check location:    Preferred lab:    Send INR reminders to:     Indications   Atrial fibrillation (HCC) [I48.91] Monitoring for long-term anticoagulant use [Z51.81 Z79.01]       Comments:          Allergies  Allergen Reactions  . Penicillins Swelling    Has patient had a PCN reaction causing immediate rash, facial/tongue/throat swelling, SOB or lightheadedness with hypotension: Yes Has patient had a PCN reaction causing severe rash involving mucus membranes or skin necrosis: No Has patient had a PCN reaction that required hospitalization No Has patient had a PCN reaction occurring within the last 10 years: No If all of the above answers are "NO", then may proceed with Cephalosporin use.     Current Outpatient Medications:  .  cholecalciferol (VITAMIN D) 1000 units tablet, Take 1,000 Units by mouth daily., Disp: , Rfl:  .  metoprolol succinate (TOPROL-XL) 50 MG 24 hr tablet, Take 1 tablet by mouth daily., Disp: , Rfl:  .   polyethylene glycol (MIRALAX / GLYCOLAX) packet, Take 17 g by mouth daily as needed for mild constipation., Disp: 14 each, Rfl: 0 .  torsemide (DEMADEX) 20 MG tablet, Take 3 tablets (60 mg total) by mouth daily., Disp: 90 tablet, Rfl: 0 .  vitamin B-12 (CYANOCOBALAMIN) 1000 MCG tablet, Take 1,000 mcg by mouth daily., Disp: , Rfl:  .  warfarin (COUMADIN) 5 MG tablet, Take 1 tablet (5 mg total) by mouth daily at 4 PM. Refer to most recent anticoagulation note for most updated dosing instructions. (Patient taking differently: Take 5 mg by mouth daily at 4 PM. 2.5 mg every Tue, Thu, Sat; 5 mg all other days. Refer to most recent anticoagulation note for most updated dosing instructions.), Disp: 90 tablet, Rfl: 3 Past Medical History:  Diagnosis Date  . CHF (congestive heart failure) (HCC)   . Heart failure (HCC)   . Hypertension   . Morbid obesity (HCC)   . Paroxysmal atrial fibrillation (HCC)     ASSESSMENT  Recent Results: The most recent result is correlated with 27.5 mg per week:  Lab Results  Component Value Date   INR 4.5 (A) 11/30/2020   INR 2.7 10/12/2020   INR 2.7 08/31/2020    Anticoagulation Dosing: Description   INR above goal. HOLD today and take 2.5 mg tomorrow, then continue current weekly dose of 2.5 mg every Tues, Thur, Sat and 5 mg all other days. Recheck INR in 3 weeks.  INR today: Supratherapeutic. Possibly in the setting of decreased Vit K intake than previous baseline. Pt planning on returning to previous goal. Pt was previously well controlled in current weekly dose. Pt denies any complains of bleeding or bruising symptoms. Denies any other relevant changes in her diet, medications, or lifestyle. Will dose adjust and closely monitor to ensure INR returns back within therapeutic INR.  PLAN Weekly dose was unchanged by 0% to 27.5 mg/week. HOLD today and take 2.5 mg tomorrow, then continue current weekly dose of 2.5 mg every Tues,Thur, Sat and 5 mg all other days.  Recheck INR in 3 weeks.   Patient Instructions  INR above goal. HOLD today and take 2.5 mg tomorrow, then continue current weekly dose of 2.5 mg every Tues, Thur, Sat and 5 mg all other days. Recheck INR in 3 weeks.  Patient advised to contact clinic or seek medical attention if signs/symptoms of bleeding or thromboembolism occur.  Patient verbalized understanding by repeating back information and was advised to contact me if further medication-related questions arise.   Follow-up Return in about 3 weeks (around 12/21/2020).  Leonides Schanz, PharmD  15 minutes spent face-to-face with the patient during the encounter. 50% of time spent on education, including signs/sx bleeding and clotting, as well as food and drug interactions with warfarin. 50% of time was spent on fingerprick POC INR sample collection,processing, results determination, and documentation

## 2020-12-12 ENCOUNTER — Other Ambulatory Visit: Payer: Self-pay

## 2020-12-12 ENCOUNTER — Inpatient Hospital Stay (HOSPITAL_COMMUNITY)
Admission: EM | Admit: 2020-12-12 | Discharge: 2020-12-17 | DRG: 378 | Disposition: A | Discharge status: Home / Self Care | Payer: 59 | Attending: Internal Medicine | Admitting: Internal Medicine

## 2020-12-12 ENCOUNTER — Encounter (HOSPITAL_COMMUNITY): Payer: Self-pay | Admitting: Emergency Medicine

## 2020-12-12 DIAGNOSIS — D649 Anemia, unspecified: Secondary | ICD-10-CM

## 2020-12-12 DIAGNOSIS — Z880 Allergy status to penicillin: Secondary | ICD-10-CM

## 2020-12-12 DIAGNOSIS — Z7901 Long term (current) use of anticoagulants: Secondary | ICD-10-CM

## 2020-12-12 DIAGNOSIS — Z841 Family history of disorders of kidney and ureter: Secondary | ICD-10-CM

## 2020-12-12 DIAGNOSIS — Z8249 Family history of ischemic heart disease and other diseases of the circulatory system: Secondary | ICD-10-CM

## 2020-12-12 DIAGNOSIS — D62 Acute posthemorrhagic anemia: Secondary | ICD-10-CM | POA: Diagnosis present

## 2020-12-12 DIAGNOSIS — E8809 Other disorders of plasma-protein metabolism, not elsewhere classified: Secondary | ICD-10-CM | POA: Diagnosis present

## 2020-12-12 DIAGNOSIS — K921 Melena: Secondary | ICD-10-CM

## 2020-12-12 DIAGNOSIS — Z20822 Contact with and (suspected) exposure to covid-19: Secondary | ICD-10-CM | POA: Diagnosis present

## 2020-12-12 DIAGNOSIS — I959 Hypotension, unspecified: Secondary | ICD-10-CM | POA: Diagnosis present

## 2020-12-12 DIAGNOSIS — I5032 Chronic diastolic (congestive) heart failure: Secondary | ICD-10-CM | POA: Diagnosis present

## 2020-12-12 DIAGNOSIS — R739 Hyperglycemia, unspecified: Secondary | ICD-10-CM | POA: Diagnosis present

## 2020-12-12 DIAGNOSIS — K5731 Diverticulosis of large intestine without perforation or abscess with bleeding: Secondary | ICD-10-CM | POA: Diagnosis not present

## 2020-12-12 DIAGNOSIS — Z87891 Personal history of nicotine dependence: Secondary | ICD-10-CM

## 2020-12-12 DIAGNOSIS — T45515A Adverse effect of anticoagulants, initial encounter: Secondary | ICD-10-CM | POA: Diagnosis present

## 2020-12-12 DIAGNOSIS — I110 Hypertensive heart disease with heart failure: Secondary | ICD-10-CM | POA: Diagnosis present

## 2020-12-12 DIAGNOSIS — Z833 Family history of diabetes mellitus: Secondary | ICD-10-CM

## 2020-12-12 DIAGNOSIS — R1030 Lower abdominal pain, unspecified: Secondary | ICD-10-CM

## 2020-12-12 DIAGNOSIS — Z79899 Other long term (current) drug therapy: Secondary | ICD-10-CM

## 2020-12-12 DIAGNOSIS — Z800 Family history of malignant neoplasm of digestive organs: Secondary | ICD-10-CM

## 2020-12-12 DIAGNOSIS — D6832 Hemorrhagic disorder due to extrinsic circulating anticoagulants: Secondary | ICD-10-CM | POA: Diagnosis present

## 2020-12-12 DIAGNOSIS — Z6841 Body Mass Index (BMI) 40.0 and over, adult: Secondary | ICD-10-CM

## 2020-12-12 DIAGNOSIS — K922 Gastrointestinal hemorrhage, unspecified: Secondary | ICD-10-CM | POA: Diagnosis not present

## 2020-12-12 DIAGNOSIS — D696 Thrombocytopenia, unspecified: Secondary | ICD-10-CM | POA: Diagnosis present

## 2020-12-12 DIAGNOSIS — I480 Paroxysmal atrial fibrillation: Secondary | ICD-10-CM | POA: Diagnosis present

## 2020-12-12 DIAGNOSIS — D72829 Elevated white blood cell count, unspecified: Secondary | ICD-10-CM | POA: Diagnosis present

## 2020-12-12 DIAGNOSIS — Z830 Family history of human immunodeficiency virus [HIV] disease: Secondary | ICD-10-CM

## 2020-12-12 DIAGNOSIS — E876 Hypokalemia: Secondary | ICD-10-CM | POA: Diagnosis present

## 2020-12-12 DIAGNOSIS — K5730 Diverticulosis of large intestine without perforation or abscess without bleeding: Secondary | ICD-10-CM | POA: Diagnosis present

## 2020-12-12 LAB — COMPREHENSIVE METABOLIC PANEL
ALT: 10 U/L (ref 0–44)
AST: 15 U/L (ref 15–41)
Albumin: 2.1 g/dL — ABNORMAL LOW (ref 3.5–5.0)
Alkaline Phosphatase: 86 U/L (ref 38–126)
Anion gap: 9 (ref 5–15)
BUN: 41 mg/dL — ABNORMAL HIGH (ref 8–23)
CO2: 25 mmol/L (ref 22–32)
Calcium: 7.9 mg/dL — ABNORMAL LOW (ref 8.9–10.3)
Chloride: 104 mmol/L (ref 98–111)
Creatinine, Ser: 0.91 mg/dL (ref 0.44–1.00)
GFR, Estimated: >60 mL/min (ref >60)
Glucose, Bld: 141 mg/dL — ABNORMAL HIGH (ref 70–99)
Potassium: 3.4 mmol/L — ABNORMAL LOW (ref 3.5–5.1)
Sodium: 138 mmol/L (ref 135–145)
Total Bilirubin: 0.5 mg/dL (ref 0.3–1.2)
Total Protein: 6.8 g/dL (ref 6.5–8.1)

## 2020-12-12 LAB — CBC WITH DIFFERENTIAL/PLATELET
Abs Immature Granulocytes: 0.07 10*3/uL (ref 0.00–0.07)
Basophils Absolute: 0.1 10*3/uL (ref 0.0–0.1)
Basophils Relative: 1 %
Eosinophils Absolute: 0.2 10*3/uL (ref 0.0–0.5)
Eosinophils Relative: 2 %
HCT: 26.9 % — ABNORMAL LOW (ref 36.0–46.0)
Hemoglobin: 7.9 g/dL — ABNORMAL LOW (ref 12.0–15.0)
Immature Granulocytes: 1 %
Lymphocytes Relative: 21 %
Lymphs Abs: 2.4 10*3/uL (ref 0.7–4.0)
MCH: 24.7 pg — ABNORMAL LOW (ref 26.0–34.0)
MCHC: 29.4 g/dL — ABNORMAL LOW (ref 30.0–36.0)
MCV: 84.1 fL (ref 80.0–100.0)
Monocytes Absolute: 0.8 10*3/uL (ref 0.1–1.0)
Monocytes Relative: 7 %
Neutro Abs: 7.6 10*3/uL (ref 1.7–7.7)
Neutrophils Relative %: 68 %
Platelets: 262 10*3/uL (ref 150–400)
RBC: 3.2 MIL/uL — ABNORMAL LOW (ref 3.87–5.11)
RDW: 16.9 % — ABNORMAL HIGH (ref 11.5–15.5)
WBC: 11.2 10*3/uL — ABNORMAL HIGH (ref 4.0–10.5)
nRBC: 0 % (ref 0.0–0.2)

## 2020-12-12 LAB — FOLATE: Folate: 27.7 ng/mL (ref >5.9)

## 2020-12-12 LAB — IRON AND TIBC
Iron: 38 ug/dL (ref 28–170)
Saturation Ratios: 14 % (ref 10.4–31.8)
TIBC: 280 ug/dL (ref 250–450)
UIBC: 242 ug/dL

## 2020-12-12 LAB — HIV ANTIBODY (ROUTINE TESTING W REFLEX): HIV Screen 4th Generation wRfx: NONREACTIVE

## 2020-12-12 LAB — PROTIME-INR
INR: 3.4 — ABNORMAL HIGH (ref 0.8–1.2)
Prothrombin Time: 34.3 seconds — ABNORMAL HIGH (ref 11.4–15.2)

## 2020-12-12 LAB — ABO/RH: ABO/RH(D): A POS

## 2020-12-12 LAB — RESP PANEL BY RT-PCR (FLU A&B, COVID) ARPGX2
Influenza A by PCR: NEGATIVE
Influenza B by PCR: NEGATIVE
SARS Coronavirus 2 by RT PCR: NEGATIVE

## 2020-12-12 LAB — VITAMIN B12: Vitamin B-12: 308 pg/mL (ref 180–914)

## 2020-12-12 LAB — POC OCCULT BLOOD, ED: Fecal Occult Bld: POSITIVE — AB

## 2020-12-12 LAB — FERRITIN: Ferritin: 59 ng/mL (ref 11–307)

## 2020-12-12 MED ORDER — ACETAMINOPHEN 650 MG RE SUPP: 650.0000 mg | Freq: Four times a day (QID) | RECTAL | Status: DC | PRN

## 2020-12-12 MED ORDER — SENNOSIDES-DOCUSATE SODIUM 8.6-50 MG PO TABS: 1.0000 | ORAL_TABLET | Freq: Every evening | ORAL | Status: DC | PRN

## 2020-12-12 MED ORDER — ACETAMINOPHEN 325 MG PO TABS
650.0000 mg | ORAL_TABLET | Freq: Four times a day (QID) | ORAL | Status: DC | PRN
  Administered 2020-12-12 – 2020-12-13 (×2): 650 mg via ORAL
  Filled 2020-12-12 (×2): qty 2

## 2020-12-12 MED ORDER — LACTATED RINGERS IV SOLN: INTRAVENOUS | Status: DC

## 2020-12-12 NOTE — ED Provider Notes (Signed)
[facility] EMERGENCY DEPARTMENT Provider Note   CSN: [id number] Arrival date & time: 12/12/20  0809     History Chief Complaint  Patient presents with  . Vaginal Bleeding    Zoe Cobb is a 65 y.o. female.  65 year old female with history of a fib (on Coumadin, 4.6 on 4/26, held dose x 1 day), CHF, HTN, morbid obesity, presents with complaint of vaginal bleeding x2 days.  Patient reports gradually worsening, now with clots, using 24 Chux pads daily. Denies feeling weak, dizzy, SHOB, CP. States she does have pain across her low pelvis to umbilical area since yesterday. No prior transfusions but would accept blood if needed. Does not see GYN x 32 years.         Past Medical History:  Diagnosis Date  . CHF (congestive heart failure) (HCC)   . Heart failure (HCC)   . Hypertension   . Morbid obesity (HCC)   . Paroxysmal atrial fibrillation [facility])     Patient Active Problem List   Diagnosis Date Noted  . Monitoring for long-term anticoagulant use 03/17/2020  . Atrial fibrillation (HCC) 10/31/2018  . Long term (current) use of anticoagulants 10/05/2018  . Laboratory examination 10/02/2018  . Anticoagulated   . Peripheral edema   . Thrombocytopenia (HCC)   . Chronic diastolic (congestive) heart failure (HCC) 09/27/2016  . Swelling of left lower extremity 09/27/2016  . Paroxysmal atrial fibrillation (HCC) 09/27/2016  . AKI (acute kidney injury) (HCC) 09/27/2016  . Shortness of breath 08/28/2016  . Pressure injury of skin 08/28/2016  . Morbid obesity due to excess calories (HCC) 06/28/2015    History reviewed. No pertinent surgical history.   OB History   No obstetric history on file.     Family History  Problem Relation Age of Onset  . Hypertension Mother   . Diabetes Mother   . Stomach cancer Father   . Kidney disease Sister   . Diabetes Sister   . Diabetes Sister   . Heart attack Brother   . HIV/AIDS Brother   . Diabetes Brother      Social History   Tobacco Use  . Smoking status: Former Smoker    Packs/day: 1.00    Years: 9.00    Pack years: 9.00    Types: Cigarettes    Quit date: 1996    Years since quitting: 26.3  . Smokeless tobacco: Never Used  Vaping Use  . Vaping Use: Never used  Substance Use Topics  . Alcohol use: No    Alcohol/week: 0.0 standard drinks  . Drug use: No    Home Medications Prior to Admission medications   Medication Sig Start Date End Date Taking? Authorizing Provider  cholecalciferol (VITAMIN D) 1000 units tablet Take 1,000 Units by mouth daily.    [provider]  metoprolol succinate (TOPROL-XL) 50 MG 24 hr tablet Take 1 tablet by mouth daily. 09/05/18   [provider]  polyethylene glycol (MIRALAX / GLYCOLAX) packet Take 17 g by mouth daily as needed for mild constipation. 08/30/16   Almon Hercules, MD  torsemide (DEMADEX) 20 MG tablet Take 3 tablets (60 mg total) by mouth daily. 10/19/16   Mayo, Allyn Kenner, MD  vitamin B-12 (CYANOCOBALAMIN) 1000 MCG tablet Take 1,000 mcg by mouth daily.    [provider]  warfarin (COUMADIN) 5 MG tablet Take 1 tablet (5 mg total) by mouth daily at 4 PM. Refer to most recent anticoagulation note for most updated dosing instructions. Patient taking  differently: Take 5 mg by mouth daily at 4 PM. 2.5 mg every Tue, Thu, Sat; 5 mg all other days. Refer to most recent anticoagulation note for most updated dosing instructions. 04/09/20   Yates Decamp, MD    Allergies    Penicillins  Review of Systems   Review of Systems  Constitutional: Negative for fever.  Respiratory: Negative for shortness of breath.   Cardiovascular: Negative for chest pain.  Gastrointestinal: Positive for abdominal pain. Negative for nausea and vomiting.  Genitourinary: Positive for vaginal bleeding.  Musculoskeletal: Negative for back pain.  Skin: Negative for rash and wound.  Allergic/Immunologic: Negative for immunocompromised state.   Neurological: Negative for dizziness, weakness and light-headedness.  Hematological: Bruises/bleeds easily.  All other systems reviewed and are negative.   Physical Exam Updated Vital Signs BP 102/74   Pulse 86   Temp 98.3 F (36.8 C) (Oral)   Resp 20   Ht 5\' 1"  (1.549 m)   Wt (!) 204.1 kg   SpO2 98%   BMI 85.03 kg/m   Physical Exam Vitals and nursing note reviewed. Exam conducted with a chaperone present.  Constitutional:      General: She is not in acute distress.    Appearance: She is well-developed. She is obese. She is not diaphoretic.  HENT:     Head: Normocephalic and atraumatic.  Cardiovascular:     Rate and Rhythm: Normal rate and regular rhythm.     Heart sounds: Normal heart sounds.  Pulmonary:     Effort: Pulmonary effort is normal.     Breath sounds: Normal breath sounds.  Abdominal:     Palpations: Abdomen is soft.     Tenderness: There is no abdominal tenderness.  Genitourinary:    Vagina: No bleeding.     Rectum: Guaiac result positive.  Skin:    General: Skin is warm and dry.     Findings: No erythema or rash.  Neurological:     Mental Status: She is alert and oriented to person, place, and time.  Psychiatric:        Behavior: Behavior normal.     ED Results / Procedures / Treatments   Labs (all labs ordered are listed, but only abnormal results are displayed) Labs Reviewed  CBC WITH DIFFERENTIAL/PLATELET - Abnormal; Notable for the following components:      Result Value   WBC 11.2 (*)    RBC 3.20 (*)    Hemoglobin 7.9 (*)    HCT 26.9 (*)    MCH 24.7 (*)    MCHC 29.4 (*)    RDW 16.9 (*)    All other components within normal limits  COMPREHENSIVE METABOLIC PANEL - Abnormal; Notable for the following components:   Potassium 3.4 (*)    Glucose, Bld 141 (*)    BUN 41 (*)    Calcium 7.9 (*)    Albumin 2.1 (*)    All other components within normal limits  PROTIME-INR - Abnormal; Notable for the following components:   Prothrombin Time  34.3 (*)    INR 3.4 (*)    All other components within normal limits  POC OCCULT BLOOD, ED - Abnormal; Notable for the following components:   Fecal Occult Bld POSITIVE (*)    All other components within normal limits  RESP PANEL BY RT-PCR (FLU A&B, COVID) ARPGX2  TYPE AND SCREEN    EKG None  Radiology No results found.  Procedures .Critical Care Performed by: , PA-C Authorized by: Jeannie Fend  A, PA-C   Critical care provider statement:    Critical care time (minutes):  45   Critical care was time spent personally by me on the following activities:  Discussions with consultants, evaluation of patient's response to treatment, examination of patient, ordering and performing treatments and interventions, ordering and review of laboratory studies, ordering and review of radiographic studies, pulse oximetry, re-evaluation of patient's condition, obtaining history from patient or surrogate and review of old charts     Medications Ordered in ED Medications - No data to display  ED Course  I have reviewed the triage vital signs and the nursing notes.  Pertinent labs & imaging results that were available during my care of the patient were reviewed by me and considered in my medical decision making (see chart for details).  Clinical Course as of 12/12/20 1117  Sun Dec 12, 2020  2448 65 year old female presents from home with complaint of vaginal bleeding.  Patient is morbidly obese, states that her 2 health aides at home have been managing what they believed to be vaginal bleeding with Chux pads, states that she has used 24 Chux pads, is passing blood with clots.  Believes her stools to be normal.  States that she does have pain across her lower abdomen which radiates up to her umbilical area, denies fevers, nausea, vomiting.  Exam is complicated by patient's body habitus, no blood in the vagina on speculum exam, does have dark red blood around her rectum which is  Hemoccult positive with a small amount of stool present.  Patient is on Coumadin for A. fib, prior INR was elevated at 4.6 and she was advised to hold x1 dose.  Repeat INR today is 3.4. Patient's hemoglobin is 7.9, hematocrit 26.9, prior hemoglobin for years ago on file of 9.3. CMP with normal creatinine, BUN is increased to 41, no other significant findings.  Plan was to proceed with CT abdomen pelvis for complaint of abdominal pain with GI bleed however radiology has assessed patient's body habitus and she will not fit in the CT scanner today.  Plan is to consult GI for GI bleed on Coumadin with hospitalist team to admit.  Patient was mildly tachycardic on arrival, heart rate currently is 86 with blood pressure of 102/74.  Blood pressures have been stable. [LM]  1047 Case discussed with internal medicine teaching service who will consult for admission. [LM]  1114 Case discussed with Doug Sou, PA-C with [facility] who will consult.  [LM]    Clinical Course User Index [LM] Alden Hipp   MDM Rules/Calculators/A&P                          Final Clinical Impression(s) / ED Diagnoses Final diagnoses:  Gastrointestinal hemorrhage, unspecified gastrointestinal hemorrhage type  Lower abdominal pain    Rx / DC Orders ED Discharge Orders    None       Jeannie Fend, PA-C 12/12/20 1117    Arby Barrette, MD 12/12/20 1228

## 2020-12-12 NOTE — H&P (Addendum)
Date: 12/12/2020               Patient Name:  Zoe Cobb MRN: [id number]  DOB: [birth year] Age / Sex: 65 y.o., female   PCP: Marletta Lor, NP              Medical Service: Internal Medicine Teaching Service              Attending Physician: Dr. Gust Rung, DO    First Contact: Janeal Holmes, MS3 Pager: [phone]  Second Contact: Dr. Karsten Ro, MD Pager: [phone]  Third Contact Dr. Dolan Amen, MD Pager: [phone]       After Hours (After 5p/  First Contact Pager: [phone]  weekends / holidays): Second Contact Pager: [phone]   Chief Complaint: Vaginal bleeding  History of Present Illness:  Zoe Cobb is a 65 y.o. female with a past medical history of atrial fibrillation, CHF, HTN, and morbid obesity who presents with complaints of vaginal bleeding and clotting for the last 2 days. She also endorses some abdominal pain "around the middle" of her abdomen and around her "pelvic bone" which started 1 day ago. Her bleeding "slows up then continues bleeding." She says that most of the time it has been strong bleeding. She has used 24 Chux pads daily due to heavy bleeding. Her abdominal pain is rated as 8/10 in intensity. She states the pain is sharp in character. She has not tried anything to alleviate her pain. Her pain does not radiate. The pain has been stable since it began 1 day ago. She has not experienced symptoms like this before.   Patient denies any fever, chills, nausea, vomiting, constipation, diarrhea, dysuria, urinary frequency, or urine color changes. There has been no melena or bright red blood in her stool and no history of hemorrhoids, anal tears, or anal fissures. She has not experienced any weakness, dizziness, SOB, or chest pain with her symptoms. She last saw a gynecologist 32 years ago. She has never seen a GI doctor in the past or had a colonoscopy. She states that she takes warfarin daily for atrial fibrillation.   ED Course: Patient was mildly  tachycardic on arrival but pulse decreased to 86 with BP of 102/74 over time. Exam was limited due to increased body habitus. Speculum exam demonstrated no blood in vagina. Dark red blood was appreciated around rectum with stool. Labs were notable for mild leukocytosis with WBC 11.2, normocytic anemia with Hgb at 7.9, mild hypokalemia, and mild hypocalcemia. Patient also had hyperglycemia, hypoalbuminemia, BUN increased at 41, normal Cr of 0.91, INR of 3.4, and positive FOBT. Attempted to proceed with CT abdomen and pelvis but patient was not able to fit into scanner. Consulted Doug Sou, PA-C with [facility].   Meds: Current Outpatient Medications  Medication Instructions  . cholecalciferol (VITAMIN D) 1,000 Units, Oral, Daily  . isosorbide mononitrate (IMDUR) 60 mg, Oral, Daily  . metoprolol succinate (TOPROL-XL) 50 mg, Oral, Daily  . polyethylene glycol (MIRALAX / GLYCOLAX) 17 g, Oral, Daily PRN  . torsemide (DEMADEX) 60 mg, Oral, Daily  . traMADol (ULTRAM) 50 mg, Oral, Every 8 hours PRN  . vitamin B-12 (CYANOCOBALAMIN) 1,000 mcg, Oral, Daily  . warfarin (COUMADIN) 5 mg, Oral, Daily-1600, Refer to most recent anticoagulation note for most updated dosing instructions.   Allergies: Allergies as of 12/12/2020 - Review Complete 12/12/2020  Allergen Reaction Noted  . Penicillins Swelling 06/28/2015   Past Medical History:  Diagnosis Date  . CHF (congestive heart failure) (  HCC)   . Heart failure (HCC)   . Hypertension   . Morbid obesity (HCC)   . Paroxysmal atrial fibrillation (HCC)    Family History: Mother, deceased; HTN, diabetes, death by MI Father, deceased; death by stomach cancer Sister, living; diabetes Sister, living; diabetes Sister, deceased Brother, deceased; MI Brother, deceased; HIV/AIDS Brother, deceased; diabetes Brother, living  Social History: Patient lives in [place]. She is a previous cigarette user. She quit 30 years ago after smoking half of a pack a  day since she was a teenager. She reports drinking about 2 alcoholic beverages a year. She previously used marijuana.  Review of Systems: A complete ROS was negative except as per HPI.  Physical Exam: Blood pressure 99/85, pulse 86, temperature 98.3 F (36.8 C), temperature source Oral, resp. rate 18, height 5\' 1"  (1.549 m), weight (!) 204.1 kg, SpO2 99 %.  Physical Exam Constitutional:      Appearance: Normal appearance. She is obese.  HENT:     Head: Normocephalic and atraumatic.  Eyes:     Extraocular Movements: Extraocular movements intact.  Cardiovascular:     Rate and Rhythm: Normal rate and regular rhythm.     Pulses: Normal pulses.     Heart sounds: Normal heart sounds.  Pulmonary:     Effort: Pulmonary effort is normal.     Breath sounds: Normal breath sounds.  Abdominal:     General: Abdomen is flat. Bowel sounds are normal.     Palpations: Abdomen is soft.     Tenderness: There is abdominal tenderness in the periumbilical area and suprapubic area.  Musculoskeletal:        General: Normal range of motion.     Cervical back: Normal range of motion and neck supple.  Skin:    General: Skin is warm and dry.  Neurological:     General: No focal deficit present.     Mental Status: She is alert and oriented to person, place, and time.  Psychiatric:        Mood and Affect: Mood normal.        Behavior: Behavior normal.        Thought Content: Thought content normal.        Judgment: Judgment normal.    Laboratory Results: Recent Results (from the past 2160 hour(s))  POCT INR     Status: None   Collection Time: 10/12/20 10:13 AM  Result Value Ref Range   INR 2.7 2.0 - 3.0  POCT INR     Status: Abnormal   Collection Time: 11/30/20 10:42 AM  Result Value Ref Range   INR 4.5 (A) 2.0 - 3.0  CBC with Differential     Status: Abnormal   Collection Time: 12/12/20  8:50 AM  Result Value Ref Range   WBC 11.2 (H) 4.0 - 10.5 K/uL   RBC 3.20 (L) 3.87 - 5.11 MIL/uL    Hemoglobin 7.9 (L) 12.0 - 15.0 g/dL   HCT 08.1 (L) 44.8 - 18.5 %   MCV 84.1 80.0 - 100.0 fL   MCH 24.7 (L) 26.0 - 34.0 pg   MCHC 29.4 (L) 30.0 - 36.0 g/dL   RDW 63.1 (H) 49.7 - 02.6 %   Platelets 262 150 - 400 K/uL   nRBC 0.0 0.0 - 0.2 %   Neutrophils Relative % 68 %   Neutro Abs 7.6 1.7 - 7.7 K/uL   Lymphocytes Relative 21 %   Lymphs Abs 2.4 0.7 - 4.0 K/uL   Monocytes Relative  7 %   Monocytes Absolute 0.8 0.1 - 1.0 K/uL   Eosinophils Relative 2 %   Eosinophils Absolute 0.2 0.0 - 0.5 K/uL   Basophils Relative 1 %   Basophils Absolute 0.1 0.0 - 0.1 K/uL   Immature Granulocytes 1 %   Abs Immature Granulocytes 0.07 0.00 - 0.07 K/uL    Comment: Performed at [facility], [address] [place], [place] [address]  Comprehensive metabolic panel     Status: Abnormal   Collection Time: 12/12/20  8:50 AM  Result Value Ref Range   Sodium 138 135 - 145 mmol/L   Potassium 3.4 (L) 3.5 - 5.1 mmol/L   Chloride 104 98 - 111 mmol/L   CO2 25 22 - 32 mmol/L   Glucose, Bld 141 (H) 70 - 99 mg/dL    Comment: Glucose reference range applies only to samples taken after fasting for at least 8 hours.   BUN 41 (H) 8 - 23 mg/dL   Creatinine, Ser 0.53 0.44 - 1.00 mg/dL   Calcium 7.9 (L) 8.9 - 10.3 mg/dL   Total Protein 6.8 6.5 - 8.1 g/dL   Albumin 2.1 (L) 3.5 - 5.0 g/dL   AST 15 15 - 41 U/L   ALT 10 0 - 44 U/L   Alkaline Phosphatase 86 38 - 126 U/L   Total Bilirubin 0.5 0.3 - 1.2 mg/dL   GFR, Estimated >97 >67 mL/min    Comment: (NOTE) Calculated using the CKD-EPI Creatinine Equation (2021)    Anion gap 9 5 - 15    Comment: Performed at [facility], [address], [place], [place] [address]  Protime-INR     Status: Abnormal   Collection Time: 12/12/20  8:50 AM  Result Value Ref Range   Prothrombin Time 34.3 (H) 11.4 - 15.2 seconds   INR 3.4 (H) 0.8 - 1.2    Comment: (NOTE) INR goal varies based on device and disease states. Performed at [facility], [address],  [place], [place] [address]   Type and screen [facility]     Status: None   Collection Time: 12/12/20  8:50 AM  Result Value Ref Range   ABO/RH(D) A POS    Antibody Screen NEG    Sample Expiration      12/15/2020,2359 Performed at [facility], [address], [place], [place] [address]   POC occult blood, ED     Status: Abnormal   Collection Time: 12/12/20 10:23 AM  Result Value Ref Range   Fecal Occult Bld POSITIVE (A) NEGATIVE   Assessment & Plan by Problem: Active Problems:   GI bleed  1. GI bleed Patient is in NAD and is HDS. Mild leukocytosis with WBC 11.2 and normocytic anemia with Hgb at 7.9 present. Mild hypokalemia and hypocalcemia. Patient also presents with hyperglycemia, hypoalbuminemia, BUN increased at 41, normal Cr of 0.91. INR is 3.4. FOBT is positive. Unable to get CT due to increased body habitus. Dark blood around rectum and no vaginal bleeding on exam, etiology likely gastrointestinal in nature. Differential at this point is colon mass vs. angiodysplasia vs. diverticular vs. hemorrhoids. -Admit for further observation and evaluation -[facility] consulted, appreciate assistance -NPO until after any procedures -IV LR 100 mL/hr -Senokot 1 tablet at bedtime prn, tylenol 650 mg suppository/oral prn -HIV antibody, BMP, CBC, protime-INR, APTT ordered -Tramadol 50 mg Q8H prn for pain, imdur 60 mg once daily, vit B12 1000 mcg once daily, vit  D 1000 units once daily, toprol 50 mg once daily to be resumed after any procedures  2. HTN Patient's BP is 99/85 and stable. Hypokalemia and hypocalcemia present. -Hold home torsemide 20 mg   3. Atrial fibrillation Patient's rate and rhythm regular on exam. -Hold warfarin in setting of GI bleed and pending procedures  4. CHF Patient is euvolemic on exam. BP slightly low. Hypokalemia and hypocalcemia present. No increased work of breathing or LE edema. -Hold home torsemide 20 mg  Dispo: Admit patient to  Observation with expected length of stay less than 2 midnights.  Signed: Samantha Crimes, Medical Student 12/12/2020, 2:56 PM  Pager: [phone] After 5pm on weekdays and 1pm on weekends: On Call pager [phone]  Attestation for Student Documentation:  I personally was present and performed or re-performed the history, physical exam and medical decision-making activities of this service and have verified that the service and findings are accurately documented in the student's note.  Karsten Ro, MD 12/13/2020, 4:10 PM

## 2020-12-12 NOTE — Progress Notes (Signed)
   12/12/20 2127  Assess: MEWS Score  BP (!) 98/52  Pulse Rate (!) 116  ECG Heart Rate (!) 116  Resp 16  SpO2 99 %  Assess: MEWS Score  MEWS Temp 0  MEWS Systolic 1  MEWS Pulse 2  MEWS RR 0  MEWS LOC 0  MEWS Score 3  MEWS Score Color Yellow  Assess: if the MEWS score is Yellow or Red  Were vital signs taken at a resting state? Yes  Focused Assessment No change from prior assessment  Early Detection of Sepsis Score *See Row Information* Low  MEWS guidelines implemented *See Row Information* Yes  Take Vital Signs  Increase Vital Sign Frequency  Yellow: Q 2hr X 2 then Q 4hr X 2, if remains yellow, continue Q 4hrs  Escalate  MEWS: Escalate Yellow: discuss with charge nurse/RN and consider discussing with provider and RRT  Notify: Charge Nurse/RN  Name of Charge Nurse/RN Notified Mindy, M, RN  Date Charge Nurse/RN Notified 12/12/20  Time Charge Nurse/RN Notified 2130  Notify: Provider  Provider Name/Title Laural Benes, MD  Date Provider Notified 12/12/20  Time Provider Notified 2150  Notification Type Page  Notification Reason Other (Comment) (MEWS yellow)  Pt mews score yellow due to elevated HR and systolic BP being low, pt in no distress, no chest pain or SOB, mental status unchanged.  Laural Benes, MD responded at 2206. No new orders at this time, will continue to monitor pt and notify provider of any new findings as requested.

## 2020-12-12 NOTE — Progress Notes (Signed)
IVT consulted for difficult stick.  There is currently not an order for a PIV, IV meds, or a procedure in which an IV is indicated.  RN acknowledged and will put in consult if necessary after an assessment of vasculature status.

## 2020-12-12 NOTE — Consult Note (Addendum)
Attending physician's note   I have taken an interval history, reviewed the chart and examined the patient. I agree with the Advanced Practitioner's note, impression, and recommendations as outlined.   65 year old female with medical history as outlined below presents with what she initially thought was vaginal bleeding, but ER evaluation negative for vaginal bleeding but did note dried blood around anus.    Admission work-up notable for the following: - H/H 7.9/26.9 with MCV 84.  Only comparison lab was from 2018 when it was 9.3/30 with MCV 85.   -INR 3.4 - FOBT positive - BUN/creatinine 41/0.9, otherwise normal BMP - Albumin 2.1, otherwise normal liver enzymes  1) Hematochezia 2) History of atrial fibrillation 3) Chronic Coumadin 4) Morbid obesity (BMI 85) 5) Normocytic anemia 6) Lower abdominal cramping  - Not entirely clear what her baseline H/H is as there are no recent labs for review.  Proceed with anemia work-up to include iron panel, B12, folate - Continue H/H trend - Continue observing for e/o GI bleeding - Hold Coumadin - Last dose of Coumadin was 5/5.  Given hemodynamic stability, do not think this needs to be reversed acutely, but rather allow INR to drift down - Plan for clears and bowel prep starting tomorrow, with tentative plan for colonoscopy on Wednesday if she has continued bleeding and when INR <1.8 to allow improved therapeutics - If iron deficiency, plan for EGD at that same time  The indications, risks, and benefits of colonoscopy and EGD were explained to the patient in detail. Risks include but are not limited to bleeding, perforation, adverse reaction to medications, and cardiopulmonary compromise. Sequelae include but are not limited to the possibility of surgery, hospitalization, and mortality. The patient verbalized understanding and wished to proceed pending work-up as above. All questions answered.   Ann Held, [place] [phone] office             Referring Provider: Army Melia, PA-C in the ED Primary Care Physician:  Marletta Lor, NP Primary Gastroenterologist:  Gentry Fitz  Reason for Consultation:  GI bleed  HPI: Zoe Cobb is a 65 y.o. female with a past medical history of atrial fibrillation on coumadin, CHF, HTN, and morbid obesity who presents with complaints of what she thought was vaginal bleeding and clotting.  She also reports pain in her pelvic area.  She says that this just came out of nowhere yesterday.  When she go to the ED a pelvic/speculum exam revealed no vaginal bleeding.  She had dried blood around her anus.  Was heme positive on rectal exam.  She has never undergone colonoscopy in the past.    INR is 3.4, Hgb is 7.9 grams.  Only for comparison is 4 years ago when it was 9.3 grams.  MCV is normal at 84.  Unfortunately she cannot fit into the CT scanner due to her size.   Past Medical History:  Diagnosis Date  . CHF (congestive heart failure) (HCC)   . Heart failure (HCC)   . Hypertension   . Morbid obesity (HCC)   . Paroxysmal atrial fibrillation (HCC)     History reviewed. No pertinent surgical history.  Prior to Admission medications   Medication Sig Start Date End Date Taking? Authorizing Provider  cholecalciferol (VITAMIN D) 1000 units tablet Take 1,000 Units by mouth daily.    [provider]  metoprolol succinate (TOPROL-XL) 50 MG 24 hr tablet Take 1 tablet by mouth daily. 09/05/18   [provider]  polyethylene glycol (MIRALAX /  GLYCOLAX) packet Take 17 g by mouth daily as needed for mild constipation. 08/30/16   Almon Hercules, MD  torsemide (DEMADEX) 20 MG tablet Take 3 tablets (60 mg total) by mouth daily. 10/19/16   Mayo, Allyn Kenner, MD  vitamin B-12 (CYANOCOBALAMIN) 1000 MCG tablet Take 1,000 mcg by mouth daily.    [provider]  warfarin (COUMADIN) 5 MG tablet Take 1 tablet (5 mg total) by mouth daily at 4 PM. Refer to most recent anticoagulation note  for most updated dosing instructions. Patient taking differently: Take 5 mg by mouth daily at 4 PM. 2.5 mg every Tue, Thu, Sat; 5 mg all other days. Refer to most recent anticoagulation note for most updated dosing instructions. 04/09/20   Yates Decamp, MD    Current Facility-Administered Medications  Medication Dose Route Frequency Provider Last Rate Last Admin  . acetaminophen (TYLENOL) tablet 650 mg  650 mg Oral Q6H PRN Dolan Amen, MD       Or  . acetaminophen (TYLENOL) suppository 650 mg  650 mg Rectal Q6H PRN Dolan Amen, MD      . senna-docusate (Senokot-S) tablet 1 tablet  1 tablet Oral QHS PRN Dolan Amen, MD       Current Outpatient Medications  Medication Sig Dispense Refill  . cholecalciferol (VITAMIN D) 1000 units tablet Take 1,000 Units by mouth daily.    . metoprolol succinate (TOPROL-XL) 50 MG 24 hr tablet Take 1 tablet by mouth daily.    . polyethylene glycol (MIRALAX / GLYCOLAX) packet Take 17 g by mouth daily as needed for mild constipation. 14 each 0  . torsemide (DEMADEX) 20 MG tablet Take 3 tablets (60 mg total) by mouth daily. 90 tablet 0  . vitamin B-12 (CYANOCOBALAMIN) 1000 MCG tablet Take 1,000 mcg by mouth daily.    Marland Kitchen warfarin (COUMADIN) 5 MG tablet Take 1 tablet (5 mg total) by mouth daily at 4 PM. Refer to most recent anticoagulation note for most updated dosing instructions. (Patient taking differently: Take 5 mg by mouth daily at 4 PM. 2.5 mg every Tue, Thu, Sat; 5 mg all other days. Refer to most recent anticoagulation note for most updated dosing instructions.) 90 tablet 3    Allergies as of 12/12/2020 - Review Complete 12/12/2020  Allergen Reaction Noted  . Penicillins Swelling 06/28/2015    Family History  Problem Relation Age of Onset  . Hypertension Mother   . Diabetes Mother   . Stomach cancer Father   . Kidney disease Sister   . Diabetes Sister   . Diabetes Sister   . Heart attack Brother   . HIV/AIDS Brother   . Diabetes Brother      Social History   Socioeconomic History  . Marital status: Married    Spouse name: Not on file  . Number of children: 2  . Years of education: Not on file  . Highest education level: Not on file  Occupational History  . Not on file  Tobacco Use  . Smoking status: Former Smoker    Packs/day: 1.00    Years: 9.00    Pack years: 9.00    Types: Cigarettes    Quit date: 1996    Years since quitting: 26.3  . Smokeless tobacco: Never Used  Vaping Use  . Vaping Use: Never used  Substance and Sexual Activity  . Alcohol use: No    Alcohol/week: 0.0 standard drinks  . Drug use: No  . Sexual activity: Not on file  Other Topics  Concern  . Not on file  Social History Narrative   Pt lives by herself, she completed hs.    Social Determinants of Health   Financial Resource Strain: Not on file  Food Insecurity: Not on file  Transportation Needs: Not on file  Physical Activity: Not on file  Stress: Not on file  Social Connections: Not on file  Intimate Partner Violence: Not on file    Review of Systems: ROS is O/W negative except as mentioned in HPI.  Physical Exam: Vital signs in last 24 hours: Temp:  [98.3 F (36.8 C)] 98.3 F (36.8 C) (05/08 0820) Pulse Rate:  [81-114] 86 (05/08 1200) Resp:  [16-20] 18 (05/08 1200) BP: (94-113)/(42-85) 99/85 (05/08 1200) SpO2:  [98 %-100 %] 99 % (05/08 1200) Weight:  [204.1 kg] 204.1 kg (05/08 0817)   General:  Alert, obese, pleasant and cooperative in NAD Head:  Normocephalic and atraumatic. Eyes:  Sclera clear, no icterus.  Conjunctiva pink. Ears:  Normal auditory acuity. Mouth:  No deformity or lesions.   Lungs:  Clear throughout to auscultation.  No wheezes, crackles, or rhonchi.  Heart:  Regular rate and rhythm; no murmurs, clicks, rubs,or gallops. Abdomen:  Soft, non-distended.  BS present.  Lower abdominal TTP. Rectal:  Deferred.  Was performed in the ED.  Msk:  Symmetrical without gross deformities. Pulses:  Normal pulses  noted. Extremities:  Without clubbing or edema. Neurologic:  Alert and oriented x 4;  grossly normal neurologically. Skin:  Intact without significant lesions or rashes. Psych:  Alert and cooperative. Normal mood and affect.  Lab Results: Recent Labs    12/12/20 0850  WBC 11.2*  HGB 7.9*  HCT 26.9*  PLT 262   BMET Recent Labs    12/12/20 0850  NA 138  K 3.4*  CL 104  CO2 25  GLUCOSE 141*  BUN 41*  CREATININE 0.91  CALCIUM 7.9*   LFT Recent Labs    12/12/20 0850  PROT 6.8  ALBUMIN 2.1*  AST 15  ALT 10  ALKPHOS 86  BILITOT 0.5   PT/INR Recent Labs    12/12/20 0850  LABPROT 34.3*  INR 3.4*   IMPRESSION:  *GI bleed:  Suspected lower bleed as she initially thought the bleeding was vaginal in nature and she is having some lower abdominal/pelvis pain.  Cannot obtain CT scan due to size.   *Normocytic anemia:  Hgb is 7.9 grams.  Only comparison is 4 years ago at 9.3 grams.  MCV is normal. *Chronic anticoagulation with coumadin for history of atrial fibrillation:  INR 3.4 here today.  PLAN: -Will plan to let her INR drift down. -Will proceed with colonoscopy possibly Tuesday, if not then Wednesday. -She is asking to eat tonight so I will allow her to have a soft diet then clear liquids starting tomorrow morning. -Monitor Hgb and transfuse if needed. -Will check iron studies, Vitamin B12 levels, and folate.  Princella Pellegrini. Zehr  12/12/2020, 12:35 PM

## 2020-12-12 NOTE — ED Triage Notes (Signed)
Pt BIB [facility] from home, pt c/o heavy, consistent vaginal bleeding since Friday. Pt states she has gone through 24 chux per day. Pt has MS, takes warfarin. Pt endorses lower abdominal pain. Denies V/D.

## 2020-12-13 DIAGNOSIS — I959 Hypotension, unspecified: Secondary | ICD-10-CM | POA: Diagnosis present

## 2020-12-13 DIAGNOSIS — Z20822 Contact with and (suspected) exposure to covid-19: Secondary | ICD-10-CM | POA: Diagnosis present

## 2020-12-13 DIAGNOSIS — I110 Hypertensive heart disease with heart failure: Secondary | ICD-10-CM | POA: Diagnosis present

## 2020-12-13 DIAGNOSIS — Z7901 Long term (current) use of anticoagulants: Secondary | ICD-10-CM | POA: Diagnosis not present

## 2020-12-13 DIAGNOSIS — D696 Thrombocytopenia, unspecified: Secondary | ICD-10-CM | POA: Diagnosis present

## 2020-12-13 DIAGNOSIS — D649 Anemia, unspecified: Secondary | ICD-10-CM | POA: Diagnosis not present

## 2020-12-13 DIAGNOSIS — D6832 Hemorrhagic disorder due to extrinsic circulating anticoagulants: Secondary | ICD-10-CM | POA: Diagnosis present

## 2020-12-13 DIAGNOSIS — Z87891 Personal history of nicotine dependence: Secondary | ICD-10-CM | POA: Diagnosis not present

## 2020-12-13 DIAGNOSIS — Z833 Family history of diabetes mellitus: Secondary | ICD-10-CM | POA: Diagnosis not present

## 2020-12-13 DIAGNOSIS — Z8249 Family history of ischemic heart disease and other diseases of the circulatory system: Secondary | ICD-10-CM | POA: Diagnosis not present

## 2020-12-13 DIAGNOSIS — E876 Hypokalemia: Secondary | ICD-10-CM | POA: Diagnosis present

## 2020-12-13 DIAGNOSIS — I5032 Chronic diastolic (congestive) heart failure: Secondary | ICD-10-CM | POA: Diagnosis present

## 2020-12-13 DIAGNOSIS — Z830 Family history of human immunodeficiency virus [HIV] disease: Secondary | ICD-10-CM | POA: Diagnosis not present

## 2020-12-13 DIAGNOSIS — D72829 Elevated white blood cell count, unspecified: Secondary | ICD-10-CM | POA: Diagnosis present

## 2020-12-13 DIAGNOSIS — Z6841 Body Mass Index (BMI) 40.0 and over, adult: Secondary | ICD-10-CM | POA: Diagnosis not present

## 2020-12-13 DIAGNOSIS — R739 Hyperglycemia, unspecified: Secondary | ICD-10-CM | POA: Diagnosis present

## 2020-12-13 DIAGNOSIS — D62 Acute posthemorrhagic anemia: Secondary | ICD-10-CM | POA: Diagnosis present

## 2020-12-13 DIAGNOSIS — R1030 Lower abdominal pain, unspecified: Secondary | ICD-10-CM | POA: Diagnosis not present

## 2020-12-13 DIAGNOSIS — T45515A Adverse effect of anticoagulants, initial encounter: Secondary | ICD-10-CM | POA: Diagnosis present

## 2020-12-13 DIAGNOSIS — Z800 Family history of malignant neoplasm of digestive organs: Secondary | ICD-10-CM | POA: Diagnosis not present

## 2020-12-13 DIAGNOSIS — E8809 Other disorders of plasma-protein metabolism, not elsewhere classified: Secondary | ICD-10-CM | POA: Diagnosis present

## 2020-12-13 DIAGNOSIS — I480 Paroxysmal atrial fibrillation: Secondary | ICD-10-CM | POA: Diagnosis present

## 2020-12-13 DIAGNOSIS — K921 Melena: Secondary | ICD-10-CM | POA: Diagnosis not present

## 2020-12-13 DIAGNOSIS — K5730 Diverticulosis of large intestine without perforation or abscess without bleeding: Secondary | ICD-10-CM | POA: Diagnosis not present

## 2020-12-13 DIAGNOSIS — K922 Gastrointestinal hemorrhage, unspecified: Secondary | ICD-10-CM | POA: Diagnosis present

## 2020-12-13 DIAGNOSIS — Z841 Family history of disorders of kidney and ureter: Secondary | ICD-10-CM | POA: Diagnosis not present

## 2020-12-13 DIAGNOSIS — K5731 Diverticulosis of large intestine without perforation or abscess with bleeding: Secondary | ICD-10-CM | POA: Diagnosis present

## 2020-12-13 DIAGNOSIS — Z79899 Other long term (current) drug therapy: Secondary | ICD-10-CM | POA: Diagnosis not present

## 2020-12-13 LAB — CBC
HCT: 21.9 % — ABNORMAL LOW (ref 36.0–46.0)
HCT: 25.4 % — ABNORMAL LOW (ref 36.0–46.0)
HCT: 25.1 % — ABNORMAL LOW (ref 36.0–46.0)
Hemoglobin: 6.8 g/dL — CL (ref 12.0–15.0)
Hemoglobin: 7.9 g/dL — ABNORMAL LOW (ref 12.0–15.0)
Hemoglobin: 7.4 g/dL — ABNORMAL LOW (ref 12.0–15.0)
MCH: 25.1 pg — ABNORMAL LOW (ref 26.0–34.0)
MCH: 25.6 pg — ABNORMAL LOW (ref 26.0–34.0)
MCH: 25.2 pg — ABNORMAL LOW (ref 26.0–34.0)
MCHC: 31.1 g/dL (ref 30.0–36.0)
MCHC: 31.1 g/dL (ref 30.0–36.0)
MCHC: 29.5 g/dL — ABNORMAL LOW (ref 30.0–36.0)
MCV: 80.8 fL (ref 80.0–100.0)
MCV: 82.5 fL (ref 80.0–100.0)
MCV: 85.4 fL (ref 80.0–100.0)
Platelets: 249 10*3/uL (ref 150–400)
Platelets: 260 10*3/uL (ref 150–400)
Platelets: 249 10*3/uL (ref 150–400)
RBC: 2.71 MIL/uL — ABNORMAL LOW (ref 3.87–5.11)
RBC: 3.08 MIL/uL — ABNORMAL LOW (ref 3.87–5.11)
RBC: 2.94 MIL/uL — ABNORMAL LOW (ref 3.87–5.11)
RDW: 17.1 % — ABNORMAL HIGH (ref 11.5–15.5)
RDW: 17.2 % — ABNORMAL HIGH (ref 11.5–15.5)
RDW: 17.3 % — ABNORMAL HIGH (ref 11.5–15.5)
WBC: 10.3 10*3/uL (ref 4.0–10.5)
WBC: 9.5 10*3/uL (ref 4.0–10.5)
WBC: 11.9 10*3/uL — ABNORMAL HIGH (ref 4.0–10.5)
nRBC: 0 % (ref 0.0–0.2)
nRBC: 0.2 % (ref 0.0–0.2)
nRBC: 0.2 % (ref 0.0–0.2)

## 2020-12-13 LAB — BASIC METABOLIC PANEL
Anion gap: 6 (ref 5–15)
Anion gap: 8 (ref 5–15)
BUN: 30 mg/dL — ABNORMAL HIGH (ref 8–23)
BUN: 24 mg/dL — ABNORMAL HIGH (ref 8–23)
CO2: 26 mmol/L (ref 22–32)
CO2: 22 mmol/L (ref 22–32)
Calcium: 8 mg/dL — ABNORMAL LOW (ref 8.9–10.3)
Calcium: 7.7 mg/dL — ABNORMAL LOW (ref 8.9–10.3)
Chloride: 106 mmol/L (ref 98–111)
Chloride: 107 mmol/L (ref 98–111)
Creatinine, Ser: 0.77 mg/dL (ref 0.44–1.00)
Creatinine, Ser: 0.72 mg/dL (ref 0.44–1.00)
GFR, Estimated: >60 mL/min (ref >60)
GFR, Estimated: >60 mL/min (ref >60)
Glucose, Bld: 152 mg/dL — ABNORMAL HIGH (ref 70–99)
Glucose, Bld: 190 mg/dL — ABNORMAL HIGH (ref 70–99)
Potassium: 3 mmol/L — ABNORMAL LOW (ref 3.5–5.1)
Potassium: 3.8 mmol/L (ref 3.5–5.1)
Sodium: 138 mmol/L (ref 135–145)
Sodium: 137 mmol/L (ref 135–145)

## 2020-12-13 LAB — MAGNESIUM: Magnesium: 2.1 mg/dL (ref 1.7–2.4)

## 2020-12-13 LAB — PROTIME-INR
INR: 3.3 — ABNORMAL HIGH (ref 0.8–1.2)
INR: 3.2 — ABNORMAL HIGH (ref 0.8–1.2)
Prothrombin Time: 33.3 seconds — ABNORMAL HIGH (ref 11.4–15.2)
Prothrombin Time: 32.9 seconds — ABNORMAL HIGH (ref 11.4–15.2)

## 2020-12-13 LAB — APTT: aPTT: 56 seconds — ABNORMAL HIGH (ref 24–36)

## 2020-12-13 LAB — RETICULOCYTES
Immature Retic Fract: 39.4 % — ABNORMAL HIGH (ref 2.3–15.9)
RBC.: 3.09 MIL/uL — ABNORMAL LOW (ref 3.87–5.11)
Retic Count, Absolute: 94.6 10*3/uL (ref 19.0–186.0)
Retic Ct Pct: 3.1 % (ref 0.4–3.1)

## 2020-12-13 LAB — PREPARE RBC (CROSSMATCH)

## 2020-12-13 MED ORDER — POTASSIUM CHLORIDE CRYS ER 20 MEQ PO TBCR: 40.0000 meq | EXTENDED_RELEASE_TABLET | Freq: Once | ORAL | Status: DC

## 2020-12-13 MED ORDER — POTASSIUM CHLORIDE CRYS ER 20 MEQ PO TBCR
40.0000 meq | EXTENDED_RELEASE_TABLET | Freq: Once | ORAL | Status: AC
  Administered 2020-12-13: 40 meq via ORAL
  Filled 2020-12-13: qty 2

## 2020-12-13 MED ORDER — PHYTONADIONE 5 MG PO TABS
2.5000 mg | ORAL_TABLET | Freq: Once | ORAL | Status: AC
  Administered 2020-12-13: 2.5 mg via ORAL
  Filled 2020-12-13: qty 1

## 2020-12-13 MED ORDER — LACTATED RINGERS IV BOLUS
1000.0000 mL | Freq: Once | INTRAVENOUS | Status: AC
  Administered 2020-12-13: 1000 mL via INTRAVENOUS

## 2020-12-13 MED ORDER — SODIUM CHLORIDE 0.9% IV SOLUTION
Freq: Once | INTRAVENOUS | Status: AC
Start: 2020-12-13 — End: 2020-12-13

## 2020-12-13 NOTE — Progress Notes (Signed)
Pt BP 87/61 with MAp 69.  Pt asymptomatic, denied chest pain, SOB, and dizzines. No change in mental status.  No bleeding from any sources.  Laural Benes, MD notified.  New orders placed.

## 2020-12-13 NOTE — Progress Notes (Signed)
CLINICAL UPDATE:  Paged by RN for worsening tachycardia. Patient evaluated at bedside by this provider. Patient states she is doing well this afternoon, no complaints. Mentions she has been on a liquid diet today and has not had any bowel movements. Says her lower abdominal pain has improved. Denies palpitations, chest pain, dyspnea.  Discussed with patient's RN who reports patient has had at least two bowel movements today with large clots.  Exam: General: Obese female in no acute distress, foul smell in room CV: Tachycardic, regular rhythm. No m/r/g appreciated. Pulm: Normal work of breathing. Clear to auscultation bilaterally. Abd: Soft, non-distended, mild tenderness in lower quadrants. Neuro: Awake, alert, moving extremities appropriately.  A/P: Patient admitted 5/8 with GI bleed. Earlier this AM patient received 1u pRBC with appropriate response, Hgb 7.9<6.8. Given patient has had multiple bloody bowel movements today will obtain stat CBC. Likely tachycardic due to hypovolemia 2/2 GI bleed, will obtain ECG and bolus 1L. K 3.0 this AM, will also obtain BMP. - ECG - Stat CBC, INR, BMP - LR 1L bolus - Will transfuse if Hgb <7  Evlyn Kanner, MD Internal Medicine PGY-1 [phone]

## 2020-12-13 NOTE — Progress Notes (Incomplete)
MD notified of need to change LOC to progressive.

## 2020-12-13 NOTE — Progress Notes (Signed)
Lab called with critical hgb 6.8.  MD on call notified. New orders for blood transfusion placed.

## 2020-12-13 NOTE — Progress Notes (Addendum)
Subjective:  Patient feels fine today. No complaints of dizziness, chest pain, shortness of breath, headache, or pain after BPs of 87/61 and 85/46 overnight. She tolerated her blood transfusion well after having low hemoglobin. She did notice some blood on her bottom today, and she said the nurses were going to help her clean up. She continues to have unchanged lower abdominal and pelvic pain.  Objective:  Vital signs in last 24 hours: Vitals:   12/13/20 0812 12/13/20 0856 12/13/20 1141 12/13/20 1200  BP: (!) 105/59 (!) 91/59 (!) 101/52 (!) 105/57  Pulse: (!) 107 89    Resp: 17 18    Temp: 98.2 F (36.8 C) 98 F (36.7 C)    TempSrc: Oral Oral    SpO2: 96%     Weight:      Height:       Weight change:   Intake/Output Summary (Last 24 hours) at 12/13/2020 1458 Last data filed at 12/13/2020 0856 Gross per 24 hour  Intake 1703.46 ml  Output --  Net 1703.46 ml   Physical Exam Constitutional:      Appearance: Normal appearance.  HENT:     Head: Normocephalic and atraumatic.  Eyes:     Extraocular Movements: Extraocular movements intact.  Cardiovascular:     Rate and Rhythm: Regular rhythm. Tachycardia present.     Heart sounds: Normal heart sounds.  Pulmonary:     Effort: Pulmonary effort is normal.     Breath sounds: Normal breath sounds.  Abdominal:     General: Abdomen is flat. Bowel sounds are normal.     Palpations: Abdomen is soft.     Comments: Pain to lower abdomen and suprapubic areas.  Musculoskeletal:        General: Normal range of motion.     Cervical back: Normal range of motion.  Skin:    General: Skin is warm and dry.  Neurological:     General: No focal deficit present.     Mental Status: She is alert and oriented to person, place, and time.  Psychiatric:        Mood and Affect: Mood normal.        Behavior: Behavior normal.        Thought Content: Thought content normal.    Assessment/Plan:  Principal Problem:   GI bleed Active Problems:    Anemia   Hematochezia   Lower abdominal pain  Zoe Cobb is a 65 y.o. female with a PMH of a fib, CHF, and HTN who presents for evaluation of GI bleed.  1. GI bleed Patient is in NAD and is HDS. Her BP did drop into 80s/40s-60s overnight, but she was asymptomatic. Patient's BP has been lower and stable throughout admission. Hgb overnight was 6.8 and resulting blood transfusion was tolerated well. Recent BP after transfusion is 105/57. Normal iron studies, folate, B12. GI ordered clear diet, start bowel prep, and tentative colonoscopy on Wednesday if bleeding continues. Differential remains mass vs. diverticular hemorrhage vs. angiodysplasia vs. hemorrhoids. -Trend H/H -Observe for increase in bleeding, severe hypotension, altered mental status -Hold home warfarin, await INR to drift down before colonoscopy -GI following, appreciate assistance  2. Hypokalemia Patient's labs reported low potassium of 3.0 last night. Patient was asymptomatic. -Repletion with potassium chloride 40 mEq tablet  3. HTN Patient has experienced episodes of hypotension. -Hold home torsemide  4. CHF Patient is not hypervolemic. Has had episodes of hypotension. -Hold home torsemide  5. A fib Patient has history of a  fib on chronic warfarin. Not currently in a fib on ECG. Awaiting presumed colonoscopy. -Hold home warfarin   LOS: 0 days   Samantha Crimes, Medical Student 12/13/2020, 2:58 PM Pager: [phone] After 5pm on weekdays and 1pm on weekends: On Call pager [phone]

## 2020-12-13 NOTE — Progress Notes (Signed)
BP 85/46, with MAP of 56. Laural Benes, MD notified. Pt still asymptomatic, no change in mental status.

## 2020-12-13 NOTE — Progress Notes (Addendum)
Progress Note  Chief Complaint:  Rectal bleeding       ASSESSMENT / PLAN:    65 yo female with morbid obesity, AFib on warfarin, HTN, CHF. Admitted with rectal bleeding.   # Hematochezia  / [place] anemia on warfarin. Iron studies not c/w iron deficiency. She has had some associated lower abdominal pain. Patient says she just had another episode of rectal bleeding an hour ago. Diverticular hemorrhage? Ischemic colitis (less likely). Neoplasm needs to be excluded --Hgb 7.9 yesterday >> 6.8. She has gotten 1 u PRBC this am. --CTA not able to be done due to patient's weight.   --INR left to drift but no change overnight 3.4 >> 3.3.  --She will need a colonoscopy when INR drifts to acceptable level, most likely will not be until Wed. Would continue clears for now.  --Check INR in am.   # Hypokalemia, K+ 3.0. Repletion already in progress  # History of thrombocytopenia, hypoalbuminemia. Keep possibility of chronic liver disease in mind    Attending Physician Note   I have taken an interval history, reviewed the chart and examined the patient. I agree with the Advanced Practitioner's note, impression and recommendations.   Hematochezia and anemia on warfarin. Hgb=6.8 and INR=3.3 today  Unable to perform CTA due to weight  Continue to hold warfarin Trend CBC, transfuse for Hgb < 7 Colonoscopy when INR allows  Claudette Head, MD [facility] [phone]       SUBJECTIVE:   Had some lower abdominal discomfort earlier, better after tylenol. Patient says she had blood in a BM about an hour ago.     OBJECTIVE:    Scheduled inpatient medications:  . potassium chloride  40 mEq Oral Once   Continuous inpatient infusions:  . lactated ringers 100 mL/hr at 12/13/20 0905   PRN inpatient medications: acetaminophen **OR** acetaminophen, senna-docusate  Vital signs in last 24 hours: Temp:  [97.8 F (36.6 C)-98.2 F (36.8 C)] 98 F (36.7 C) (05/09 0856) Pulse Rate:  [77-125] 89 (05/09  0856) Resp:  [13-21] 18 (05/09 0856) BP: (85-146)/(45-100) 91/59 (05/09 0856) SpO2:  [96 %-100 %] 96 % (05/09 0812) Last BM Date: 12/12/20  Intake/Output Summary (Last 24 hours) at 12/13/2020 1125 Last data filed at 12/13/2020 0856 Gross per 24 hour  Intake 1703.46 ml  Output --  Net 1703.46 ml     Physical Exam:  General: Alert female in NAD Heart:  Regular rate  Pulmonary: Normal respiratory effort Abdomen: Soft, nondistended, nontender. Normal bowel sounds.  Neurologic: Alert and oriented Psych: Pleasant. Cooperative.   Filed Weights   12/12/20 0817  Weight: (!) 204.1 kg    Intake/Output from previous day: 05/08 0701 - 05/09 0700 In: 1403.5 [P.O.:480; I.V.:923.5] Out: -  Intake/Output this shift: Total I/O In: 300 [Blood:300] Out: -     Lab Results: Recent Labs    12/12/20 0850 12/13/20 0409  WBC 11.2* 10.3  HGB 7.9* 6.8*  HCT 26.9* 21.9*  PLT 262 249   BMET Recent Labs    12/12/20 0850 12/13/20 0409  NA 138 138  K 3.4* 3.0*  CL 104 106  CO2 25 26  GLUCOSE 141* 152*  BUN 41* 30*  CREATININE 0.91 0.77  CALCIUM 7.9* 8.0*   LFT Recent Labs    12/12/20 0850  PROT 6.8  ALBUMIN 2.1*  AST 15  ALT 10  ALKPHOS 86  BILITOT 0.5   PT/INR Recent Labs    12/12/20 0850 12/13/20 0409  LABPROT  34.3* 33.3*  INR 3.4* 3.3*   Hepatitis Panel No results for input(s): HEPBSAG, HCVAB, HEPAIGM, HEPBIGM in the last 72 hours.  No results found.   Principal Problem:   GI bleed Active Problems:   Anemia   Hematochezia   Lower abdominal pain     LOS: 0 days   Willette Cluster ,NP 12/13/2020, 11:25 AM

## 2020-12-14 ENCOUNTER — Encounter (HOSPITAL_COMMUNITY): Payer: Self-pay | Admitting: Internal Medicine

## 2020-12-14 DIAGNOSIS — Z7901 Long term (current) use of anticoagulants: Secondary | ICD-10-CM | POA: Diagnosis not present

## 2020-12-14 DIAGNOSIS — R1030 Lower abdominal pain, unspecified: Secondary | ICD-10-CM | POA: Diagnosis not present

## 2020-12-14 DIAGNOSIS — D62 Acute posthemorrhagic anemia: Secondary | ICD-10-CM

## 2020-12-14 DIAGNOSIS — K922 Gastrointestinal hemorrhage, unspecified: Secondary | ICD-10-CM

## 2020-12-14 DIAGNOSIS — K921 Melena: Secondary | ICD-10-CM | POA: Diagnosis not present

## 2020-12-14 DIAGNOSIS — I5032 Chronic diastolic (congestive) heart failure: Secondary | ICD-10-CM

## 2020-12-14 DIAGNOSIS — I480 Paroxysmal atrial fibrillation: Secondary | ICD-10-CM

## 2020-12-14 LAB — CBC
HCT: 23.1 % — ABNORMAL LOW (ref 36.0–46.0)
Hemoglobin: 7.1 g/dL — ABNORMAL LOW (ref 12.0–15.0)
MCH: 25.6 pg — ABNORMAL LOW (ref 26.0–34.0)
MCHC: 30.7 g/dL (ref 30.0–36.0)
MCV: 83.4 fL (ref 80.0–100.0)
Platelets: 280 10*3/uL (ref 150–400)
RBC: 2.77 MIL/uL — ABNORMAL LOW (ref 3.87–5.11)
RDW: 17.2 % — ABNORMAL HIGH (ref 11.5–15.5)
WBC: 11.3 10*3/uL — ABNORMAL HIGH (ref 4.0–10.5)
nRBC: 0.4 % — ABNORMAL HIGH (ref 0.0–0.2)

## 2020-12-14 LAB — PREPARE RBC (CROSSMATCH)

## 2020-12-14 LAB — PROTIME-INR
INR: 1.1 (ref 0.8–1.2)
Prothrombin Time: 14.3 seconds (ref 11.4–15.2)

## 2020-12-14 MED ORDER — PEG-KCL-NACL-NASULF-NA ASC-C 100 G PO SOLR
0.5000 | Freq: Once | ORAL | Status: AC
  Administered 2020-12-14: 100 g via ORAL
  Filled 2020-12-14 (×2): qty 1

## 2020-12-14 MED ORDER — SODIUM CHLORIDE 0.9% IV SOLUTION: Freq: Once | INTRAVENOUS | Status: AC

## 2020-12-14 MED ORDER — ZINC OXIDE 12.8 % EX OINT
TOPICAL_OINTMENT | CUTANEOUS | Status: DC | PRN
  Filled 2020-12-14: qty 56.7

## 2020-12-14 MED ORDER — PEG-KCL-NACL-NASULF-NA ASC-C 100 G PO SOLR: 1.0000 | Freq: Once | ORAL | Status: DC

## 2020-12-14 MED ORDER — SODIUM CHLORIDE 0.9% IV SOLUTION: Freq: Once | INTRAVENOUS | Status: DC

## 2020-12-14 MED ORDER — PEG-KCL-NACL-NASULF-NA ASC-C 100 G PO SOLR
0.5000 | Freq: Once | ORAL | Status: AC
  Administered 2020-12-15: 100 g via ORAL
  Filled 2020-12-14: qty 1

## 2020-12-14 NOTE — Anesthesia Preprocedure Evaluation (Addendum)
Anesthesia Evaluation  Patient identified by MRN, date of birth, ID band Patient awake    Reviewed: Allergy & Precautions, H&P , NPO status , Patient's Chart, lab work & pertinent test results  Airway Mallampati: II  TM Distance: >3 FB Neck ROM: Full    Dental no notable dental hx. (+) Teeth Intact, Missing, Poor Dentition, Chipped, Dental Advisory Given   Pulmonary shortness of breath and with exertion, former smoker,    Pulmonary exam normal breath sounds clear to auscultation       Cardiovascular Exercise Tolerance: Good hypertension, Pt. on home beta blockers and Pt. on medications +CHF  Normal cardiovascular exam+ dysrhythmias Atrial Fibrillation  Rhythm:Regular Rate:Normal  ECHO 18 Technically difficult; definity used; normal LV systolic  function; grade 2 diastolic dysfunction; moderate LAE; mild RAE;  mild TR with mildly elevated pulmonary pressure.    Neuro/Psych negative neurological ROS  negative psych ROS   GI/Hepatic negative GI ROS, Neg liver ROS,   Endo/Other  Morbid obesity  Renal/GU ARFRenal disease  negative genitourinary   Musculoskeletal negative musculoskeletal ROS (+)   Abdominal   Peds negative pediatric ROS (+)  Hematology  (+) Blood dyscrasia, anemia ,   Anesthesia Other Findings   Reproductive/Obstetrics negative OB ROS                           Anesthesia Physical Anesthesia Plan  ASA: IV  Anesthesia Plan: MAC   Post-op Pain Management:    Induction: Intravenous  PONV Risk Score and Plan: 2  Airway Management Planned: Nasal Cannula and Natural Airway  Additional Equipment: None  Intra-op Plan:   Post-operative Plan:   Informed Consent: I have reviewed the patients History and Physical, chart, labs and discussed the procedure including the risks, benefits and alternatives for the proposed anesthesia with the patient or authorized representative  who has indicated his/her understanding and acceptance.       Plan Discussed with: Anesthesiologist and CRNA  Anesthesia Plan Comments:        Anesthesia Quick Evaluation

## 2020-12-14 NOTE — Consult Note (Addendum)
WOC Nurse Consult Note: Patient receiving care in [facility] 2W11 Reason for Consult: Anterior elbow wound Wound type: Right Right Anterior Elbow fissure MASD/Partial thickness Pressure Injury POA: NA Measurement: Deferred Wound bed: Moist, Pink Drainage (amount, consistency, odor) None Periwound: Intact Dressing procedure/placement/frequency: Clean thoroughly with soap and water, dry and apply thin layer of triple paste in the crevice of the anterior elbow to act as a moisture barrier Advised patient to keep a soft cloth in the fold of both arms at night since she sleeps with the arms folded.   Monitor the wound area(s) for worsening of condition such as: Signs/symptoms of infection, increase in size, development of or worsening of odor, development of pain, or increased pain at the affected locations.   Notify the medical team if any of these develop.  Thank you for the consult. WOC nurse will not follow at this time.   Please re-consult the WOC team if needed.  Renaldo Reel Katrinka Blazing, MSN, RN, CMSRN, Angus Seller, [place] [facility] Pager [phone]

## 2020-12-14 NOTE — Progress Notes (Signed)
Communicated to medical r/t pt having a diet. States will be coming to see patient. Pt will also need updated cardiac monitoring orders per tele tech.

## 2020-12-14 NOTE — Progress Notes (Signed)
    Progress Note   Subjective  Rectal bleeding, tachycardia yesterday. Some rectal bleeding overnight    Objective  Vital signs in last 24 hours: Temp:  [98 F (36.7 C)-98.8 F (37.1 C)] 98 F (36.7 C) (05/10 0352) Pulse Rate:  [95-124] 102 (05/10 0352) Resp:  [20] 20 (05/10 0352) BP: (93-114)/(52-73) 94/53 (05/10 0352) Last BM Date: 12/09/20 (pt stated)  General: Alert, well-developed, morbid obesity, in NAD Heart:  Regular rate and rhythm; no murmurs Chest: Clear to ascultation bilaterally Abdomen:  Soft, nontender and nondistended. Normal bowel sounds, without guarding, and without rebound.   Extremities:  Without edema. Neurologic:  Alert and  oriented x4; grossly normal neurologically. Psych:  Alert and cooperative. Normal mood and affect.  Intake/Output from previous day: 05/09 0701 - 05/10 0700 In: 300 [Blood:300] Out: 900 [Urine:900] Intake/Output this shift: No intake/output data recorded.  Lab Results: Recent Labs    12/13/20 0409 12/13/20 1047 12/13/20 1824  WBC 10.3 9.5 11.9*  HGB 6.8* 7.9* 7.4*  HCT 21.9* 25.4* 25.1*  PLT 249 260 249   BMET Recent Labs    12/12/20 0850 12/13/20 0409 12/13/20 1824  NA 138 138 137  K 3.4* 3.0* 3.8  CL 104 106 107  CO2 25 26 22   GLUCOSE 141* 152* 190*  BUN 41* 30* 24*  CREATININE 0.91 0.77 0.72  CALCIUM 7.9* 8.0* 7.7*   LFT Recent Labs    12/12/20 0850  PROT 6.8  ALBUMIN 2.1*  AST 15  ALT 10  ALKPHOS 86  BILITOT 0.5   PT/INR Recent Labs    12/13/20 1824 12/14/20 0012  LABPROT 32.9* 14.3  INR 3.2* 1.1      Assessment & Recommendations   1. Hematochezia persists, lower abdominal pain resolving. Colonoscopy when INR < 1.7. Continue clear liquids.   2. ABL anemia. Hgb decreased to 6.8. Recommend transfusion to maintain Hgb > 7 - defer to primary service.   3. Afib, coagulopathy, INR=3.2. PO Vit K yesterday. Consider repeat PO Vit K or IV Vit K today - defer to primary service. Continue to  hold Coumadin.   4. Hypokalemia corrected.      LOS: 1 day    T. 02/13/21 MD 12/14/2020, 9:07 AM [phone]

## 2020-12-14 NOTE — Progress Notes (Signed)
Blood product compatibility reconfirmed with Britta Mccreedy in blood bank. Product verified by 3 nurses-self, management and additional staff nurse. Pt educated on s/s of reaction and to make nurse aware if pt feels discomfort at anytime during transfusion. Pt remained agreeable to receiving product.

## 2020-12-14 NOTE — Progress Notes (Addendum)
   Subjective:  Patient is doing well today. She is hungry and thirsty. She denies any pain, dizziness, SOB, chest pain, or headache after her episode of tachycardia and hypotension last night. She states that her lower abdominal pain has improved, but she is still bleeding from her bottom.   Objective:  Vital signs in last 24 hours: Vitals:   12/13/20 1944 12/13/20 2312 12/14/20 0352 12/14/20 1259  BP: 93/62 114/73 (!) 94/53   Pulse: (!) 124 95 (!) 102   Resp: 20 20 20    Temp: 98.6 F (37 C) 98.8 F (37.1 C) 98 F (36.7 C) 99 F (37.2 C)  TempSrc: Oral Oral Oral Oral  SpO2:      Weight:      Height:       Weight change:   Intake/Output Summary (Last 24 hours) at 12/14/2020 1301 Last data filed at 12/14/2020 0354 Gross per 24 hour  Intake --  Output 900 ml  Net -900 ml   Physical Exam Constitutional:      Appearance: Normal appearance.  HENT:     Head: Normocephalic and atraumatic.  Eyes:     Extraocular Movements: Extraocular movements intact.  Cardiovascular:     Rate and Rhythm: Normal rate and regular rhythm.     Heart sounds: Normal heart sounds.  Pulmonary:     Effort: Pulmonary effort is normal.     Breath sounds: Normal breath sounds.  Abdominal:     General: Abdomen is flat.     Palpations: Abdomen is soft.  Musculoskeletal:        General: Normal range of motion.     Cervical back: Normal range of motion.  Skin:    General: Skin is warm and dry.  Neurological:     General: No focal deficit present.     Mental Status: She is alert and oriented to person, place, and time. Mental status is at baseline.  Psychiatric:        Mood and Affect: Mood normal.        Behavior: Behavior normal.        Thought Content: Thought content normal.    Assessment/Plan:  Principal Problem:   GI bleed Active Problems:   Morbid obesity due to excess calories (HCC)   Chronic diastolic (congestive) heart failure (HCC)   Paroxysmal atrial fibrillation (HCC)    Anticoagulated   Anemia   Hematochezia   Lower abdominal pain   Acute blood loss anemia  1. GI bleed Patient is in NAD and afebrile. She continues to experience asymptomatic tachycardia and hypotension. She also continues to have bleeding from her bottom with improving lower abdominal pain. Most recent Hgb 7.4, HCT 25.1, INR 1.1. Net I/O -900 mL. -Transfuse 1 unit of blood due to continued bleeding, trending H&H -IV LR bolus as needed after transfusion -Colonoscopy scheduled for tomorrow 9 AM per GI, bowel prep started -Trend vitals, CBC -Continue to hold warfarin  2. Hypokalemia Patient's potassium is 3.8 today after KCl tablets. -Monitor BMP  3. HTN, CHF Patient has experienced continued episodes of hypotension. -Continue to hold home torsemide.  4. A fib Patient is in sinus rhythm today. On chronic warfarin. -Hold warfarin in setting of GI bleed, upcoming colonoscopy   LOS: 1 day   02/13/2021, Medical Student 12/14/2020, 1:01 PM Pager: [phone] After 5pm on weekdays and 1pm on weekends: On Call pager [phone]

## 2020-12-14 NOTE — Plan of Care (Signed)

## 2020-12-14 NOTE — H&P (View-Only) (Signed)
    Progress Note   Subjective  Rectal bleeding, tachycardia yesterday. Some rectal bleeding overnight    Objective  Vital signs in last 24 hours: Temp:  [98 F (36.7 C)-98.8 F (37.1 C)] 98 F (36.7 C) (05/10 0352) Pulse Rate:  [95-124] 102 (05/10 0352) Resp:  [20] 20 (05/10 0352) BP: (93-114)/(52-73) 94/53 (05/10 0352) Last BM Date: 12/09/20 (pt stated)  General: Alert, well-developed, morbid obesity, in NAD Heart:  Regular rate and rhythm; no murmurs Chest: Clear to ascultation bilaterally Abdomen:  Soft, nontender and nondistended. Normal bowel sounds, without guarding, and without rebound.   Extremities:  Without edema. Neurologic:  Alert and  oriented x4; grossly normal neurologically. Psych:  Alert and cooperative. Normal mood and affect.  Intake/Output from previous day: 05/09 0701 - 05/10 0700 In: 300 [Blood:300] Out: 900 [Urine:900] Intake/Output this shift: No intake/output data recorded.  Lab Results: Recent Labs    12/13/20 0409 12/13/20 1047 12/13/20 1824  WBC 10.3 9.5 11.9*  HGB 6.8* 7.9* 7.4*  HCT 21.9* 25.4* 25.1*  PLT 249 260 249   BMET Recent Labs    12/12/20 0850 12/13/20 0409 12/13/20 1824  NA 138 138 137  K 3.4* 3.0* 3.8  CL 104 106 107  CO2 25 26 22  GLUCOSE 141* 152* 190*  BUN 41* 30* 24*  CREATININE 0.91 0.77 0.72  CALCIUM 7.9* 8.0* 7.7*   LFT Recent Labs    12/12/20 0850  PROT 6.8  ALBUMIN 2.1*  AST 15  ALT 10  ALKPHOS 86  BILITOT 0.5   PT/INR Recent Labs    12/13/20 1824 12/14/20 0012  LABPROT 32.9* 14.3  INR 3.2* 1.1      Assessment & Recommendations   1. Hematochezia persists, lower abdominal pain resolving. Colonoscopy when INR < 1.7. Continue clear liquids.   2. ABL anemia. Hgb decreased to 6.8. Recommend transfusion to maintain Hgb > 7 - defer to primary service.   3. Afib, coagulopathy, INR=3.2. PO Vit K yesterday. Consider repeat PO Vit K or IV Vit K today - defer to primary service. Continue to  hold Coumadin.   4. Hypokalemia corrected.      LOS: 1 day    T.  MD 12/14/2020, 9:07 AM [phone] 

## 2020-12-15 ENCOUNTER — Inpatient Hospital Stay (HOSPITAL_COMMUNITY): Payer: 59 | Admitting: Certified Registered Nurse Anesthetist

## 2020-12-15 ENCOUNTER — Encounter (HOSPITAL_COMMUNITY): Payer: Self-pay | Admitting: Internal Medicine

## 2020-12-15 ENCOUNTER — Encounter (HOSPITAL_COMMUNITY)
Admission: EM | Disposition: A | Discharge status: Home / Self Care | Payer: Self-pay | Source: Home / Self Care | Attending: Internal Medicine

## 2020-12-15 DIAGNOSIS — K5730 Diverticulosis of large intestine without perforation or abscess without bleeding: Secondary | ICD-10-CM

## 2020-12-15 DIAGNOSIS — K921 Melena: Secondary | ICD-10-CM | POA: Diagnosis not present

## 2020-12-15 HISTORY — PX: COLONOSCOPY WITH PROPOFOL: SHX5780

## 2020-12-15 LAB — TYPE AND SCREEN
ABO/RH(D): A POS
Antibody Screen: NEGATIVE
Unit division: 0
Unit division: 0
Unit division: 0

## 2020-12-15 LAB — BPAM RBC
Blood Product Expiration Date: 202205122359
Blood Product Expiration Date: 202205162359
Blood Product Expiration Date: 202205282359
ISSUE DATE / TIME: 202205090615
ISSUE DATE / TIME: 202205101304
ISSUE DATE / TIME: 202205102158
Unit Type and Rh: 600
Unit Type and Rh: 6200
Unit Type and Rh: 6200

## 2020-12-15 LAB — CBC
HCT: 27.8 % — ABNORMAL LOW (ref 36.0–46.0)
Hemoglobin: 8.6 g/dL — ABNORMAL LOW (ref 12.0–15.0)
MCH: 26 pg (ref 26.0–34.0)
MCHC: 30.9 g/dL (ref 30.0–36.0)
MCV: 84 fL (ref 80.0–100.0)
Platelets: 271 10*3/uL (ref 150–400)
RBC: 3.31 MIL/uL — ABNORMAL LOW (ref 3.87–5.11)
RDW: 17.2 % — ABNORMAL HIGH (ref 11.5–15.5)
WBC: 10.5 10*3/uL (ref 4.0–10.5)
nRBC: 0.4 % — ABNORMAL HIGH (ref 0.0–0.2)

## 2020-12-15 SURGERY — COLONOSCOPY WITH PROPOFOL
Anesthesia: Monitor Anesthesia Care

## 2020-12-15 MED ORDER — PROPOFOL 500 MG/50ML IV EMUL
INTRAVENOUS | Status: DC | PRN
  Administered 2020-12-15: 75 ug/kg/min via INTRAVENOUS

## 2020-12-15 MED ORDER — PHENYLEPHRINE HCL-NACL 10-0.9 MG/250ML-% IV SOLN
INTRAVENOUS | Status: DC | PRN
  Administered 2020-12-15: 100 ug/min via INTRAVENOUS

## 2020-12-15 MED ORDER — DEXMEDETOMIDINE (PRECEDEX) IN NS 20 MCG/5ML (4 MCG/ML) IV SYRINGE
PREFILLED_SYRINGE | INTRAVENOUS | Status: DC | PRN
  Administered 2020-12-15 (×2): 8 ug via INTRAVENOUS

## 2020-12-15 MED ORDER — SODIUM CHLORIDE 0.9 % IV SOLN: INTRAVENOUS | Status: DC

## 2020-12-15 MED ORDER — ESMOLOL HCL 100 MG/10ML IV SOLN
INTRAVENOUS | Status: DC | PRN
  Administered 2020-12-15: 20 mg via INTRAVENOUS

## 2020-12-15 MED ORDER — PHENYLEPHRINE 40 MCG/ML (10ML) SYRINGE FOR IV PUSH (FOR BLOOD PRESSURE SUPPORT)
PREFILLED_SYRINGE | INTRAVENOUS | Status: DC | PRN
  Administered 2020-12-15: 80 ug via INTRAVENOUS
  Administered 2020-12-15: 120 ug via INTRAVENOUS

## 2020-12-15 MED ORDER — LIDOCAINE 2% (20 MG/ML) 5 ML SYRINGE
INTRAMUSCULAR | Status: DC | PRN
  Administered 2020-12-15: 60 mg via INTRAVENOUS

## 2020-12-15 SURGICAL SUPPLY — 22 items

## 2020-12-15 NOTE — Interval H&P Note (Signed)
History and Physical Interval Note:  12/15/2020 8:57 AM  Zoe Cobb  has presented today for surgery, with the diagnosis of GI bleed.  The various methods of treatment have been discussed with the patient and family. After consideration of risks, benefits and other options for treatment, the patient has consented to  Procedure(s): COLONOSCOPY WITH PROPOFOL (N/A) as a surgical intervention.  The patient's history has been reviewed, patient examined, no change in status, stable for surgery.  I have reviewed the patient's chart and labs.  Questions were answered to the patient's satisfaction.     Venita Lick. Russella Dar

## 2020-12-15 NOTE — Progress Notes (Signed)
Night time nurse reports pt completed bowel prep but that wastes remained unclear.

## 2020-12-15 NOTE — Progress Notes (Signed)
Spoke with medical team r/t pt questions about home meds and iv fluids. If map less than 65 may restart LR at 100 ml/hr. Home meds not initiating at this time with plan for coumadin being held approximately 5 days. Medical will monitor/re-evaluate as needed.

## 2020-12-15 NOTE — Anesthesia Procedure Notes (Signed)
Procedure Name: MAC Date/Time: 12/15/2020 9:30 AM Performed by: Renato Shin, CRNA Pre-anesthesia Checklist: Patient identified, Emergency Drugs available, Suction available and Patient being monitored Patient Re-evaluated:Patient Re-evaluated prior to induction Oxygen Delivery Method: Nasal cannula Preoxygenation: Pre-oxygenation with 100% oxygen Induction Type: IV induction Placement Confirmation: positive ETCO2 and breath sounds checked- equal and bilateral Dental Injury: Teeth and Oropharynx as per pre-operative assessment

## 2020-12-15 NOTE — Anesthesia Postprocedure Evaluation (Signed)
Anesthesia Post Note  Patient: Zoe Cobb  Procedure(s) Performed: COLONOSCOPY WITH PROPOFOL (N/A )     Patient location during evaluation: PACU Anesthesia Type: MAC Level of consciousness: awake and alert Pain management: pain level controlled Vital Signs Assessment: post-procedure vital signs reviewed and stable Respiratory status: spontaneous breathing, nonlabored ventilation, respiratory function stable and patient connected to nasal cannula oxygen Cardiovascular status: stable and blood pressure returned to baseline Postop Assessment: no apparent nausea or vomiting Anesthetic complications: no   No complications documented.  Last Vitals:  Vitals:   12/15/20 1044 12/15/20 1045  BP:  (!) 91/49  Pulse:  81  Resp:  18  Temp: 37.1 C   SpO2:  99%    Last Pain:  Vitals:   12/15/20 1044  TempSrc: Oral  PainSc:                  ,

## 2020-12-15 NOTE — Progress Notes (Signed)
Internal Medicine Attending:   I saw and examined the patient. I reviewed Student Dr Phineas Real and Dr Alphonsa Overall note and I agree with the resident's findings and plan as documented in the resident's note.  Colonoscopy has completed and notable for mod left side diverticulosis without active bleeding.  Suspect this was a diverticular bleed that has resolved.  Will need repeat Colonoscopy in 5 years. Continue to hold coumadin for at least 5 days then may resume.

## 2020-12-15 NOTE — Progress Notes (Signed)
   Subjective:  Patient is doing well this morning. She has no complaints of pain or headaches, but she does endorse some dizziness for a couple of seconds when she took her medications this morning. She states it went away quickly, and it did not really bother her. She states she has not bled anymore from her bottom since yesterday. She is ready to complete her colonoscopy this morning and "find out what's going on."  Objective:  Vital signs in last 24 hours: Vitals:   12/15/20 0523 12/15/20 0743 12/15/20 0812 12/15/20 0813  BP: 115/61 (!) 94/47  (!) 92/45  Pulse: 95 82  71  Resp: 17 17  19   Temp: 97.9 F (36.6 C) 97.8 F (36.6 C) 98.1 F (36.7 C)   TempSrc: Oral Oral Oral   SpO2: 97% 99%  92%  Weight:      Height:       Weight change:   Intake/Output Summary (Last 24 hours) at 12/15/2020 0835 Last data filed at 12/15/2020 0500 Gross per 24 hour  Intake 4462.3 ml  Output --  Net 4462.3 ml   Physical Exam Constitutional:      General: She is not in acute distress.    Appearance: Normal appearance.  HENT:     Head: Normocephalic and atraumatic.  Eyes:     Extraocular Movements: Extraocular movements intact.  Cardiovascular:     Rate and Rhythm: Normal rate and regular rhythm.     Heart sounds: Normal heart sounds.  Pulmonary:     Effort: Pulmonary effort is normal.     Breath sounds: Normal breath sounds.  Abdominal:     General: Abdomen is flat.     Palpations: Abdomen is soft.  Musculoskeletal:        General: Normal range of motion.     Cervical back: Normal range of motion and neck supple.  Skin:    General: Skin is warm and dry.  Neurological:     General: No focal deficit present.     Mental Status: She is alert and oriented to person, place, and time. Mental status is at baseline.  Psychiatric:        Mood and Affect: Mood normal.        Behavior: Behavior normal.        Thought Content: Thought content normal.    Assessment/Plan:  Principal  Problem:   GI bleed Active Problems:   Morbid obesity due to excess calories (HCC)   Chronic diastolic (congestive) heart failure (HCC)   Paroxysmal atrial fibrillation (HCC)   Anticoagulated   Anemia   Hematochezia   Lower abdominal pain   Acute blood loss anemia  1. GI bleed Patient is overall doing well this morning and still asymptomatic despite one episode of dizziness for a couple of seconds after taking her medication. Has not had any more noticeable bleeding. Transfusion completed yesterday due to continued bleeding and Hgb 7.4, Hgb actually decreased to 7.1. Second transfusion completed, Hgb raised to 8.6. I/O net +4.4L. BP stable at 115/61. GI will proceed with colonoscopy this morning at 9 AM. -Continue IV LR 100 mL/hr -Transfuse blood as needed -Will follow up results of colonoscopy  2. HTN Patient's BP remains stable at 92/45. Earlier this morning, BP was 115/61. -Given lower BP, continue holding torsemide   LOS: 2 days   02/14/2021, Medical Student 12/15/2020, 8:35 AM Pager: [phone] After 5pm on weekdays and 1pm on weekends: On Call pager [phone]

## 2020-12-15 NOTE — Transfer of Care (Signed)
Immediate Anesthesia Transfer of Care Note  Patient: Aishia Barkey  Procedure(s) Performed: COLONOSCOPY WITH PROPOFOL (N/A )  Patient Location: PACU and Endoscopy Unit  Anesthesia Type:MAC  Level of Consciousness: awake and patient cooperative  Airway & Oxygen Therapy: Patient Spontanous Breathing and Patient connected to nasal cannula oxygen  Post-op Assessment: Report given to RN and Post -op Vital signs reviewed and stable  Post vital signs: Reviewed and stable  Last Vitals:  Vitals Value Taken Time  BP 97/56 12/15/20 0955  Temp 37.1 C 12/15/20 0955  Pulse 83 12/15/20 1006  Resp 14 12/15/20 1006  SpO2 100 % 12/15/20 1006  Vitals shown include unvalidated device data.  Last Pain:  Vitals:   12/15/20 0955  TempSrc: Axillary  PainSc: 0-No pain      Patients Stated Pain Goal: 0 (76/81/15 7262)  Complications: No complications documented.

## 2020-12-15 NOTE — Plan of Care (Signed)
Patient denies pain or discomfort at this time. No bleeding noted. Safety precautions maintained.

## 2020-12-15 NOTE — Op Note (Signed)
[facility] Patient Name: Zoe Cobb Procedure Date : 12/15/2020 MRN: [id number] Attending MD: Meryl Dare , MD Date of Birth: Jul 27, 1956 CSN: [id number] Age: 65 Admit Type: Inpatient Procedure:                Colonoscopy Indications:              Hematochezia Providers:                Venita Lick. Russella Dar, MD, Brooke Person, Brion Aliment, Technician Referring MD:             Harmon Dun, DO Medicines:                Monitored Anesthesia Care Complications:            No immediate complications. Estimated blood loss:                            None. Estimated Blood Loss:     Estimated blood loss: none. Procedure:                Pre-Anesthesia Assessment:                           - Prior to the procedure, a History and Physical                            was performed, and patient medications and                            allergies were reviewed. The patient's tolerance of                            previous anesthesia was also reviewed. The risks                            and benefits of the procedure and the sedation                            options and risks were discussed with the patient.                            All questions were answered, and informed consent                            was obtained. Prior Anticoagulants: The patient has                            taken Coumadin (warfarin), last dose was 5 days                            prior to procedure. ASA Grade Assessment: IV - A                            patient  with severe systemic disease that is a                            constant threat to life. After reviewing the risks                            and benefits, the patient was deemed in                            satisfactory condition to undergo the procedure.                           After obtaining informed consent, the colonoscope                            was passed under direct vision. Throughout the                             procedure, the patient's blood pressure, pulse, and                            oxygen saturations were monitored continuously. The                            CF-HQ190L ([id number] Olympus colonoscope was                            introduced through the anus and advanced to the the                            cecum, identified by appendiceal orifice and                            ileocecal valve. The ileocecal valve, appendiceal                            orifice, and rectum were photographed. The quality                            of the bowel preparation was fair. The colonoscopy                            was performed without difficulty. The patient                            tolerated the procedure well. Scope In: 9:30:57 AM Scope Out: 9:49:17 AM Scope Withdrawal Time: 0 hours 13 minutes 34 seconds  Total Procedure Duration: 0 hours 18 minutes 20 seconds  Findings:      The perianal and digital rectal examinations were normal.      Multiple medium-mouthed diverticula were found in the left colon. There       was evidence of an impacted diverticulum. There was no evidence of       diverticular bleeding.      The exam was  otherwise without abnormality on direct and retroflexion       views. No old or fresh blood was noted in the colon. Impression:               - Preparation of the colon was fair.                           - Moderate diverticulosis in the left colon. There                            was evidence of an impacted diverticulum. There was                            no evidence of diverticular bleeding.                           - The examination was otherwise normal on direct                            and retroflexion views.                           - No specimens collected. Recommendation:           - Consider repeat colonoscopy in 5 years (shorter                            interval due to fair prep) for screening purposes                             with an extended bowel prep with Dr. Barron Alvine.                           - Resume Coumadin (warfarin) no sooner than 5 days                            at prior dose. Refer to managing physician for                            further adjustment of therapy.                           - Return to hospital unit for ongoing care.                           - High fiber diet.                           - Continue present medications.                           - Very likely a diverticular bleed that has                            resolved.                           -  Outpatient GI follow up is not needed at this                            time. Follow up prn for GI needs with Dr.                            Barron Alvine.                           - Post hospital follow with PCP to monitor anemia,                            etc.                           - GI signing off. Procedure Code(s):        --- Professional ---                           [phone], Colonoscopy, flexible; diagnostic, including                            collection of specimen(s) by brushing or washing,                            when performed (separate procedure) Diagnosis Code(s):        --- Professional ---                           K92.1, Melena (includes Hematochezia)                           K57.30, Diverticulosis of large intestine without                            perforation or abscess without bleeding CPT copyright 2019 [employer]. All rights reserved. The codes documented in this report are preliminary and upon coder review may  be revised to meet current compliance requirements. Meryl Dare, MD 12/15/2020 10:06:41 AM This report has been signed electronically. Number of Addenda: 0

## 2020-12-15 NOTE — Progress Notes (Signed)
MD paged to update pt daughter on status per pt request.  Daughter: Amil Amen [phone]

## 2020-12-15 NOTE — Progress Notes (Signed)
Spoke with MD r/t pt request to speak with daughter Amil Amen [phone]. Tele called and also stated that cardiac monitoring order should be reviewed and updated as possible.

## 2020-12-16 ENCOUNTER — Encounter (HOSPITAL_COMMUNITY): Payer: Self-pay | Admitting: Gastroenterology

## 2020-12-16 LAB — CBC
HCT: 23.9 % — ABNORMAL LOW (ref 36.0–46.0)
Hemoglobin: 7.4 g/dL — ABNORMAL LOW (ref 12.0–15.0)
MCH: 26.3 pg (ref 26.0–34.0)
MCHC: 31 g/dL (ref 30.0–36.0)
MCV: 85.1 fL (ref 80.0–100.0)
Platelets: 330 10*3/uL (ref 150–400)
RBC: 2.81 MIL/uL — ABNORMAL LOW (ref 3.87–5.11)
RDW: 18.6 % — ABNORMAL HIGH (ref 11.5–15.5)
WBC: 9.5 10*3/uL (ref 4.0–10.5)
nRBC: 0 % (ref 0.0–0.2)

## 2020-12-16 MED ORDER — LACTATED RINGERS IV BOLUS
500.0000 mL | Freq: Once | INTRAVENOUS | Status: AC
  Administered 2020-12-16: 500 mL via INTRAVENOUS

## 2020-12-16 MED ORDER — SENNOSIDES-DOCUSATE SODIUM 8.6-50 MG PO TABS: 1.0000 | ORAL_TABLET | Freq: Every evening | ORAL | 0 refills | Status: DC | PRN

## 2020-12-16 NOTE — Progress Notes (Signed)
   Subjective:  Patient is doing well this morning. She denies any complaints. She has not observed any more blood. She is ready to go home.  Objective:  Vital signs in last 24 hours: Vitals:   12/16/20 0600 12/16/20 0700 12/16/20 0859 12/16/20 0900  BP: 111/82   (!) 92/44  Pulse: 82 97    Resp: 18 19 16    Temp: 98.4 F (36.9 C) 98.4 F (36.9 C)    TempSrc: Oral Oral    SpO2:  97%    Weight:      Height:       Weight change:   Intake/Output Summary (Last 24 hours) at 12/16/2020 1120 Last data filed at 12/15/2020 2106 Gross per 24 hour  Intake --  Output 550 ml  Net -550 ml   Physical Exam Constitutional:      General: She is not in acute distress.    Appearance: Normal appearance.  HENT:     Head: Normocephalic and atraumatic.  Eyes:     Extraocular Movements: Extraocular movements intact.  Cardiovascular:     Rate and Rhythm: Normal rate and regular rhythm.     Heart sounds: Normal heart sounds.  Pulmonary:     Effort: Pulmonary effort is normal.     Breath sounds: Normal breath sounds.  Abdominal:     General: Abdomen is flat.     Palpations: Abdomen is soft.  Musculoskeletal:        General: Normal range of motion.     Cervical back: Normal range of motion and neck supple.  Skin:    General: Skin is warm and dry.  Neurological:     General: No focal deficit present.     Mental Status: She is alert and oriented to person, place, and time. Mental status is at baseline.  Psychiatric:        Mood and Affect: Mood normal.        Behavior: Behavior normal.        Thought Content: Thought content normal.    Assessment/Plan:  Principal Problem:   GI bleed Active Problems:   Morbid obesity due to excess calories (HCC)   Chronic diastolic (congestive) heart failure (HCC)   Paroxysmal atrial fibrillation (HCC)   Anticoagulated   Anemia   Hematochezia   Lower abdominal pain   Acute blood loss anemia   Diverticulosis of colon  1. GI bleed Patient is in  NAD and stable. She is asymptomatic. She has not noticed any more bleeding. Her BP is softer today at 92/44 with MAP 59. CBC yesterday showed Hgb 8.6. Colonoscopy yesterday showed moderate diverticulosis with no acute bleed. Overall clinical picture suggests resolved diverticular bleed. -CBC to assess Hgb -IV bolus LR 500 cc for MAP <65, will reassess BP -Hold coumadin 5 more days -Transition to heart diet -Expected discharge this evening or tomorrow morning depending on CBC, BP   LOS: 3 days   2107, Medical Student 12/16/2020, 11:20 AM Pager: [phone] After 5pm on weekdays and 1pm on weekends: On Call pager [phone]

## 2020-12-16 NOTE — Progress Notes (Signed)
CSW contacted by MD, pt requesting ambulance transport home.  [facility] contacted to provide transportation. Daleen Squibb, MSW, LCSW 5/12/20223:40 PM

## 2020-12-16 NOTE — Discharge Instructions (Signed)
Zoe Cobb, it was a pleasure getting to know you and caring for you. You were admitted to [facility] for evaluation of a GI bleed on 12/12/2020. Your colonoscopy showed evidence of diverticulosis (small out-pouchings of the colon partially due to increased pressure from straining on the toilet and constipation) without any active bleeding. Bleeding from these diverticula can occur, and like in your case, they typically resolve on their own. You also had some blood transfusions and fluids while in the hospital to bring up your blood counts and blood pressure. Here are your discharge instructions:  1. You will need to repeat colonoscopy in 5 years to ensure your colon is healthy. 2. Do not take your coumadin (warfarin) again until 12/20/20. 3. Continue eating lots of vegetables and fruits to increase fiber intake. This will help reduce constipation and pressure in your colon that can complicate your diverticulosis. 4. Also continue taking Senokot to help prevent constipation. 5. Do not take your torsemide, Imdur, or metoprolol again until you notice your blood pressure being consistently higher, such as above 140s/90s. Be sure to take your blood pressure at home to monitor when you should restart this medication. 6. Follow up with your primary care doctor to assess how you are feeling in one week. 7. If you experience more bleeding, abdominal pain, fever, nausea, or vomiting, please call your primary care doctor or present to the Emergency Department for evaluation.    Gastrointestinal Bleeding Gastrointestinal (GI) bleeding is bleeding somewhere along the digestive tract, between the mouth and the anus. This tract includes the mouth, esophagus, stomach, small intestine, large intestine, and anus. The large intestine is often called the colon. GI bleeding can be caused by various problems. The severity of these problems can range from mild to serious or even life-threatening. If you have GI  bleeding, you may find blood in your stools (feces), you may have black stools, or you may vomit blood. If there is a lot of bleeding, you may need to stay in the hospital. What are the causes? This condition may be caused by:  Inflammation, irritation, or swelling of the esophagus (esophagitis). The esophagus is part of the body that moves food from your mouth to your stomach.  Swollen veins in the rectum (hemorrhoids).  Areas of painful tearing in the anus that are often caused by passing hard stool (anal fissures).  Pouches that form on the colon over time, with age, and may bleed a lot (diverticulosis).  Inflammation (diverticulitis) in areas with diverticulosis. This can cause pain, fever, and bloody stools, although bleeding may be mild.  Growths (polyps) or cancer. Colon cancer often starts out as precancerous polyps.  Gastritis and ulcers. With these, bleeding may come from the upper GI tract, near the stomach. What increases the risk? You are more likely to develop this condition if you:  Have an infection in your stomach from a type of bacteria called Helicobacter pylori.  Take certain medicines, such as: ? NSAIDs. ? Aspirin. ? Selective serotonin reuptake inhibitors (SSRIs). ? Steroids. ? Antiplatelet or anticoagulant medicines.  Smoke.  Drink alcohol. What are the signs or symptoms? Common symptoms of this condition include:  Bright red blood in your vomit, or vomit that looks like coffee grounds.  Bloody, black, or tarry stools. ? Bleeding from the lower GI tract will usually cause red or maroon blood in the stools. ? Bleeding from the upper GI tract may cause black, tarry stools that are often stronger smelling than usual. ?  In certain cases, if the bleeding is fast enough, the stools may be red.  Pain or cramping in the abdomen. How is this diagnosed? This condition may be diagnosed based on:  Your medical history and a physical exam.  Various tests,  such as: ? Blood tests. ? Stool tests. ? X-rays and other imaging tests. ? Esophagogastroduodenoscopy (EGD). In this test, a flexible, lighted tube is used to look at your esophagus, stomach, and small intestine. ? Colonoscopy. In this test, a flexible, lighted tube is used to look at your colon. How is this treated? Treatment for this condition depends on the cause of the bleeding. For example:  For bleeding from the esophagus, stomach, small intestine, or colon, the health care provider doing your EGD or colonoscopy may be able to stop the bleeding as part of the procedure.  Inflammation or infection of the colon can be treated with medicines.  Certain rectal problems can be treated with creams, suppositories, or warm baths.  Medicines may be given to reduce acid in your stomach.  Surgery is sometimes needed.  Blood transfusions are sometimes needed if a lot of blood has been lost. If bleeding is mild, you may be allowed to go home. If there is a lot of bleeding, you will need to stay in the hospital for observation. Follow these instructions at home:  Take over-the-counter and prescription medicines only as told by your health care provider.  Eat foods that are high in fiber, such as beans, whole grains, and fresh fruits and vegetables. This will help to keep your stools soft. Eating 1-3 prunes each day works well for many people.  Drink enough fluid to keep your urine pale yellow.  Keep all follow-up visits as told by your health care provider. This is important.   Contact a health care provider if:  Your symptoms do not improve. Get help right away if:  Your bleeding does not stop.  You feel light-headed or you faint.  You feel weak.  You have severe cramps in your back or abdomen.  You pass large blood clots in your stool.  Your symptoms are getting worse.  You have chest pain or fast heartbeats. Summary  Gastrointestinal (GI) bleeding is bleeding somewhere  along the digestive tract, between the mouth and anus. GI bleeding can be caused by various problems. The severity of these problems can range from mild to serious or even life-threatening.  Treatment for this condition depends on the cause of the bleeding.  Take over-the-counter and prescription medicines only as told by your health care provider.  Keep all follow-up visits as told by your health care provider. This is important.  Get help right away if your bleeding increases, your symptoms are getting worse, or you have new symptoms. This information is not intended to replace advice given to you by your health care provider. Make sure you discuss any questions you have with your health care provider. Document Revised: 03/06/2018 Document Reviewed: 03/06/2018 Elsevier Patient Education  2021 [employer].

## 2020-12-16 NOTE — Discharge Summary (Addendum)
Name: Zoe Cobb MRN: [id number] DOB: 09/06/1955 65 y.o. PCP: Marletta Lor, NP  Date of Admission: 12/12/2020  8:09 AM Date of Discharge: 12/16/2020 Attending Physician: Gust Rung, DO  Discharge Diagnosis: 1. GI bleed 2/2 diverticulosis  Discharge Medications: Allergies as of 12/16/2020      Reactions   Penicillins Swelling   Has patient had a PCN reaction causing immediate rash, facial/tongue/throat swelling, SOB or lightheadedness with hypotension: Yes Has patient had a PCN reaction causing severe rash involving mucus membranes or skin necrosis: No Has patient had a PCN reaction that required hospitalization No Has patient had a PCN reaction occurring within the last 10 years: No If all of the above answers are "NO", then may proceed with Cephalosporin use.      Medication List    STOP taking these medications   isosorbide mononitrate 60 MG 24 hr tablet Commonly known as: IMDUR   metoprolol succinate 50 MG 24 hr tablet Commonly known as: TOPROL-XL   polyethylene glycol 17 g packet Commonly known as: MIRALAX / GLYCOLAX   torsemide 20 MG tablet Commonly known as: DEMADEX   traMADol 50 MG tablet Commonly known as: ULTRAM   warfarin 5 MG tablet Commonly known as: COUMADIN     TAKE these medications   cholecalciferol 1000 units tablet Commonly known as: VITAMIN D Take 1,000 Units by mouth daily.   senna-docusate 8.6-50 MG tablet Commonly known as: Senokot-S Take 1 tablet by mouth at bedtime as needed for mild constipation.   vitamin B-12 1000 MCG tablet Commonly known as: CYANOCOBALAMIN Take 1,000 mcg by mouth daily.            Discharge Care Instructions  (From admission, onward)         Start     Ordered   12/16/20 0000  No dressing needed        12/16/20 1436          Disposition and follow-up:   Ms.Donika Lomas was discharged from [facility] in Good condition. At the hospital follow up visit please  address:  1.  GI bleed: any noticeable bleeding per rectum, abdominal pain, low BP, dizziness, headaches, fevers, chills, nausea, vomiting, diarrhea, or constipation  2.  Labs / imaging needed at time of follow-up: CBC, BMP, INR  3.  Pending labs/ test needing follow-up: None  Follow-up Appointments: Follow up with primary care doctor  Hospital Course by problem list:  1. GI bleeding Patient presented to the ED with complaints of vaginal bleeding. Dark red blood appreciated around rectum only. Labs were notable for mild leukocytosis with WBC 11.2, normocytic anemia with Hgb at 7.9, INR of 3.4, and positive FOBT. CT A/P not attainable due to body habitus. Admitted for evaluation. Needed 3 transfusions for low Hgb. GI consulted, colonoscopy results demonstrated left side diverticulosis without active bleeding. CBC on 5/12 showed Hgb of 7.4 in the setting of no new bleeds and patient asymptomatic. Stable for discharge.  2. Hypotension Multiple episodes of hypotension over stay. 2 LR boluses needed.  3. A fib Warfarin was stopped during the stay due to bleed and colonoscopy. Patient recommended to restart on 5/16.  Discharge Exam:   BP (!) 101/42   Pulse 85   Temp 98.4 F (36.9 C) (Oral)   Resp 16   Ht 5\' 1"  (1.549 m)   Wt (!) 204.1 kg   SpO2 98%   BMI 85.02 kg/m   Physical Exam Constitutional:  General: She is not in acute distress.    Appearance: Normal appearance.  HENT:     Head: Normocephalic and atraumatic.  Eyes:     Extraocular Movements: Extraocular movements intact.  Cardiovascular:     Rate and Rhythm: Normal rate and regular rhythm.     Heart sounds: Normal heart sounds.  Pulmonary:     Effort: Pulmonary effort is normal.     Breath sounds: Normal breath sounds.  Abdominal:     General: Abdomen is flat.     Palpations: Abdomen is soft.     Tenderness: There is no abdominal tenderness.  Musculoskeletal:     Cervical back: Normal range of motion and neck  supple.  Skin:    General: Skin is warm and dry.  Neurological:     General: No focal deficit present.     Mental Status: She is alert and oriented to person, place, and time. Mental status is at baseline.  Psychiatric:        Mood and Affect: Mood normal.        Behavior: Behavior normal.        Thought Content: Thought content normal.        Judgment: Judgment normal.    Pertinent Labs, Studies, and Procedures:   Colonoscopy Impression: - Preparation of the colon was fair. - Moderate diverticulosis in the left colon. There was evidence of an impacted diverticulum. - There was no evidence of diverticular bleeding. - The examination was otherwise normal on direct and retroflexion views. - No specimens collected.  Discharge Instructions: Discharge Instructions    Call MD for:  severe uncontrolled pain   Complete by: As directed    Call MD for:  temperature >100.4   Complete by: As directed    Diet - low sodium heart healthy   Complete by: As directed    Increase activity slowly   Complete by: As directed    No dressing needed   Complete by: As directed      Ms. Hard, it was a pleasure getting to know you and caring for you. You were admitted to [facility] for evaluation of a GI bleed on 12/12/2020. Your colonoscopy showed evidence of diverticulosis (small out-pouchings of the colon partially due to increased pressure from straining on the toilet and constipation) without any active bleeding. Bleeding from these diverticula can occur, and like in your case, they typically resolve on their own. You also had some blood transfusions and fluids while in the hospital to bring up your blood counts and blood pressure. Here are your discharge instructions:  1. You will need to repeat colonoscopy in 5 years to ensure your colon is healthy. 2. Do not take your coumadin (warfarin) again until 12/20/20. 3. Continue eating lots of vegetables and fruits to increase fiber intake.  This will help reduce constipation and pressure in your colon that can complicate your diverticulosis. 4. Also continue taking Senokot to help prevent constipation. 5. Do not take your torsemide, Imdur, or metoprolol again until you notice your blood pressure being consistently higher, such as above 140s/90s. Be sure to take your blood pressure at home to monitor when you should restart this medication. 6. Follow up with your primary care doctor to assess how you are feeling in one week. 7. If you experience more bleeding, abdominal pain, fever, nausea, or vomiting, please call your primary care doctor or present to the Emergency Department for evaluation.  Signed: Samantha Crimes, Medical Student 12/16/2020,  3:22 PM   Pager: [phone] After 5pm on weekdays and 1pm on weekends: On Call pager [phone]  Attestation for Student Documentation:  I personally was present and performed or re-performed the history, physical exam and medical decision-making activities of this service and have verified that the service and findings are accurately documented in the student's note.  Karsten Ro, MD 12/16/2020, 3:28 PM

## 2020-12-21 ENCOUNTER — Ambulatory Visit: Payer: 59 | Admitting: Pharmacist

## 2020-12-21 ENCOUNTER — Other Ambulatory Visit: Payer: Self-pay

## 2020-12-21 DIAGNOSIS — I480 Paroxysmal atrial fibrillation: Secondary | ICD-10-CM

## 2020-12-21 DIAGNOSIS — Z7901 Long term (current) use of anticoagulants: Secondary | ICD-10-CM

## 2020-12-21 DIAGNOSIS — Z5181 Encounter for therapeutic drug level monitoring: Secondary | ICD-10-CM

## 2020-12-21 LAB — POCT INR: INR: 1.1 — AB (ref 2.0–3.0)

## 2020-12-21 NOTE — Patient Instructions (Signed)
INR below goal. Take 5 mg today and retry previous maintenance dose of of 2.5 mg every Tues/Thu/Sat and 5 mg all other days. Recheck INR in 1 week.

## 2020-12-21 NOTE — Progress Notes (Signed)
Anticoagulation Management Zoe Cobb is a 65 y.o. female who reports to the clinic for monitoring of warfarin treatment.   Indication: atrial fibrillation CHA2DS2 Vasc Score 3 (Age 66-74, Female, CHF hx), HAS-BLED 1 (Age >29)  Duration: indefinite Supervising physician: Yates Decamp  Anticoagulation Clinic Visit History:  Patient does not report signs/symptoms of bleeding or thromboembolism  Other recent changes:   GIB hospital admission 5/18-5/13. FOBT positive. INR upon admission of 3.4. Warfarin held since 5/8-5/16. Vit K 2.5 mg administered on 12/13/20. Colonoscopy on 12/15/20 showed moderate impacted diverticulosis in left colon. No evidence of active GI bleed. Repeat colonoscopy recommended in 5 year.   Pt denies any current bleeding symptoms.   Anticoagulation Episode Summary    Current INR goal:  2.0-3.0  TTR:  53.0 % (2.2 y)  Next INR check:  12/27/2020  INR from last check:  1.1 (12/21/2020)  Weekly max warfarin dose:    Target end date:    INR check location:    Preferred lab:    Send INR reminders to:     Indications   Atrial fibrillation (HCC) [I48.91] Monitoring for long-term anticoagulant use [Z51.81 Z79.01]       Comments:          Allergies  Allergen Reactions  . Penicillins Swelling    Has patient had a PCN reaction causing immediate rash, facial/tongue/throat swelling, SOB or lightheadedness with hypotension: Yes Has patient had a PCN reaction causing severe rash involving mucus membranes or skin necrosis: No Has patient had a PCN reaction that required hospitalization No Has patient had a PCN reaction occurring within the last 10 years: No If all of the above answers are "NO", then may proceed with Cephalosporin use.     Current Outpatient Medications:  .  cholecalciferol (VITAMIN D) 1000 units tablet, Take 1,000 Units by mouth daily., Disp: , Rfl:  .  senna-docusate (SENOKOT-S) 8.6-50 MG tablet, Take 1 tablet by mouth at bedtime as needed for  mild constipation., Disp: 30 tablet, Rfl: 0 .  vitamin B-12 (CYANOCOBALAMIN) 1000 MCG tablet, Take 1,000 mcg by mouth daily., Disp: , Rfl:  Past Medical History:  Diagnosis Date  . CHF (congestive heart failure) (HCC)   . Heart failure (HCC)   . Hypertension   . Morbid obesity (HCC)   . Paroxysmal atrial fibrillation (HCC)     ASSESSMENT  Recent Results: The most recent result is correlated with 27.5 mg per week:  Lab Results  Component Value Date   INR 1.1 (A) 12/21/2020   INR 1.1 12/14/2020   INR 3.2 (H) 12/13/2020    Anticoagulation Dosing: Description   INR below goal. Take 5 mg today and retry previous maintenance dose of of 2.5 mg every Tues/Thu/Sat and 5 mg all other days. Recheck INR in 1 week.       INR today: Subtherapeutic. In setting of held warfarin doses and recent Phytonadione 2.5 mg x 1. Recent concerns of GI bleeds and pt with history of vaginal bleed in the past. Unclear reason for recent INR elevations. Pt was previously controlled and at goal. Pt denies any complains of active bleeding or bruising symptoms. Denies any new medications, diet, or lifestyle changes. Will dose adjust and continue close monitoring.   PLAN Weekly dose was unchanged by 0% to 27.5 mg/week. Take 5 mg today and then retry previous weekly dose of 2.5 mg every Tues, Thurs, Sat and 5 mg all other days. Recheck INR in 1 weeks.   Patient Instructions  INR below goal. Take 5 mg today and retry previous maintenance dose of of 2.5 mg every Tues/Thu/Sat and 5 mg all other days. Recheck INR in 1 week.   Patient advised to contact clinic or seek medical attention if signs/symptoms of bleeding or thromboembolism occur.  Patient verbalized understanding by repeating back information and was advised to contact me if further medication-related questions arise.   Follow-up Return in about 6 days (around 12/27/2020).  Leonides Schanz, PharmD  15 minutes spent face-to-face with the patient during  the encounter. 50% of time spent on education, including signs/sx bleeding and clotting, as well as food and drug interactions with warfarin. 50% of time was spent on fingerprick POC INR sample collection,processing, results determination, and documentation

## 2020-12-30 ENCOUNTER — Other Ambulatory Visit: Payer: Self-pay

## 2020-12-30 ENCOUNTER — Ambulatory Visit: Payer: 59 | Admitting: Pharmacist

## 2020-12-30 DIAGNOSIS — Z5181 Encounter for therapeutic drug level monitoring: Secondary | ICD-10-CM

## 2020-12-30 DIAGNOSIS — Z7901 Long term (current) use of anticoagulants: Secondary | ICD-10-CM

## 2020-12-30 DIAGNOSIS — I480 Paroxysmal atrial fibrillation: Secondary | ICD-10-CM

## 2020-12-30 LAB — POCT INR: INR: 1.7 — AB (ref 2.0–3.0)

## 2020-12-30 NOTE — Patient Instructions (Signed)
INR below goal. Take 5 mg today and continue previous maintenance dose of 2.5 mg every Tues/Thu/Sat and 5 mg all other days. Recheck INR in 2 week.

## 2020-12-30 NOTE — Progress Notes (Signed)
Anticoagulation Management Zoe Cobb is a 65 y.o. female who reports to the clinic for monitoring of warfarin treatment.   Indication: atrial fibrillation CHA2DS2 Vasc Score 3 (Age 67-74, Female, CHF hx), HAS-BLED 1 (Age >56)  Duration: indefinite Supervising physician: Yates Decamp  Anticoagulation Clinic Visit History:  Patient does not report signs/symptoms of bleeding or thromboembolism  Other recent changes:   GIB hospital admission 5/18-5/13. FOBT positive. INR upon admission of 3.4. Warfarin held since 5/8-5/16. Vit K 2.5 mg administered on 12/13/20. Colonoscopy on 12/15/20 showed moderate impacted diverticulosis in left colon. No evidence of active GI bleed. Repeat colonoscopy recommended in 5 year.   Continues to deny any active bleeding symptoms. Continues to eat collard greens and broccoli daily.    Anticoagulation Episode Summary    Current INR goal:  2.0-3.0  TTR:  52.4 % (2.2 y)  Next INR check:  01/13/2021  INR from last check:  1.7 (12/30/2020)  Weekly max warfarin dose:    Target end date:    INR check location:    Preferred lab:    Send INR reminders to:     Indications   Atrial fibrillation (HCC) [I48.91] Monitoring for long-term anticoagulant use [Z51.81 Z79.01]       Comments:          Allergies  Allergen Reactions  . Penicillins Swelling    Has patient had a PCN reaction causing immediate rash, facial/tongue/throat swelling, SOB or lightheadedness with hypotension: Yes Has patient had a PCN reaction causing severe rash involving mucus membranes or skin necrosis: No Has patient had a PCN reaction that required hospitalization No Has patient had a PCN reaction occurring within the last 10 years: No If all of the above answers are "NO", then may proceed with Cephalosporin use.     Current Outpatient Medications:  .  cholecalciferol (VITAMIN D) 1000 units tablet, Take 1,000 Units by mouth daily., Disp: , Rfl:  .  senna-docusate (SENOKOT-S) 8.6-50 MG  tablet, Take 1 tablet by mouth at bedtime as needed for mild constipation., Disp: 30 tablet, Rfl: 0 .  vitamin B-12 (CYANOCOBALAMIN) 1000 MCG tablet, Take 1,000 mcg by mouth daily., Disp: , Rfl:  Past Medical History:  Diagnosis Date  . CHF (congestive heart failure) (HCC)   . Heart failure (HCC)   . Hypertension   . Morbid obesity (HCC)   . Paroxysmal atrial fibrillation (HCC)     ASSESSMENT  Recent Results: The most recent result is correlated with 27.5 mg per week:  Lab Results  Component Value Date   INR 1.7 (A) 12/30/2020   INR 1.1 (A) 12/21/2020   INR 1.1 12/14/2020    Anticoagulation Dosing: Description   INR below goal. Take 5 mg today and continue previous maintenance dose of 2.5 mg every Tues/Thu/Sat and 5 mg all other days. Recheck INR in 2 week.      INR today: Subtherapeutic. INR trending up following recent held doses in setting of recent GIB symptoms. Pt reports that she is staying consistent with her Vit K intake, warfarin adherence. Denies any complains of bleeding or bruising symptoms. Denies any other relevant changes in her diet, medications, or lifestyle. Will have pt decrease her high Vit K intake to 3-4 servings/weeks. Will boost in the meantime and continue close monitoring.   PLAN Weekly dose was unchanged by 0% to 27.5 mg/week. Take 5 mg today and then continue weekly dose of 2.5 mg every Tues, Thurs, Sat and 5 mg all other days. Recheck INR  in 2 weeks.   Patient Instructions  INR below goal. Take 5 mg today and continue previous maintenance dose of 2.5 mg every Tues/Thu/Sat and 5 mg all other days. Recheck INR in 2 week.   Patient advised to contact clinic or seek medical attention if signs/symptoms of bleeding or thromboembolism occur.  Patient verbalized understanding by repeating back information and was advised to contact me if further medication-related questions arise.   Follow-up Return in about 2 weeks (around 01/13/2021).  Leonides Schanz,  PharmD  15 minutes spent face-to-face with the patient during the encounter. 50% of time spent on education, including signs/sx bleeding and clotting, as well as food and drug interactions with warfarin. 50% of time was spent on fingerprick POC INR sample collection,processing, results determination, and documentation

## 2021-01-16 ENCOUNTER — Emergency Department (HOSPITAL_BASED_OUTPATIENT_CLINIC_OR_DEPARTMENT_OTHER)
Admission: EM | Admit: 2021-01-16 | Discharge: 2021-01-16 | Disposition: A | Discharge status: Home / Self Care | Payer: 59 | Attending: Emergency Medicine | Admitting: Emergency Medicine

## 2021-01-16 ENCOUNTER — Emergency Department (HOSPITAL_BASED_OUTPATIENT_CLINIC_OR_DEPARTMENT_OTHER): Payer: 59 | Admitting: Radiology

## 2021-01-16 ENCOUNTER — Encounter (HOSPITAL_BASED_OUTPATIENT_CLINIC_OR_DEPARTMENT_OTHER): Payer: Self-pay | Admitting: Emergency Medicine

## 2021-01-16 ENCOUNTER — Emergency Department (HOSPITAL_BASED_OUTPATIENT_CLINIC_OR_DEPARTMENT_OTHER): Payer: 59

## 2021-01-16 DIAGNOSIS — I110 Hypertensive heart disease with heart failure: Secondary | ICD-10-CM | POA: Insufficient documentation

## 2021-01-16 DIAGNOSIS — R109 Unspecified abdominal pain: Secondary | ICD-10-CM

## 2021-01-16 DIAGNOSIS — Z79899 Other long term (current) drug therapy: Secondary | ICD-10-CM | POA: Diagnosis not present

## 2021-01-16 DIAGNOSIS — I5032 Chronic diastolic (congestive) heart failure: Secondary | ICD-10-CM | POA: Diagnosis not present

## 2021-01-16 DIAGNOSIS — Z87891 Personal history of nicotine dependence: Secondary | ICD-10-CM | POA: Diagnosis not present

## 2021-01-16 DIAGNOSIS — Z7901 Long term (current) use of anticoagulants: Secondary | ICD-10-CM | POA: Insufficient documentation

## 2021-01-16 DIAGNOSIS — N390 Urinary tract infection, site not specified: Secondary | ICD-10-CM

## 2021-01-16 HISTORY — DX: Multiple sclerosis: G35

## 2021-01-16 LAB — COMPREHENSIVE METABOLIC PANEL
ALT: <5 U/L (ref 0–44)
AST: 20 U/L (ref 15–41)
Albumin: 3 g/dL — ABNORMAL LOW (ref 3.5–5.0)
Alkaline Phosphatase: 114 U/L (ref 38–126)
Anion gap: 10 (ref 5–15)
BUN: 22 mg/dL (ref 8–23)
CO2: 26 mmol/L (ref 22–32)
Calcium: 8.6 mg/dL — ABNORMAL LOW (ref 8.9–10.3)
Chloride: 100 mmol/L (ref 98–111)
Creatinine, Ser: 0.96 mg/dL (ref 0.44–1.00)
GFR, Estimated: >60 mL/min (ref >60)
Glucose, Bld: 130 mg/dL — ABNORMAL HIGH (ref 70–99)
Potassium: 4.6 mmol/L (ref 3.5–5.1)
Sodium: 136 mmol/L (ref 135–145)
Total Bilirubin: 0.4 mg/dL (ref 0.3–1.2)
Total Protein: 8.3 g/dL — ABNORMAL HIGH (ref 6.5–8.1)

## 2021-01-16 LAB — URINALYSIS, ROUTINE W REFLEX MICROSCOPIC
Bilirubin Urine: NEGATIVE
Glucose, UA: NEGATIVE mg/dL
Ketones, ur: NEGATIVE mg/dL
Nitrite: NEGATIVE
Specific Gravity, Urine: 1.013 (ref 1.005–1.030)
WBC, UA: >50 WBC/hpf — ABNORMAL HIGH (ref 0–5)
pH: 6 (ref 5.0–8.0)

## 2021-01-16 LAB — CBC
HCT: 32.5 % — ABNORMAL LOW (ref 36.0–46.0)
Hemoglobin: 9.4 g/dL — ABNORMAL LOW (ref 12.0–15.0)
MCH: 23 pg — ABNORMAL LOW (ref 26.0–34.0)
MCHC: 28.9 g/dL — ABNORMAL LOW (ref 30.0–36.0)
MCV: 79.5 fL — ABNORMAL LOW (ref 80.0–100.0)
Platelets: 386 10*3/uL (ref 150–400)
RBC: 4.09 MIL/uL (ref 3.87–5.11)
RDW: 18 % — ABNORMAL HIGH (ref 11.5–15.5)
WBC: 10 10*3/uL (ref 4.0–10.5)
nRBC: 0 % (ref 0.0–0.2)

## 2021-01-16 MED ORDER — ONDANSETRON HCL 4 MG/2ML IJ SOLN
4.0000 mg | Freq: Once | INTRAMUSCULAR | Status: AC
  Administered 2021-01-16: 4 mg via INTRAVENOUS
  Filled 2021-01-16: qty 2

## 2021-01-16 MED ORDER — HYDROMORPHONE HCL 1 MG/ML IJ SOLN
1.0000 mg | Freq: Once | INTRAMUSCULAR | Status: AC
  Administered 2021-01-16: 1 mg via INTRAVENOUS
  Filled 2021-01-16: qty 1

## 2021-01-16 MED ORDER — CEFDINIR 300 MG PO CAPS: 300.0000 mg | ORAL_CAPSULE | Freq: Two times a day (BID) | ORAL | 0 refills | Status: DC

## 2021-01-16 MED ORDER — SODIUM CHLORIDE 0.9 % IV SOLN
INTRAVENOUS | Status: DC | PRN
  Administered 2021-01-16: 250 mL via INTRAVENOUS

## 2021-01-16 MED ORDER — SODIUM CHLORIDE 0.9 % IV SOLN
1.0000 g | Freq: Once | INTRAVENOUS | Status: AC
  Administered 2021-01-16: 1 g via INTRAVENOUS
  Filled 2021-01-16: qty 10

## 2021-01-16 NOTE — ED Provider Notes (Addendum)
[facility] EMERGENCY DEPT Provider Note   CSN: [id number] Arrival date & time: 01/16/21  1448     History Chief Complaint  Patient presents with   Abdominal Pain    Zoe Cobb is a 65 y.o. female.  Patient c/o left lower abdomen, left flank pain for past day. Symptoms acute onset, moderate, persistent, non radiating, constant. Denies hx same pain. No hx kidney stones. Hx diverticulosis - states recent gi bleeding for same, is back on anticoagulant therapy, no abn bruising or bleeding, no melena or rectal bleeding. Denies hematuria or dysuria. No vaginal discharge or bleeding. Having normal bms, no diarrhea or constipation. No nausea/vomiting. No abd distension. No fever or chills. No leg pain or radicular pain. Denies trauma, fall, or side strain.   The history is provided by the patient.  Abdominal Pain Associated symptoms: no chest pain, no chills, no constipation, no cough, no diarrhea, no dysuria, no fever, no hematuria, no shortness of breath, no sore throat, no vaginal bleeding, no vaginal discharge and no vomiting       Past Medical History:  Diagnosis Date   CHF (congestive heart failure) (HCC)    Heart failure (HCC)    Hypertension    Morbid obesity (HCC)    Multiple sclerosis (HCC)    Paroxysmal atrial fibrillation [facility])     Patient Active Problem List   Diagnosis Date Noted   Diverticulosis of colon 12/15/2020   Acute blood loss anemia 12/13/2020   GI bleed 12/12/2020   Anemia    Hematochezia    Lower abdominal pain    Monitoring for long-term anticoagulant use 03/17/2020   Atrial fibrillation (HCC) 10/31/2018   Long term (current) use of anticoagulants 10/05/2018   Laboratory examination 10/02/2018   Anticoagulated    Peripheral edema    Thrombocytopenia (HCC)    Chronic diastolic (congestive) heart failure (HCC) 09/27/2016   Swelling of left lower extremity 09/27/2016   Paroxysmal atrial fibrillation (HCC) 09/27/2016   AKI (acute  kidney injury) (HCC) 09/27/2016   Shortness of breath 08/28/2016   Pressure injury of skin 08/28/2016   Morbid obesity due to excess calories (HCC) 06/28/2015    Past Surgical History:  Procedure Laterality Date   COLONOSCOPY WITH PROPOFOL N/A 12/15/2020   Procedure: COLONOSCOPY WITH PROPOFOL;  Surgeon: Meryl Dare, MD;  Location: [facility] ENDOSCOPY;  Service: Endoscopy;  Laterality: N/A;     OB History   No obstetric history on file.     Family History  Problem Relation Age of Onset   Hypertension Mother    Diabetes Mother    Stomach cancer Father    Kidney disease Sister    Diabetes Sister    Diabetes Sister    Heart attack Brother    HIV/AIDS Brother    Diabetes Brother     Social History   Tobacco Use   Smoking status: Former    Packs/day: 1.00    Years: 9.00    Pack years: 9.00    Types: Cigarettes    Quit date: 1996    Years since quitting: 26.4   Smokeless tobacco: Never  Vaping Use   Vaping Use: Never used  Substance Use Topics   Alcohol use: No    Alcohol/week: 0.0 standard drinks   Drug use: No    Home Medications Prior to Admission medications   Medication Sig Start Date End Date Taking? Authorizing Provider  furosemide (LASIX) 40 MG tablet Take 40 mg by mouth daily.   Yes [provider]  warfarin (COUMADIN) 1 MG tablet Take 1 mg by mouth daily. T,wed,Thursday [address] [place] Recent Vital Signs  @VS @   Past Medical History: No date: CHF (congestive heart failure) (HCC) No date: Heart failure (HCC) No date: Hypertension No date: Morbid obesity (HCC)   mg   Yes [provider]  cholecalciferol (VITAMIN D) 1000 units tablet Take 1,000 Units by mouth daily.    [provider]  senna-docusate (SENOKOT-S) 8.6-50 MG tablet Take 1 tablet by mouth at bedtime as needed for mild constipation. 12/16/20   02/15/21, MD  vitamin B-12 (CYANOCOBALAMIN) 1000 MCG tablet Take 1,000 mcg by mouth daily.    [provider]    Allergies    Penicillins  Review of Systems   Review of Systems  Constitutional:  Negative for chills and fever.  HENT:  Negative for sore throat.   Eyes:  Negative for redness.  Respiratory:  Negative for cough and shortness of breath.   Cardiovascular:  Negative for chest pain.  Gastrointestinal:  Positive for abdominal pain. Negative for constipation, diarrhea and vomiting.  Genitourinary:  Positive for flank pain. Negative for dysuria, hematuria, vaginal bleeding and vaginal discharge.  Musculoskeletal:  Negative for neck pain.  Skin:  Negative for rash.  Neurological:  Negative for weakness, numbness and headaches.  Hematological:  Does not bruise/bleed easily.  Psychiatric/Behavioral:  Negative for confusion.    Physical Exam Updated Vital Signs BP 119/85   Pulse 81   Temp 99 F (37.2 C) (Oral)   Resp 18   Wt (!) 203.8 kg   SpO2 97%   BMI 84.89 kg/m   Physical Exam Vitals and nursing note reviewed.  Constitutional:      Appearance: Normal appearance. She is well-developed.  HENT:     Head: Atraumatic.     Nose: Nose normal.     Mouth/Throat:     Mouth: Mucous membranes are moist.  Eyes:     General: No scleral icterus.    Conjunctiva/sclera: Conjunctivae normal.  Neck:     Trachea: No tracheal deviation.  Cardiovascular:     Rate and Rhythm: Normal rate and regular rhythm.     Pulses: Normal pulses.     Heart sounds: Normal heart sounds. No murmur heard.   No friction rub. No gallop.  Pulmonary:     Effort: Pulmonary effort is normal. No respiratory distress.     Breath sounds: Normal breath sounds.  Abdominal:     General: Bowel sounds are normal. There is no distension.     Palpations: Abdomen is soft. There is no mass.     Tenderness: There is no abdominal tenderness. There is no guarding or rebound.     Hernia: No hernia is present.     Comments: Morbidly obese.   Genitourinary:    Comments:  ?mild left cva tenderness.  Musculoskeletal:         General: No tenderness.     Cervical back: Normal range of motion and neck supple. No rigidity. No muscular tenderness.  Skin:    General: Skin is warm and dry.     Findings: No rash.     Comments: No skin lesions/rash in area of pain.   Neurological:     Mental Status: She is alert.     Comments: Alert, speech normal.   Psychiatric:        Mood and Affect: Mood normal.    ED Results / Procedures / Treatments  Labs (all labs ordered are listed, but only abnormal results are displayed) Results for orders placed or performed during the hospital encounter of 01/16/21  CBC  Result Value Ref Range   WBC 10.0 4.0 - 10.5 K/uL   RBC 4.09 3.87 - 5.11 MIL/uL   Hemoglobin 9.4 (L) 12.0 - 15.0 g/dL   HCT 33.2 (L) 95.1 - 88.4 %   MCV 79.5 (L) 80.0 - 100.0 fL   MCH 23.0 (L) 26.0 - 34.0 pg   MCHC 28.9 (L) 30.0 - 36.0 g/dL   RDW 16.6 (H) 06.3 - 01.6 %   Platelets 386 150 - 400 K/uL   nRBC 0.0 0.0 - 0.2 %  Urinalysis, Routine w reflex microscopic Urine, Clean Catch  Result Value Ref Range   Color, Urine YELLOW YELLOW   APPearance HAZY (A) CLEAR   Specific Gravity, Urine 1.013 1.005 - 1.030   pH 6.0 5.0 - 8.0   Glucose, UA NEGATIVE NEGATIVE mg/dL   Hgb urine dipstick TRACE (A) NEGATIVE   Bilirubin Urine NEGATIVE NEGATIVE   Ketones, ur NEGATIVE NEGATIVE mg/dL   Protein, ur TRACE (A) NEGATIVE mg/dL   Nitrite NEGATIVE NEGATIVE   Leukocytes,Ua LARGE (A) NEGATIVE   RBC / HPF 11-20 0 - 5 RBC/hpf   WBC, UA >50 (H) 0 - 5 WBC/hpf   Bacteria, UA MANY (A) NONE SEEN   Squamous Epithelial / LPF 0-5 0 - 5   WBC Clumps PRESENT    Hyaline Casts, UA PRESENT   Comprehensive metabolic panel  Result Value Ref Range   Sodium 136 135 - 145 mmol/L   Potassium 4.6 3.5 - 5.1 mmol/L   Chloride 100 98 - 111 mmol/L   CO2 26 22 - 32 mmol/L   Glucose, Bld 130 (H) 70 - 99 mg/dL   BUN 22 8 - 23 mg/dL   Creatinine, Ser 0.10 0.44 - 1.00 mg/dL   Calcium 8.6 (L) 8.9 - 10.3 mg/dL   Total Protein 8.3 (H)  6.5 - 8.1 g/dL   Albumin 3.0 (L) 3.5 - 5.0 g/dL   AST 20 15 - 41 U/L   ALT <5 0 - 44 U/L   Alkaline Phosphatase 114 38 - 126 U/L   Total Bilirubin 0.4 0.3 - 1.2 mg/dL   GFR, Estimated >93 >23 mL/min   Anion gap 10 5 - 15     EKG None  Radiology DG ABD ACUTE 2+V W 1V CHEST  Result Date: 01/16/2021 CLINICAL DATA:  Severe left lower quadrant abdomen, left flank and back pain. EXAM: DG ABDOMEN ACUTE WITH 1 VIEW CHEST COMPARISON:  None. FINDINGS: Poor inspiration. Enlarged cardiac silhouette. Clear lungs. Thoracic spine and marked bilateral shoulder degenerative changes. Normal bowel gas pattern. No free peritoneal air seen, limited by the large size of the patient. IMPRESSION: 1. Limited examination with no gross acute abnormality. 2. Mild cardiomegaly. Electronically Signed   By: Beckie Salts M.D.   On: 01/16/2021 17:17    Procedures Procedures   Medications Ordered in ED Medications  HYDROmorphone (DILAUDID) injection 1 mg (1 mg Intravenous Given 01/16/21 1547)  ondansetron (ZOFRAN) injection 4 mg (4 mg Intravenous Given 01/16/21 1547)    ED Course  I have reviewed the triage vital signs and the nursing notes.  Pertinent labs & imaging results that were available during my care of the patient were reviewed by me and considered in my medical decision making (see chart for details).    MDM Rules/Calculators/A&P  Labs. Imaging. Dilaudid iv, zofran iv.   Reviewed nursing notes and prior charts for additional history.   Radiology/CT indicates pt size, not able to perform CT.   Labs reviewed/interpreted by me - wbc normal. Additional labs reviewed/interpreted by me - ua w > 50 wbc, c/w uti. Urine culture sent. Rocephin iv.   Recheck pt, pain controlled. Abd soft non tender. No vomiting. Pt is drinking fluids, states feels ready for d/c.   Patient currently appears stable for d/c.   Rec close pcp f/u.  Return precautions provided.    Final Clinical  Impression(s) / ED Diagnoses Final diagnoses:  None    Rx / DC Orders ED Discharge Orders     None        Cathren Laine, MD 01/16/21 2030

## 2021-01-16 NOTE — Discharge Instructions (Addendum)
It was our pleasure to provide your ER care today - we hope that you feel better.  Drink plenty of fluids/stay well hydrated.   Your lab tests show a probable urine infection. Take antibiotic as prescribed. Take acetaminophen or ibuprofen as need.   Follow up with primary care doctor in the next 2-3 days if symptoms fail to improve/resolve.  Return to ER right away if worse, new symptoms, high fevers, worsening or severe pain, persistent vomiting, or other concern.   You were given pain medication in the ER - no driving for the next 6 hours.

## 2021-01-16 NOTE — ED Notes (Signed)
Pericare performed, purewick placed and urine sample obtained. Unable to obtain INR blood work, MD aware

## 2021-01-16 NOTE — ED Triage Notes (Signed)
Pt is able to normally ambulate and care for herself. Today reports severe abdominal, left flank/back pain. Denies n/v/d/fever.

## 2021-01-17 ENCOUNTER — Other Ambulatory Visit: Payer: Self-pay | Admitting: Cardiology

## 2021-01-17 DIAGNOSIS — Z7901 Long term (current) use of anticoagulants: Secondary | ICD-10-CM

## 2021-01-17 DIAGNOSIS — I480 Paroxysmal atrial fibrillation: Secondary | ICD-10-CM

## 2021-01-17 DIAGNOSIS — Z5181 Encounter for therapeutic drug level monitoring: Secondary | ICD-10-CM

## 2021-01-17 NOTE — Telephone Encounter (Signed)
Refill request

## 2021-01-19 LAB — URINE CULTURE: Culture: >=100000 — AB

## 2021-01-20 ENCOUNTER — Telehealth: Payer: Self-pay | Admitting: *Deleted

## 2021-01-20 NOTE — Telephone Encounter (Signed)
Post ED Visit - Positive Culture Follow-up  Culture report reviewed by antimicrobial stewardship pharmacist: [facility] []  , Pharm.D. []  Enzo Bi, Pharm.D., BCPS AQ-ID []  , Pharm.D., BCPS []  Celedonio Miyamoto, Pharm.D., BCPS []  Mitchell, Garvin Fila.D., BCPS, AAHIVP []  , Pharm.D., BCPS, AAHIVP []  Georgina Pillion, PharmD, BCPS []  , PharmD, BCPS []  [place], PharmD, BCPS []  [place], PharmD []  , PharmD, BCPS []  Estella Husk, PharmD  Pharmacy Team []  Lysle Pearl, PharmD []  , PharmD []  Phillips Climes, PharmD []  , Rph []  Agapito Games) , PharmD []  Verlan Friends, PharmD []  , PharmD []  Mervyn Gay, PharmD []  , PharmD []  Vinnie Level, PharmD []  [facility], PharmD []  , PharmD []  Len Childs, PharmD   Positive urine culture Treated with cefdinir, organism sensitive to the same and no further patient follow-up is required at this time. , PharmD  Greer Pickerel Talley 01/20/2021, 11:26 AM

## 2021-01-25 ENCOUNTER — Other Ambulatory Visit: Payer: Self-pay

## 2021-01-25 ENCOUNTER — Ambulatory Visit: Payer: 59 | Admitting: Pharmacist

## 2021-01-25 DIAGNOSIS — I480 Paroxysmal atrial fibrillation: Secondary | ICD-10-CM

## 2021-01-25 DIAGNOSIS — Z7901 Long term (current) use of anticoagulants: Secondary | ICD-10-CM

## 2021-01-25 DIAGNOSIS — Z5181 Encounter for therapeutic drug level monitoring: Secondary | ICD-10-CM

## 2021-01-25 LAB — POCT INR: INR: 3.4 — AB (ref 2.0–3.0)

## 2021-01-25 NOTE — Progress Notes (Signed)
Anticoagulation Management Zoe Cobb is a 65 y.o. female who reports to the clinic for monitoring of warfarin treatment.   Indication: atrial fibrillation CHA2DS2 Vasc Score 3 (Age 20-74, Female, CHF hx), HAS-BLED 1 (Age >71)  Duration: indefinite Supervising physician: Yates Decamp  Anticoagulation Clinic Visit History:  Patient does not report signs/symptoms of bleeding or thromboembolism  Other recent changes: Recent ER visit on 6/12 for left flank pain. UTI dx. Pt started on Cefdinir for 7 day course. Last day of Cefdinir on ~01/23/21. Recovering well without any noted complications. Reports that she only had salad this week without any broccoli or collard greens. Planning on ensuring that she maintains 3-4 serving of high Vit K meals/weeks.   Anticoagulation Episode Summary     Current INR goal:  2.0-3.0  TTR:  52.6 % (2.3 y)  Next INR check:  02/15/2021  INR from last check:  3.4 (01/25/2021)  Weekly max warfarin dose:    Target end date:    INR check location:    Preferred lab:    Send INR reminders to:     Indications   Atrial fibrillation (HCC) [I48.91] Monitoring for long-term anticoagulant use [Z51.81 Z79.01]        Comments:           Allergies  Allergen Reactions   Penicillins Swelling    Has patient had a PCN reaction causing immediate rash, facial/tongue/throat swelling, SOB or lightheadedness with hypotension: Yes Has patient had a PCN reaction causing severe rash involving mucus membranes or skin necrosis: No Has patient had a PCN reaction that required hospitalization No Has patient had a PCN reaction occurring within the last 10 years: No If all of the above answers are "NO", then may proceed with Cephalosporin use.     Current Outpatient Medications:    cefdinir (OMNICEF) 300 MG capsule, Take 1 capsule (300 mg total) by mouth 2 (two) times daily., Disp: 14 capsule, Rfl: 0   cholecalciferol (VITAMIN D) 1000 units tablet, Take 1,000 Units by mouth  daily., Disp: , Rfl:    furosemide (LASIX) 40 MG tablet, Take 40 mg by mouth daily., Disp: , Rfl:    senna-docusate (SENOKOT-S) 8.6-50 MG tablet, Take 1 tablet by mouth at bedtime as needed for mild constipation., Disp: 30 tablet, Rfl: 0   vitamin B-12 (CYANOCOBALAMIN) 1000 MCG tablet, Take 1,000 mcg by mouth daily., Disp: , Rfl:    warfarin (COUMADIN) 5 MG tablet, Take 1 tablet (5 mg total) by mouth daily. Refer to most recent anticoagulation note for most updated dosing instructions., Disp: 90 tablet, Rfl: 3 Past Medical History:  Diagnosis Date   CHF (congestive heart failure) (HCC)    Heart failure (HCC)    Hypertension    Morbid obesity (HCC)    Multiple sclerosis (HCC)    Paroxysmal atrial fibrillation (HCC)     ASSESSMENT  Recent Results: The most recent result is correlated with 27.5 mg per week:  Lab Results  Component Value Date   INR 3.4 (A) 01/25/2021   INR 1.7 (A) 12/30/2020   INR 1.1 (A) 12/21/2020    Anticoagulation Dosing: Description   INR above goal. HOLD today and then continue 2.5 mg every Tues, Thur, Sat and 5 mg all other days. Maintain high Vit K intake of 3-4 servings/week. Recheck INR in 3 week.      INR today: Supratherapeutic. INR continues to trend up. Recent Abx possibly also contributing to elevated INR readings. Lower than planned Vit K intake also  affecting supratherapeutic INR readings.  Pt denies any complains of bleeding or bruising. Denies any other changes in diet, medications, or lifestyle. Reviewed the importance on maintaining consistent Vit K intake.    PLAN Weekly dose was unchanged by 0% to 27.5 mg/week. HOLD today and then continue taking 2.5 mg every Tues, Thurs, Sat and 5 mg all other days. Recheck INR in 2 weeks.   Patient Instructions  INR above goal. HOLD today and then continue 2.5 mg every Tues, Thur, Sat and 5 mg all other days. Maintain high Vit K intake of 3-4 servings/week. Recheck INR in 3 week.  Patient advised to contact  clinic or seek medical attention if signs/symptoms of bleeding or thromboembolism occur.  Patient verbalized understanding by repeating back information and was advised to contact me if further medication-related questions arise.   Follow-up Return in about 3 weeks (around 02/15/2021).  Leonides Schanz, PharmD  15 minutes spent face-to-face with the patient during the encounter. 50% of time spent on education, including signs/sx bleeding and clotting, as well as food and drug interactions with warfarin. 50% of time was spent on fingerprick POC INR sample collection,processing, results determination, and documentation

## 2021-01-25 NOTE — Patient Instructions (Signed)
INR above goal. HOLD today and then continue 2.5 mg every Tues, Thur, Sat and 5 mg all other days. Maintain high Vit K intake of 3-4 servings/week. Recheck INR in 3 week.

## 2021-02-15 ENCOUNTER — Ambulatory Visit: Payer: 59 | Admitting: Pharmacist

## 2021-02-15 ENCOUNTER — Other Ambulatory Visit: Payer: Self-pay

## 2021-02-15 DIAGNOSIS — I480 Paroxysmal atrial fibrillation: Secondary | ICD-10-CM

## 2021-02-15 DIAGNOSIS — Z7901 Long term (current) use of anticoagulants: Secondary | ICD-10-CM

## 2021-02-15 DIAGNOSIS — Z5181 Encounter for therapeutic drug level monitoring: Secondary | ICD-10-CM

## 2021-02-15 LAB — POCT INR: INR: 2.9 (ref 2.0–3.0)

## 2021-02-15 NOTE — Patient Instructions (Signed)
INR at goal. Continue 2.5 mg every Tues, Thur, Sat and 5 mg all other days. Maintain high Vit K intake of 4-5 servings/week. Recheck INR in 4 week.

## 2021-02-15 NOTE — Progress Notes (Signed)
Anticoagulation Management Zoe Cobb is a 65 y.o. female who reports to the clinic for monitoring of warfarin treatment.   Indication: atrial fibrillation CHA2DS2 Vasc Score 3 (Age 55-74, Female, CHF hx), HAS-BLED 1 (Age >32)  Duration: indefinite Supervising physician: Yates Decamp  Anticoagulation Clinic Visit History:  Patient does not report signs/symptoms of bleeding or thromboembolism  Other recent changes: Pt maintaining 4-5 serving of high Vit K meals/weeks. Finished her course of Abx; UTI symptoms resolved.   Pt interested in switching to home INR monitoring due to transportation concerns. Sent in the referral.   Anticoagulation Episode Summary     Current INR goal:  2.0-3.0  TTR:  51.8 % (2.3 y)  Next INR check:  03/15/2021  INR from last check:  2.9 (02/15/2021)  Weekly max warfarin dose:    Target end date:    INR check location:    Preferred lab:    Send INR reminders to:     Indications   Atrial fibrillation (HCC) [I48.91] Monitoring for long-term anticoagulant use [Z51.81 Z79.01]        Comments:           Allergies  Allergen Reactions   Penicillins Swelling    Has patient had a PCN reaction causing immediate rash, facial/tongue/throat swelling, SOB or lightheadedness with hypotension: Yes Has patient had a PCN reaction causing severe rash involving mucus membranes or skin necrosis: No Has patient had a PCN reaction that required hospitalization No Has patient had a PCN reaction occurring within the last 10 years: No If all of the above answers are "NO", then may proceed with Cephalosporin use.     Current Outpatient Medications:    cefdinir (OMNICEF) 300 MG capsule, Take 1 capsule (300 mg total) by mouth 2 (two) times daily., Disp: 14 capsule, Rfl: 0   cholecalciferol (VITAMIN D) 1000 units tablet, Take 1,000 Units by mouth daily., Disp: , Rfl:    furosemide (LASIX) 40 MG tablet, Take 40 mg by mouth daily., Disp: , Rfl:    senna-docusate  (SENOKOT-S) 8.6-50 MG tablet, Take 1 tablet by mouth at bedtime as needed for mild constipation., Disp: 30 tablet, Rfl: 0   vitamin B-12 (CYANOCOBALAMIN) 1000 MCG tablet, Take 1,000 mcg by mouth daily., Disp: , Rfl:    warfarin (COUMADIN) 5 MG tablet, Take 1 tablet (5 mg total) by mouth daily. Refer to most recent anticoagulation note for most updated dosing instructions., Disp: 90 tablet, Rfl: 3 Past Medical History:  Diagnosis Date   CHF (congestive heart failure) (HCC)    Heart failure (HCC)    Hypertension    Morbid obesity (HCC)    Multiple sclerosis (HCC)    Paroxysmal atrial fibrillation (HCC)     ASSESSMENT  Recent Results: The most recent result is correlated with 27.5 mg per week:  Lab Results  Component Value Date   INR 2.9 02/15/2021   INR 3.4 (A) 01/25/2021   INR 1.7 (A) 12/30/2020    Anticoagulation Dosing: Description   INR at goal. Continue 2.5 mg every Tues, Thur, Sat and 5 mg all other days. Maintain high Vit K intake of 4-5 servings/week. Recheck INR in 4 week.      INR today: Therapeutic. Improves following recent held dose, and increased Vit K intake. Recent Abx possibly also contributing to elevated INR readings. Lower than planned Vit K intake also affecting supratherapeutic INR readings.  Pt denies any complains of bleeding or bruising. Denies any other changes in diet, medications, or lifestyle. Reviewed  the importance on maintaining consistent Vit K intake.    PLAN Weekly dose was unchanged by 0% to 27.5 mg/week. Continue taking 2.5 mg every Tues, Thurs, Sat and 5 mg all other days. Continue to maintain 4-5 servings of high Vit K/week. Recheck INR in 4 weeks.   Patient Instructions  INR at goal. Continue 2.5 mg every Tues, Thur, Sat and 5 mg all other days. Maintain high Vit K intake of 4-5 servings/week. Recheck INR in 4 week.  Patient advised to contact clinic or seek medical attention if signs/symptoms of bleeding or thromboembolism occur.  Patient  verbalized understanding by repeating back information and was advised to contact me if further medication-related questions arise.   Follow-up Return in about 4 weeks (around 03/15/2021).  Leonides Schanz, PharmD  15 minutes spent face-to-face with the patient during the encounter. 50% of time spent on education, including signs/sx bleeding and clotting, as well as food and drug interactions with warfarin. 50% of time was spent on fingerprick POC INR sample collection,processing, results determination, and documentation

## 2021-03-02 ENCOUNTER — Ambulatory Visit: Payer: Self-pay | Admitting: Pharmacist

## 2021-03-02 DIAGNOSIS — Z7901 Long term (current) use of anticoagulants: Secondary | ICD-10-CM

## 2021-03-02 DIAGNOSIS — I480 Paroxysmal atrial fibrillation: Secondary | ICD-10-CM

## 2021-03-02 DIAGNOSIS — Z5181 Encounter for therapeutic drug level monitoring: Secondary | ICD-10-CM

## 2021-03-02 LAB — POCT INR: INR: 3.8 — AB (ref 2.0–3.0)

## 2021-03-02 NOTE — Progress Notes (Signed)
Anticoagulation Management Zoe Cobb is a 65 y.o. female who reports to the clinic for monitoring of warfarin treatment.   Indication: atrial fibrillation CHA2DS2 Vasc Score 3 (Age 97-74, Female, CHF hx), HAS-BLED 1 (Age >58)  Duration: indefinite Supervising physician: Yates Decamp  Anticoagulation Clinic Visit History:  Patient does not report signs/symptoms of bleeding or thromboembolism  Other recent changes: Pt maintaining 4-5 serving of high Vit K meals/weeks.   Approved for Home INR monitoring. Pt to continue with weekly home INR monitoring.   Anticoagulation Episode Summary     Current INR goal:  2.0-3.0  TTR:  51.1 % (2.4 y)  Next INR check:  03/09/2021  INR from last check:  3.8 (03/02/2021)  Weekly max warfarin dose:    Target end date:    INR check location:    Preferred lab:    Send INR reminders to:     Indications   Atrial fibrillation (HCC) [I48.91] Monitoring for long-term anticoagulant use [Z51.81 Z79.01]        Comments:           Allergies  Allergen Reactions   Penicillins Swelling    Has patient had a PCN reaction causing immediate rash, facial/tongue/throat swelling, SOB or lightheadedness with hypotension: Yes Has patient had a PCN reaction causing severe rash involving mucus membranes or skin necrosis: No Has patient had a PCN reaction that required hospitalization No Has patient had a PCN reaction occurring within the last 10 years: No If all of the above answers are "NO", then may proceed with Cephalosporin use.     Current Outpatient Medications:    cholecalciferol (VITAMIN D) 1000 units tablet, Take 1,000 Units by mouth daily., Disp: , Rfl:    isosorbide mononitrate (IMDUR) 60 MG 24 hr tablet, Take 60 mg by mouth daily., Disp: , Rfl:    metoprolol succinate (TOPROL-XL) 50 MG 24 hr tablet, Take 50 mg by mouth daily., Disp: , Rfl:    senna-docusate (SENOKOT-S) 8.6-50 MG tablet, Take 1 tablet by mouth at bedtime as needed for mild  constipation., Disp: 30 tablet, Rfl: 0   torsemide (DEMADEX) 20 MG tablet, Take 1 tablet by mouth in the morning and at bedtime., Disp: , Rfl:    traMADol (ULTRAM) 50 MG tablet, Take 50 mg by mouth every 8 (eight) hours as needed., Disp: , Rfl:    vitamin B-12 (CYANOCOBALAMIN) 1000 MCG tablet, Take 1,000 mcg by mouth daily., Disp: , Rfl:    warfarin (COUMADIN) 5 MG tablet, Take 1 tablet (5 mg total) by mouth daily. Refer to most recent anticoagulation note for most updated dosing instructions., Disp: 90 tablet, Rfl: 3 Past Medical History:  Diagnosis Date   CHF (congestive heart failure) (HCC)    Heart failure (HCC)    Hypertension    Morbid obesity (HCC)    Multiple sclerosis (HCC)    Paroxysmal atrial fibrillation (HCC)     ASSESSMENT  Recent Results: The most recent result is correlated with 27.5 mg per week:  Lab Results  Component Value Date   INR 3.8 (A) 03/02/2021   INR 2.9 02/15/2021   INR 3.4 (A) 01/25/2021    Anticoagulation Dosing: Description   INR above goal. HOLD today, then decrease weekly dose to 5 mg every Sun, Mon, Wed and 2.5 mg all other days. Recheck INR in 1 week.      INR today: Supratherapeutic. INR trending up following returning to previous maintenance dose. Pt denies any recent Abx use, alcohol intake, diet changes  that might contribute to the supratheraputic INR reading. Denies any complains of bleeding or bruising symptoms. Denies any other changes in diet, medications, or lifestyle. In setting of persistently elevated INR readings, will hold today and then decrease weekly dose and continue close monitoring.    PLAN Weekly dose was decreased by 9.1% to 25 mg/week. HOLD warfarin today, then decrease weekly dose to 5 mg every Sun, Mon, Wed, and 2.5 mg all other days. Recheck INR in 1 week. Continue to maintain 4-5 servings of high Vit K/week.   Patient Instructions  INR above goal. HOLD today, then decrease weekly dose to 5 mg every Sun, Mon, Wed and  2.5 mg all other days. Recheck INR in 1 week.  Patient advised to contact clinic or seek medical attention if signs/symptoms of bleeding or thromboembolism occur.  Patient verbalized understanding by repeating back information and was advised to contact me if further medication-related questions arise.   Follow-up Return in about 1 week (around 03/09/2021).  Leonides Schanz, PharmD  15 minutes spent face-to-face with the patient during the encounter. 50% of time spent on education, including signs/sx bleeding and clotting, as well as food and drug interactions with warfarin. 50% of time was spent on fingerprick POC INR sample collection,processing, results determination, and documentation

## 2021-03-02 NOTE — Patient Instructions (Signed)
INR above goal. HOLD today, then decrease weekly dose to 5 mg every Sun, Mon, Wed and 2.5 mg all other days. Recheck INR in 1 week.

## 2021-03-16 ENCOUNTER — Ambulatory Visit: Payer: Self-pay | Admitting: Pharmacist

## 2021-03-16 DIAGNOSIS — Z7901 Long term (current) use of anticoagulants: Secondary | ICD-10-CM

## 2021-03-16 DIAGNOSIS — Z5181 Encounter for therapeutic drug level monitoring: Secondary | ICD-10-CM

## 2021-03-16 DIAGNOSIS — I480 Paroxysmal atrial fibrillation: Secondary | ICD-10-CM

## 2021-03-16 LAB — POCT INR: INR: 2.7 (ref 2.0–3.0)

## 2021-03-16 NOTE — Progress Notes (Signed)
Anticoagulation Management Zoe Cobb is a 65 y.o. female who reports to the clinic for monitoring of warfarin treatment.   Indication: atrial fibrillation CHA2DS2 Vasc Score 3 (Age 67-74, Female, CHF hx), HAS-BLED 1 (Age >10)  Duration: indefinite Supervising physician: Yates Decamp  Anticoagulation Clinic Visit History:  Patient does not report signs/symptoms of bleeding or thromboembolism  Other recent changes: Pt maintaining 4-5 serving of high Vit K meals/weeks.   Pt reports that INR last week of 2.8. Reading never transmitted over.   Approved for Home INR monitoring. Pt to continue with weekly home INR monitoring.   Anticoagulation Episode Summary     Current INR goal:  2.0-3.0  TTR:  50.7 % (2.4 y)  Next INR check:  03/23/2021  INR from last check:  2.7 (03/16/2021)  Weekly max warfarin dose:    Target end date:    INR check location:    Preferred lab:    Send INR reminders to:     Indications   Atrial fibrillation (HCC) [I48.91] Monitoring for long-term anticoagulant use [Z51.81 Z79.01]        Comments:           Allergies  Allergen Reactions   Penicillins Swelling    Has patient had a PCN reaction causing immediate rash, facial/tongue/throat swelling, SOB or lightheadedness with hypotension: Yes Has patient had a PCN reaction causing severe rash involving mucus membranes or skin necrosis: No Has patient had a PCN reaction that required hospitalization No Has patient had a PCN reaction occurring within the last 10 years: No If all of the above answers are "NO", then may proceed with Cephalosporin use.     Current Outpatient Medications:    cholecalciferol (VITAMIN D) 1000 units tablet, Take 1,000 Units by mouth daily., Disp: , Rfl:    isosorbide mononitrate (IMDUR) 60 MG 24 hr tablet, Take 60 mg by mouth daily., Disp: , Rfl:    metoprolol succinate (TOPROL-XL) 50 MG 24 hr tablet, Take 50 mg by mouth daily., Disp: , Rfl:    senna-docusate (SENOKOT-S)  8.6-50 MG tablet, Take 1 tablet by mouth at bedtime as needed for mild constipation., Disp: 30 tablet, Rfl: 0   torsemide (DEMADEX) 20 MG tablet, Take 1 tablet by mouth in the morning and at bedtime., Disp: , Rfl:    traMADol (ULTRAM) 50 MG tablet, Take 50 mg by mouth every 8 (eight) hours as needed., Disp: , Rfl:    vitamin B-12 (CYANOCOBALAMIN) 1000 MCG tablet, Take 1,000 mcg by mouth daily., Disp: , Rfl:    warfarin (COUMADIN) 5 MG tablet, Take 1 tablet (5 mg total) by mouth daily. Refer to most recent anticoagulation note for most updated dosing instructions., Disp: 90 tablet, Rfl: 3 Past Medical History:  Diagnosis Date   CHF (congestive heart failure) (HCC)    Heart failure (HCC)    Hypertension    Morbid obesity (HCC)    Multiple sclerosis (HCC)    Paroxysmal atrial fibrillation (HCC)     ASSESSMENT  Recent Results: The most recent result is correlated with 25 mg per week:  Lab Results  Component Value Date   INR 2.7 03/16/2021   INR 3.8 (A) 03/02/2021   INR 2.9 02/15/2021    Anticoagulation Dosing: Description   INR at goal. Adjust weekly dose to 5 mg every Mon, Wed, Fri and 2.5 mg all other days. Recheck INR in 1 week.   Testing: 1/4     INR today: Therapeutic. INR returns back within therapeutic range  following recent weekly dose decrease. Denies any complains of bleeding or bruising symptoms. Denies any other changes in diet, medications, or lifestyle. Will continue current weekly dose and continue remote monitoring.     PLAN Weekly dose was unchanged by 0% to 25 mg/week. Continue current weekly dose of 5 mg every Mon, Wed, Fri and 2.5 mg all other days. Recheck INR in 1 week. Continue to maintain 4-5 servings of high Vit K/week.   Patient Instructions  INR at goal. Adjust weekly dose to 5 mg every Mon, Wed, Fri and 2.5 mg all other days. Recheck INR in 1 week.  Patient advised to contact clinic or seek medical attention if signs/symptoms of bleeding or  thromboembolism occur.  Patient verbalized understanding by repeating back information and was advised to contact me if further medication-related questions arise.   Follow-up Return in about 1 week (around 03/23/2021).  Leonides Schanz, PharmD  15 minutes spent face-to-face with the patient during the encounter. 50% of time spent on education, including signs/sx bleeding and clotting, as well as food and drug interactions with warfarin. 50% of time was spent on fingerprick POC INR sample collection,processing, results determination, and documentation

## 2021-03-16 NOTE — Patient Instructions (Signed)
INR at goal. Adjust weekly dose to 5 mg every Mon, Wed, Fri and 2.5 mg all other days. Recheck INR in 1 week.

## 2021-03-23 ENCOUNTER — Ambulatory Visit: Payer: Self-pay | Admitting: Pharmacist

## 2021-03-23 ENCOUNTER — Ambulatory Visit: Payer: 59 | Admitting: Cardiology

## 2021-03-23 DIAGNOSIS — I480 Paroxysmal atrial fibrillation: Secondary | ICD-10-CM

## 2021-03-23 DIAGNOSIS — Z5181 Encounter for therapeutic drug level monitoring: Secondary | ICD-10-CM

## 2021-03-23 LAB — POCT INR: INR: 3.2 — AB (ref 2.0–3.0)

## 2021-03-23 NOTE — Patient Instructions (Signed)
INR above goal. Take 2.5 mg today and then continue taking 5 mg every Mon, Wed, Fri and 2.5 mg all other days. Recheck INR in 1 week.

## 2021-03-23 NOTE — Progress Notes (Signed)
Anticoagulation Management Zoe Cobb is a 65 y.o. female who reports to the clinic for monitoring of warfarin treatment.   Indication: atrial fibrillation CHA2DS2 Vasc Score 3 (Age 44-74, Female, CHF hx), HAS-BLED 1 (Age >44)  Duration: indefinite Supervising physician: Yates Decamp  Anticoagulation Clinic Visit History:  Patient does not report signs/symptoms of bleeding or thromboembolism  Other recent changes: Pt maintaining 4-5 serving of high Vit K meals/weeks.   Reports to having Vit K intake of broccoli, Kale, Asparagus, spinach, collard greens on a rotating bases.   Approved for Home INR monitoring. Pt to continue with weekly home INR monitoring.   Anticoagulation Episode Summary     Current INR goal:  2.0-3.0  TTR:  50.8 % (2.4 y)  Next INR check:  03/30/2021  INR from last check:  3.2 (03/23/2021)  Weekly max warfarin dose:    Target end date:    INR check location:    Preferred lab:    Send INR reminders to:     Indications   Atrial fibrillation (HCC) [I48.91] Monitoring for long-term anticoagulant use [Z51.81 Z79.01]        Comments:           Allergies  Allergen Reactions   Penicillins Swelling    Has patient had a PCN reaction causing immediate rash, facial/tongue/throat swelling, SOB or lightheadedness with hypotension: Yes Has patient had a PCN reaction causing severe rash involving mucus membranes or skin necrosis: No Has patient had a PCN reaction that required hospitalization No Has patient had a PCN reaction occurring within the last 10 years: No If all of the above answers are "NO", then may proceed with Cephalosporin use.     Current Outpatient Medications:    cholecalciferol (VITAMIN D) 1000 units tablet, Take 1,000 Units by mouth daily., Disp: , Rfl:    isosorbide mononitrate (IMDUR) 60 MG 24 hr tablet, Take 60 mg by mouth daily., Disp: , Rfl:    metoprolol succinate (TOPROL-XL) 50 MG 24 hr tablet, Take 50 mg by mouth daily., Disp: ,  Rfl:    senna-docusate (SENOKOT-S) 8.6-50 MG tablet, Take 1 tablet by mouth at bedtime as needed for mild constipation., Disp: 30 tablet, Rfl: 0   torsemide (DEMADEX) 20 MG tablet, Take 1 tablet by mouth in the morning and at bedtime., Disp: , Rfl:    traMADol (ULTRAM) 50 MG tablet, Take 50 mg by mouth every 8 (eight) hours as needed., Disp: , Rfl:    vitamin B-12 (CYANOCOBALAMIN) 1000 MCG tablet, Take 1,000 mcg by mouth daily., Disp: , Rfl:    warfarin (COUMADIN) 5 MG tablet, Take 1 tablet (5 mg total) by mouth daily. Refer to most recent anticoagulation note for most updated dosing instructions., Disp: 90 tablet, Rfl: 3 Past Medical History:  Diagnosis Date   CHF (congestive heart failure) (HCC)    Heart failure (HCC)    Hypertension    Morbid obesity (HCC)    Multiple sclerosis (HCC)    Paroxysmal atrial fibrillation (HCC)     ASSESSMENT  Recent Results: The most recent result is correlated with 25 mg per week:  Lab Results  Component Value Date   INR 3.2 (A) 03/23/2021   INR 2.7 03/16/2021   INR 3.8 (A) 03/02/2021    Anticoagulation Dosing: Description   INR above goal. Take 2.5 mg today and then continue taking 5 mg every Mon, Wed, Fri and 2.5 mg all other days. Recheck INR in 1 week.   Testing: 2/4  INR today: Supratherapeutic. INR slightly above goal. Pt denies any relevant changes in diet, medications, or lifestyle that would contribute to the elevated INR reading today. Pt reports staying compliant and consistent to her high Vit K intake of 4-5 servings/week. Denies any extra doses, recent Abx, EtOH, cranberry, or grapefruit juice intake. Denies any significant bleeding or bruising symptoms. Denies any s/sx of thromboembolic symptoms. Will dose adjust today and continue weekly monitoring. If INR remain above goal at next check, will consider weekly dose decrease at next INR call.     PLAN Weekly dose was unchanged by 0% to 25 mg/week. Take 2.5 mg today and then  continue current weekly dose of 5 mg every Mon, Wed, Fri and 2.5 mg all other days. Recheck INR in 1 week. Continue to maintain 4-5 servings of high Vit K/week.   Patient Instructions  INR above goal. Take 2.5 mg today and then continue taking 5 mg every Mon, Wed, Fri and 2.5 mg all other days. Recheck INR in 1 week.  Patient advised to contact clinic or seek medical attention if signs/symptoms of bleeding or thromboembolism occur.  Patient verbalized understanding by repeating back information and was advised to contact me if further medication-related questions arise.   Follow-up Return in about 1 week (around 03/30/2021).  Leonides Schanz, PharmD  15 minutes spent face-to-face with the patient during the encounter. 50% of time spent on education, including signs/sx bleeding and clotting, as well as food and drug interactions with warfarin. 50% of time was spent on fingerprick POC INR sample collection,processing, results determination, and documentation

## 2021-03-30 ENCOUNTER — Ambulatory Visit: Payer: Self-pay | Admitting: Pharmacist

## 2021-03-30 DIAGNOSIS — I480 Paroxysmal atrial fibrillation: Secondary | ICD-10-CM

## 2021-03-30 DIAGNOSIS — Z7901 Long term (current) use of anticoagulants: Secondary | ICD-10-CM

## 2021-03-30 DIAGNOSIS — Z5181 Encounter for therapeutic drug level monitoring: Secondary | ICD-10-CM

## 2021-03-30 LAB — POCT INR: INR: 4 — AB (ref 2.0–3.0)

## 2021-03-30 NOTE — Progress Notes (Signed)
Anticoagulation Management Zoe Cobb is a 65 y.o. female who reports to the clinic for monitoring of warfarin treatment.   Indication: atrial fibrillation CHA2DS2 Vasc Score 3 (Age 8-74, Female, CHF hx), HAS-BLED 1 (Age >80)  Duration: indefinite Supervising physician: Yates Decamp  Anticoagulation Clinic Visit History:  Patient does not report signs/symptoms of bleeding or thromboembolism  Other recent changes: Pt maintaining 4-5 serving of high Vit K meals/weeks.   Reports to having Vit K intake of broccoli, Kale, Asparagus, spinach, collard greens on a rotating bases.   Pt reports her sister recently passed away. Pt grieving. And hasnt been eating her normal Vit K intake. Pt agreeable to restarting back on her normal Vit K intake.   Approved for Home INR monitoring. Pt to continue with weekly home INR monitoring.   Anticoagulation Episode Summary     Current INR goal:  2.0-3.0  TTR:  50.4 % (2.4 y)  Next INR check:  04/06/2021  INR from last check:  4.0 (03/30/2021)  Weekly max warfarin dose:    Target end date:    INR check location:    Preferred lab:    Send INR reminders to:     Indications   Atrial fibrillation (HCC) [I48.91] Monitoring for long-term anticoagulant use [Z51.81 Z79.01]        Comments:           Allergies  Allergen Reactions   Penicillins Swelling    Has patient had a PCN reaction causing immediate rash, facial/tongue/throat swelling, SOB or lightheadedness with hypotension: Yes Has patient had a PCN reaction causing severe rash involving mucus membranes or skin necrosis: No Has patient had a PCN reaction that required hospitalization No Has patient had a PCN reaction occurring within the last 10 years: No If all of the above answers are "NO", then may proceed with Cephalosporin use.     Current Outpatient Medications:    cholecalciferol (VITAMIN D) 1000 units tablet, Take 1,000 Units by mouth daily., Disp: , Rfl:    isosorbide  mononitrate (IMDUR) 60 MG 24 hr tablet, Take 60 mg by mouth daily., Disp: , Rfl:    metoprolol succinate (TOPROL-XL) 50 MG 24 hr tablet, Take 50 mg by mouth daily., Disp: , Rfl:    senna-docusate (SENOKOT-S) 8.6-50 MG tablet, Take 1 tablet by mouth at bedtime as needed for mild constipation., Disp: 30 tablet, Rfl: 0   torsemide (DEMADEX) 20 MG tablet, Take 1 tablet by mouth in the morning and at bedtime., Disp: , Rfl:    traMADol (ULTRAM) 50 MG tablet, Take 50 mg by mouth every 8 (eight) hours as needed., Disp: , Rfl:    vitamin B-12 (CYANOCOBALAMIN) 1000 MCG tablet, Take 1,000 mcg by mouth daily., Disp: , Rfl:    warfarin (COUMADIN) 5 MG tablet, Take 1 tablet (5 mg total) by mouth daily. Refer to most recent anticoagulation note for most updated dosing instructions., Disp: 90 tablet, Rfl: 3 Past Medical History:  Diagnosis Date   CHF (congestive heart failure) (HCC)    Heart failure (HCC)    Hypertension    Morbid obesity (HCC)    Multiple sclerosis (HCC)    Paroxysmal atrial fibrillation (HCC)     ASSESSMENT  Recent Results: The most recent result is correlated with 22.5 mg per week:  Lab Results  Component Value Date   INR 4.0 (A) 03/30/2021   INR 3.2 (A) 03/23/2021   INR 2.7 03/16/2021    Anticoagulation Dosing: Description   INR above goal. HOLD  today and then decrease weekly dose to 5 mg every Mon, and 2.5 mg all other days. Recheck INR in 1 week.   Testing: 3/4     INR today: Supratherapeutic. INR continues to trend up despite recent weekly dose adjustment. No Vit K intake may contribute to the supratheraputic reading. Pt denies any complains of unusual bleeding or bruising symptoms. Denies any other recent changes in diet, medication, or lifestyles. Denies any s/sx of thromboembolic symptoms. Will continue dose decrease and continue close monitoring and follow-up.    PLAN Weekly dose was decreased by 20% to 20 mg/week. HOLD today and then decrease weekly dose to 5 mg  every Mon and 2.5 mg all other days. Recheck INR in 1 week. Continue to maintain 4-5 servings of high Vit K/week.   Patient Instructions  INR above goal. HOLD today and then decrease weekly dose to 5 mg every Mon, and 2.5 mg all other days. Recheck INR in 1 week.  Patient advised to contact clinic or seek medical attention if signs/symptoms of bleeding or thromboembolism occur.  Patient verbalized understanding by repeating back information and was advised to contact me if further medication-related questions arise.   Follow-up Return in about 1 week (around 04/06/2021).  Leonides Schanz, PharmD  15 minutes spent face-to-face with the patient during the encounter. 50% of time spent on education, including signs/sx bleeding and clotting, as well as food and drug interactions with warfarin. 50% of time was spent on fingerprick POC INR sample collection,processing, results determination, and documentation

## 2021-03-30 NOTE — Patient Instructions (Signed)
INR above goal. HOLD today and then decrease weekly dose to 5 mg every Mon, and 2.5 mg all other days. Recheck INR in 1 week.

## 2021-04-06 ENCOUNTER — Ambulatory Visit: Payer: Medicare Other | Admitting: Cardiology

## 2021-04-06 ENCOUNTER — Ambulatory Visit: Payer: Self-pay | Admitting: Pharmacist

## 2021-04-06 DIAGNOSIS — Z7901 Long term (current) use of anticoagulants: Secondary | ICD-10-CM

## 2021-04-06 DIAGNOSIS — Z5181 Encounter for therapeutic drug level monitoring: Secondary | ICD-10-CM

## 2021-04-06 DIAGNOSIS — I480 Paroxysmal atrial fibrillation: Secondary | ICD-10-CM

## 2021-04-06 LAB — POCT INR: INR: 2.5 (ref 2.0–3.0)

## 2021-04-06 NOTE — Patient Instructions (Signed)
INR at goal. Continue current weekly dose of 5 mg every Mon, and 2.5 mg all other days. Recheck INR in 1 week.

## 2021-04-06 NOTE — Progress Notes (Signed)
Anticoagulation Management Zoe Cobb is a 65 y.o. female who reports to the clinic for monitoring of warfarin treatment.   Indication: atrial fibrillation CHA2DS2 Vasc Score 3 (Age 60-74, Female, CHF hx), HAS-BLED 1 (Age >68)  Duration: indefinite Supervising physician: Yates Decamp  Anticoagulation Clinic Visit History:  Patient does not report signs/symptoms of bleeding or thromboembolism  Other recent changes: Pt maintaining 4-5 serving of high Vit K meals/weeks.   Reports to having Vit K intake of broccoli, Kale, Asparagus, spinach, collard greens on a rotating bases.   Pt reports her sister recently passed away. Pt grieving. Returned back to baseline Vit K intake.   Approved for Home INR monitoring. Pt to continue with weekly home INR monitoring.   Anticoagulation Episode Summary     Current INR goal:  2.0-3.0  TTR:  50.2 % (2.5 y)  Next INR check:  04/13/2021  INR from last check:  2.5 (04/06/2021)  Weekly max warfarin dose:    Target end date:    INR check location:    Preferred lab:    Send INR reminders to:     Indications   Atrial fibrillation (HCC) [I48.91] Monitoring for long-term anticoagulant use [Z51.81 Z79.01]        Comments:           Allergies  Allergen Reactions   Penicillins Swelling    Has patient had a PCN reaction causing immediate rash, facial/tongue/throat swelling, SOB or lightheadedness with hypotension: Yes Has patient had a PCN reaction causing severe rash involving mucus membranes or skin necrosis: No Has patient had a PCN reaction that required hospitalization No Has patient had a PCN reaction occurring within the last 10 years: No If all of the above answers are "NO", then may proceed with Cephalosporin use.     Current Outpatient Medications:    cholecalciferol (VITAMIN D) 1000 units tablet, Take 1,000 Units by mouth daily., Disp: , Rfl:    isosorbide mononitrate (IMDUR) 60 MG 24 hr tablet, Take 60 mg by mouth daily., Disp: ,  Rfl:    metoprolol succinate (TOPROL-XL) 50 MG 24 hr tablet, Take 50 mg by mouth daily., Disp: , Rfl:    senna-docusate (SENOKOT-S) 8.6-50 MG tablet, Take 1 tablet by mouth at bedtime as needed for mild constipation., Disp: 30 tablet, Rfl: 0   torsemide (DEMADEX) 20 MG tablet, Take 1 tablet by mouth in the morning and at bedtime., Disp: , Rfl:    traMADol (ULTRAM) 50 MG tablet, Take 50 mg by mouth every 8 (eight) hours as needed., Disp: , Rfl:    vitamin B-12 (CYANOCOBALAMIN) 1000 MCG tablet, Take 1,000 mcg by mouth daily., Disp: , Rfl:    warfarin (COUMADIN) 5 MG tablet, Take 1 tablet (5 mg total) by mouth daily. Refer to most recent anticoagulation note for most updated dosing instructions., Disp: 90 tablet, Rfl: 3 Past Medical History:  Diagnosis Date   CHF (congestive heart failure) (HCC)    Heart failure (HCC)    Hypertension    Morbid obesity (HCC)    Multiple sclerosis (HCC)    Paroxysmal atrial fibrillation (HCC)     ASSESSMENT  Recent Results: The most recent result is correlated with 22.5 mg per week:  Lab Results  Component Value Date   INR 2.5 04/06/2021   INR 4.0 (A) 03/30/2021   INR 3.2 (A) 03/23/2021    Anticoagulation Dosing: Description   INR at goal. Continue current weekly dose of 5 mg every Mon, and 2.5 mg all other  days. Recheck INR in 1 week.   Testing: 4/4     INR today: Therapeutic. INR improved following recent held dose and weekly dose decrease. INR drop of 1.5 units in 1 week. Pt based to normal routine and baseline Vit K intake. Pt denies any complains of unusual bleeding or bruising symptoms. Denies any other recent changes in diet, medication, or lifestyles. Denies any s/sx of thromboembolic symptoms. In setting of recent labile INR reading, will continue current weekly dose and continue close monitoring.     PLAN Weekly dose was unchanged by 0 % to 20 mg/week. Continue current weekly dose of 5 mg every Mon and 2.5 mg all other days. Recheck INR in  1 week. Continue to maintain 4-5 servings of high Vit K/week.   Patient Instructions  INR at goal. Continue current weekly dose of 5 mg every Mon, and 2.5 mg all other days. Recheck INR in 1 week.  Patient advised to contact clinic or seek medical attention if signs/symptoms of bleeding or thromboembolism occur.  Patient verbalized understanding by repeating back information and was advised to contact me if further medication-related questions arise.   Follow-up Return in about 1 week (around 04/13/2021).  Leonides Schanz, PharmD  15 minutes spent face-to-face with the patient during the encounter. 50% of time spent on education, including signs/sx bleeding and clotting, as well as food and drug interactions with warfarin. 50% of time was spent on fingerprick POC INR sample collection,processing, results determination, and documentation

## 2021-04-14 ENCOUNTER — Ambulatory Visit: Payer: Self-pay | Admitting: Pharmacist

## 2021-04-14 DIAGNOSIS — Z5181 Encounter for therapeutic drug level monitoring: Secondary | ICD-10-CM

## 2021-04-14 DIAGNOSIS — Z7901 Long term (current) use of anticoagulants: Secondary | ICD-10-CM

## 2021-04-14 DIAGNOSIS — I480 Paroxysmal atrial fibrillation: Secondary | ICD-10-CM

## 2021-04-14 LAB — POCT INR: INR: 2.7 (ref 2.0–3.0)

## 2021-04-14 NOTE — Progress Notes (Signed)
Anticoagulation Management Zoe Cobb is a 65 y.o. female who reports to the clinic for monitoring of warfarin treatment.   Indication: atrial fibrillation CHA2DS2 Vasc Score 3 (Age 20-74, Female, CHF hx), HAS-BLED 1 (Age >25)  Duration: indefinite Supervising physician: Yates Decamp  Anticoagulation Clinic Visit History:  Patient does not report signs/symptoms of bleeding or thromboembolism  Other recent changes: Pt maintaining 4-5 serving of high Vit K meals/weeks.   Reports to having Vit K intake of broccoli, Kale, Asparagus, spinach, collard greens on a rotating bases.   Approved for Home INR monitoring. Pt to continue with weekly home INR monitoring.   Anticoagulation Episode Summary     Current INR goal:  2.0-3.0  TTR:  50.7 % (2.5 y)  Next INR check:  04/21/2021  INR from last check:  2.7 (04/14/2021)  Weekly max warfarin dose:    Target end date:    INR check location:    Preferred lab:    Send INR reminders to:     Indications   Atrial fibrillation (HCC) [I48.91] Monitoring for long-term anticoagulant use [Z51.81 Z79.01]        Comments:           Allergies  Allergen Reactions   Penicillins Swelling    Has patient had a PCN reaction causing immediate rash, facial/tongue/throat swelling, SOB or lightheadedness with hypotension: Yes Has patient had a PCN reaction causing severe rash involving mucus membranes or skin necrosis: No Has patient had a PCN reaction that required hospitalization No Has patient had a PCN reaction occurring within the last 10 years: No If all of the above answers are "NO", then may proceed with Cephalosporin use.     Current Outpatient Medications:    cholecalciferol (VITAMIN D) 1000 units tablet, Take 1,000 Units by mouth daily., Disp: , Rfl:    isosorbide mononitrate (IMDUR) 60 MG 24 hr tablet, Take 60 mg by mouth daily., Disp: , Rfl:    metoprolol succinate (TOPROL-XL) 50 MG 24 hr tablet, Take 50 mg by mouth daily., Disp: ,  Rfl:    senna-docusate (SENOKOT-S) 8.6-50 MG tablet, Take 1 tablet by mouth at bedtime as needed for mild constipation., Disp: 30 tablet, Rfl: 0   torsemide (DEMADEX) 20 MG tablet, Take 1 tablet by mouth in the morning and at bedtime., Disp: , Rfl:    traMADol (ULTRAM) 50 MG tablet, Take 50 mg by mouth every 8 (eight) hours as needed., Disp: , Rfl:    vitamin B-12 (CYANOCOBALAMIN) 1000 MCG tablet, Take 1,000 mcg by mouth daily., Disp: , Rfl:    warfarin (COUMADIN) 5 MG tablet, Take 1 tablet (5 mg total) by mouth daily. Refer to most recent anticoagulation note for most updated dosing instructions. (Patient taking differently: Take 5 mg by mouth daily. Or as directed by the coumadin clinic  Currently on: 5 mg every Mon; 2.5 mg all other days), Disp: 90 tablet, Rfl: 3 Past Medical History:  Diagnosis Date   CHF (congestive heart failure) (HCC)    Heart failure (HCC)    Hypertension    Morbid obesity (HCC)    Multiple sclerosis (HCC)    Paroxysmal atrial fibrillation (HCC)     ASSESSMENT  Recent Results: The most recent result is correlated with 22.5 mg per week:  Lab Results  Component Value Date   INR 2.7 04/14/2021   INR 2.5 04/06/2021   INR 4.0 (A) 03/30/2021    Anticoagulation Dosing: Description   INR at goal. Continue current weekly dose of  5 mg every Mon, and 2.5 mg all other days. Recheck INR in 1 week.   Testing: 1/4     INR today: Therapeutic. INR continues to remain steady on current weekly dose. Pt based to normal routine and baseline Vit K intake. Pt denies any complains of unusual bleeding or bruising symptoms. Denies any other recent changes in diet, medication, or lifestyles. Denies any s/sx of thromboembolic symptoms. Will continue current weekly dose and remote monitoring.    PLAN Weekly dose was unchanged by 0 % to 20 mg/week. Continue current weekly dose of 5 mg every Mon and 2.5 mg all other days. Recheck INR in 1 week. Continue to maintain 4-5 servings of  high Vit K/week.   Patient Instructions  INR at goal. Continue current weekly dose of 5 mg every Mon, and 2.5 mg all other days. Recheck INR in 1 week.  Patient advised to contact clinic or seek medical attention if signs/symptoms of bleeding or thromboembolism occur.  Patient verbalized understanding by repeating back information and was advised to contact me if further medication-related questions arise.   Follow-up Return in about 1 week (around 04/21/2021).  Leonides Schanz, PharmD  15 minutes spent face-to-face with the patient during the encounter. 50% of time spent on education, including signs/sx bleeding and clotting, as well as food and drug interactions with warfarin. 50% of time was spent on fingerprick POC INR sample collection,processing, results determination, and documentation

## 2021-04-14 NOTE — Patient Instructions (Signed)
INR at goal. Continue current weekly dose of 5 mg every Mon, and 2.5 mg all other days. Recheck INR in 1 week.  

## 2021-04-20 ENCOUNTER — Ambulatory Visit: Payer: Self-pay | Admitting: Pharmacist

## 2021-04-20 DIAGNOSIS — I480 Paroxysmal atrial fibrillation: Secondary | ICD-10-CM

## 2021-04-20 DIAGNOSIS — Z5181 Encounter for therapeutic drug level monitoring: Secondary | ICD-10-CM

## 2021-04-20 LAB — POCT INR: INR: 2.3 (ref 2.0–3.0)

## 2021-04-20 NOTE — Progress Notes (Signed)
Anticoagulation Management Zoe Cobb is a 65 y.o. female who reports to the clinic for monitoring of warfarin treatment.   Indication: atrial fibrillation CHA2DS2 Vasc Score 3 (Age 17-74, Female, CHF hx), HAS-BLED 1 (Age >73)  Duration: indefinite Supervising physician: Yates Decamp  Anticoagulation Clinic Visit History:  Patient does not report signs/symptoms of bleeding or thromboembolism  Other recent changes: Pt maintaining 4-5 serving of high Vit K meals/weeks.   Reports to having Vit K intake of broccoli, Kale, Asparagus, spinach, collard greens on a rotating bases.   Approved for Home INR monitoring. Pt to continue with weekly home INR monitoring.   Anticoagulation Episode Summary     Current INR goal:  2.0-3.0  TTR:  51.0 % (2.5 y)  Next INR check:  04/27/2021  INR from last check:  2.3 (04/20/2021)  Weekly max warfarin dose:    Target end date:    INR check location:    Preferred lab:    Send INR reminders to:     Indications   Atrial fibrillation (HCC) [I48.91] Monitoring for long-term anticoagulant use [Z51.81 Z79.01]        Comments:           Allergies  Allergen Reactions   Penicillins Swelling    Has patient had a PCN reaction causing immediate rash, facial/tongue/throat swelling, SOB or lightheadedness with hypotension: Yes Has patient had a PCN reaction causing severe rash involving mucus membranes or skin necrosis: No Has patient had a PCN reaction that required hospitalization No Has patient had a PCN reaction occurring within the last 10 years: No If all of the above answers are "NO", then may proceed with Cephalosporin use.     Current Outpatient Medications:    cholecalciferol (VITAMIN D) 1000 units tablet, Take 1,000 Units by mouth daily., Disp: , Rfl:    isosorbide mononitrate (IMDUR) 60 MG 24 hr tablet, Take 60 mg by mouth daily., Disp: , Rfl:    metoprolol succinate (TOPROL-XL) 50 MG 24 hr tablet, Take 50 mg by mouth daily., Disp: ,  Rfl:    senna-docusate (SENOKOT-S) 8.6-50 MG tablet, Take 1 tablet by mouth at bedtime as needed for mild constipation., Disp: 30 tablet, Rfl: 0   torsemide (DEMADEX) 20 MG tablet, Take 1 tablet by mouth in the morning and at bedtime., Disp: , Rfl:    traMADol (ULTRAM) 50 MG tablet, Take 50 mg by mouth every 8 (eight) hours as needed., Disp: , Rfl:    vitamin B-12 (CYANOCOBALAMIN) 1000 MCG tablet, Take 1,000 mcg by mouth daily., Disp: , Rfl:    warfarin (COUMADIN) 5 MG tablet, Take 1 tablet (5 mg total) by mouth daily. Refer to most recent anticoagulation note for most updated dosing instructions. (Patient taking differently: Take 5 mg by mouth daily. Or as directed by the coumadin clinic  Currently on: 5 mg every Mon; 2.5 mg all other days), Disp: 90 tablet, Rfl: 3 Past Medical History:  Diagnosis Date   CHF (congestive heart failure) (HCC)    Heart failure (HCC)    Hypertension    Morbid obesity (HCC)    Multiple sclerosis (HCC)    Paroxysmal atrial fibrillation (HCC)     ASSESSMENT  Recent Results: The most recent result is correlated with 22.5 mg per week:  Lab Results  Component Value Date   INR 2.3 04/20/2021   INR 2.7 04/14/2021   INR 2.5 04/06/2021    Anticoagulation Dosing: Description   INR at goal. Continue current weekly dose of 5  mg every Mon, and 2.5 mg all other days. Recheck INR in 1 week.   Testing: 2/4     INR today: Therapeutic. INR continues to remain steady on current weekly dose. Pt based to normal routine and baseline Vit K intake. Pt denies any complains of unusual bleeding or bruising symptoms. Denies any other recent changes in diet, medication, or lifestyles. Denies any s/sx of thromboembolic symptoms. Will continue current weekly dose and remote monitoring.    PLAN Weekly dose was unchanged by 0 % to 20 mg/week. Continue current weekly dose of 5 mg every Mon and 2.5 mg all other days. Recheck INR in 1 week. Continue to maintain 4-5 servings of high  Vit K/week.   Patient Instructions  INR at goal. Continue current weekly dose of 5 mg every Mon, and 2.5 mg all other days. Recheck INR in 1 week.  Patient advised to contact clinic or seek medical attention if signs/symptoms of bleeding or thromboembolism occur.  Patient verbalized understanding by repeating back information and was advised to contact me if further medication-related questions arise.   Follow-up Return in about 1 week (around 04/27/2021).  Leonides Schanz, PharmD  15 minutes spent face-to-face with the patient during the encounter. 50% of time spent on education, including signs/sx bleeding and clotting, as well as food and drug interactions with warfarin. 50% of time was spent on fingerprick POC INR sample collection,processing, results determination, and documentation

## 2021-04-20 NOTE — Patient Instructions (Signed)
INR at goal. Continue current weekly dose of 5 mg every Mon, and 2.5 mg all other days. Recheck INR in 1 week.  

## 2021-04-27 ENCOUNTER — Ambulatory Visit: Payer: Self-pay | Admitting: Pharmacist

## 2021-04-27 DIAGNOSIS — I480 Paroxysmal atrial fibrillation: Secondary | ICD-10-CM

## 2021-04-27 DIAGNOSIS — Z5181 Encounter for therapeutic drug level monitoring: Secondary | ICD-10-CM

## 2021-04-27 LAB — POCT INR: INR: 2.6 (ref 2.0–3.0)

## 2021-04-27 NOTE — Progress Notes (Signed)
Anticoagulation Management Zoe Cobb is a 65 y.o. female who reports to the clinic for monitoring of warfarin treatment.   Indication: atrial fibrillation CHA2DS2 Vasc Score 3 (Age 33-74, Female, CHF hx), HAS-BLED 1 (Age >92)  Duration: indefinite Supervising physician: Yates Decamp  Anticoagulation Clinic Visit History:  Patient does not report signs/symptoms of bleeding or thromboembolism  Other recent changes: Pt maintaining 4-5 serving of high Vit K meals/weeks.   Reports to having Vit K intake of broccoli, Kale, Asparagus, spinach, collard greens on a rotating bases.   Approved for Home INR monitoring. Pt to continue with weekly home INR monitoring.   Anticoagulation Episode Summary     Current INR goal:  2.0-3.0  TTR:  51.4 % (2.5 y)  Next INR check:  05/04/2021  INR from last check:  2.6 (04/27/2021)  Weekly max warfarin dose:    Target end date:    INR check location:    Preferred lab:    Send INR reminders to:     Indications   Atrial fibrillation (HCC) [I48.91] Monitoring for long-term anticoagulant use [Z51.81 Z79.01]        Comments:           Allergies  Allergen Reactions   Penicillins Swelling    Has patient had a PCN reaction causing immediate rash, facial/tongue/throat swelling, SOB or lightheadedness with hypotension: Yes Has patient had a PCN reaction causing severe rash involving mucus membranes or skin necrosis: No Has patient had a PCN reaction that required hospitalization No Has patient had a PCN reaction occurring within the last 10 years: No If all of the above answers are "NO", then may proceed with Cephalosporin use.     Current Outpatient Medications:    cholecalciferol (VITAMIN D) 1000 units tablet, Take 1,000 Units by mouth daily., Disp: , Rfl:    isosorbide mononitrate (IMDUR) 60 MG 24 hr tablet, Take 60 mg by mouth daily., Disp: , Rfl:    metoprolol succinate (TOPROL-XL) 50 MG 24 hr tablet, Take 50 mg by mouth daily., Disp: ,  Rfl:    senna-docusate (SENOKOT-S) 8.6-50 MG tablet, Take 1 tablet by mouth at bedtime as needed for mild constipation., Disp: 30 tablet, Rfl: 0   torsemide (DEMADEX) 20 MG tablet, Take 1 tablet by mouth in the morning and at bedtime., Disp: , Rfl:    traMADol (ULTRAM) 50 MG tablet, Take 50 mg by mouth every 8 (eight) hours as needed., Disp: , Rfl:    vitamin B-12 (CYANOCOBALAMIN) 1000 MCG tablet, Take 1,000 mcg by mouth daily., Disp: , Rfl:    warfarin (COUMADIN) 5 MG tablet, Take 1 tablet (5 mg total) by mouth daily. Refer to most recent anticoagulation note for most updated dosing instructions. (Patient taking differently: Take 5 mg by mouth daily. Or as directed by the coumadin clinic  Currently on: 5 mg every Mon; 2.5 mg all other days), Disp: 90 tablet, Rfl: 3 Past Medical History:  Diagnosis Date   CHF (congestive heart failure) (HCC)    Heart failure (HCC)    Hypertension    Morbid obesity (HCC)    Multiple sclerosis (HCC)    Paroxysmal atrial fibrillation (HCC)     ASSESSMENT  Recent Results: The most recent result is correlated with 20 mg per week:  Lab Results  Component Value Date   INR 2.6 04/27/2021   INR 2.3 04/20/2021   INR 2.7 04/14/2021    Anticoagulation Dosing: Description   INR at goal. Continue current weekly dose of 5  mg every Mon, and 2.5 mg all other days. Recheck INR in 1 week.       INR today: Therapeutic. INR continues to remain steady on current weekly dose. Pt maintaining baseline Vit K intake of 4-5 serving/week. Pt denies any complains of unusual bleeding or bruising symptoms. Denies any other recent changes in diet, medication, or lifestyles. Denies any s/sx of thromboembolic symptoms. Will continue current weekly dose and remote monitoring.    PLAN Weekly dose was unchanged by 0 % to 20 mg/week. Continue current weekly dose of 5 mg every Mon and 2.5 mg all other days. Recheck INR in 1 week. Continue to maintain 4-5 servings of high Vit K/week.    Patient Instructions  INR at goal. Continue current weekly dose of 5 mg every Mon, and 2.5 mg all other days. Recheck INR in 1 week.  Patient advised to contact clinic or seek medical attention if signs/symptoms of bleeding or thromboembolism occur.  Patient verbalized understanding by repeating back information and was advised to contact me if further medication-related questions arise.   Follow-up Return in about 1 week (around 05/04/2021).  Leonides Schanz, PharmD  15 minutes spent face-to-face with the patient during the encounter. 50% of time spent on education, including signs/sx bleeding and clotting, as well as food and drug interactions with warfarin. 50% of time was spent on fingerprick POC INR sample collection,processing, results determination, and documentation

## 2021-04-27 NOTE — Patient Instructions (Signed)
INR at goal. Continue current weekly dose of 5 mg every Mon, and 2.5 mg all other days. Recheck INR in 1 week.  

## 2021-05-04 ENCOUNTER — Ambulatory Visit: Payer: Self-pay | Admitting: Pharmacist

## 2021-05-04 DIAGNOSIS — I480 Paroxysmal atrial fibrillation: Secondary | ICD-10-CM

## 2021-05-04 DIAGNOSIS — Z7901 Long term (current) use of anticoagulants: Secondary | ICD-10-CM

## 2021-05-04 DIAGNOSIS — Z5181 Encounter for therapeutic drug level monitoring: Secondary | ICD-10-CM

## 2021-05-04 LAB — POCT INR: INR: 3.8 — AB (ref 2.0–3.0)

## 2021-05-04 NOTE — Patient Instructions (Signed)
INR above goal. HOLD today, then continue current weekly dose of 5 mg every Mon, and 2.5 mg all other days. Recheck INR in 1 week.

## 2021-05-04 NOTE — Progress Notes (Signed)
Anticoagulation Management Zoe Cobb is a 65 y.o. female who reports to the clinic for monitoring of warfarin treatment.   Indication: atrial fibrillation CHA2DS2 Vasc Score 3 (Age 54-74, Female, CHF hx), HAS-BLED 1 (Age >9)  Duration: indefinite Supervising physician: Yates Decamp  Anticoagulation Clinic Visit History:  Patient does not report signs/symptoms of bleeding or thromboembolism  Other recent changes: Pt maintaining 4-5 serving of high Vit K meals/weeks.   Reports to having Vit K intake of broccoli, Kale, Asparagus, spinach, collard greens on a rotating bases.   Pt reports that she hasnt had her regular Vit K intake as her previous baseline. Pt is planing on returning to previous baseline Vit K intake.  Approved for Home INR monitoring. Pt to continue with weekly home INR monitoring.   Anticoagulation Episode Summary     Current INR goal:  2.0-3.0  TTR:  51.3 % (2.5 y)  Next INR check:  05/11/2021  INR from last check:  3.8 (05/04/2021)  Weekly max warfarin dose:    Target end date:    INR check location:    Preferred lab:    Send INR reminders to:     Indications   Atrial fibrillation (HCC) [I48.91] Monitoring for long-term anticoagulant use [Z51.81 Z79.01]        Comments:           Allergies  Allergen Reactions   Penicillins Swelling    Has patient had a PCN reaction causing immediate rash, facial/tongue/throat swelling, SOB or lightheadedness with hypotension: Yes Has patient had a PCN reaction causing severe rash involving mucus membranes or skin necrosis: No Has patient had a PCN reaction that required hospitalization No Has patient had a PCN reaction occurring within the last 10 years: No If all of the above answers are "NO", then may proceed with Cephalosporin use.     Current Outpatient Medications:    cholecalciferol (VITAMIN D) 1000 units tablet, Take 1,000 Units by mouth daily., Disp: , Rfl:    isosorbide mononitrate (IMDUR) 60 MG 24  hr tablet, Take 60 mg by mouth daily., Disp: , Rfl:    metoprolol succinate (TOPROL-XL) 50 MG 24 hr tablet, Take 50 mg by mouth daily., Disp: , Rfl:    senna-docusate (SENOKOT-S) 8.6-50 MG tablet, Take 1 tablet by mouth at bedtime as needed for mild constipation., Disp: 30 tablet, Rfl: 0   torsemide (DEMADEX) 20 MG tablet, Take 1 tablet by mouth in the morning and at bedtime., Disp: , Rfl:    traMADol (ULTRAM) 50 MG tablet, Take 50 mg by mouth every 8 (eight) hours as needed., Disp: , Rfl:    vitamin B-12 (CYANOCOBALAMIN) 1000 MCG tablet, Take 1,000 mcg by mouth daily., Disp: , Rfl:    warfarin (COUMADIN) 5 MG tablet, Take 1 tablet (5 mg total) by mouth daily. Refer to most recent anticoagulation note for most updated dosing instructions. (Patient taking differently: Take 5 mg by mouth daily. Or as directed by the coumadin clinic  Currently on: 5 mg every Mon; 2.5 mg all other days), Disp: 90 tablet, Rfl: 3 Past Medical History:  Diagnosis Date   CHF (congestive heart failure) (HCC)    Heart failure (HCC)    Hypertension    Morbid obesity (HCC)    Multiple sclerosis (HCC)    Paroxysmal atrial fibrillation (HCC)     ASSESSMENT  Recent Results: The most recent result is correlated with 20 mg per week:  Lab Results  Component Value Date   INR 3.8 (A)  05/04/2021   INR 2.6 04/27/2021   INR 2.3 04/20/2021    Anticoagulation Dosing: Description   INR above goal. HOLD today, then continue current weekly dose of 5 mg every Mon, and 2.5 mg all other days. Recheck INR in 1 week.      INR today: Supratherapeutic. In the setting of decreased Vit K intake than previous baseline. INR was previously stable and therapeutic on current weekly dose. Pt to returns to previous baseline Vit K intake of 4-5 serving/week. Pt denies any complains of unusual bleeding or bruising symptoms. Denies any other recent changes in diet, medication, or lifestyles. Denies any s/sx of thromboembolic symptoms. Will have  pt hold today and then continue previously stable weekly dose.    PLAN Weekly dose was unchanged by 0 % to 20 mg/week. HOLD today and then continue current weekly dose of 5 mg every Mon and 2.5 mg all other days. Recheck INR in 1 week. Continue to maintain 4-5 servings of high Vit K/week.   Patient Instructions  INR above goal. HOLD today, then continue current weekly dose of 5 mg every Mon, and 2.5 mg all other days. Recheck INR in 1 week.  Patient advised to contact clinic or seek medical attention if signs/symptoms of bleeding or thromboembolism occur.  Patient verbalized understanding by repeating back information and was advised to contact me if further medication-related questions arise.   Follow-up Return in about 1 week (around 05/11/2021).  Leonides Schanz, PharmD  15 minutes spent face-to-face with the patient during the encounter. 50% of time spent on education, including signs/sx bleeding and clotting, as well as food and drug interactions with warfarin. 50% of time was spent on fingerprick POC INR sample collection,processing, results determination, and documentation

## 2021-05-11 ENCOUNTER — Ambulatory Visit: Payer: Self-pay | Admitting: Pharmacist

## 2021-05-11 DIAGNOSIS — Z5181 Encounter for therapeutic drug level monitoring: Secondary | ICD-10-CM

## 2021-05-11 DIAGNOSIS — I480 Paroxysmal atrial fibrillation: Secondary | ICD-10-CM

## 2021-05-11 LAB — POCT INR: INR: 2.5 (ref 2.0–3.0)

## 2021-05-11 NOTE — Patient Instructions (Signed)
INR at goal. Continue current weekly dose of 5 mg every Mon, and 2.5 mg all other days. Recheck INR in 1 week.  

## 2021-05-11 NOTE — Progress Notes (Signed)
Anticoagulation Management Zoe Cobb is a 65 y.o. female who reports to the clinic for monitoring of warfarin treatment.   Indication: atrial fibrillation CHA2DS2 Vasc Score 3 (Age 49-74, Female, CHF hx), HAS-BLED 1 (Age >40)  Duration: indefinite Supervising physician: Yates Decamp  Anticoagulation Clinic Visit History:  Patient does not report signs/symptoms of bleeding or thromboembolism  Other recent changes: Pt maintaining 4-5 serving of high Vit K meals/weeks.   Reports to having Vit K intake of broccoli, Kale, Asparagus, spinach, collard greens on a rotating bases.   Pt reports that she hasnt had her regular Vit K intake as her previous baseline. Pt is planing on returning to previous baseline Vit K intake.  Approved for Home INR monitoring. Pt to continue with weekly home INR monitoring.   Anticoagulation Episode Summary     Current INR goal:  2.0-3.0  TTR:  51.2 % (2.6 y)  Next INR check:  05/18/2021  INR from last check:  2.5 (05/11/2021)  Weekly max warfarin dose:    Target end date:    INR check location:    Preferred lab:    Send INR reminders to:     Indications   Atrial fibrillation (HCC) [I48.91] Monitoring for long-term anticoagulant use [Z51.81 Z79.01]        Comments:           Allergies  Allergen Reactions   Penicillins Swelling    Has patient had a PCN reaction causing immediate rash, facial/tongue/throat swelling, SOB or lightheadedness with hypotension: Yes Has patient had a PCN reaction causing severe rash involving mucus membranes or skin necrosis: No Has patient had a PCN reaction that required hospitalization No Has patient had a PCN reaction occurring within the last 10 years: No If all of the above answers are "NO", then may proceed with Cephalosporin use.     Current Outpatient Medications:    cholecalciferol (VITAMIN D) 1000 units tablet, Take 1,000 Units by mouth daily., Disp: , Rfl:    isosorbide mononitrate (IMDUR) 60 MG 24  hr tablet, Take 60 mg by mouth daily., Disp: , Rfl:    metoprolol succinate (TOPROL-XL) 50 MG 24 hr tablet, Take 50 mg by mouth daily., Disp: , Rfl:    senna-docusate (SENOKOT-S) 8.6-50 MG tablet, Take 1 tablet by mouth at bedtime as needed for mild constipation., Disp: 30 tablet, Rfl: 0   torsemide (DEMADEX) 20 MG tablet, Take 1 tablet by mouth in the morning and at bedtime., Disp: , Rfl:    traMADol (ULTRAM) 50 MG tablet, Take 50 mg by mouth every 8 (eight) hours as needed., Disp: , Rfl:    vitamin B-12 (CYANOCOBALAMIN) 1000 MCG tablet, Take 1,000 mcg by mouth daily., Disp: , Rfl:    warfarin (COUMADIN) 5 MG tablet, Take 1 tablet (5 mg total) by mouth daily. Refer to most recent anticoagulation note for most updated dosing instructions. (Patient taking differently: Take 5 mg by mouth daily. Or as directed by the coumadin clinic  Currently on: 5 mg every Mon; 2.5 mg all other days), Disp: 90 tablet, Rfl: 3 Past Medical History:  Diagnosis Date   CHF (congestive heart failure) (HCC)    Heart failure (HCC)    Hypertension    Morbid obesity (HCC)    Multiple sclerosis (HCC)    Paroxysmal atrial fibrillation (HCC)     ASSESSMENT  Recent Results: The most recent result is correlated with 20 mg per week:  Lab Results  Component Value Date   INR 2.5 05/11/2021  INR 3.8 (A) 05/04/2021   INR 2.6 04/27/2021    Anticoagulation Dosing: Description   INR at goal. Continue current weekly dose of 5 mg every Mon, and 2.5 mg all other days. Recheck INR in 1 week.      INR today: Therapeutic. In the setting of returning to previous baseline Vit K intake. Pt to maintain baseline Vit K intake of 4-5 serving/week. Pt denies any complains of unusual bleeding or bruising symptoms. Denies any other recent changes in diet, medication, or lifestyles. Denies any s/sx of thromboembolic symptoms. Will continue current stable weekly dose and continue monitoring.    PLAN Weekly dose was unchanged by 0 % to  20 mg/week. Continue current weekly dose of 5 mg every Mon and 2.5 mg all other days. Recheck INR in 1 week. Continue to maintain 4-5 servings of high Vit K/week.   Patient Instructions  INR at goal. Continue current weekly dose of 5 mg every Mon, and 2.5 mg all other days. Recheck INR in 1 week.  Patient advised to contact clinic or seek medical attention if signs/symptoms of bleeding or thromboembolism occur.  Patient verbalized understanding by repeating back information and was advised to contact me if further medication-related questions arise.   Follow-up Return in about 1 week (around 05/18/2021).  Leonides Schanz, PharmD  15 minutes spent face-to-face with the patient during the encounter. 50% of time spent on education, including signs/sx bleeding and clotting, as well as food and drug interactions with warfarin. 50% of time was spent on fingerprick POC INR sample collection,processing, results determination, and documentation

## 2021-05-19 ENCOUNTER — Ambulatory Visit: Payer: Self-pay | Admitting: Pharmacist

## 2021-05-19 DIAGNOSIS — Z5181 Encounter for therapeutic drug level monitoring: Secondary | ICD-10-CM

## 2021-05-19 DIAGNOSIS — I480 Paroxysmal atrial fibrillation: Secondary | ICD-10-CM

## 2021-05-19 DIAGNOSIS — Z7901 Long term (current) use of anticoagulants: Secondary | ICD-10-CM

## 2021-05-19 LAB — POCT INR: INR: 2.5 (ref 2.0–3.0)

## 2021-05-19 NOTE — Progress Notes (Signed)
Anticoagulation Management Zoe Cobb is a 65 y.o. female who reports to the clinic for monitoring of warfarin treatment.   Indication: atrial fibrillation CHA2DS2 Vasc Score 3 (Age 29-74, Female, CHF hx), HAS-BLED 1 (Age >80)  Duration: indefinite Supervising physician: Yates Decamp  Anticoagulation Clinic Visit History:  Patient does not report signs/symptoms of bleeding or thromboembolism  Other recent changes: Pt maintaining 4-5 serving of high Vit K meals/weeks.   Reports to having Vit K intake of broccoli, Kale, Asparagus, spinach, collard greens on a rotating bases.   Pt reports that she hasnt had her regular Vit K intake as her previous baseline. Pt is planing on returning to previous baseline Vit K intake.  Approved for Home INR monitoring. Pt to continue with weekly home INR monitoring.   Anticoagulation Episode Summary     Current INR goal:  2.0-3.0  TTR:  51.6 % (2.6 y)  Next INR check:  05/26/2021  INR from last check:  2.5 (05/19/2021)  Weekly max warfarin dose:    Target end date:    INR check location:    Preferred lab:    Send INR reminders to:     Indications   Atrial fibrillation (HCC) [I48.91] Monitoring for long-term anticoagulant use [Z51.81 Z79.01]        Comments:           Allergies  Allergen Reactions   Penicillins Swelling    Has patient had a PCN reaction causing immediate rash, facial/tongue/throat swelling, SOB or lightheadedness with hypotension: Yes Has patient had a PCN reaction causing severe rash involving mucus membranes or skin necrosis: No Has patient had a PCN reaction that required hospitalization No Has patient had a PCN reaction occurring within the last 10 years: No If all of the above answers are "NO", then may proceed with Cephalosporin use.     Current Outpatient Medications:    cholecalciferol (VITAMIN D) 1000 units tablet, Take 1,000 Units by mouth daily., Disp: , Rfl:    isosorbide mononitrate (IMDUR) 60 MG  24 hr tablet, Take 60 mg by mouth daily., Disp: , Rfl:    metoprolol succinate (TOPROL-XL) 50 MG 24 hr tablet, Take 50 mg by mouth daily., Disp: , Rfl:    senna-docusate (SENOKOT-S) 8.6-50 MG tablet, Take 1 tablet by mouth at bedtime as needed for mild constipation., Disp: 30 tablet, Rfl: 0   torsemide (DEMADEX) 20 MG tablet, Take 1 tablet by mouth in the morning and at bedtime., Disp: , Rfl:    traMADol (ULTRAM) 50 MG tablet, Take 50 mg by mouth every 8 (eight) hours as needed., Disp: , Rfl:    vitamin B-12 (CYANOCOBALAMIN) 1000 MCG tablet, Take 1,000 mcg by mouth daily., Disp: , Rfl:    warfarin (COUMADIN) 5 MG tablet, Take 1 tablet (5 mg total) by mouth daily. Refer to most recent anticoagulation note for most updated dosing instructions. (Patient taking differently: Take 5 mg by mouth daily. Or as directed by the coumadin clinic  Currently on: 5 mg every Mon; 2.5 mg all other days), Disp: 90 tablet, Rfl: 3 Past Medical History:  Diagnosis Date   CHF (congestive heart failure) (HCC)    Heart failure (HCC)    Hypertension    Morbid obesity (HCC)    Multiple sclerosis (HCC)    Paroxysmal atrial fibrillation (HCC)     ASSESSMENT  Recent Results: The most recent result is correlated with 20 mg per week:  Lab Results  Component Value Date   INR 2.5 05/19/2021  INR 2.5 05/11/2021   INR 3.8 (A) 05/04/2021    Anticoagulation Dosing: Description   INR at goal. Continue current weekly dose of 5 mg every Mon, and 2.5 mg all other days. Recheck INR in 1 week.      INR today: Therapeutic. In the setting of returning to previous baseline Vit K intake. Pt to maintain baseline Vit K intake of 4-5 serving/week. Pt denies any complains of unusual bleeding or bruising symptoms. Denies any other recent changes in diet, medication, or lifestyles. Denies any s/sx of thromboembolic symptoms. Will continue current stable weekly dose and continue monitoring.    PLAN Weekly dose was unchanged by 0 %  to 20 mg/week. Continue current weekly dose of 5 mg every Mon and 2.5 mg all other days. Recheck INR in 1 week. Continue to maintain 4-5 servings of high Vit K/week.   Patient Instructions  INR at goal. Continue current weekly dose of 5 mg every Mon, and 2.5 mg all other days. Recheck INR in 1 week.  Patient advised to contact clinic or seek medical attention if signs/symptoms of bleeding or thromboembolism occur.  Patient verbalized understanding by repeating back information and was advised to contact me if further medication-related questions arise.   Follow-up Return in about 1 week (around 05/26/2021).  Leonides Schanz, PharmD  15 minutes spent face-to-face with the patient during the encounter. 50% of time spent on education, including signs/sx bleeding and clotting, as well as food and drug interactions with warfarin. 50% of time was spent on fingerprick POC INR sample collection,processing, results determination, and documentation

## 2021-05-19 NOTE — Patient Instructions (Signed)
INR at goal. Continue current weekly dose of 5 mg every Mon, and 2.5 mg all other days. Recheck INR in 1 week.  

## 2021-05-25 ENCOUNTER — Ambulatory Visit: Payer: Self-pay | Admitting: Pharmacist

## 2021-05-25 DIAGNOSIS — I480 Paroxysmal atrial fibrillation: Secondary | ICD-10-CM

## 2021-05-25 DIAGNOSIS — Z5181 Encounter for therapeutic drug level monitoring: Secondary | ICD-10-CM

## 2021-05-25 DIAGNOSIS — Z7901 Long term (current) use of anticoagulants: Secondary | ICD-10-CM

## 2021-05-25 LAB — POCT INR: INR: 2 (ref 2.0–3.0)

## 2021-05-25 NOTE — Patient Instructions (Signed)
INR at goal. Increase weekly dose of 5 mg every Mon, Wed and 2.5 mg all other days. Recheck INR in 1 week.

## 2021-05-25 NOTE — Progress Notes (Signed)
Anticoagulation Management Zoe Cobb is a 65 y.o. female who reports to the clinic for monitoring of warfarin treatment.   Indication: atrial fibrillation CHA2DS2 Vasc Score 3 (Age 27-74, Female, CHF hx), HAS-BLED 1 (Age >2)  Duration: indefinite Supervising physician: Yates Decamp  Anticoagulation Clinic Visit History:  Patient does not report signs/symptoms of bleeding or thromboembolism  Other recent changes: Pt maintaining 4-5 serving of high Vit K meals/weeks.   Reports to having Vit K intake of broccoli, Kale, Asparagus, spinach, collard greens on a rotating bases.   Remains consistent with her Vit K intake.   Anticoagulation Episode Summary     Current INR goal:  2.0-3.0  TTR:  51.9 % (2.6 y)  Next INR check:  06/01/2021  INR from last check:  2.0 (05/25/2021)  Weekly max warfarin dose:    Target end date:    INR check location:    Preferred lab:    Send INR reminders to:     Indications   Atrial fibrillation (HCC) [I48.91] Monitoring for long-term anticoagulant use [Z51.81 Z79.01]        Comments:           Allergies  Allergen Reactions   Penicillins Swelling    Has patient had a PCN reaction causing immediate rash, facial/tongue/throat swelling, SOB or lightheadedness with hypotension: Yes Has patient had a PCN reaction causing severe rash involving mucus membranes or skin necrosis: No Has patient had a PCN reaction that required hospitalization No Has patient had a PCN reaction occurring within the last 10 years: No If all of the above answers are "NO", then may proceed with Cephalosporin use.     Current Outpatient Medications:    cholecalciferol (VITAMIN D) 1000 units tablet, Take 1,000 Units by mouth daily., Disp: , Rfl:    isosorbide mononitrate (IMDUR) 60 MG 24 hr tablet, Take 60 mg by mouth daily., Disp: , Rfl:    metoprolol succinate (TOPROL-XL) 50 MG 24 hr tablet, Take 50 mg by mouth daily., Disp: , Rfl:    senna-docusate (SENOKOT-S)  8.6-50 MG tablet, Take 1 tablet by mouth at bedtime as needed for mild constipation., Disp: 30 tablet, Rfl: 0   torsemide (DEMADEX) 20 MG tablet, Take 1 tablet by mouth in the morning and at bedtime., Disp: , Rfl:    traMADol (ULTRAM) 50 MG tablet, Take 50 mg by mouth every 8 (eight) hours as needed., Disp: , Rfl:    vitamin B-12 (CYANOCOBALAMIN) 1000 MCG tablet, Take 1,000 mcg by mouth daily., Disp: , Rfl:    warfarin (COUMADIN) 5 MG tablet, Take 1 tablet (5 mg total) by mouth daily. Refer to most recent anticoagulation note for most updated dosing instructions. (Patient taking differently: Take 5 mg by mouth daily. Or as directed by the coumadin clinic  Currently on: 5 mg every Mon; 2.5 mg all other days), Disp: 90 tablet, Rfl: 3 Past Medical History:  Diagnosis Date   CHF (congestive heart failure) (HCC)    Heart failure (HCC)    Hypertension    Morbid obesity (HCC)    Multiple sclerosis (HCC)    Paroxysmal atrial fibrillation (HCC)     ASSESSMENT  Recent Results: The most recent result is correlated with 20 mg per week:  Lab Results  Component Value Date   INR 2.0 05/25/2021   INR 2.5 05/19/2021   INR 2.5 05/11/2021    Anticoagulation Dosing: Description   INR at goal. Increase weekly dose of 5 mg every Mon, Wed and 2.5 mg  all other days. Recheck INR in 1 week.      INR today: Therapeutic. INR trending down with current baseline Vit K intake. Pt reports maintaining baseline Vit K intake of 4-5 serving/week. Pt denies any complains of unusual bleeding or bruising symptoms. Denies any other recent changes in diet, medication, or lifestyles. Denies any s/sx of thromboembolic symptoms. Will increase weekly dose slightly to maintain INR close to 2.5. .    PLAN Weekly dose was increased by 12.5 % to 22.5 mg/week. Increase weekly dose of 5 mg every Mon, Wed and 2.5 mg all other days. Recheck INR in 1 week. Continue to maintain 4-5 servings of high Vit K/week.   Patient Instructions   INR at goal. Increase weekly dose of 5 mg every Mon, Wed and 2.5 mg all other days. Recheck INR in 1 week.  Patient advised to contact clinic or seek medical attention if signs/symptoms of bleeding or thromboembolism occur.  Patient verbalized understanding by repeating back information and was advised to contact me if further medication-related questions arise.   Follow-up Return in about 1 week (around 06/01/2021).  Leonides Schanz, PharmD  15 minutes spent face-to-face with the patient during the encounter. 50% of time spent on education, including signs/sx bleeding and clotting, as well as food and drug interactions with warfarin. 50% of time was spent on fingerprick POC INR sample collection,processing, results determination, and documentation

## 2021-06-02 ENCOUNTER — Ambulatory Visit: Payer: Self-pay | Admitting: Pharmacist

## 2021-06-02 DIAGNOSIS — Z5181 Encounter for therapeutic drug level monitoring: Secondary | ICD-10-CM

## 2021-06-02 DIAGNOSIS — I480 Paroxysmal atrial fibrillation: Secondary | ICD-10-CM

## 2021-06-02 LAB — POCT INR: INR: 2.2 (ref 2.0–3.0)

## 2021-06-02 NOTE — Progress Notes (Signed)
Anticoagulation Management Zoe Cobb is a 65 y.o. female who reports to the clinic for monitoring of warfarin treatment.   Indication: atrial fibrillation CHA2DS2 Vasc Score 3 (Age 48-74, Female, CHF hx), HAS-BLED 1 (Age >32)  Duration: indefinite Supervising physician: Yates Decamp  Anticoagulation Clinic Visit History:  Patient does not report signs/symptoms of bleeding or thromboembolism  Other recent changes: Pt maintaining 4-5 serving of high Vit K meals/weeks.   Reports to having Vit K intake of broccoli, Kale, Asparagus, spinach, collard greens on a rotating bases.   Remains consistent with her Vit K intake. Pt reports that she recently ran out of her INR supplies. Was able to get the delivery today and will return to weekly checks every Wednesday moving forward.   Anticoagulation Episode Summary     Current INR goal:  2.0-3.0  TTR:  52.3 % (2.6 y)  Next INR check:  06/15/2021  INR from last check:  2.2 (06/02/2021)  Weekly max warfarin dose:    Target end date:    INR check location:    Preferred lab:    Send INR reminders to:     Indications   Atrial fibrillation (HCC) [I48.91] Monitoring for long-term anticoagulant use [Z51.81 Z79.01]        Comments:           Allergies  Allergen Reactions   Penicillins Swelling    Has patient had a PCN reaction causing immediate rash, facial/tongue/throat swelling, SOB or lightheadedness with hypotension: Yes Has patient had a PCN reaction causing severe rash involving mucus membranes or skin necrosis: No Has patient had a PCN reaction that required hospitalization No Has patient had a PCN reaction occurring within the last 10 years: No If all of the above answers are "NO", then may proceed with Cephalosporin use.     Current Outpatient Medications:    cholecalciferol (VITAMIN D) 1000 units tablet, Take 1,000 Units by mouth daily., Disp: , Rfl:    isosorbide mononitrate (IMDUR) 60 MG 24 hr tablet, Take 60 mg by  mouth daily., Disp: , Rfl:    metoprolol succinate (TOPROL-XL) 50 MG 24 hr tablet, Take 50 mg by mouth daily., Disp: , Rfl:    senna-docusate (SENOKOT-S) 8.6-50 MG tablet, Take 1 tablet by mouth at bedtime as needed for mild constipation., Disp: 30 tablet, Rfl: 0   torsemide (DEMADEX) 20 MG tablet, Take 1 tablet by mouth in the morning and at bedtime., Disp: , Rfl:    traMADol (ULTRAM) 50 MG tablet, Take 50 mg by mouth every 8 (eight) hours as needed., Disp: , Rfl:    vitamin B-12 (CYANOCOBALAMIN) 1000 MCG tablet, Take 1,000 mcg by mouth daily., Disp: , Rfl:    warfarin (COUMADIN) 5 MG tablet, Take 1 tablet (5 mg total) by mouth daily. Refer to most recent anticoagulation note for most updated dosing instructions. (Patient taking differently: Take 5 mg by mouth daily. Or as directed by the coumadin clinic  Currently on: 5 mg every Mon; 2.5 mg all other days), Disp: 90 tablet, Rfl: 3 Past Medical History:  Diagnosis Date   CHF (congestive heart failure) (HCC)    Heart failure (HCC)    Hypertension    Morbid obesity (HCC)    Multiple sclerosis (HCC)    Paroxysmal atrial fibrillation (HCC)     ASSESSMENT  Recent Results: The most recent result is correlated with 22.5 mg per week:  Lab Results  Component Value Date   INR 2.2 06/02/2021   INR 2.0 05/25/2021  INR 2.5 05/19/2021    Anticoagulation Dosing: Description   INR at goal. Continue current weekly dose of 5 mg every Mon, Wed and 2.5 mg all other days. Recheck INR in 1 week.      INR today: Therapeutic. Continues to remain therapeutic following recent weekly dose increase. Pt reports maintaining baseline Vit K intake of 4-5 serving/week. Pt denies any complains of unusual bleeding or bruising symptoms. Denies any other recent changes in diet, medication, or lifestyles. Denies any s/sx of thromboembolic symptoms. Will continue current weekly dose and continue close monitoring.    PLAN Weekly dose was unchanged by 0 % to 22.5  mg/week. Continue current weekly dose of 5 mg every Mon, Wed and 2.5 mg all other days. Recheck INR in 1 week. Continue to maintain 4-5 servings of high Vit K/week.   Patient Instructions  INR at goal. Continue current weekly dose of 5 mg every Mon, Wed and 2.5 mg all other days. Recheck INR in 1 week.  Patient advised to contact clinic or seek medical attention if signs/symptoms of bleeding or thromboembolism occur.  Patient verbalized understanding by repeating back information and was advised to contact me if further medication-related questions arise.   Follow-up Return in about 6 days (around 06/08/2021).  Leonides Schanz, PharmD  15 minutes spent face-to-face with the patient during the encounter. 50% of time spent on education, including signs/sx bleeding and clotting, as well as food and drug interactions with warfarin. 50% of time was spent on fingerprick POC INR sample collection,processing, results determination, and documentation

## 2021-06-02 NOTE — Patient Instructions (Signed)
INR at goal. Continue current weekly dose of 5 mg every Mon, Wed and 2.5 mg all other days. Recheck INR in 1 week.

## 2021-06-08 ENCOUNTER — Ambulatory Visit: Payer: Self-pay | Admitting: Pharmacist

## 2021-06-08 DIAGNOSIS — I480 Paroxysmal atrial fibrillation: Secondary | ICD-10-CM

## 2021-06-08 DIAGNOSIS — Z7901 Long term (current) use of anticoagulants: Secondary | ICD-10-CM

## 2021-06-08 DIAGNOSIS — Z5181 Encounter for therapeutic drug level monitoring: Secondary | ICD-10-CM

## 2021-06-08 LAB — POCT INR: INR: 2.2 (ref 2.0–3.0)

## 2021-06-08 NOTE — Patient Instructions (Signed)
INR at goal. Continue current weekly dose of 5 mg every Mon, Wed and 2.5 mg all other days. Recheck INR in 1 week.

## 2021-06-08 NOTE — Progress Notes (Signed)
Anticoagulation Management Zoe Cobb is a 65 y.o. female who reports to the clinic for monitoring of warfarin treatment.   Indication: atrial fibrillation CHA2DS2 Vasc Score 3 (Age 73-74, Female, CHF hx), HAS-BLED 1 (Age >11)  Duration: indefinite Supervising physician: Yates Decamp  Anticoagulation Clinic Visit History:  Patient does not report signs/symptoms of bleeding or thromboembolism  Other recent changes: Pt maintaining 4-5 serving of high Vit K meals/weeks.   Reports to having Vit K intake of broccoli, Kale, Asparagus, spinach, collard greens on a rotating bases.   Remains consistent with her Vit K intake. Pt still waiting for her wheelchair to be fixed in order to be able to schedule her follow up OV w/ Dr. Jacinto Halim.   Anticoagulation Episode Summary     Current INR goal:  2.0-3.0  TTR:  52.6 % (2.6 y)  Next INR check:  06/15/2021  INR from last check:  2.2 (06/08/2021)  Weekly max warfarin dose:    Target end date:    INR check location:    Preferred lab:    Send INR reminders to:     Indications   Atrial fibrillation (HCC) [I48.91] Monitoring for long-term anticoagulant use [Z51.81 Z79.01]        Comments:           Allergies  Allergen Reactions   Penicillins Swelling    Has patient had a PCN reaction causing immediate rash, facial/tongue/throat swelling, SOB or lightheadedness with hypotension: Yes Has patient had a PCN reaction causing severe rash involving mucus membranes or skin necrosis: No Has patient had a PCN reaction that required hospitalization No Has patient had a PCN reaction occurring within the last 10 years: No If all of the above answers are "NO", then may proceed with Cephalosporin use.     Current Outpatient Medications:    cholecalciferol (VITAMIN D) 1000 units tablet, Take 1,000 Units by mouth daily., Disp: , Rfl:    isosorbide mononitrate (IMDUR) 60 MG 24 hr tablet, Take 60 mg by mouth daily., Disp: , Rfl:    metoprolol  succinate (TOPROL-XL) 50 MG 24 hr tablet, Take 50 mg by mouth daily., Disp: , Rfl:    senna-docusate (SENOKOT-S) 8.6-50 MG tablet, Take 1 tablet by mouth at bedtime as needed for mild constipation., Disp: 30 tablet, Rfl: 0   torsemide (DEMADEX) 20 MG tablet, Take 1 tablet by mouth in the morning and at bedtime., Disp: , Rfl:    traMADol (ULTRAM) 50 MG tablet, Take 50 mg by mouth every 8 (eight) hours as needed., Disp: , Rfl:    vitamin B-12 (CYANOCOBALAMIN) 1000 MCG tablet, Take 1,000 mcg by mouth daily., Disp: , Rfl:    warfarin (COUMADIN) 5 MG tablet, Take 1 tablet (5 mg total) by mouth daily. Refer to most recent anticoagulation note for most updated dosing instructions. (Patient taking differently: Take 5 mg by mouth daily. Or as directed by the coumadin clinic  Currently on: 5 mg every Mon; 2.5 mg all other days), Disp: 90 tablet, Rfl: 3 Past Medical History:  Diagnosis Date   CHF (congestive heart failure) (HCC)    Heart failure (HCC)    Hypertension    Morbid obesity (HCC)    Multiple sclerosis (HCC)    Paroxysmal atrial fibrillation (HCC)     ASSESSMENT  Recent Results: The most recent result is correlated with 22.5 mg per week:  Lab Results  Component Value Date   INR 2.2 06/08/2021   INR 2.2 06/02/2021   INR 2.0 05/25/2021  Anticoagulation Dosing: Description   INR at goal. Continue current weekly dose of 5 mg every Mon, Wed and 2.5 mg all other days. Recheck INR in 1 week.      INR today: Therapeutic. Continues to remain therapeutic following recent weekly dose increase. Pt reports maintaining baseline Vit K intake of 4-5 serving/week. Pt denies any complains of unusual bleeding or bruising symptoms. Denies any other recent changes in diet, medication, or lifestyles. Denies any s/sx of thromboembolic symptoms. Will continue current weekly dose and continue close monitoring.    PLAN Weekly dose was unchanged by 0 % to 22.5 mg/week. Continue current weekly dose of 5 mg  every Mon, Wed and 2.5 mg all other days. Recheck INR in 1 week. Continue to maintain 4-5 servings of high Vit K/week.   Patient Instructions  INR at goal. Continue current weekly dose of 5 mg every Mon, Wed and 2.5 mg all other days. Recheck INR in 1 week.  Patient advised to contact clinic or seek medical attention if signs/symptoms of bleeding or thromboembolism occur.  Patient verbalized understanding by repeating back information and was advised to contact me if further medication-related questions arise.   Follow-up Return in about 1 week (around 06/15/2021).  Alysia Penna, PharmD  15 minutes spent face-to-face with the patient during the encounter. 50% of time spent on education, including signs/sx bleeding and clotting, as well as food and drug interactions with warfarin. 50% of time was spent on fingerprick POC INR sample collection,processing, results determination, and documentation

## 2021-06-15 ENCOUNTER — Ambulatory Visit: Payer: Self-pay | Admitting: Pharmacist

## 2021-06-15 DIAGNOSIS — Z5181 Encounter for therapeutic drug level monitoring: Secondary | ICD-10-CM

## 2021-06-15 DIAGNOSIS — Z7901 Long term (current) use of anticoagulants: Secondary | ICD-10-CM

## 2021-06-15 DIAGNOSIS — I480 Paroxysmal atrial fibrillation: Secondary | ICD-10-CM

## 2021-06-15 LAB — POCT INR: INR: 2.8 (ref 2.0–3.0)

## 2021-06-15 NOTE — Patient Instructions (Signed)
INR at goal. Continue current weekly dose of 5 mg every Mon, Wed and 2.5 mg all other days. Recheck INR in 1 week.

## 2021-06-15 NOTE — Progress Notes (Signed)
Anticoagulation Management Zoe Cobb is a 65 y.o. female who reports to the clinic for monitoring of warfarin treatment.   Indication: atrial fibrillation CHA2DS2 Vasc Score 3 (Age 67-74, Female, CHF hx), HAS-BLED 1 (Age >42)  Duration: indefinite Supervising physician: Adrian Prows  Anticoagulation Clinic Visit History:  Patient does not report signs/symptoms of bleeding or thromboembolism  Other recent changes: Pt maintaining 4-5 serving of high Vit K meals/weeks.   Reports to having Vit K intake of broccoli, Kale, Asparagus, spinach, collard greens on a rotating bases.   Remains consistent with her Vit K intake. Pt still waiting for her wheelchair to be fixed in order to be able to schedule her follow up OV w/ Dr. Einar Gip.   Anticoagulation Episode Summary     Current INR goal:  2.0-3.0  TTR:  52.9 % (2.7 y)  Next INR check:  06/22/2021  INR from last check:  2.8 (06/15/2021)  Weekly max warfarin dose:    Target end date:    INR check location:    Preferred lab:    Send INR reminders to:     Indications   Atrial fibrillation (HCC) [I48.91] Monitoring for long-term anticoagulant use [Z51.81 Z79.01]        Comments:           Allergies  Allergen Reactions   Penicillins Swelling    Has patient had a PCN reaction causing immediate rash, facial/tongue/throat swelling, SOB or lightheadedness with hypotension: Yes Has patient had a PCN reaction causing severe rash involving mucus membranes or skin necrosis: No Has patient had a PCN reaction that required hospitalization No Has patient had a PCN reaction occurring within the last 10 years: No If all of the above answers are "NO", then may proceed with Cephalosporin use.     Current Outpatient Medications:    cholecalciferol (VITAMIN D) 1000 units tablet, Take 1,000 Units by mouth daily., Disp: , Rfl:    isosorbide mononitrate (IMDUR) 60 MG 24 hr tablet, Take 60 mg by mouth daily., Disp: , Rfl:    metoprolol  succinate (TOPROL-XL) 50 MG 24 hr tablet, Take 50 mg by mouth daily., Disp: , Rfl:    senna-docusate (SENOKOT-S) 8.6-50 MG tablet, Take 1 tablet by mouth at bedtime as needed for mild constipation., Disp: 30 tablet, Rfl: 0   torsemide (DEMADEX) 20 MG tablet, Take 1 tablet by mouth in the morning and at bedtime., Disp: , Rfl:    traMADol (ULTRAM) 50 MG tablet, Take 50 mg by mouth every 8 (eight) hours as needed., Disp: , Rfl:    vitamin B-12 (CYANOCOBALAMIN) 1000 MCG tablet, Take 1,000 mcg by mouth daily., Disp: , Rfl:    warfarin (COUMADIN) 5 MG tablet, Take 1 tablet (5 mg total) by mouth daily. Refer to most recent anticoagulation note for most updated dosing instructions. (Patient taking differently: Take 5 mg by mouth daily. Or as directed by the coumadin clinic  Currently on: 5 mg every Mon; 2.5 mg all other days), Disp: 90 tablet, Rfl: 3 Past Medical History:  Diagnosis Date   CHF (congestive heart failure) (HCC)    Heart failure (HCC)    Hypertension    Morbid obesity ([place])    Multiple sclerosis ([place])    Paroxysmal atrial fibrillation ([place])     ASSESSMENT  Recent Results: The most recent result is correlated with 22.5 mg per week:  Lab Results  Component Value Date   INR 2.8 06/15/2021   INR 2.2 06/08/2021   INR 2.2 06/02/2021  Anticoagulation Dosing: Description   INR at goal. Continue current weekly dose of 5 mg every Mon, Wed and 2.5 mg all other days. Recheck INR in 1 week.      INR today: Therapeutic. Continues to remain therapeutic following recent weekly dose increase. Pt reports maintaining baseline Vit K intake of 4-5 serving/week. Pt denies any complains of unusual bleeding or bruising symptoms. Denies any other recent changes in diet, medication, or lifestyles. Denies any s/sx of thromboembolic symptoms. Will continue current weekly dose and continue close monitoring.    PLAN Weekly dose was unchanged by 0 % to 22.5 mg/week. Continue current weekly dose of 5 mg  every Mon, Wed and 2.5 mg all other days. Recheck INR in 1 week. Continue to maintain 4-5 servings of high Vit K/week.   Patient Instructions  INR at goal. Continue current weekly dose of 5 mg every Mon, Wed and 2.5 mg all other days. Recheck INR in 1 week.  Patient advised to contact clinic or seek medical attention if signs/symptoms of bleeding or thromboembolism occur.  Patient verbalized understanding by repeating back information and was advised to contact me if further medication-related questions arise.   Follow-up Return in about 1 week (around 06/22/2021).  Leonides Schanz, PharmD  15 minutes spent face-to-face with the patient during the encounter. 50% of time spent on education, including signs/sx bleeding and clotting, as well as food and drug interactions with warfarin. 50% of time was spent on fingerprick POC INR sample collection,processing, results determination, and documentation

## 2021-06-29 ENCOUNTER — Ambulatory Visit: Payer: Self-pay | Admitting: Pharmacist

## 2021-06-29 DIAGNOSIS — Z5181 Encounter for therapeutic drug level monitoring: Secondary | ICD-10-CM

## 2021-06-29 DIAGNOSIS — Z7901 Long term (current) use of anticoagulants: Secondary | ICD-10-CM

## 2021-06-29 DIAGNOSIS — I480 Paroxysmal atrial fibrillation: Secondary | ICD-10-CM

## 2021-06-29 LAB — POCT INR: INR: 2.5 (ref 2.0–3.0)

## 2021-06-29 NOTE — Patient Instructions (Signed)
INR at goal. Continue current weekly dose of 5 mg every Mon, Wed and 2.5 mg all other days. Recheck INR in 1 week.

## 2021-06-29 NOTE — Progress Notes (Signed)
Anticoagulation Management Zoe ShinglesBarbara Cobb is a 65 y.o. female who reports to the clinic for monitoring of warfarin treatment.   Indication: atrial fibrillation CHA2DS2 Vasc Score 3 (Age 67-74, Female, CHF hx), HAS-BLED 1 (Age 67>65)  Duration: indefinite Supervising physician: Yates DecampJay Ganji  Anticoagulation Clinic Visit History:  Patient does not report signs/symptoms of bleeding or thromboembolism  Other recent changes: Pt maintaining 4-5 serving of high Vit K meals/weeks.   Reports to having Vit K intake of broccoli, Kale, Asparagus, spinach, collard greens on a rotating bases.   Remains consistent with her Vit K intake. Pt still waiting for her wheelchair to be fixed in order to be able to schedule her follow up OV w/ Dr. Jacinto HalimGanji.   No other significant changes since last check  Anticoagulation Episode Summary     Current INR goal:  2.0-3.0  TTR:  53.6 % (2.7 y)  Next INR check:  07/06/2021  INR from last check:  2.5 (06/29/2021)  Weekly max warfarin dose:    Target end date:    INR check location:    Preferred lab:    Send INR reminders to:     Indications   Atrial fibrillation (HCC) [I48.91] Monitoring for long-term anticoagulant use [Z51.81 Z79.01]        Comments:           Allergies  Allergen Reactions   Penicillins Swelling    Has patient had a PCN reaction causing immediate rash, facial/tongue/throat swelling, SOB or lightheadedness with hypotension: Yes Has patient had a PCN reaction causing severe rash involving mucus membranes or skin necrosis: No Has patient had a PCN reaction that required hospitalization No Has patient had a PCN reaction occurring within the last 10 years: No If all of the above answers are "NO", then may proceed with Cephalosporin use.     Current Outpatient Medications:    cholecalciferol (VITAMIN D) 1000 units tablet, Take 1,000 Units by mouth daily., Disp: , Rfl:    isosorbide mononitrate (IMDUR) 60 MG 24 hr tablet, Take 60 mg by  mouth daily., Disp: , Rfl:    metoprolol succinate (TOPROL-XL) 50 MG 24 hr tablet, Take 50 mg by mouth daily., Disp: , Rfl:    senna-docusate (SENOKOT-S) 8.6-50 MG tablet, Take 1 tablet by mouth at bedtime as needed for mild constipation., Disp: 30 tablet, Rfl: 0   torsemide (DEMADEX) 20 MG tablet, Take 1 tablet by mouth in the morning and at bedtime., Disp: , Rfl:    traMADol (ULTRAM) 50 MG tablet, Take 50 mg by mouth every 8 (eight) hours as needed., Disp: , Rfl:    vitamin B-12 (CYANOCOBALAMIN) 1000 MCG tablet, Take 1,000 mcg by mouth daily., Disp: , Rfl:    warfarin (COUMADIN) 5 MG tablet, Take 1 tablet (5 mg total) by mouth daily. Refer to most recent anticoagulation note for most updated dosing instructions. (Patient taking differently: Take 5 mg by mouth daily. Or as directed by the coumadin clinic  Currently on: 5 mg every Mon; 2.5 mg all other days), Disp: 90 tablet, Rfl: 3 Past Medical History:  Diagnosis Date   CHF (congestive heart failure) (HCC)    Heart failure (HCC)    Hypertension    Morbid obesity (HCC)    Multiple sclerosis (HCC)    Paroxysmal atrial fibrillation (HCC)     ASSESSMENT  Recent Results: The most recent result is correlated with 22.5 mg per week:  Lab Results  Component Value Date   INR 2.5 06/29/2021  INR 2.8 06/15/2021   INR 2.2 06/08/2021    Anticoagulation Dosing: Description   INR at goal. Continue current weekly dose of 5 mg every Mon, Wed and 2.5 mg all other days. Recheck INR in 1 week.      INR today: Therapeutic. Continues to remain therapeutic following recent weekly dose increase. Pt reports maintaining baseline Vit K intake of 4-5 serving/week. Pt denies any complains of unusual bleeding or bruising symptoms. Denies any other recent changes in diet, medication, or lifestyles. Denies any s/sx of thromboembolic symptoms. Will continue current weekly dose and continue close monitoring.    PLAN Weekly dose was unchanged by 0 % to 22.5  mg/week. Continue current weekly dose of 5 mg every Mon, Wed and 2.5 mg all other days. Recheck INR in 1 week. Continue to maintain 4-5 servings of high Vit K/week.   Patient Instructions  INR at goal. Continue current weekly dose of 5 mg every Mon, Wed and 2.5 mg all other days. Recheck INR in 1 week.  Patient advised to contact clinic or seek medical attention if signs/symptoms of bleeding or thromboembolism occur.  Patient verbalized understanding by repeating back information and was advised to contact me if further medication-related questions arise.   Follow-up Return in about 1 week (around 07/06/2021).  Leonides Schanz, PharmD  15 minutes spent face-to-face with the patient during the encounter. 50% of time spent on education, including signs/sx bleeding and clotting, as well as food and drug interactions with warfarin. 50% of time was spent on fingerprick POC INR sample collection,processing, results determination, and documentation

## 2021-07-06 ENCOUNTER — Ambulatory Visit: Payer: Self-pay | Admitting: Pharmacist

## 2021-07-06 DIAGNOSIS — I480 Paroxysmal atrial fibrillation: Secondary | ICD-10-CM

## 2021-07-06 DIAGNOSIS — Z7901 Long term (current) use of anticoagulants: Secondary | ICD-10-CM

## 2021-07-06 LAB — POCT INR: INR: 2.2 (ref 2.0–3.0)

## 2021-07-06 NOTE — Progress Notes (Signed)
Anticoagulation Management Zoe ShinglesBarbara Cobb is a 65 y.o. female who reports to the clinic for monitoring of warfarin treatment.   Indication: atrial fibrillation CHA2DS2 Vasc Score 3 (Age 67-74, Female, CHF hx), HAS-BLED 1 (Age 67>65)  Duration: indefinite Supervising physician: Yates DecampJay Ganji  Anticoagulation Clinic Visit History:  Patient does not report signs/symptoms of bleeding or thromboembolism  Other recent changes: Pt maintaining 4-5 serving of high Vit K meals/weeks.   Reports to having Vit K intake of broccoli, Kale, Asparagus, spinach, collard greens on a rotating bases.   Remains consistent with her Vit K intake. Pt still waiting for her wheelchair to be fixed in order to be able to schedule her follow up OV w/ Dr. Jacinto HalimGanji.   No other significant changes since last check  Anticoagulation Episode Summary     Current INR goal:  2.0-3.0  TTR:  53.9 % (2.7 y)  Next INR check:  07/13/2021  INR from last check:  2.2 (07/06/2021)  Weekly max warfarin dose:    Target end date:    INR check location:    Preferred lab:    Send INR reminders to:     Indications   Atrial fibrillation (HCC) [I48.91] Monitoring for long-term anticoagulant use [Z51.81 Z79.01]        Comments:           Allergies  Allergen Reactions   Penicillins Swelling    Has patient had a PCN reaction causing immediate rash, facial/tongue/throat swelling, SOB or lightheadedness with hypotension: Yes Has patient had a PCN reaction causing severe rash involving mucus membranes or skin necrosis: No Has patient had a PCN reaction that required hospitalization No Has patient had a PCN reaction occurring within the last 10 years: No If all of the above answers are "NO", then may proceed with Cephalosporin use.     Current Outpatient Medications:    cholecalciferol (VITAMIN D) 1000 units tablet, Take 1,000 Units by mouth daily., Disp: , Rfl:    isosorbide mononitrate (IMDUR) 60 MG 24 hr tablet, Take 60 mg by  mouth daily., Disp: , Rfl:    metoprolol succinate (TOPROL-XL) 50 MG 24 hr tablet, Take 50 mg by mouth daily., Disp: , Rfl:    senna-docusate (SENOKOT-S) 8.6-50 MG tablet, Take 1 tablet by mouth at bedtime as needed for mild constipation., Disp: 30 tablet, Rfl: 0   torsemide (DEMADEX) 20 MG tablet, Take 1 tablet by mouth in the morning and at bedtime., Disp: , Rfl:    traMADol (ULTRAM) 50 MG tablet, Take 50 mg by mouth every 8 (eight) hours as needed., Disp: , Rfl:    vitamin B-12 (CYANOCOBALAMIN) 1000 MCG tablet, Take 1,000 mcg by mouth daily., Disp: , Rfl:    warfarin (COUMADIN) 5 MG tablet, Take 1 tablet (5 mg total) by mouth daily. Refer to most recent anticoagulation note for most updated dosing instructions. (Patient taking differently: Take 5 mg by mouth daily. Or as directed by the coumadin clinic  Currently on: 5 mg every Mon; 2.5 mg all other days), Disp: 90 tablet, Rfl: 3 Past Medical History:  Diagnosis Date   CHF (congestive heart failure) (HCC)    Heart failure (HCC)    Hypertension    Morbid obesity (HCC)    Multiple sclerosis (HCC)    Paroxysmal atrial fibrillation (HCC)     ASSESSMENT  Recent Results: The most recent result is correlated with 22.5 mg per week:  Lab Results  Component Value Date   INR 2.2 07/06/2021  INR 2.5 06/29/2021   INR 2.8 06/15/2021    Anticoagulation Dosing: Description   INR at goal. Continue current weekly dose of 5 mg every Mon, Wed and 2.5 mg all other days. Recheck INR in 1 week.      INR today: Therapeutic. Continues to remain therapeutic following recent weekly dose increase. Pt reports maintaining baseline Vit K intake of 4-5 serving/week. Pt denies any complains of unusual bleeding or bruising symptoms. Denies any other recent changes in diet, medication, or lifestyles. Denies any s/sx of thromboembolic symptoms. Will continue current weekly dose and continue close monitoring.    PLAN Weekly dose was unchanged by 0 % to 22.5  mg/week. Continue current weekly dose of 5 mg every Mon, Wed and 2.5 mg all other days. Recheck INR in 1 week. Continue to maintain 4-5 servings of high Vit K/week.   Patient Instructions  INR at goal. Continue current weekly dose of 5 mg every Mon, Wed and 2.5 mg all other days. Recheck INR in 1 week.  Patient advised to contact clinic or seek medical attention if signs/symptoms of bleeding or thromboembolism occur.  Patient verbalized understanding by repeating back information and was advised to contact me if further medication-related questions arise.   Follow-up Return in about 1 week (around 07/13/2021).  Leonides Schanz, PharmD  15 minutes spent face-to-face with the patient during the encounter. 50% of time spent on education, including signs/sx bleeding and clotting, as well as food and drug interactions with warfarin. 50% of time was spent on fingerprick POC INR sample collection,processing, results determination, and documentation

## 2021-07-06 NOTE — Patient Instructions (Addendum)
INR at goal. Continue current weekly dose of 5 mg every Mon, Wed and 2.5 mg all other days. Recheck INR in 1 week.

## 2021-07-13 ENCOUNTER — Ambulatory Visit: Payer: Self-pay | Admitting: Pharmacist

## 2021-07-13 DIAGNOSIS — I480 Paroxysmal atrial fibrillation: Secondary | ICD-10-CM

## 2021-07-13 DIAGNOSIS — Z5181 Encounter for therapeutic drug level monitoring: Secondary | ICD-10-CM

## 2021-07-13 LAB — POCT INR: INR: 1.8 — AB (ref 2.0–3.0)

## 2021-07-13 NOTE — Patient Instructions (Signed)
INR below goal. Increase weekly dose to 5 mg every Mon, Wed, Fri and 2.5 mg all other days. Recheck INR in 1 week.

## 2021-07-13 NOTE — Progress Notes (Signed)
Anticoagulation Management Zoe Cobb is a 65 y.o. female who reports to the clinic for monitoring of warfarin treatment.   Indication: atrial fibrillation CHA2DS2 Vasc Score 3 (Age 60-74, Female, CHF hx), HAS-BLED 1 (Age >2)  Duration: indefinite Supervising physician: Yates Decamp  Anticoagulation Clinic Visit History:  Patient does not report signs/symptoms of bleeding or thromboembolism  Other recent changes: Pt maintaining 4-5 serving of high Vit K meals/weeks.   Reports to having Vit K intake of broccoli, Kale, Asparagus, spinach, collard greens on a rotating bases.   Remains consistent with her Vit K intake. Pt still waiting for her wheelchair to be fixed in order to be able to schedule her follow up OV w/ Dr. Jacinto Halim.   Pt reports that she has been having more arthritic pain over the past several days. Using PRN tramadol and Tylenol that provides moderate relief. Denies any recent NSAID use.   Anticoagulation Episode Summary     Current INR goal:  2.0-3.0  TTR:  53.9 % (2.7 y)  Next INR check:  07/20/2021  INR from last check:  1.8 (07/13/2021)  Weekly max warfarin dose:    Target end date:    INR check location:    Preferred lab:    Send INR reminders to:     Indications   Atrial fibrillation (HCC) [I48.91] Monitoring for long-term anticoagulant use [Z51.81 Z79.01]        Comments:           Allergies  Allergen Reactions   Penicillins Swelling    Has patient had a PCN reaction causing immediate rash, facial/tongue/throat swelling, SOB or lightheadedness with hypotension: Yes Has patient had a PCN reaction causing severe rash involving mucus membranes or skin necrosis: No Has patient had a PCN reaction that required hospitalization No Has patient had a PCN reaction occurring within the last 10 years: No If all of the above answers are "NO", then may proceed with Cephalosporin use.     Current Outpatient Medications:    cholecalciferol (VITAMIN D) 1000  units tablet, Take 1,000 Units by mouth daily., Disp: , Rfl:    isosorbide mononitrate (IMDUR) 60 MG 24 hr tablet, Take 60 mg by mouth daily., Disp: , Rfl:    metoprolol succinate (TOPROL-XL) 50 MG 24 hr tablet, Take 50 mg by mouth daily., Disp: , Rfl:    senna-docusate (SENOKOT-S) 8.6-50 MG tablet, Take 1 tablet by mouth at bedtime as needed for mild constipation., Disp: 30 tablet, Rfl: 0   torsemide (DEMADEX) 20 MG tablet, Take 1 tablet by mouth in the morning and at bedtime., Disp: , Rfl:    traMADol (ULTRAM) 50 MG tablet, Take 50 mg by mouth every 8 (eight) hours as needed., Disp: , Rfl:    vitamin B-12 (CYANOCOBALAMIN) 1000 MCG tablet, Take 1,000 mcg by mouth daily., Disp: , Rfl:    warfarin (COUMADIN) 5 MG tablet, Take 1 tablet (5 mg total) by mouth daily. Refer to most recent anticoagulation note for most updated dosing instructions. (Patient taking differently: Take 5 mg by mouth daily. Or as directed by the coumadin clinic  Currently on: 5 mg every Mon; 2.5 mg all other days), Disp: 90 tablet, Rfl: 3 Past Medical History:  Diagnosis Date   CHF (congestive heart failure) (HCC)    Heart failure (HCC)    Hypertension    Morbid obesity (HCC)    Multiple sclerosis (HCC)    Paroxysmal atrial fibrillation (HCC)     ASSESSMENT  Recent Results: The most  recent result is correlated with 22.5 mg per week:  Lab Results  Component Value Date   INR 1.8 (A) 07/13/2021   INR 2.2 07/06/2021   INR 2.5 06/29/2021    Anticoagulation Dosing: Description   INR below goal. Increase weekly dose to 5 mg every Mon, Wed, Fri and 2.5 mg all other days. Recheck INR in 1 week.      INR today: Subtherapeutic. INR continues to trend down on current weekly dose. Denies any missed doses or skipped doses. Pt reports maintaining baseline Vit K intake of 4-5 serving/week. Pt denies any complains of unusual bleeding or bruising symptoms. Denies any other recent changes in diet, medication, or lifestyles.  Denies any s/sx of thromboembolic symptoms. Will increase weekly dose and continue close monitoring.    PLAN Weekly dose was increased by 11.1 % to 25 mg/week. Increase weekly dose to 5 mg every Mon, Wed, Fri and 2.5 mg all other days. Recheck INR in 1 week. Continue to maintain 4-5 servings of high Vit K/week.   Patient Instructions  INR below goal. Increase weekly dose to 5 mg every Mon, Wed, Fri and 2.5 mg all other days. Recheck INR in 1 week.  Patient advised to contact clinic or seek medical attention if signs/symptoms of bleeding or thromboembolism occur.  Patient verbalized understanding by repeating back information and was advised to contact me if further medication-related questions arise.   Follow-up Return in about 1 week (around 07/20/2021).  Leonides Schanz, PharmD  15 minutes spent face-to-face with the patient during the encounter. 50% of time spent on education, including signs/sx bleeding and clotting, as well as food and drug interactions with warfarin. 50% of time was spent on fingerprick POC INR sample collection,processing, results determination, and documentation

## 2021-07-20 ENCOUNTER — Ambulatory Visit: Payer: Self-pay | Admitting: Pharmacist

## 2021-07-20 DIAGNOSIS — Z7901 Long term (current) use of anticoagulants: Secondary | ICD-10-CM

## 2021-07-20 DIAGNOSIS — I480 Paroxysmal atrial fibrillation: Secondary | ICD-10-CM

## 2021-07-20 LAB — POCT INR: INR: 2.4 (ref 2.0–3.0)

## 2021-07-20 NOTE — Progress Notes (Signed)
Anticoagulation Management Zoe Cobb is a 65 y.o. female who reports to the clinic for monitoring of warfarin treatment.   Indication: atrial fibrillation CHA2DS2 Vasc Score 3 (Age 48-74, Female, CHF hx), HAS-BLED 1 (Age >63)  Duration: indefinite Supervising physician: Yates Decamp  Anticoagulation Clinic Visit History:  Patient does not report signs/symptoms of bleeding or thromboembolism  Other recent changes: Pt maintaining 4-5 serving of high Vit K meals/weeks.   Reports to having Vit K intake of broccoli, Kale, Asparagus, spinach, collard greens on a rotating bases.   Remains consistent with her Vit K intake. Pt still waiting for her wheelchair to be fixed in order to be able to schedule her follow up OV w/ Dr. Jacinto Halim.   Pt reports that she has been having more arthritic pain over the past several days. Using PRN tramadol and Tylenol that provides moderate relief. Denies any recent NSAID use.   Anticoagulation Episode Summary     Current INR goal:  2.0-3.0  TTR:  54.0 % (2.7 y)  Next INR check:  07/27/2021  INR from last check:  2.4 (07/20/2021)  Weekly max warfarin dose:    Target end date:    INR check location:    Preferred lab:    Send INR reminders to:     Indications   Atrial fibrillation (HCC) [I48.91] Monitoring for long-term anticoagulant use [Z51.81 Z79.01]        Comments:           Allergies  Allergen Reactions   Penicillins Swelling    Has patient had a PCN reaction causing immediate rash, facial/tongue/throat swelling, SOB or lightheadedness with hypotension: Yes Has patient had a PCN reaction causing severe rash involving mucus membranes or skin necrosis: No Has patient had a PCN reaction that required hospitalization No Has patient had a PCN reaction occurring within the last 10 years: No If all of the above answers are "NO", then may proceed with Cephalosporin use.     Current Outpatient Medications:    cholecalciferol (VITAMIN D) 1000  units tablet, Take 1,000 Units by mouth daily., Disp: , Rfl:    isosorbide mononitrate (IMDUR) 60 MG 24 hr tablet, Take 60 mg by mouth daily., Disp: , Rfl:    metoprolol succinate (TOPROL-XL) 50 MG 24 hr tablet, Take 50 mg by mouth daily., Disp: , Rfl:    senna-docusate (SENOKOT-S) 8.6-50 MG tablet, Take 1 tablet by mouth at bedtime as needed for mild constipation., Disp: 30 tablet, Rfl: 0   torsemide (DEMADEX) 20 MG tablet, Take 1 tablet by mouth in the morning and at bedtime., Disp: , Rfl:    traMADol (ULTRAM) 50 MG tablet, Take 50 mg by mouth every 8 (eight) hours as needed., Disp: , Rfl:    vitamin B-12 (CYANOCOBALAMIN) 1000 MCG tablet, Take 1,000 mcg by mouth daily., Disp: , Rfl:    warfarin (COUMADIN) 5 MG tablet, Take 1 tablet (5 mg total) by mouth daily. Refer to most recent anticoagulation note for most updated dosing instructions. (Patient taking differently: Take 5 mg by mouth daily. Or as directed by the coumadin clinic  Currently on: 5 mg every Mon; 2.5 mg all other days), Disp: 90 tablet, Rfl: 3 Past Medical History:  Diagnosis Date   CHF (congestive heart failure) (HCC)    Heart failure (HCC)    Hypertension    Morbid obesity (HCC)    Multiple sclerosis (HCC)    Paroxysmal atrial fibrillation (HCC)     ASSESSMENT  Recent Results: The most  recent result is correlated with 25 mg per week:  Lab Results  Component Value Date   INR 2.4 07/20/2021   INR 1.8 (A) 07/13/2021   INR 2.2 07/06/2021    Anticoagulation Dosing: Description   INR at goal. Continue current weekly dose of 5 mg every Mon, Wed, Fri and 2.5 mg all other days. Recheck INR in 1 week.      INR today: Therapeutic. INR improved following recent weekly dose increase. Denies any missed doses or skipped doses. Pt reports maintaining baseline Vit K intake of 4-5 serving/week. Pt denies any complains of unusual bleeding or bruising symptoms. Denies any other recent changes in diet, medication, or lifestyles.  Denies any s/sx of thromboembolic symptoms. Will continue current weekly dose and continue monitoring.    PLAN Weekly dose was unchanged by 0% to 25 mg/week. Continue current weekly dose of 5 mg every Mon, Wed, Fri and 2.5 mg all other days. Recheck INR in 1 week. Continue to maintain 4-5 servings of high Vit K/week.   Patient Instructions  INR at goal. Continue current weekly dose of 5 mg every Mon, Wed, Fri and 2.5 mg all other days. Recheck INR in 1 week.  Patient advised to contact clinic or seek medical attention if signs/symptoms of bleeding or thromboembolism occur.  Patient verbalized understanding by repeating back information and was advised to contact me if further medication-related questions arise.   Follow-up Return in about 1 week (around 07/27/2021).  Alysia Penna, PharmD  15 minutes spent face-to-face with the patient during the encounter. 50% of time spent on education, including signs/sx bleeding and clotting, as well as food and drug interactions with warfarin. 50% of time was spent on fingerprick POC INR sample collection,processing, results determination, and documentation

## 2021-07-20 NOTE — Patient Instructions (Signed)
INR at goal. Continue current weekly dose of 5 mg every Mon, Wed, Fri and 2.5 mg all other days. Recheck INR in 1 week.

## 2021-07-27 ENCOUNTER — Ambulatory Visit: Payer: Self-pay | Admitting: Pharmacist

## 2021-07-27 DIAGNOSIS — Z5181 Encounter for therapeutic drug level monitoring: Secondary | ICD-10-CM

## 2021-07-27 DIAGNOSIS — I480 Paroxysmal atrial fibrillation: Secondary | ICD-10-CM

## 2021-07-27 LAB — POCT INR: INR: 3.4 — AB (ref 2.0–3.0)

## 2021-07-27 NOTE — Progress Notes (Signed)
Anticoagulation Management Zoe Cobb is a 65 y.o. female who reports to the clinic for monitoring of warfarin treatment.   Indication: atrial fibrillation CHA2DS2 Vasc Score 3 (Age 54-74, Female, CHF hx), HAS-BLED 1 (Age >55)  Duration: indefinite Supervising physician: Zoe Cobb  Anticoagulation Clinic Visit History:  Patient does not report signs/symptoms of bleeding or thromboembolism  Other recent changes: Pt maintaining 4-5 serving of high Vit K meals/weeks.   Reports to having Vit K intake of broccoli, Kale, spinach, collard greens on a rotating bases.   Remains consistent with her Vit K intake. Pt still waiting for her wheelchair to be fixed in order to be able to schedule her follow up OV w/ Dr. Jacinto Cobb.   Pt reports that she ran out of her vegetables recently and hasnt been able to go to the grocery store last week. Planing on returning back to her previous baseline moving forward  Anticoagulation Episode Summary     Current INR goal:  2.0-3.0  TTR:  54.0 % (2.8 y)  Next INR check:  08/03/2021  INR from last check:  3.4 (07/27/2021)  Weekly max warfarin dose:    Target end date:    INR check location:    Preferred lab:    Send INR reminders to:     Indications   Atrial fibrillation (HCC) [I48.91] Monitoring for long-term anticoagulant use [Z51.81 Z79.01]        Comments:           Allergies  Allergen Reactions   Penicillins Swelling    Has patient had a PCN reaction causing immediate rash, facial/tongue/throat swelling, SOB or lightheadedness with hypotension: Yes Has patient had a PCN reaction causing severe rash involving mucus membranes or skin necrosis: No Has patient had a PCN reaction that required hospitalization No Has patient had a PCN reaction occurring within the last 10 years: No If all of the above answers are "NO", then may proceed with Cephalosporin use.     Current Outpatient Medications:    cholecalciferol (VITAMIN D) 1000 units  tablet, Take 1,000 Units by mouth daily., Disp: , Rfl:    isosorbide mononitrate (IMDUR) 60 MG 24 hr tablet, Take 60 mg by mouth daily., Disp: , Rfl:    metoprolol succinate (TOPROL-XL) 50 MG 24 hr tablet, Take 50 mg by mouth daily., Disp: , Rfl:    senna-docusate (SENOKOT-S) 8.6-50 MG tablet, Take 1 tablet by mouth at bedtime as needed for mild constipation., Disp: 30 tablet, Rfl: 0   torsemide (DEMADEX) 20 MG tablet, Take 1 tablet by mouth in the morning and at bedtime., Disp: , Rfl:    traMADol (ULTRAM) 50 MG tablet, Take 50 mg by mouth every 8 (eight) hours as needed., Disp: , Rfl:    vitamin B-12 (CYANOCOBALAMIN) 1000 MCG tablet, Take 1,000 mcg by mouth daily., Disp: , Rfl:    warfarin (COUMADIN) 5 MG tablet, Take 1 tablet (5 mg total) by mouth daily. Refer to most recent anticoagulation note for most updated dosing instructions. (Patient taking differently: Take 5 mg by mouth daily. Or as directed by the coumadin clinic  Currently on: 5 mg every Mon; 2.5 mg all other days), Disp: 90 tablet, Rfl: 3 Past Medical History:  Diagnosis Date   CHF (congestive heart failure) (HCC)    Heart failure (HCC)    Hypertension    Morbid obesity (HCC)    Multiple sclerosis (HCC)    Paroxysmal atrial fibrillation (HCC)     ASSESSMENT  Recent Results: The  most recent result is correlated with 25 mg per week:  Lab Results  Component Value Date   INR 3.4 (A) 07/27/2021   INR 2.4 07/20/2021   INR 1.8 (A) 07/13/2021    Anticoagulation Dosing: Description   INR above goal. Take 2.5 mg today and then continue weekly dose of 5 mg every Mon, Wed, Fri and 2.5 mg all other days. Recheck INR in 1 week      INR today: Supratherapeutic. INR trending up. Recent decreased Vit K intake possibly contributing to the supratheraputic INR reading. Denies any missed doses or skipped doses. Pt agreeable to maintaining baseline Vit K intake of 4-5 serving/week. Pt denies any complains of unusual bleeding or bruising  symptoms. Denies any other recent changes in diet, medication, or lifestyles. Denies any s/sx of thromboembolic symptoms. Will dose adjust today and conitnue close monitoring and follow up. If INR continues to remain elevated at next check, will consider decreasing weekly dose at next INR check.    PLAN Weekly dose was unchanged by 0% to 25 mg/week. Take 2.5 mg today and then continue current weekly dose of 5 mg every Mon, Wed, Fri and 2.5 mg all other days. Recheck INR in 1 week. Continue to maintain 4-5 servings of high Vit K/week.   Patient Instructions  INR above goal. Take 2.5 mg today and then continue weekly dose of 5 mg every Mon, Wed, Fri and 2.5 mg all other days. Recheck INR in 1 week  Patient advised to contact clinic or seek medical attention if signs/symptoms of bleeding or thromboembolism occur.  Patient verbalized understanding by repeating back information and was advised to contact me if further medication-related questions arise.   Follow-up Return in about 1 week (around 08/03/2021).  Zoe Cobb, PharmD  15 minutes spent face-to-face with the patient during the encounter. 50% of time spent on education, including signs/sx bleeding and clotting, as well as food and drug interactions with warfarin. 50% of time was spent on fingerprick POC INR sample collection,processing, results determination, and documentation

## 2021-07-27 NOTE — Patient Instructions (Addendum)
INR above goal. Take 2.5 mg today and then continue weekly dose of 5 mg every Mon, Wed, Fri and 2.5 mg all other days. Recheck INR in 1 week

## 2021-08-10 ENCOUNTER — Ambulatory Visit: Payer: Self-pay | Admitting: Pharmacist

## 2021-08-10 DIAGNOSIS — I480 Paroxysmal atrial fibrillation: Secondary | ICD-10-CM

## 2021-08-10 DIAGNOSIS — Z7901 Long term (current) use of anticoagulants: Secondary | ICD-10-CM

## 2021-08-10 LAB — POCT INR: INR: 3.1 — AB (ref 2.0–3.0)

## 2021-08-10 NOTE — Progress Notes (Signed)
Anticoagulation Management Zoe Cobb is a 66 y.o. female who reports to the clinic for monitoring of warfarin treatment.   Indication: atrial fibrillation CHA2DS2 Vasc Score 3 (Age 48-74, Female, CHF hx), HAS-BLED 1 (Age >9)  Duration: indefinite Supervising physician: Adrian Prows  Anticoagulation Clinic Visit History:  Patient does not report signs/symptoms of bleeding or thromboembolism  Other recent changes: Pt maintaining 4-5 serving of high Vit K meals/weeks.   Reports to having Vit K intake of broccoli, Kale, spinach, collard greens on a rotating bases.   Remains consistent with her Vit K intake. Pt still waiting for her wheelchair to be fixed in order to be able to schedule her follow up OV w/ Dr. Einar Gip.   Pt reports that she ran out of her vegetables recently and hasnt been able to go to the grocery store last week. Planing on returning back to her previous baseline moving forward  Anticoagulation Episode Summary     Current INR goal:  2.0-3.0  TTR:  53.3 % (2.8 y)  Next INR check:  08/17/2021  INR from last check:  3.1 (08/10/2021)  Weekly max warfarin dose:    Target end date:    INR check location:    Preferred lab:    Send INR reminders to:     Indications   Atrial fibrillation (HCC) [I48.91] Monitoring for long-term anticoagulant use [Z51.81 Z79.01]        Comments:           Allergies  Allergen Reactions   Penicillins Swelling    Has patient had a PCN reaction causing immediate rash, facial/tongue/throat swelling, SOB or lightheadedness with hypotension: Yes Has patient had a PCN reaction causing severe rash involving mucus membranes or skin necrosis: No Has patient had a PCN reaction that required hospitalization No Has patient had a PCN reaction occurring within the last 10 years: No If all of the above answers are "NO", then may proceed with Cephalosporin use.     Current Outpatient Medications:    cholecalciferol (VITAMIN D) 1000 units  tablet, Take 1,000 Units by mouth daily., Disp: , Rfl:    isosorbide mononitrate (IMDUR) 60 MG 24 hr tablet, Take 60 mg by mouth daily., Disp: , Rfl:    metoprolol succinate (TOPROL-XL) 50 MG 24 hr tablet, Take 50 mg by mouth daily., Disp: , Rfl:    senna-docusate (SENOKOT-S) 8.6-50 MG tablet, Take 1 tablet by mouth at bedtime as needed for mild constipation., Disp: 30 tablet, Rfl: 0   torsemide (DEMADEX) 20 MG tablet, Take 1 tablet by mouth in the morning and at bedtime., Disp: , Rfl:    traMADol (ULTRAM) 50 MG tablet, Take 50 mg by mouth every 8 (eight) hours as needed., Disp: , Rfl:    vitamin B-12 (CYANOCOBALAMIN) 1000 MCG tablet, Take 1,000 mcg by mouth daily., Disp: , Rfl:    warfarin (COUMADIN) 5 MG tablet, Take 1 tablet (5 mg total) by mouth daily. Refer to most recent anticoagulation note for most updated dosing instructions. (Patient taking differently: Take 5 mg by mouth daily. Or as directed by the coumadin clinic  Currently on: 5 mg every Mon; 2.5 mg all other days), Disp: 90 tablet, Rfl: 3 Past Medical History:  Diagnosis Date   CHF (congestive heart failure) (HCC)    Heart failure (HCC)    Hypertension    Morbid obesity ([place])    Multiple sclerosis ([place])    Paroxysmal atrial fibrillation ([place])     ASSESSMENT  Recent Results: The  most recent result is correlated with 25 mg per week:  Lab Results  Component Value Date   INR 3.1 (A) 08/10/2021   INR 3.4 (A) 07/27/2021   INR 2.4 07/20/2021    Anticoagulation Dosing: Description   INR above goal. Decrease weekly dose to 5 mg every Mon, Fri, and 2.5 mg all other days. Recheck INR in 1 week     INR today: Supratherapeutic. INR trending up. Denies any missed doses or skipped doses. Pt agreeable to maintaining baseline Vit K intake of 4-5 serving/week. Pt denies any complains of unusual bleeding or bruising symptoms. Denies any other recent changes in diet, medication, or lifestyles. Denies any s/sx of thromboembolic symptoms.  In setting of persistently elevated supratheraputic INR readings on current weekly dose, will consider weekly dose decrease and continue close monitoring and follow.    PLAN Weekly dose was decreased by 10% to 22.5 mg/week. Decrease weekly dose to 5 mg every Mon, Fri and 2.5 mg all other days. Recheck INR in 1 week. Continue to maintain 4-5 servings of high Vit K/week.   Patient Instructions  INR above goal. Decrease weekly dose to 5 mg every Mon, Fri, and 2.5 mg all other days. Recheck INR in 1 week Patient advised to contact clinic or seek medical attention if signs/symptoms of bleeding or thromboembolism occur.  Patient verbalized understanding by repeating back information and was advised to contact me if further medication-related questions arise.   Follow-up Return in about 1 week (around 08/17/2021).  Alysia Penna, PharmD  15 minutes spent face-to-face with the patient during the encounter. 50% of time spent on education, including signs/sx bleeding and clotting, as well as food and drug interactions with warfarin. 50% of time was spent on fingerprick POC INR sample collection,processing, results determination, and documentation

## 2021-08-10 NOTE — Patient Instructions (Signed)
INR above goal. Decrease weekly dose to 5 mg every Mon, Fri, and 2.5 mg all other days. Recheck INR in 1 week

## 2021-08-17 ENCOUNTER — Ambulatory Visit: Payer: Self-pay | Admitting: Pharmacist

## 2021-08-17 DIAGNOSIS — Z7901 Long term (current) use of anticoagulants: Secondary | ICD-10-CM

## 2021-08-17 DIAGNOSIS — Z5181 Encounter for therapeutic drug level monitoring: Secondary | ICD-10-CM

## 2021-08-17 DIAGNOSIS — I480 Paroxysmal atrial fibrillation: Secondary | ICD-10-CM

## 2021-08-17 LAB — POCT INR: INR: 3.9 — AB (ref 2.0–3.0)

## 2021-08-17 NOTE — Progress Notes (Signed)
Anticoagulation Management Zoe Cobb is a 66 y.o. female who reports to the clinic for monitoring of warfarin treatment.   Indication: atrial fibrillation CHA2DS2 Vasc Score 3 (Age 65-74, Female, CHF hx), HAS-BLED 1 (Age >23)  Duration: indefinite Supervising physician: Adrian Prows  Anticoagulation Clinic Visit History:  Patient does not report signs/symptoms of bleeding or thromboembolism  Other recent changes: Pt maintaining 4-5 serving of high Vit K meals/weeks.   Reports to having Vit K intake of broccoli, Kale, spinach, collard greens on a rotating bases.   Remains consistent with her Vit K intake. Pt still waiting for her wheelchair to be fixed in order to be able to schedule her follow up OV w/ Dr. Einar Gip.   Anticoagulation Episode Summary     Current INR goal:  2.0-3.0  TTR:  52.9 % (2.8 y)  Next INR check:  08/24/2021  INR from last check:  3.9 (08/17/2021)  Weekly max warfarin dose:    Target end date:    INR check location:    Preferred lab:    Send INR reminders to:     Indications   Atrial fibrillation (HCC) [I48.91] Monitoring for long-term anticoagulant use [Z51.81 Z79.01]        Comments:           Allergies  Allergen Reactions   Penicillins Swelling    Has patient had a PCN reaction causing immediate rash, facial/tongue/throat swelling, SOB or lightheadedness with hypotension: Yes Has patient had a PCN reaction causing severe rash involving mucus membranes or skin necrosis: No Has patient had a PCN reaction that required hospitalization No Has patient had a PCN reaction occurring within the last 10 years: No If all of the above answers are "NO", then may proceed with Cephalosporin use.     Current Outpatient Medications:    cholecalciferol (VITAMIN D) 1000 units tablet, Take 1,000 Units by mouth daily., Disp: , Rfl:    isosorbide mononitrate (IMDUR) 60 MG 24 hr tablet, Take 60 mg by mouth daily., Disp: , Rfl:    metoprolol succinate  (TOPROL-XL) 50 MG 24 hr tablet, Take 50 mg by mouth daily., Disp: , Rfl:    senna-docusate (SENOKOT-S) 8.6-50 MG tablet, Take 1 tablet by mouth at bedtime as needed for mild constipation., Disp: 30 tablet, Rfl: 0   torsemide (DEMADEX) 20 MG tablet, Take 1 tablet by mouth in the morning and at bedtime., Disp: , Rfl:    traMADol (ULTRAM) 50 MG tablet, Take 50 mg by mouth every 8 (eight) hours as needed., Disp: , Rfl:    vitamin B-12 (CYANOCOBALAMIN) 1000 MCG tablet, Take 1,000 mcg by mouth daily., Disp: , Rfl:    warfarin (COUMADIN) 5 MG tablet, Take 1 tablet (5 mg total) by mouth daily. Refer to most recent anticoagulation note for most updated dosing instructions. (Patient taking differently: Take 5 mg by mouth daily. Or as directed by the coumadin clinic  Currently on: 5 mg every Mon; 2.5 mg all other days), Disp: 90 tablet, Rfl: 3 Past Medical History:  Diagnosis Date   CHF (congestive heart failure) (HCC)    Heart failure (HCC)    Hypertension    Morbid obesity ([place])    Multiple sclerosis ([place])    Paroxysmal atrial fibrillation ([place])     ASSESSMENT  Recent Results: The most recent result is correlated with 22.5 mg per week:  Lab Results  Component Value Date   INR 3.9 (A) 08/17/2021   INR 3.1 (A) 08/10/2021   INR 3.4 (  A) 07/27/2021    Anticoagulation Dosing: Description   INR above goal. HOLD today, then decrease dose to 5 mg every Mon, 2.5 mg all other days. Recheck INR in 1 week     INR today: Supratherapeutic. INR continues to trend up despite recent weekly dose decrease. Pt verbalized previously discussed warfarin dosing regimen and denies any extra doses. Denies any recent changes in diet, medications, or lifestyle. Denies any complains of bleeding or bruising symptoms. Will continue close monitoring and ensure INR returns back within therapeutic range   PLAN Weekly dose was decreased by 11.1% to 20 mg/week. HOLD today, then decrease weekly dose to 5 mg every Mon and 2.5  mg all other days. Recheck INR in 1 week. Continue to maintain 4-5 servings of high Vit K/week.   Patient Instructions  INR above goal. HOLD today, then decrease dose to 5 mg every Mon, 2.5 mg all other days. Recheck INR in 1 week Patient advised to contact clinic or seek medical attention if signs/symptoms of bleeding or thromboembolism occur.  Patient verbalized understanding by repeating back information and was advised to contact me if further medication-related questions arise.   Follow-up Return in about 1 week (around 08/24/2021).  Alysia Penna, PharmD  15 minutes spent face-to-face with the patient during the encounter. 50% of time spent on education, including signs/sx bleeding and clotting, as well as food and drug interactions with warfarin. 50% of time was spent on fingerprick POC INR sample collection,processing, results determination, and documentation

## 2021-08-17 NOTE — Patient Instructions (Signed)
INR above goal. HOLD today, then decrease dose to 5 mg every Mon, 2.5 mg all other days. Recheck INR in 1 week

## 2021-08-23 ENCOUNTER — Ambulatory Visit: Payer: 59 | Admitting: Physical Therapy

## 2021-08-24 ENCOUNTER — Ambulatory Visit: Payer: Self-pay | Admitting: Pharmacist

## 2021-08-24 DIAGNOSIS — Z5181 Encounter for therapeutic drug level monitoring: Secondary | ICD-10-CM

## 2021-08-24 DIAGNOSIS — I480 Paroxysmal atrial fibrillation: Secondary | ICD-10-CM

## 2021-08-24 DIAGNOSIS — Z7901 Long term (current) use of anticoagulants: Secondary | ICD-10-CM

## 2021-08-24 LAB — POCT INR: INR: 1.8 — AB (ref 2.0–3.0)

## 2021-08-24 NOTE — Patient Instructions (Signed)
INR below goal. Continue current weekly dose of 5 mg every Mon, 2.5 mg all other days. Recheck INR in 1 week

## 2021-08-24 NOTE — Progress Notes (Signed)
Anticoagulation Management Zoe Cobb is a 66 y.o. female who reports to the clinic for monitoring of warfarin treatment.   Indication: atrial fibrillation CHA2DS2 Vasc Score 3 (Age 44-74, Female, CHF hx), HAS-BLED 1 (Age >15)  Duration: indefinite Supervising physician: Adrian Prows  Anticoagulation Clinic Visit History:  Patient does not report signs/symptoms of bleeding or thromboembolism  Other recent changes: Pt maintaining 4-5 serving of high Vit K meals/weeks.   Reports to having Vit K intake of broccoli, Kale, spinach, collard greens on a rotating bases.   Remains consistent with her Vit K intake. Pt still waiting for her wheelchair to be fixed in order to be able to schedule her follow up OV w/ Dr. Einar Gip.   Anticoagulation Episode Summary     Current INR goal:  2.0-3.0  TTR:  52.8 % (2.8 y)  Next INR check:  08/31/2021  INR from last check:  1.8 (08/24/2021)  Weekly max warfarin dose:    Target end date:    INR check location:    Preferred lab:    Send INR reminders to:     Indications   Atrial fibrillation (HCC) [I48.91] Monitoring for long-term anticoagulant use [Z51.81 Z79.01]        Comments:           Allergies  Allergen Reactions   Penicillins Swelling    Has patient had a PCN reaction causing immediate rash, facial/tongue/throat swelling, SOB or lightheadedness with hypotension: Yes Has patient had a PCN reaction causing severe rash involving mucus membranes or skin necrosis: No Has patient had a PCN reaction that required hospitalization No Has patient had a PCN reaction occurring within the last 10 years: No If all of the above answers are "NO", then may proceed with Cephalosporin use.     Current Outpatient Medications:    cholecalciferol (VITAMIN D) 1000 units tablet, Take 1,000 Units by mouth daily., Disp: , Rfl:    isosorbide mononitrate (IMDUR) 60 MG 24 hr tablet, Take 60 mg by mouth daily., Disp: , Rfl:    metoprolol succinate  (TOPROL-XL) 50 MG 24 hr tablet, Take 50 mg by mouth daily., Disp: , Rfl:    senna-docusate (SENOKOT-S) 8.6-50 MG tablet, Take 1 tablet by mouth at bedtime as needed for mild constipation., Disp: 30 tablet, Rfl: 0   torsemide (DEMADEX) 20 MG tablet, Take 1 tablet by mouth in the morning and at bedtime., Disp: , Rfl:    traMADol (ULTRAM) 50 MG tablet, Take 50 mg by mouth every 8 (eight) hours as needed., Disp: , Rfl:    vitamin B-12 (CYANOCOBALAMIN) 1000 MCG tablet, Take 1,000 mcg by mouth daily., Disp: , Rfl:    warfarin (COUMADIN) 5 MG tablet, Take 1 tablet (5 mg total) by mouth daily. Refer to most recent anticoagulation note for most updated dosing instructions. (Patient taking differently: Take 5 mg by mouth daily. Or as directed by the coumadin clinic  Currently on: 5 mg every Mon; 2.5 mg all other days), Disp: 90 tablet, Rfl: 3 Past Medical History:  Diagnosis Date   CHF (congestive heart failure) (HCC)    Heart failure (HCC)    Hypertension    Morbid obesity ([place])    Multiple sclerosis ([place])    Paroxysmal atrial fibrillation ([place])     ASSESSMENT  Recent Results: The most recent result is correlated with 20 mg per week:  Lab Results  Component Value Date   INR 1.8 (A) 08/24/2021   INR 3.9 (A) 08/17/2021   INR 3.1 (  A) 08/10/2021    Anticoagulation Dosing: Description   INR below goal. Continue current weekly dose of 5 mg every Mon, 2.5 mg all other days. Recheck INR in 1 week     INR today: Subtherapeutic. Labile INR readings. Recent weekly dose adjustments and weekly dose decrease. Denies any recent changes in diet, medications, or lifestyle. Denies any complains of bleeding or bruising symptoms. Pt agreeable to maintain Vit K servings of 3 servings/week. Will continue current weekly dose and continue close monitoring and follow up.    PLAN Weekly dose was unchanged by 0% to 20 mg/week. Continue current weekly dose of 5 mg every Mon and 2.5 mg all other days. Recheck INR in 1  week. Continue to maintain 3-4 servings of high Vit K/week.   Patient Instructions  INR below goal. Continue current weekly dose of 5 mg every Mon, 2.5 mg all other days. Recheck INR in 1 week Patient advised to contact clinic or seek medical attention if signs/symptoms of bleeding or thromboembolism occur.  Patient verbalized understanding by repeating back information and was advised to contact me if further medication-related questions arise.   Follow-up Return in about 1 week (around 08/31/2021).  Alysia Penna, PharmD  15 minutes spent face-to-face with the patient during the encounter. 50% of time spent on education, including signs/sx bleeding and clotting, as well as food and drug interactions with warfarin. 50% of time was spent on fingerprick POC INR sample collection,processing, results determination, and documentation

## 2021-08-25 ENCOUNTER — Inpatient Hospital Stay (HOSPITAL_COMMUNITY)
Admission: EM | Admit: 2021-08-25 | Discharge: 2021-08-29 | DRG: 378 | Disposition: A | Discharge status: Home / Self Care | Payer: 59 | Attending: Internal Medicine | Admitting: Internal Medicine

## 2021-08-25 ENCOUNTER — Other Ambulatory Visit: Payer: Self-pay

## 2021-08-25 ENCOUNTER — Inpatient Hospital Stay (HOSPITAL_COMMUNITY): Payer: 59

## 2021-08-25 ENCOUNTER — Encounter (HOSPITAL_COMMUNITY): Payer: Self-pay

## 2021-08-25 DIAGNOSIS — D62 Acute posthemorrhagic anemia: Secondary | ICD-10-CM | POA: Diagnosis present

## 2021-08-25 DIAGNOSIS — I4891 Unspecified atrial fibrillation: Secondary | ICD-10-CM | POA: Diagnosis present

## 2021-08-25 DIAGNOSIS — K5731 Diverticulosis of large intestine without perforation or abscess with bleeding: Secondary | ICD-10-CM | POA: Diagnosis not present

## 2021-08-25 DIAGNOSIS — I482 Chronic atrial fibrillation, unspecified: Secondary | ICD-10-CM | POA: Diagnosis present

## 2021-08-25 DIAGNOSIS — M7989 Other specified soft tissue disorders: Secondary | ICD-10-CM | POA: Diagnosis present

## 2021-08-25 DIAGNOSIS — R609 Edema, unspecified: Secondary | ICD-10-CM | POA: Diagnosis not present

## 2021-08-25 DIAGNOSIS — G35 Multiple sclerosis: Secondary | ICD-10-CM | POA: Diagnosis present

## 2021-08-25 DIAGNOSIS — Z993 Dependence on wheelchair: Secondary | ICD-10-CM

## 2021-08-25 DIAGNOSIS — K921 Melena: Secondary | ICD-10-CM | POA: Diagnosis not present

## 2021-08-25 DIAGNOSIS — I110 Hypertensive heart disease with heart failure: Secondary | ICD-10-CM | POA: Diagnosis present

## 2021-08-25 DIAGNOSIS — R791 Abnormal coagulation profile: Secondary | ICD-10-CM

## 2021-08-25 DIAGNOSIS — I5032 Chronic diastolic (congestive) heart failure: Secondary | ICD-10-CM | POA: Diagnosis present

## 2021-08-25 DIAGNOSIS — D649 Anemia, unspecified: Secondary | ICD-10-CM

## 2021-08-25 DIAGNOSIS — Z841 Family history of disorders of kidney and ureter: Secondary | ICD-10-CM

## 2021-08-25 DIAGNOSIS — D684 Acquired coagulation factor deficiency: Secondary | ICD-10-CM | POA: Diagnosis present

## 2021-08-25 DIAGNOSIS — I959 Hypotension, unspecified: Secondary | ICD-10-CM | POA: Diagnosis not present

## 2021-08-25 DIAGNOSIS — Z7901 Long term (current) use of anticoagulants: Secondary | ICD-10-CM

## 2021-08-25 DIAGNOSIS — Z833 Family history of diabetes mellitus: Secondary | ICD-10-CM

## 2021-08-25 DIAGNOSIS — K922 Gastrointestinal hemorrhage, unspecified: Secondary | ICD-10-CM | POA: Diagnosis not present

## 2021-08-25 DIAGNOSIS — Z8249 Family history of ischemic heart disease and other diseases of the circulatory system: Secondary | ICD-10-CM

## 2021-08-25 DIAGNOSIS — B182 Chronic viral hepatitis C: Secondary | ICD-10-CM | POA: Diagnosis present

## 2021-08-25 DIAGNOSIS — Z79899 Other long term (current) drug therapy: Secondary | ICD-10-CM

## 2021-08-25 DIAGNOSIS — Z880 Allergy status to penicillin: Secondary | ICD-10-CM

## 2021-08-25 DIAGNOSIS — I10 Essential (primary) hypertension: Secondary | ICD-10-CM | POA: Diagnosis present

## 2021-08-25 DIAGNOSIS — Z87891 Personal history of nicotine dependence: Secondary | ICD-10-CM

## 2021-08-25 DIAGNOSIS — Z6841 Body Mass Index (BMI) 40.0 and over, adult: Secondary | ICD-10-CM

## 2021-08-25 DIAGNOSIS — Z20822 Contact with and (suspected) exposure to covid-19: Secondary | ICD-10-CM | POA: Diagnosis present

## 2021-08-25 LAB — CBC WITH DIFFERENTIAL/PLATELET
Abs Immature Granulocytes: 0.05 10*3/uL (ref 0.00–0.07)
Basophils Absolute: 0 10*3/uL (ref 0.0–0.1)
Basophils Relative: 0 %
Eosinophils Absolute: 0.1 10*3/uL (ref 0.0–0.5)
Eosinophils Relative: 1 %
HCT: 22.2 % — ABNORMAL LOW (ref 36.0–46.0)
Hemoglobin: 6.4 g/dL — CL (ref 12.0–15.0)
Immature Granulocytes: 1 %
Lymphocytes Relative: 20 %
Lymphs Abs: 1.7 10*3/uL (ref 0.7–4.0)
MCH: 20.2 pg — ABNORMAL LOW (ref 26.0–34.0)
MCHC: 28.8 g/dL — ABNORMAL LOW (ref 30.0–36.0)
MCV: 70 fL — ABNORMAL LOW (ref 80.0–100.0)
Monocytes Absolute: 0.5 10*3/uL (ref 0.1–1.0)
Monocytes Relative: 6 %
Neutro Abs: 6.1 10*3/uL (ref 1.7–7.7)
Neutrophils Relative %: 72 %
Platelets: 314 10*3/uL (ref 150–400)
RBC: 3.17 MIL/uL — ABNORMAL LOW (ref 3.87–5.11)
RDW: 19 % — ABNORMAL HIGH (ref 11.5–15.5)
WBC: 8.5 10*3/uL (ref 4.0–10.5)
nRBC: 0 % (ref 0.0–0.2)

## 2021-08-25 LAB — CBC
HCT: 21.1 % — ABNORMAL LOW (ref 36.0–46.0)
Hemoglobin: 6.5 g/dL — CL (ref 12.0–15.0)
MCH: 21.7 pg — ABNORMAL LOW (ref 26.0–34.0)
MCHC: 30.8 g/dL (ref 30.0–36.0)
MCV: 70.3 fL — ABNORMAL LOW (ref 80.0–100.0)
Platelets: 283 10*3/uL (ref 150–400)
RBC: 3 MIL/uL — ABNORMAL LOW (ref 3.87–5.11)
RDW: 20.1 % — ABNORMAL HIGH (ref 11.5–15.5)
WBC: 11 10*3/uL — ABNORMAL HIGH (ref 4.0–10.5)
nRBC: 0 % (ref 0.0–0.2)

## 2021-08-25 LAB — COMPREHENSIVE METABOLIC PANEL
ALT: 9 U/L (ref 0–44)
AST: 22 U/L (ref 15–41)
Albumin: 1.9 g/dL — ABNORMAL LOW (ref 3.5–5.0)
Alkaline Phosphatase: 84 U/L (ref 38–126)
Anion gap: 9 (ref 5–15)
BUN: 20 mg/dL (ref 8–23)
CO2: 28 mmol/L (ref 22–32)
Calcium: 7.8 mg/dL — ABNORMAL LOW (ref 8.9–10.3)
Chloride: 102 mmol/L (ref 98–111)
Creatinine, Ser: 0.86 mg/dL (ref 0.44–1.00)
GFR, Estimated: >60 mL/min (ref >60)
Glucose, Bld: 148 mg/dL — ABNORMAL HIGH (ref 70–99)
Potassium: 4.2 mmol/L (ref 3.5–5.1)
Sodium: 139 mmol/L (ref 135–145)
Total Bilirubin: 0.5 mg/dL (ref 0.3–1.2)
Total Protein: 6.5 g/dL (ref 6.5–8.1)

## 2021-08-25 LAB — RESP PANEL BY RT-PCR (FLU A&B, COVID) ARPGX2
Influenza A by PCR: NEGATIVE
Influenza B by PCR: NEGATIVE
SARS Coronavirus 2 by RT PCR: NEGATIVE

## 2021-08-25 LAB — PROTIME-INR
INR: 4.1 (ref 0.8–1.2)
Prothrombin Time: 39.4 seconds — ABNORMAL HIGH (ref 11.4–15.2)

## 2021-08-25 LAB — TSH: TSH: 3.223 u[IU]/mL (ref 0.350–4.500)

## 2021-08-25 LAB — PREPARE RBC (CROSSMATCH)

## 2021-08-25 MED ORDER — SODIUM CHLORIDE 0.9 % IV BOLUS: 500.0000 mL | Freq: Once | INTRAVENOUS | Status: DC

## 2021-08-25 MED ORDER — METOPROLOL SUCCINATE ER 50 MG PO TB24: 50.0000 mg | ORAL_TABLET | Freq: Every day | ORAL | Status: DC

## 2021-08-25 MED ORDER — PHYTONADIONE 5 MG PO TABS
5.0000 mg | ORAL_TABLET | Freq: Once | ORAL | Status: AC
  Administered 2021-08-25: 5 mg via ORAL
  Filled 2021-08-25: qty 1

## 2021-08-25 MED ORDER — ACETAMINOPHEN 325 MG PO TABS: 650.0000 mg | ORAL_TABLET | Freq: Four times a day (QID) | ORAL | Status: DC | PRN

## 2021-08-25 MED ORDER — SODIUM CHLORIDE 0.9 % IV SOLN: 250.0000 mL | INTRAVENOUS | Status: DC | PRN

## 2021-08-25 MED ORDER — SODIUM CHLORIDE 0.9% FLUSH: 3.0000 mL | INTRAVENOUS | Status: DC | PRN

## 2021-08-25 MED ORDER — SODIUM CHLORIDE 0.9 % IV BOLUS
250.0000 mL | Freq: Once | INTRAVENOUS | Status: AC
  Administered 2021-08-25: 250 mL via INTRAVENOUS

## 2021-08-25 MED ORDER — SODIUM CHLORIDE 0.9% IV SOLUTION: Freq: Once | INTRAVENOUS | Status: DC

## 2021-08-25 MED ORDER — SODIUM CHLORIDE 0.9 % IV SOLN
10.0000 mL/h | Freq: Once | INTRAVENOUS | Status: AC
  Administered 2021-08-25: 10 mL/h via INTRAVENOUS

## 2021-08-25 MED ORDER — TRAMADOL HCL 50 MG PO TABS: 50.0000 mg | ORAL_TABLET | Freq: Three times a day (TID) | ORAL | Status: DC | PRN

## 2021-08-25 MED ORDER — ACETAMINOPHEN 650 MG RE SUPP: 650.0000 mg | Freq: Four times a day (QID) | RECTAL | Status: DC | PRN

## 2021-08-25 MED ORDER — SODIUM CHLORIDE 0.9% FLUSH
3.0000 mL | Freq: Two times a day (BID) | INTRAVENOUS | Status: DC
  Administered 2021-08-25 – 2021-08-29 (×7): 3 mL via INTRAVENOUS

## 2021-08-25 MED ORDER — PANTOPRAZOLE SODIUM 40 MG IV SOLR
40.0000 mg | Freq: Two times a day (BID) | INTRAVENOUS | Status: DC
  Administered 2021-08-25 – 2021-08-27 (×5): 40 mg via INTRAVENOUS
  Filled 2021-08-25 (×5): qty 40

## 2021-08-25 MED ORDER — SODIUM CHLORIDE 0.9 % IV SOLN: INTRAVENOUS | Status: DC

## 2021-08-25 NOTE — ED Triage Notes (Signed)
Caregiver noted dark blood in stool  this am.

## 2021-08-25 NOTE — Assessment & Plan Note (Addendum)
Appears euvolemic Follow I/O Small fluid bolus, monitor volume status Hold toprol and demadex due to hypotension  Echo in 08/2016: EF of 60-65% with grade 2 diastolic dysfunction

## 2021-08-25 NOTE — Assessment & Plan Note (Addendum)
Secondary to GI bleed. hgb 6.4 (? Baseline around 9.4, but no other labs)  Receiving 1 unit PRBC Serial cbc q 6 hours x 2 Transfuse to keep hgb >7

## 2021-08-25 NOTE — Assessment & Plan Note (Signed)
On coumadin, but will check LLE DVT doppler Likely secondary to venous insufficiency

## 2021-08-25 NOTE — ED Notes (Signed)
ED Provider at bedside. 

## 2021-08-25 NOTE — Assessment & Plan Note (Addendum)
66 year old female with known diverticular disease presenting with dark blood per rectum and anemia -place in obs on progressive -clear liquid diet -exam/labs more consistent with lower GI bleed, but with dark stool and will start her on protonix BID -1  Unit PRBC, serial CBC, transfuse to keep hgb >7 -GI consulted, [place]  -last colonoscopy 12/2020 with left sided diverticular disease with no evidence of bleeding

## 2021-08-25 NOTE — Significant Event (Signed)
Post transfusion hgb 6.5 (was 6.4)  Ordered 2 units to be transfused with repeat Cbc post transfusion. Has had some clots changed by nurse, but not actively bleeding.  Dr. Rogers Blocker [facility].

## 2021-08-25 NOTE — Progress Notes (Signed)
Ultrasound to entire Left lower Forearm - nothing visualized to attempt IV access. Right forearm ultrasound done. Able to provide IV access on posterior Right distal forearm with # 22, 1" catheter. RN aware that patient has nothing else on either forearm to make another attempt if this IV does infiltrate.

## 2021-08-25 NOTE — H&P (Signed)
History and Physical    Twilia Berhe [id number] DOB: 22-Mar-1956 DOA: 08/25/2021  PCP: Alvester Chou, NP Consultants:  cardiology: Dr. Einar Gip  Patient coming from:  Home - lives with wife and has 2 CNAs that come in   Chief Complaint: melena  HPI: Zoe Cobb is a 66 y.o. female with medical history significant of atrial fibrillation on coumadin, diastolic CHF, HTN, morbid obesity and chronic hepatitis C who presents to Ed with concerns for GI bleeding. She states symptoms started yesterday. Her caregiver was cleaning her up and noticed she had dark clots in her brief. She states she is unable to see this, but could feel oozing all night and every time she was changed it was dark stool/clots. This was independent of a BM. She denies any other symptoms including stomach pain, diarrhea, N/V or vaginal bleeding. She has overall felt fine.   Had hospital admit in 12/2020 for lower GI bleed due to diverticulosis. Colonoscopy done at that time. Showed left sided diverticulosis without active bleeding.   No fever/chills, no headaches or dizziness or lightheadedness, no chest pain or palpitations, no shortness of breath or cough, no abdominal pain, no N/V/D, no urinary complaints. She does have increased left lower leg and foot swelling. She has not missed any doses of her coumadin.   In ED exam revealed more gross bright red blood per rectum   ED Course: vitals: afebrile, bp: 95/53, HR: 142, RR: 18, oxygen: 98%RA Pertinent labs: hgb: 6.4, INR: 4.1,  Abdominal xray no acute finding In ED: GI consulted, 1 unit of PRBC ordered, vitamin k given and TR[facility] was asked to admit.   Review of Systems: As per HPI; otherwise review of systems reviewed and negative.   Ambulatory Status:  Wheelchair bound    Past Medical History:  Diagnosis Date   CHF (congestive heart failure) ([place])    Heart failure ([place])    Hypertension    Morbid obesity ([place])    Multiple sclerosis (HCC)    Paroxysmal atrial  fibrillation ([place])     Past Surgical History:  Procedure Laterality Date   COLONOSCOPY WITH PROPOFOL N/A 12/15/2020   Procedure: COLONOSCOPY WITH PROPOFOL;  Surgeon: Ladene Artist, MD;  Location: [facility] ENDOSCOPY;  Service: Endoscopy;  Laterality: N/A;    Social History   Socioeconomic History   Marital status: Married    Spouse name: Not on file   Number of children: 2   Years of education: Not on file   Highest education level: Not on file  Occupational History   Not on file  Tobacco Use   Smoking status: Former    Packs/day: 1.00    Years: 9.00    Pack years: 9.00    Types: Cigarettes    Quit date: 19    Years since quitting: 27.0   Smokeless tobacco: Never  Vaping Use   Vaping Use: Never used  Substance and Sexual Activity   Alcohol use: No    Alcohol/week: 0.0 standard drinks   Drug use: No   Sexual activity: Not on file  Other Topics Concern   Not on file  Social History Narrative   Pt lives by herself, she completed hs.    Social Determinants of Health   Financial Resource Strain: Not on file  Food Insecurity: Not on file  Transportation Needs: Not on file  Physical Activity: Not on file  Stress: Not on file  Social Connections: Not on file  Intimate Partner Violence: Not on file  Allergies  Allergen Reactions   Penicillins Swelling    Has patient had a PCN reaction causing immediate rash, facial/tongue/throat swelling, SOB or lightheadedness with hypotension: Yes Has patient had a PCN reaction causing severe rash involving mucus membranes or skin necrosis: No Has patient had a PCN reaction that required hospitalization No Has patient had a PCN reaction occurring within the last 10 years: No If all of the above answers are "NO", then may proceed with Cephalosporin use.     Family History  Problem Relation Age of Onset   Hypertension Mother    Diabetes Mother    Stomach cancer Father    Kidney disease Sister    Diabetes Sister    Diabetes  Sister    Heart attack Brother    HIV/AIDS Brother    Diabetes Brother     Prior to Admission medications   Medication Sig Start Date End Date Taking? Authorizing Provider  acetaminophen (TYLENOL) 325 MG suppository Place 325 mg rectally every 4 (four) hours as needed for mild pain.   Yes [provider]  isosorbide mononitrate (IMDUR) 60 MG 24 hr tablet Take 60 mg by mouth daily. 01/17/21  Yes [provider]  metoprolol succinate (TOPROL-XL) 50 MG 24 hr tablet Take 50 mg by mouth daily. 01/17/21  Yes [provider]  Multiple Vitamins-Minerals (ONE-A-DAY VITACRAVES ADULT) CHEW Chew 1 tablet by mouth daily.   Yes [provider]  torsemide (DEMADEX) 20 MG tablet Take 40 tablets by mouth daily. May take an additional 20mg  at night as needed for fluid/swelling 01/17/21  Yes [provider]  traMADol (ULTRAM) 50 MG tablet Take 50 mg by mouth every 8 (eight) hours as needed for moderate pain. 01/17/21  Yes [provider]  warfarin (COUMADIN) 5 MG tablet Take 1 tablet (5 mg total) by mouth daily. Refer to most recent anticoagulation note for most updated dosing instructions. Patient taking differently: Take 2.5-5 mg by mouth See admin instructions. 5 mg every Mon; 2.5 mg all other days 01/18/21  Yes Adrian Prows, MD    Physical Exam: Vitals:   08/25/21 1130 08/25/21 1200 08/25/21 1230 08/25/21 1300  BP: (!) 132/118 (!) 124/95 99/65 96/67   Pulse: (!) 134 (!) 125 (!) 133 (!) 136  Resp: 16 17 19 20   Temp:      TempSrc:      SpO2: 98% 96% 99% 99%     General:  Appears calm and comfortable and is in NAD. Morbidly obese.  Eyes:  PERRL, EOMI, normal lids, iris ENT:  grossly normal hearing, lips & tongue, mmm; appropriate dentition Neck:  no LAD, masses or thyromegaly; no carotid bruits Cardiovascular:  sinus tachycardia.  no m/r/g. 1+ pitting edema LLE to above the foot.  Respiratory:   CTA bilaterally with no wheezes/rales/rhonchi.  Normal  respiratory effort. Abdomen:  soft, NT, ND, NABS Back:   normal alignment, no CVAT Skin:  no rash or induration seen on limited exam Musculoskeletal:  grossly normal tone, global weakness BUE/BLE, good ROM, no bony abnormality Lower extremity:  Limited foot exam with no ulcerations.  2+ distal pulses. Psychiatric:  grossly normal mood and affect, speech fluent and appropriate, AOx3 Neurologic:  CN 2-12 grossly intact, moves all extremities in coordinated fashion, sensation intact    Radiological Exams on Admission: Independently reviewed - see discussion in A/P where applicable  No results found.  EKG: Independently reviewed.  Sinus tachycardia with rate 138; nonspecific ST changes with no evidence of acute ischemia  Labs on Admission: I have personally reviewed the available labs and imaging studies at the time of the admission.  Pertinent labs:  Hgb: 6.4 Grossly visible blood per rectum per edp    Assessment/Plan * GI bleed- (present on admission) 67 year old female with known diverticular disease presenting with dark blood per rectum and anemia -place in obs on progressive -clear liquid diet -exam/labs more consistent with lower GI bleed, but with dark stool and will start her on protonix BID -1  Unit PRBC, serial CBC, transfuse to keep hgb >7 -GI consulted, [place]  -last colonoscopy 12/2020 with left sided diverticular disease with no evidence of bleeding   Acute on chronic blood loss anemia- (present on admission) Secondary to GI bleed. hgb 6.4 (? Baseline around 9.4, but no other labs)  Receiving 1 unit PRBC Serial cbc q 6 hours x 2 Transfuse to keep hgb >7  Atrial fibrillation ([place])- (present on admission) Sinus tachycardia-small fluid bolus and transfusion, monitor HR  Telemetry Hold coumadin, supra therapeutic with GI bleed. Given vitamin k In Ed.daily INR  Soft BP, hold toprol today   Chronic diastolic (congestive) heart failure (HCC)- (present on  admission) Appears euvolemic Follow I/O Small fluid bolus, monitor volume status Hold toprol and demadex due to hypotension  Echo in 08/2016: EF of 60-65% with grade 2 diastolic dysfunction  Swelling of left lower extremity- (present on admission) On coumadin, but will check LLE DVT doppler Likely secondary to venous insufficiency   Essential hypertension- (present on admission) Soft blood pressures, but on her wrist so unsure of accuracy Hold toprol and imdur today Follow closely with transfusion  Chronic hepatitis C ([place])- (present on admission) S/p treatment in 2018     There is no height or weight on file to calculate BMI.   Level of care: Progressive DVT prophylaxis:   SCDs Code Status:  Full - confirmed with patient Family Communication: None present; I attempted to speak with patient's wife by telephone at the time of admission. Mary: [phone]. Called x 2.  Disposition Plan:  The patient is from: home  Anticipated d/c is to: per day team   Patient placed in observation as anticipate less than 2 midnight stay. Requires hospitalization for GI bleed that requires IVF, blood transfusions, constant monitoring and assessment and MDM and possible intervention with specialists.    Patient is currently: stable Consults called: GI:  Dr. Silverio Decamp  Admission status:  observation    Orma Flaming MD [facility]   How to contact the [facility] Attending or Consulting provider [place] or covering provider during after hours [place], for this patient?  Check the care team in [facility] and look for a) attending/consulting [facility] provider listed and b) the [facility] team listed Log into [email or web address] and use 's universal password to access. If you do not have the password, please contact the hospital operator. Locate the [facility] provider you are looking for under [facility] and page to a number that you can be directly reached. If you still have difficulty reaching the provider,  please page the [facility] (Director on Call) for the Hospitalists listed on amion for assistance.   08/25/2021, 2:20 PM

## 2021-08-25 NOTE — Progress Notes (Signed)
Nurse paged to let me know that HR has been 155/afib. Patient asymptomatic, bp 97-110 systolic. Patient asymptomatic. Getting ekg.   She is resting comfortably and doesn't feel palpitations. Will give home dose of toprol now. Asked that CBC be drawn to make sure she doesn't need another transfusion. Consulted cardiology, Dr. Odis Hollingshead as well.   Dr. Artis Flock [facility]

## 2021-08-25 NOTE — Progress Notes (Signed)
Left lower extremity venous duplex has been completed. Preliminary results can be found in CV Proc through chart review.   08/25/21 4:42 PM Olen Cordial RVT

## 2021-08-25 NOTE — ED Notes (Signed)
Provider at bedside

## 2021-08-25 NOTE — ED Notes (Signed)
Dr Artis Flock notified of HR 150's for approx 45 minutes with BP 97-110 systolic, a-fib, otherwise asymptomatic.

## 2021-08-25 NOTE — ED Notes (Signed)
Lab called, hemoglobin 6.5, Dr. Artis Flock made aware.

## 2021-08-25 NOTE — ED Provider Notes (Signed)
[facility] EMERGENCY DEPARTMENT Provider Note   CSN: [id number] Arrival date & time: 08/25/21  0957     History  Chief Complaint  Patient presents with   Rectal Bleeding    Zoe Cobb is a 66 y.o. female.  66 year old female brought in by EMS from home with concern for dark stools onset last night.  Patient has a history of paroxysmal A. fib, is on Coumadin.  Also history of MS, CHF (echo 2018, EF 55-60%), prior GI bleed (told due to diverticulitis).  Patient is otherwise feeling well today, she denies abdominal pain, fevers, chest pain or shortness of breath, nausea, vomiting, fevers or chills.      Home Medications Prior to Admission medications   Medication Sig Start Date End Date Taking? Authorizing Provider  cholecalciferol (VITAMIN D) 1000 units tablet Take 1,000 Units by mouth daily.    [provider]  isosorbide mononitrate (IMDUR) 60 MG 24 hr tablet Take 60 mg by mouth daily. 01/17/21   [provider]  metoprolol succinate (TOPROL-XL) 50 MG 24 hr tablet Take 50 mg by mouth daily. 01/17/21   [provider]  senna-docusate (SENOKOT-S) 8.6-50 MG tablet Take 1 tablet by mouth at bedtime as needed for mild constipation. 12/16/20   Armando Reichert, MD  torsemide (DEMADEX) 20 MG tablet Take 1 tablet by mouth in the morning and at bedtime. 01/17/21   [provider]  traMADol (ULTRAM) 50 MG tablet Take 50 mg by mouth every 8 (eight) hours as needed. 01/17/21   [provider]  vitamin B-12 (CYANOCOBALAMIN) 1000 MCG tablet Take 1,000 mcg by mouth daily.    [provider]  warfarin (COUMADIN) 5 MG tablet Take 1 tablet (5 mg total) by mouth daily. Refer to most recent anticoagulation note for most updated dosing instructions. Patient taking differently: Take 5 mg by mouth daily. Or as directed by the coumadin clinic  Currently on: 5 mg every Mon; 2.5 mg all other days 01/18/21   Adrian Prows, MD       Allergies    Penicillins    Review of Systems   Review of Systems  Constitutional:  Negative for chills and fever.  Respiratory:  Negative for shortness of breath.   Cardiovascular:  Negative for chest pain.  Gastrointestinal:  Positive for blood in stool. Negative for abdominal pain, constipation, diarrhea, nausea and vomiting.  Genitourinary:  Negative for dysuria.  Musculoskeletal:  Negative for arthralgias and myalgias.  Skin:  Negative for rash and wound.  Allergic/Immunologic: Positive for immunocompromised state.  Neurological:  Negative for weakness.  Hematological:  Bruises/bleeds easily.  Psychiatric/Behavioral:  Negative for confusion.   All other systems reviewed and are negative.  Physical Exam Updated Vital Signs BP (!) 95/53 (BP Location: Left Arm)    Pulse (!) 142    Temp 98.2 F (36.8 C) (Oral)    Resp 18    SpO2 98%  Physical Exam Vitals and nursing note reviewed. Exam conducted with a chaperone present.  Constitutional:      General: She is not in acute distress.    Appearance: She is well-developed. She is obese. She is not diaphoretic.  HENT:     Head: Normocephalic and atraumatic.     Mouth/Throat:     Mouth: Mucous membranes are moist.  Eyes:     Conjunctiva/sclera: Conjunctivae normal.  Cardiovascular:     Rate and Rhythm: Regular rhythm. Tachycardia present.     Heart sounds: Normal heart sounds.  Pulmonary:  Effort: Pulmonary effort is normal.     Breath sounds: Normal breath sounds.  Abdominal:     Palpations: Abdomen is soft.     Tenderness: There is no abdominal tenderness.     Comments: Body habitus limits exam.  Genitourinary:    Comments: Patient was rolled with assistance of staff, found to have bright red blood at the rectum, no identifiable source otherwise. Musculoskeletal:        General: No swelling.  Skin:    General: Skin is warm and dry.     Findings: No erythema or rash.  Neurological:     Mental Status: She is alert  and oriented to person, place, and time.  Psychiatric:        Behavior: Behavior normal.    ED Results / Procedures / Treatments   Labs (all labs ordered are listed, but only abnormal results are displayed) Labs Reviewed  CBC WITH DIFFERENTIAL/PLATELET  COMPREHENSIVE METABOLIC PANEL  PROTIME-INR  POC OCCULT BLOOD, ED    EKG None  Radiology No results found.  Procedures .Critical Care Performed by: Tacy Learn, PA-C Authorized by: Tacy Learn, PA-C   Critical care provider statement:    Critical care time (minutes):  30   Critical care was time spent personally by me on the following activities:  Development of treatment plan with patient or surrogate, discussions with consultants, evaluation of patient's response to treatment, examination of patient, ordering and review of laboratory studies, ordering and review of radiographic studies, ordering and performing treatments and interventions, pulse oximetry, re-evaluation of patient's condition and review of old charts    Medications Ordered in ED Medications - No data to display  ED Course/ Medical Decision Making/ A&P Clinical Course as of 08/25/21 1445  Thu Aug 25, 2021  1209 Prior colonoscopy with Dr. Fuller Plan with [facility] 12/2020. Will plan to consult GI as well as hospitalist for admission. Given oral vitamin K for reversal of patient's coumadin, INR 4.1 with active GI bleed, tachycardic. BP stable when compared to prior on file.  [LM]  1233 Case discussed with Vicie Mutters, PA-C with [facility], GI will see the patient. Pending hospitalist consult for admission. [LM]  1248 Case discussed with Dr. Rogers Blocker, hospitalist, will consult for admission. [LM]    Clinical Course User Index [LM] Tacy Learn, PA-C                           Medical Decision Making Amount and/or Complexity of Data Reviewed Labs: ordered.  Risk Prescription drug management. Decision regarding hospitalization.   This patient  presents to the ED for concern of dark blood from rectum, this involves an extensive number of treatment options, and is a complaint that carries with it a high risk of complications and morbidity.  The differential diagnosis includes but not limited to GI bleed, bleeding wound   Co morbidities that complicate the patient evaluation  Morbid obesity   Additional history obtained:  Additional history obtained from patient's wife at bedside External records from outside source obtained and reviewed including cardiology notes as well as INR trend   Lab Tests:  I Ordered, and personally interpreted labs.  The pertinent results include: CBC with hemoglobin of 6.4, down from her higher of 9.  INR is elevated at 4.1.  CMP with normal BUN.  COVID/flu negative.   Cardiac Monitoring:  The patient was maintained on a cardiac monitor.  I personally viewed and  interpreted the cardiac monitored which showed an underlying rhythm of: Sinus tachycardia   Medicines ordered and prescription drug management:  I ordered medication including oral vitamin K, 1 unit packed red blood cells for anemia, active GI bleed with elevated INR Reevaluation of the patient after these medicines showed that the patient stayed the same I have reviewed the patients home medicines and have made adjustments as needed   Test Considered:  CT scan abdomen pelvis however no abdominal pain at this time, may exceed limits of CT scanner   Critical Interventions:  Transfusion, oral vitamin K   Consultations Obtained:  I requested consultation with the hospitalist and GI service,  and discussed lab and imaging findings as well as pertinent plan - they recommend: Admission   Problem List / ED Course:  65 year old female with concern for dark red blood in her stools onset yesterday.  Found to have bright red blood per rectum with hemoglobin of 6.4 and supratherapeutic INR at 4.1.  Tachycardic with stable blood pressures  when compared to prior on file.   Reevaluation:  After the interventions noted above, I reevaluated the patient and found that they have :stayed the same   Dispostion:  After consideration of the diagnostic results and the patients response to treatment, I feel that the patent would benefit from admission.          Final Clinical Impression(s) / ED Diagnoses Final diagnoses:  None    Rx / DC Orders ED Discharge Orders     None         Roque Lias 08/25/21 1826    Carmin Muskrat, MD 08/26/21 1600

## 2021-08-25 NOTE — Assessment & Plan Note (Addendum)
Sinus tachycardia-small fluid bolus and transfusion, monitor HR  Telemetry Hold coumadin, supra therapeutic with GI bleed. Given vitamin k In Ed.daily INR  Soft BP, hold toprol today

## 2021-08-25 NOTE — Assessment & Plan Note (Signed)
S/p treatment in 2018

## 2021-08-25 NOTE — Consult Note (Addendum)
Consultation  Referring Provider:   Suella Broad, ER PA Primary Care Physician:  Alvester Chou, NP Primary Gastroenterologist:   Althia Forts, seen inpatient 12/2020 for GI bleed      Reason for Consultation:    Anemia, GI bleed    Attending physician's note  I have taken a history, reviewed the chart and examined the patient. I performed a substantive portion of this encounter, including complete performance of at least one of the key components, in conjunction with the APP. I agree with the APP's note, impression and recommendations.    66 year old very pleasant female with morbid obesity, chronic A. fib on Coumadin presented after recurrent episodes of hematochezia and supratherapeutic INR 4.1  She is currently hemodynamically stable Hemoglobin 6.4, baseline 9-10  Reviewed recent colonoscopy, has extensive left-sided diverticulosis  Most likely etiology of hematochezia is acute diverticular hemorrhage Continue to monitor and transfuse to hemoglobin >7 Coumadin on hold Repeat PT and INR tomorrow a.m. Continue clears  If patient has an acute episode of active lower GI bleed, please consult IR for possible thrombo embolization  The patient was provided an opportunity to ask questions and all were answered. The patient agreed with the plan and demonstrated an understanding of the instructions.  Damaris Hippo , MD [phone]     Impression    GI bleed in morbidly obese female with supratherapeutic INR 4.1 on Coumadin for A. Fib History of unremarkable colonoscopy for GI bleeding in 12/2020- thought to be diverticular reports darker stool however on physical exam per ER more bright red blood on DRE BUN 20 which is lower than normal for the patient, not pointing towards acute UGI  Acute on chronic anemia Hemoglobin in the ER found to be 6.4, Baseline around 9 Microcytosis MCV 70 12/12/2020 iron 38, ferritin 59, folate 27, B12 30,   Morbid obesity  Atrial  fibrillation  on Coumadin with supratherapeutic INR 4.1 in the ER  Congestive heart failure Echo 08/2016 grade 2 DD    Plan   Recurrent GI bleed in setting of Coumadin use for atrial fibrillation, supratherapeutic INR 4.1 currently, unremarkable colonoscopy 12/2020 thought to be diverticular. With description of this current episode of bleeding with painless hematochezia, most likely diverticular, BUN normal, no upper GI symptoms, however in the setting of chronic anticoagulation will evaluate with EGD for possible brisk upper GI bleed - hold coumadin, monitor INR in the AM - will need INR around 2 prior to EGD, may proceed with FFP if needed  - continue supportive care -IV Protonix BID dosing - clear liquid diet -Trend Hgb/Hct Q6H at this time for 4 times.  -Continue volume resuscitation as able with Hgb goal >8   Thank you for your kind consultation, we will continue to follow.          HPI:   Zoe Cobb is a 66 y.o. morbidly obese female with history of hypertension, multiple sclerosis, congestive heart failure, paroxysmal atrial fibrillation on Coumadin presents with anemia and GI bleeding. Patient did see [facility] 12/2020 Patient is on Coumadin outpatient, INR supratherapeutic 4.1 in the ER.  Patient states in the last year she has not seen any rectal bleeding, was in normal state of health until yesterday morning. Patient is wheelchair-bound, lives with her wife Stanton Kidney and has 2 nurses aides. Patient normally has bowel movement once daily, yesterday morning nurses aide stated that the stool looked very dark and appeared to have blood in it. Her wife Stanton Kidney  was changing chucks in the bed 7-8 times with blood seen, no stool. Patient denies any sensation of having to have a bowel movement during this time, no abdominal pain. Patient denies dysphagia, reflux, nausea, vomiting. Patient denies any dizziness chest pain, shortness of breath, fever or chills. Patient  first was complaining of dark stools last night into this morning, ER did do a rectal exam on patient had more bright red blood. Patient denies any reflux, changes of Coumadin dosage recently.   Previous GI work up: 12/2020 Colonoscopy Preparation of the colon was fair- consider repeat 5 years Moderate diverticulosis in the left colon. There was evidence of an impacted diverticulum.  Past Medical History:  Diagnosis Date   CHF (congestive heart failure) (HCC)    Heart failure (HCC)    Hypertension    Morbid obesity (HCC)    Multiple sclerosis (HCC)    Paroxysmal atrial fibrillation ([place])     Surgical History:  She  has a past surgical history that includes Colonoscopy with propofol (N/A, 12/15/2020). Family History:  Her family history includes Diabetes in her brother, mother, sister, and sister; HIV/AIDS in her brother; Heart attack in her brother; Hypertension in her mother; Kidney disease in her sister; Stomach cancer in her father. Social History:   reports that she quit smoking about 27 years ago. Her smoking use included cigarettes. She has a 9.00 pack-year smoking history. She has never used smokeless tobacco. She reports that she does not drink alcohol and does not use drugs.  Prior to Admission medications   Medication Sig Start Date End Date Taking? Authorizing Provider  acetaminophen (TYLENOL) 325 MG suppository Place 325 mg rectally every 4 (four) hours as needed for mild pain.   Yes [provider]  isosorbide mononitrate (IMDUR) 60 MG 24 hr tablet Take 60 mg by mouth daily. 01/17/21  Yes [provider]  metoprolol succinate (TOPROL-XL) 50 MG 24 hr tablet Take 50 mg by mouth daily. 01/17/21  Yes [provider]  Multiple Vitamins-Minerals (ONE-A-DAY VITACRAVES ADULT) CHEW Chew 1 tablet by mouth daily.   Yes [provider]  torsemide (DEMADEX) 20 MG tablet Take 40 tablets by mouth daily. May take an additional 20mg  at night as needed for  fluid/swelling 01/17/21  Yes [provider]  traMADol (ULTRAM) 50 MG tablet Take 50 mg by mouth every 8 (eight) hours as needed for moderate pain. 01/17/21  Yes [provider]  warfarin (COUMADIN) 5 MG tablet Take 1 tablet (5 mg total) by mouth daily. Refer to most recent anticoagulation note for most updated dosing instructions. Patient taking differently: Take 2.5-5 mg by mouth See admin instructions. 5 mg every Mon; 2.5 mg all other days 01/18/21  Yes Adrian Prows, MD    Current Facility-Administered Medications  Medication Dose Route Frequency Provider Last Rate Last Admin   0.9 %  sodium chloride infusion  10 mL/hr Intravenous Once Carmin Muskrat, MD       phytonadione (VITAMIN K) tablet 5 mg  5 mg Oral Once Tacy Learn, PA-C       Current Outpatient Medications  Medication Sig Dispense Refill   acetaminophen (TYLENOL) 325 MG suppository Place 325 mg rectally every 4 (four) hours as needed for mild pain.     isosorbide mononitrate (IMDUR) 60 MG 24 hr tablet Take 60 mg by mouth daily.     metoprolol succinate (TOPROL-XL) 50 MG 24 hr tablet Take 50 mg by mouth daily.     Multiple Vitamins-Minerals (ONE-A-DAY  VITACRAVES ADULT) CHEW Chew 1 tablet by mouth daily.     torsemide (DEMADEX) 20 MG tablet Take 40 tablets by mouth daily. May take an additional 20mg  at night as needed for fluid/swelling     traMADol (ULTRAM) 50 MG tablet Take 50 mg by mouth every 8 (eight) hours as needed for moderate pain.     warfarin (COUMADIN) 5 MG tablet Take 1 tablet (5 mg total) by mouth daily. Refer to most recent anticoagulation note for most updated dosing instructions. (Patient taking differently: Take 2.5-5 mg by mouth See admin instructions. 5 mg every Mon; 2.5 mg all other days) 90 tablet 3    Allergies as of 08/25/2021 - Review Complete 08/25/2021  Allergen Reaction Noted   Penicillins Swelling 06/28/2015    Review of Systems:    Constitutional: No weight loss, fever, chills,  weakness or fatigue HEENT: Eyes: No change in vision               Ears, Nose, Throat:  No change in hearing or congestion Skin: No rash or itching Cardiovascular: No chest pain, chest pressure or palpitations   Respiratory: No SOB or cough Gastrointestinal: See HPI and otherwise negative Genitourinary: No dysuria or change in urinary frequency Neurological: No headache, dizziness or syncope Musculoskeletal: No new muscle or joint pain Hematologic: No bleeding or bruising Psychiatric: No history of depression or anxiety     Physical Exam:  Vital signs in last 24 hours: Temp:  [98.2 F (36.8 C)] 98.2 F (36.8 C) (01/19 1016) Pulse Rate:  [135-142] 135 (01/19 1127) Resp:  [15-18] 15 (01/19 1127) BP: (95-107)/(53-92) 107/92 (01/19 1127) SpO2:  [92 %-100 %] 100 % (01/19 1127)    General:   Pleasant, morbidly obese female in no acute distress Head:  Normocephalic and atraumatic. Eyes: sclerae anicteric,conjunctive pale  Heart:  tachy, irregular, no murmurs or gallops, distant heart sounds due to body habitus Pulm: Decreased breath sounds due to body habitus Abdomen: Morbidly obese, soft, nondistended, bowel sounds present, nontender. Extremities:  With edema. Msk:  Symmetrical without gross deformities. Peripheral pulses intact.  Neurologic:  Alert and  oriented x4;  grossly normal neurologically. Skin:   Dry and intact without significant lesions or rashes. Psychiatric: Demonstrates good judgement and reason without abnormal affect or behaviors.  LAB RESULTS: Recent Labs    08/25/21 1043  WBC 8.5  HGB 6.4*  HCT 22.2*  PLT 314   BMET Recent Labs    08/25/21 1043  NA 139  K 4.2  CL 102  CO2 28  GLUCOSE 148*  BUN 20  CREATININE 0.86  CALCIUM 7.8*   LFT Recent Labs    08/25/21 1043  PROT 6.5  ALBUMIN 1.9*  AST 22  ALT 9  ALKPHOS 84  BILITOT 0.5   PT/INR Recent Labs    08/24/21 0000 08/25/21 1043  LABPROT  --  39.4*  INR 1.8* 4.1*    STUDIES: No  results found.   Vladimir Crofts  08/25/2021, 12:34 PM

## 2021-08-25 NOTE — Assessment & Plan Note (Signed)
Soft blood pressures, but on her wrist so unsure of accuracy Hold toprol and imdur today Follow closely with transfusion

## 2021-08-26 ENCOUNTER — Encounter (HOSPITAL_COMMUNITY): Payer: Self-pay | Admitting: Certified Registered Nurse Anesthetist

## 2021-08-26 ENCOUNTER — Encounter (HOSPITAL_COMMUNITY)
Admission: EM | Disposition: A | Discharge status: Home / Self Care | Payer: Self-pay | Source: Home / Self Care | Attending: Internal Medicine

## 2021-08-26 ENCOUNTER — Other Ambulatory Visit: Payer: Self-pay

## 2021-08-26 ENCOUNTER — Inpatient Hospital Stay (HOSPITAL_COMMUNITY): Payer: 59

## 2021-08-26 DIAGNOSIS — I110 Hypertensive heart disease with heart failure: Secondary | ICD-10-CM | POA: Diagnosis present

## 2021-08-26 DIAGNOSIS — D684 Acquired coagulation factor deficiency: Secondary | ICD-10-CM | POA: Diagnosis present

## 2021-08-26 DIAGNOSIS — Z7901 Long term (current) use of anticoagulants: Secondary | ICD-10-CM | POA: Diagnosis not present

## 2021-08-26 DIAGNOSIS — Z833 Family history of diabetes mellitus: Secondary | ICD-10-CM | POA: Diagnosis not present

## 2021-08-26 DIAGNOSIS — B182 Chronic viral hepatitis C: Secondary | ICD-10-CM | POA: Diagnosis present

## 2021-08-26 DIAGNOSIS — R791 Abnormal coagulation profile: Secondary | ICD-10-CM

## 2021-08-26 DIAGNOSIS — I482 Chronic atrial fibrillation, unspecified: Secondary | ICD-10-CM | POA: Diagnosis present

## 2021-08-26 DIAGNOSIS — Z87891 Personal history of nicotine dependence: Secondary | ICD-10-CM | POA: Diagnosis not present

## 2021-08-26 DIAGNOSIS — K922 Gastrointestinal hemorrhage, unspecified: Secondary | ICD-10-CM | POA: Diagnosis present

## 2021-08-26 DIAGNOSIS — K5731 Diverticulosis of large intestine without perforation or abscess with bleeding: Secondary | ICD-10-CM | POA: Diagnosis present

## 2021-08-26 DIAGNOSIS — I959 Hypotension, unspecified: Secondary | ICD-10-CM | POA: Diagnosis not present

## 2021-08-26 DIAGNOSIS — Z880 Allergy status to penicillin: Secondary | ICD-10-CM | POA: Diagnosis not present

## 2021-08-26 DIAGNOSIS — G35 Multiple sclerosis: Secondary | ICD-10-CM | POA: Diagnosis present

## 2021-08-26 DIAGNOSIS — Z841 Family history of disorders of kidney and ureter: Secondary | ICD-10-CM | POA: Diagnosis not present

## 2021-08-26 DIAGNOSIS — D62 Acute posthemorrhagic anemia: Secondary | ICD-10-CM

## 2021-08-26 DIAGNOSIS — Z8249 Family history of ischemic heart disease and other diseases of the circulatory system: Secondary | ICD-10-CM | POA: Diagnosis not present

## 2021-08-26 DIAGNOSIS — K5791 Diverticulosis of intestine, part unspecified, without perforation or abscess with bleeding: Secondary | ICD-10-CM

## 2021-08-26 DIAGNOSIS — Z6841 Body Mass Index (BMI) 40.0 and over, adult: Secondary | ICD-10-CM | POA: Diagnosis not present

## 2021-08-26 DIAGNOSIS — Z20822 Contact with and (suspected) exposure to covid-19: Secondary | ICD-10-CM | POA: Diagnosis present

## 2021-08-26 DIAGNOSIS — I5032 Chronic diastolic (congestive) heart failure: Secondary | ICD-10-CM | POA: Diagnosis present

## 2021-08-26 DIAGNOSIS — Z79899 Other long term (current) drug therapy: Secondary | ICD-10-CM | POA: Diagnosis not present

## 2021-08-26 DIAGNOSIS — Z993 Dependence on wheelchair: Secondary | ICD-10-CM | POA: Diagnosis not present

## 2021-08-26 LAB — BASIC METABOLIC PANEL
Anion gap: 11 (ref 5–15)
BUN: 21 mg/dL (ref 8–23)
CO2: 27 mmol/L (ref 22–32)
Calcium: 7.9 mg/dL — ABNORMAL LOW (ref 8.9–10.3)
Chloride: 101 mmol/L (ref 98–111)
Creatinine, Ser: 0.81 mg/dL (ref 0.44–1.00)
GFR, Estimated: >60 mL/min (ref >60)
Glucose, Bld: 144 mg/dL — ABNORMAL HIGH (ref 70–99)
Potassium: 3.6 mmol/L (ref 3.5–5.1)
Sodium: 139 mmol/L (ref 135–145)

## 2021-08-26 LAB — PROTIME-INR
INR: 3.5 — ABNORMAL HIGH (ref 0.8–1.2)
INR: 1.4 — ABNORMAL HIGH (ref 0.8–1.2)
INR: 1.4 — ABNORMAL HIGH (ref 0.8–1.2)
Prothrombin Time: 34.8 seconds — ABNORMAL HIGH (ref 11.4–15.2)
Prothrombin Time: 17.5 seconds — ABNORMAL HIGH (ref 11.4–15.2)
Prothrombin Time: 17.1 seconds — ABNORMAL HIGH (ref 11.4–15.2)

## 2021-08-26 LAB — CBC
HCT: 23.9 % — ABNORMAL LOW (ref 36.0–46.0)
HCT: 23.1 % — ABNORMAL LOW (ref 36.0–46.0)
Hemoglobin: 7.5 g/dL — ABNORMAL LOW (ref 12.0–15.0)
Hemoglobin: 7.5 g/dL — ABNORMAL LOW (ref 12.0–15.0)
MCH: 22.3 pg — ABNORMAL LOW (ref 26.0–34.0)
MCH: 22.8 pg — ABNORMAL LOW (ref 26.0–34.0)
MCHC: 31.4 g/dL (ref 30.0–36.0)
MCHC: 32.5 g/dL (ref 30.0–36.0)
MCV: 71.1 fL — ABNORMAL LOW (ref 80.0–100.0)
MCV: 70.2 fL — ABNORMAL LOW (ref 80.0–100.0)
Platelets: 261 10*3/uL (ref 150–400)
Platelets: 266 10*3/uL (ref 150–400)
RBC: 3.36 MIL/uL — ABNORMAL LOW (ref 3.87–5.11)
RBC: 3.29 MIL/uL — ABNORMAL LOW (ref 3.87–5.11)
RDW: 21 % — ABNORMAL HIGH (ref 11.5–15.5)
RDW: 21.3 % — ABNORMAL HIGH (ref 11.5–15.5)
WBC: 10.4 10*3/uL (ref 4.0–10.5)
WBC: 10.9 10*3/uL — ABNORMAL HIGH (ref 4.0–10.5)
nRBC: 0 % (ref 0.0–0.2)
nRBC: 0.2 % (ref 0.0–0.2)

## 2021-08-26 LAB — RETICULOCYTES
Immature Retic Fract: 42 % — ABNORMAL HIGH (ref 2.3–15.9)
RBC.: 3.3 MIL/uL — ABNORMAL LOW (ref 3.87–5.11)
Retic Count, Absolute: 70 10*3/uL (ref 19.0–186.0)
Retic Ct Pct: 2.1 % (ref 0.4–3.1)

## 2021-08-26 LAB — PREPARE RBC (CROSSMATCH)

## 2021-08-26 LAB — LACTIC ACID, PLASMA: Lactic Acid, Venous: 1 mmol/L (ref 0.5–1.9)

## 2021-08-26 SURGERY — CANCELLED PROCEDURE

## 2021-08-26 MED ORDER — IOHEXOL 350 MG/ML SOLN
100.0000 mL | Freq: Once | INTRAVENOUS | Status: AC
  Administered 2021-08-26: 100 mL via INTRAVENOUS

## 2021-08-26 MED ORDER — VITAMIN K1 10 MG/ML IJ SOLN
10.0000 mg | INTRAVENOUS | Status: AC
  Administered 2021-08-26: 10 mg via INTRAVENOUS
  Filled 2021-08-26: qty 1

## 2021-08-26 MED ORDER — METOPROLOL TARTRATE 5 MG/5ML IV SOLN
2.5000 mg | Freq: Three times a day (TID) | INTRAVENOUS | Status: DC
  Administered 2021-08-26 – 2021-08-28 (×2): 2.5 mg via INTRAVENOUS
  Filled 2021-08-26 (×3): qty 5

## 2021-08-26 MED ORDER — PROTHROMBIN COMPLEX CONC HUMAN 500 UNITS IV KIT
2089.0000 [IU] | PACK | Status: AC
  Administered 2021-08-26: 2089 [IU] via INTRAVENOUS
  Filled 2021-08-26: qty 2089

## 2021-08-26 MED ORDER — METOPROLOL TARTRATE 12.5 MG HALF TABLET
12.5000 mg | ORAL_TABLET | Freq: Two times a day (BID) | ORAL | Status: DC
  Administered 2021-08-28: 12.5 mg via ORAL
  Filled 2021-08-26 (×2): qty 1

## 2021-08-26 MED ORDER — SODIUM CHLORIDE 0.9 % IV BOLUS
500.0000 mL | Freq: Once | INTRAVENOUS | Status: AC
  Administered 2021-08-26: 500 mL via INTRAVENOUS

## 2021-08-26 SURGICAL SUPPLY — 14 items

## 2021-08-26 NOTE — Progress Notes (Addendum)
Progress Note  Attending physician's note   I have taken a history, reviewed the chart and examined the patient. I performed a substantive portion of this encounter, including complete performance of at least one of the key components, in conjunction with the APP. I agree with the APP's note, impression and recommendations.    Painless hematochezia consistent with acute diverticular hemorrhage She had a recurrent episode earlier this morning, underwent CTA which was negative for active extravasation but the exam was limited due to body habitus  Continue supportive care Monitor H&H and transfuse as needed  Dr. Elnoria Howard will be covering for [facility] this weekend, he will round on the patient.   The patient was provided an opportunity to ask questions and all were answered. The patient agreed with the plan and demonstrated an understanding of the instructions.   Zoe Cobb , MD [phone]     Subjective  Chief Complaint: Anemia, GI bleed   significant event overnight: nurse reporting large-volume hematochezia, tachycardia, soft blood pressure. Patient given IV vitamin K and Kcentra Had CTA abdomen and pelvis which was limited due to body habitus but was negative for active bleeding.  Patient lying in bed, states she has not had any active bleeding since that time. No urge to have bowel movement, with bleeding last night felt just gush. Denies abdominal pain, nausea vomiting.   Patient states she is very hungry and has a bit of a headache from this. Patient's blood pressure in the room 90/60 well with therapy, still tachycardic, patient denies chest pain, shortness of breath, dizziness.    Objective   Vital signs in last 24 hours: Temp:  [97.8 F (36.6 C)-98.6 F (37 C)] 98.6 F (37 C) (01/20 0951) Pulse Rate:  [97-153] 133 (01/20 0951) Resp:  [14-21] 15 (01/20 0951) BP: (90-160)/(61-127) 105/63 (01/20 0951) SpO2:  [94 %-100 %] 100 % (01/20 0951) Weight:  [196.4  kg] 196.4 kg (01/20 0203) Last BM Date: 08/26/21  General:   Pleasant, morbidly obese female in no acute distress Heart:  tachy, irregular, no murmurs or gallops, distant heart sounds due to body habitus Pulm: Decreased breath sounds due to body habitus Abdomen: Morbidly obese, soft, nondistended, bowel sounds present, nontender. Extremities:  With edema. Neurologic:  Alert and  oriented x4;  grossly normal neurologically. Psychiatric: Demonstrates good judgement and reason without abnormal affect or behaviors.  Intake/Output from previous day: 01/19 0701 - 01/20 0700 In: 616 [Blood:366; IV Piggyback:250] Out: -  Intake/Output this shift: No intake/output data recorded.  Lab Results: Recent Labs    08/25/21 1043 08/25/21 1338 08/26/21 0448  WBC 8.5 11.0* 10.4  HGB 6.4* 6.5* 7.5*  HCT 22.2* 21.1* 23.9*  PLT 314 283 261   BMET Recent Labs    08/25/21 1043 08/26/21 0448  NA 139 139  K 4.2 3.6  CL 102 101  CO2 28 27  GLUCOSE 148* 144*  BUN 20 21  CREATININE 0.86 0.81  CALCIUM 7.8* 7.9*   LFT Recent Labs    08/25/21 1043  PROT 6.5  ALBUMIN 1.9*  AST 22  ALT 9  ALKPHOS 84  BILITOT 0.5   PT/INR Recent Labs    08/26/21 0448 08/26/21 0822  LABPROT 34.8* 17.5*  INR 3.5* 1.4*    Studies/Results: VAS [name] LOWER EXTREMITY VENOUS (DVT)  Result Date: 08/25/2021  Lower Venous DVT Study Patient Name:  Zoe Cobb  Date of Exam:   08/25/2021 Medical Rec #: [id number]  Accession #:    [id number] Date of Birth: [birth year]          Patient Gender: F Patient Age:   2 years Exam Location:  [facility] Procedure:      VAS [name] LOWER EXTREMITY VENOUS (DVT) Referring Phys: Orland Mustard --------------------------------------------------------------------------------  Indications: Edema.  Risk Factors: None identified. Limitations: Body habitus, poor ultrasound/tissue interface and patient positioning. Comparison Study: No prior studies. Performing Technologist:  Chanda Busing RVT  Examination Guidelines: A complete evaluation includes B-mode imaging, spectral Doppler, color Doppler, and power Doppler as needed of all accessible portions of each vessel. Bilateral testing is considered an integral part of a complete examination. Limited examinations for reoccurring indications may be performed as noted. The reflux portion of the exam is performed with the patient in reverse Trendelenburg.  +-----+---------------+---------+-----------+----------+--------------+  RIGHT Compressibility Phasicity Spontaneity Properties Thrombus Aging  +-----+---------------+---------+-----------+----------+--------------+  CFV   Full            Yes       Yes                                    +-----+---------------+---------+-----------+----------+--------------+   +---------+---------------+---------+-----------+----------+-------------------+  LEFT      Compressibility Phasicity Spontaneity Properties Thrombus Aging       +---------+---------------+---------+-----------+----------+-------------------+  CFV       Full            Yes       Yes                                         +---------+---------------+---------+-----------+----------+-------------------+  SFJ       Full                                                                  +---------+---------------+---------+-----------+----------+-------------------+  FV Prox   Full                                                                  +---------+---------------+---------+-----------+----------+-------------------+  FV Mid    Full                                                                  +---------+---------------+---------+-----------+----------+-------------------+  FV Distal                 Yes       Yes                                         +---------+---------------+---------+-----------+----------+-------------------+  PFV       Full                                                                   +---------+---------------+---------+-----------+----------+-------------------+  POP       Full            Yes       Yes                                         +---------+---------------+---------+-----------+----------+-------------------+  PTV       Full                                                                  +---------+---------------+---------+-----------+----------+-------------------+  PERO                                                       Not well visualized  +---------+---------------+---------+-----------+----------+-------------------+    Summary: RIGHT: - No evidence of common femoral vein obstruction.  LEFT: - There is no evidence of deep vein thrombosis in the lower extremity. However, portions of this examination were limited- see technologist comments above.  - No cystic structure found in the popliteal fossa.  *See table(s) above for measurements and observations. Electronically signed by Gerarda Fraction on 08/25/2021 at 7:03:49 PM.    Final    CT Angio Abd/Pel w/ and/or w/o  Result Date: 08/26/2021 CLINICAL DATA:  Lower GI bleed. Continuous bloody diarrhea for 3 days. EXAM: CTA ABDOMEN AND PELVIS WITHOUT AND WITH CONTRAST TECHNIQUE: Multidetector CT imaging of the abdomen and pelvis was performed using the standard protocol during bolus administration of intravenous contrast. Multiplanar reconstructed images and MIPs were obtained and reviewed to evaluate the vascular anatomy. RADIATION DOSE REDUCTION: This exam was performed according to the departmental dose-optimization program which includes automated exposure control, adjustment of the mA and/or kV according to patient size and/or use of iterative reconstruction technique. CONTRAST:  OMNIPAQUE IOHEXOL 350 MG/ML SOLN COMPARISON:  None. FINDINGS: VASCULAR Aorta: Normal caliber aorta without aneurysm, dissection, vasculitis or significant stenosis. Celiac: No signal abnormality. SMA: No significant abnormality. Renals: Not  well evaluated due to suboptimal opacification. IMA: Patent without evidence of aneurysm, dissection, vasculitis or significant stenosis. Inflow: Patent without evidence of aneurysm, dissection, vasculitis or significant stenosis. Proximal Outflow: Bilateral common femoral and visualized portions of the superficial and profunda femoral arteries are patent without evidence of aneurysm, dissection, vasculitis or significant stenosis. Veins: No obvious venous abnormality is identified. Evaluation is limited due to suboptimal opacification, even on the portal venous phase. Review of the MIP images confirms the above findings. NON-VASCULAR Lower chest: No acute abnormality. Hepatobiliary: Cholelithiasis. No significant abnormality of the liver or bile ducts. Pancreas: Unremarkable. No pancreatic ductal dilatation or surrounding inflammatory changes. Spleen: Normal in size without focal abnormality. Adrenals/Urinary Tract: Adrenal glands are unremarkable. 2.5 cm simple cyst present in the upper pole of the right kidney. 1.3 cm simple cyst seen in the mid right kidney. 1.3 cm simple left renal cyst. Kidneys, ureters, and bladder are otherwise unremarkable. Stomach/Bowel: No bowel dilatation to indicate ileus or obstruction. Evaluation for acute hemorrhage is significantly limited due to streak artifact created from patient's body habitus. Sigmoid colon diverticulosis  without evidence of acute diverticulitis. Lymphatic: Mildly prominent he left inguinal lymph node is seen measuring 1.2 cm in short axis, is most likely inflammatory. Otherwise no enlarged abdominal or pelvic lymph nodes. Reproductive: Uterus and bilateral adnexa are unremarkable. Other: Mild diastasis of the rectus sheath. Musculoskeletal: Grade 1-2 anterolisthesis of L5 on S1. no acute osseous abnormality identified. IMPRESSION: VASCULAR No active gastrointestinal hemorrhage identified. However, please note that this exam is limited for hemorrhage due to  extensive streak artifact created by patient's body habitus. NON-VASCULAR Sigmoid colon diverticulosis without evidence of acute diverticulitis. Electronically Signed   By: Acquanetta Belling M.D.   On: 08/26/2021 10:35      Impression/Plan:   66 year old very pleasant female with morbid obesity, chronic A. fib on Coumadin presented after recurrent episodes of hematochezia and supratherapeutic INR 4.1 colonoscopy for GI bleeding in 12/2020- thought to be diverticular  GI bleed, mild tachycardia but otherwise hemodynamically stable Baseline HGB 9, ER 6.4 s/p 3 PRBC currently at 7.5 after episode last night of large volume painless hematochezia.  CTA today performed, negative for active bleed but limited due to patient's body habitus, sigmoid diverticulosis noted.  We will put patient back on liquid diet. Continue to trend INR, patient given vitamin K yesterday Today to hold Coumadin for time being, repeat INR tomorrow Continue supportive care transfuse to hemoglobin above 7. If another episode of acute lower GI bleeding, can proceed with red blood cell scan, with consult IR if positive Still no plan for endoscopic evaluation at this time, all questions answered.    Acute on chronic anemia Hemoglobin in the ER found to be 6.4 s/p 3 units PRBC, now at 7.5  Baseline around 9 Microcytosis MCV 70 12/12/2020 iron 38, ferritin 59, folate 27, B12 300,    Morbid obesity   Atrial fibrillation  on Coumadin with supratherapeutic INR 4.1 in the ER Likely contributing to GI bleed, goal to get INR around 2 or less.   Congestive heart failure Echo 08/2016 grade 2 DD     LOS: 0 days   Doree Albee  08/26/2021, 1:15 PM

## 2021-08-26 NOTE — Progress Notes (Signed)
°   08/26/21 0203  Assess: MEWS Score  Temp 98 F (36.7 C)  BP 98/69  Pulse Rate (!) 146  ECG Heart Rate (!) 147  Resp 16  Level of Consciousness Alert  SpO2 97 %  O2 Device Room Air  Assess: MEWS Score  MEWS Temp 0  MEWS Systolic 1  MEWS Pulse 3  MEWS RR 0  MEWS LOC 0  MEWS Score 4  MEWS Score Color Red  Assess: if the MEWS score is Yellow or Red  Were vital signs taken at a resting state? Yes  Focused Assessment Change from prior assessment (see assessment flowsheet)  Early Detection of Sepsis Score *See Row Information* Low  MEWS guidelines implemented *See Row Information* Yes  Treat  Pain Scale 0-10  Pain Score 0  Patients Stated Pain Goal 0  Take Vital Signs  Increase Vital Sign Frequency  Red: Q 1hr X 4 then Q 4hr X 4, if remains red, continue Q 4hrs  Escalate  MEWS: Escalate Red: discuss with charge nurse/RN and provider, consider discussing with RRT  Notify: Charge Nurse/RN  Name of Charge Nurse/RN Notified Deborah, RN  Date Charge Nurse/RN Notified 08/26/21  Time Charge Nurse/RN Notified 0210  Notify: Provider  Provider Name/Title Rathore, MD  Date Provider Notified 08/26/21  Time Provider Notified [phone]  Notification Type Page  Notification Reason Change in status  Provider response See new orders  Date of Provider Response 08/26/21  Time of Provider Response 0330  Document  Patient Outcome Not stable and remains on department  Progress note created (see row info) Yes

## 2021-08-26 NOTE — Progress Notes (Signed)
CARDIOLOGY CONSULT NOTE  Patient ID: Zoe Cobb MRN: [id number] DOB/AGE: [birth year] 66 y.o.  Admit date: 08/25/2021 Attending physician: Calvert Cantor, MD Primary Physician:  Marletta Lor, NP Outpatient Cardiologist: Dr. Jacinto Halim Inpatient Cardiologist: Tessa Lerner, DO, [facility]  Reason of consultation: Tachycardia, history of atrial fibrillation, GI bleed Referring physician: Dr. Orland Mustard  Chief complaint: Maroon stools  HPI:  Zoe Cobb is a 66 y.o. African-American female who presents with a chief complaint of " maroon stools." Her past medical history and cardiovascular risk factors include: Paroxysmal atrial fibrillation on Coumadin, HFpEF, hypertension, morbid obesity (BMI 81.8) chronic hepatitis C, history of GI bleed.  Her caregiver was cleaning her up and noticed dark clots in her brief.  Patient states that they were maroon in color and present irrespective of her bowel movements.  She presented to ED for evaluation of GI bleed as she is on oral anticoagulation for atrial fibrillation.  On presentation her hemoglobin was 6.4 g/dL.  She has received 2 units of PRBC 08/25/2021 and an additional PRBC today 08/26/2021.  And her PT/INR was supratherapeutic yesterday (INR 4.1) she is also received vitamin K and Kcentra.  Cardiology has been consulted during this hospitalization as she is tachycardic on telemetry and hypotensive.  At the time of evaluation patient denies any chest pain at rest.  Unable to comment on effort related symptoms and due to morbid obesity and immobility.  Complains of shortness of breath but denies orthopnea, paroxysmal nocturnal dyspnea lower extremity swelling.  She denies any syncopal events. Since yesterday night patient does experience palpitations.    She scheduled for CT of the abdomen to evaluate for bleeding.   ALLERGIES: Allergies  Allergen Reactions   Penicillins Swelling    Has patient had a PCN reaction causing immediate rash,  facial/tongue/throat swelling, SOB or lightheadedness with hypotension: Yes Has patient had a PCN reaction causing severe rash involving mucus membranes or skin necrosis: No Has patient had a PCN reaction that required hospitalization No Has patient had a PCN reaction occurring within the last 10 years: No If all of the above answers are "NO", then may proceed with Cephalosporin use.     PAST MEDICAL HISTORY: Past Medical History:  Diagnosis Date   CHF (congestive heart failure) (HCC)    Heart failure (HCC)    Hypertension    Morbid obesity (HCC)    Multiple sclerosis (HCC)    Paroxysmal atrial fibrillation (HCC)     PAST SURGICAL HISTORY: Past Surgical History:  Procedure Laterality Date   COLONOSCOPY WITH PROPOFOL N/A 12/15/2020   Procedure: COLONOSCOPY WITH PROPOFOL;  Surgeon: Meryl Dare, MD;  Location: [facility] ENDOSCOPY;  Service: Endoscopy;  Laterality: N/A;    FAMILY HISTORY: The patient's family history includes Diabetes in her brother, mother, sister, and sister; HIV/AIDS in her brother; Heart attack in her brother; Hypertension in her mother; Kidney disease in her sister; Stomach cancer in her father.   SOCIAL HISTORY:  The patient  reports that she quit smoking about 27 years ago. Her smoking use included cigarettes. She has a 9.00 pack-year smoking history. She has never used smokeless tobacco. She reports that she does not drink alcohol and does not use drugs.  MEDICATIONS: Current Outpatient Medications  Medication Instructions   acetaminophen (TYLENOL) 325 mg, Rectal, Every 4 hours PRN   isosorbide mononitrate (IMDUR) 60 mg, Oral, Daily   metoprolol succinate (TOPROL-XL) 50 mg, Oral, Daily   Multiple Vitamins-Minerals (ONE-A-DAY VITACRAVES ADULT) CHEW 1 tablet, Oral, Daily  torsemide (DEMADEX) 20 MG tablet 40 tablets, Oral, Daily, May take an additional 20mg  at night as needed for fluid/swelling   traMADol (ULTRAM) 50 mg, Oral, Every 8 hours PRN   warfarin  (COUMADIN) 5 mg, Oral, Daily, Refer to most recent anticoagulation note for most updated dosing instructions.    REVIEW OF SYSTEMS: Review of Systems  Constitutional: Negative for chills and fever.  HENT:  Negative for hoarse voice and nosebleeds.   Eyes:  Negative for discharge, double vision and pain.  Cardiovascular:  Positive for palpitations. Negative for chest pain, claudication, dyspnea on exertion, leg swelling, near-syncope, orthopnea, paroxysmal nocturnal dyspnea and syncope.  Respiratory:  Negative for hemoptysis and shortness of breath.   Musculoskeletal:  Negative for muscle cramps and myalgias.  Gastrointestinal:  Positive for hematochezia and melena (more marron colored stool). Negative for abdominal pain, constipation, diarrhea, hematemesis, nausea and vomiting.  Neurological:  Negative for dizziness and light-headedness.  All other systems reviewed and are negative.  PHYSICAL EXAM: Vitals with BMI 08/26/2021 08/26/2021 08/26/2021  Height - - -  Weight - - -  BMI - - -  Systolic 91 - 105  Diastolic 58 - 63  Pulse 122 125 133     Intake/Output Summary (Last 24 hours) at 08/26/2021 1839 Last data filed at 08/26/2021 1614 Gross per 24 hour  Intake 310.67 ml  Output --  Net 310.67 ml    Net IO Since Admission: 926.67 mL [08/26/21 1839]  CONSTITUTIONAL: Appears older than stated age, morbidly obese, No acute distress.  SKIN: Skin is warm and dry. No rash noted. No cyanosis. No pallor. No jaundice HEAD: Normocephalic and atraumatic.  EYES: No scleral icterus MOUTH/THROAT: Moist oral membranes.  NECK: Unable to evaluate for JVP due to adipose tissue. No thyromegaly noted. No carotid bruits  LYMPHATIC: No visible cervical adenopathy.  CHEST Normal respiratory effort. No intercostal retractions  LUNGS: Anteriorly clear to auscultation.  No stridor. No wheezes. No rales.  CARDIOVASCULAR: Tachycardia, regular, positive S1-S2, no murmurs rubs or gallops appreciated  secondary to tachycardia ABDOMINAL: Morbidly obese, soft, nontender, nondistended, positive bowel sounds in all 4 quadrants, no apparent ascites.  EXTREMITIES: No pitting edema, warm to touch, difficult to appreciate peripheral pulses. HEMATOLOGIC: No significant bruising NEUROLOGIC: Oriented to person, place, and time. Nonfocal. Normal muscle tone.  PSYCHIATRIC: Normal mood and affect. Normal behavior. Cooperative  RADIOLOGY: VAS [name] LOWER EXTREMITY VENOUS (DVT)  Result Date: 08/25/2021  Lower Venous DVT Study Patient Name:  Zoe Cobb  Date of Exam:   08/25/2021 Medical Rec #: [id number]         Accession #:    [id number] Date of Birth: 11/21/1955          Patient Gender: F Patient Age:   41 years Exam Location:  [facility] Procedure:      VAS [name] LOWER EXTREMITY VENOUS (DVT) Referring Phys: Orland Mustard --------------------------------------------------------------------------------  Indications: Edema.  Risk Factors: None identified. Limitations: Body habitus, poor ultrasound/tissue interface and patient positioning. Comparison Study: No prior studies. Performing Technologist: Chanda Busing RVT  Examination Guidelines: A complete evaluation includes B-mode imaging, spectral Doppler, color Doppler, and power Doppler as needed of all accessible portions of each vessel. Bilateral testing is considered an integral part of a complete examination. Limited examinations for reoccurring indications may be performed as noted. The reflux portion of the exam is performed with the patient in reverse Trendelenburg.  +-----+---------------+---------+-----------+----------+--------------+  RIGHT Compressibility Phasicity Spontaneity Properties Thrombus Aging  +-----+---------------+---------+-----------+----------+--------------+  CFV  Full            Yes       Yes                                    +-----+---------------+---------+-----------+----------+--------------+    +---------+---------------+---------+-----------+----------+-------------------+  LEFT      Compressibility Phasicity Spontaneity Properties Thrombus Aging       +---------+---------------+---------+-----------+----------+-------------------+  CFV       Full            Yes       Yes                                         +---------+---------------+---------+-----------+----------+-------------------+  SFJ       Full                                                                  +---------+---------------+---------+-----------+----------+-------------------+  FV Prox   Full                                                                  +---------+---------------+---------+-----------+----------+-------------------+  FV Mid    Full                                                                  +---------+---------------+---------+-----------+----------+-------------------+  FV Distal                 Yes       Yes                                         +---------+---------------+---------+-----------+----------+-------------------+  PFV       Full                                                                  +---------+---------------+---------+-----------+----------+-------------------+  POP       Full            Yes       Yes                                         +---------+---------------+---------+-----------+----------+-------------------+  PTV       Full                                                                  +---------+---------------+---------+-----------+----------+-------------------+  PERO                                                       Not well visualized  +---------+---------------+---------+-----------+----------+-------------------+    Summary: RIGHT: - No evidence of common femoral vein obstruction.  LEFT: - There is no evidence of deep vein thrombosis in the lower extremity. However, portions of this examination were limited- see technologist comments above.  - No cystic  structure found in the popliteal fossa.  *See table(s) above for measurements and observations. Electronically signed by Orlie Pollen on 08/25/2021 at 7:03:49 PM.    Final    CT Angio Abd/Pel w/ and/or w/o  Result Date: 08/26/2021 CLINICAL DATA:  Lower GI bleed. Continuous bloody diarrhea for 3 days. EXAM: CTA ABDOMEN AND PELVIS WITHOUT AND WITH CONTRAST TECHNIQUE: Multidetector CT imaging of the abdomen and pelvis was performed using the standard protocol during bolus administration of intravenous contrast. Multiplanar reconstructed images and MIPs were obtained and reviewed to evaluate the vascular anatomy. RADIATION DOSE REDUCTION: This exam was performed according to the departmental dose-optimization program which includes automated exposure control, adjustment of the mA and/or kV according to patient size and/or use of iterative reconstruction technique. CONTRAST:  150mL OMNIPAQUE IOHEXOL 350 MG/ML SOLN COMPARISON:  None. FINDINGS: VASCULAR Aorta: Normal caliber aorta without aneurysm, dissection, vasculitis or significant stenosis. Celiac: No signal abnormality. SMA: No significant abnormality. Renals: Not well evaluated due to suboptimal opacification. IMA: Patent without evidence of aneurysm, dissection, vasculitis or significant stenosis. Inflow: Patent without evidence of aneurysm, dissection, vasculitis or significant stenosis. Proximal Outflow: Bilateral common femoral and visualized portions of the superficial and profunda femoral arteries are patent without evidence of aneurysm, dissection, vasculitis or significant stenosis. Veins: No obvious venous abnormality is identified. Evaluation is limited due to suboptimal opacification, even on the portal venous phase. Review of the MIP images confirms the above findings. NON-VASCULAR Lower chest: No acute abnormality. Hepatobiliary: Cholelithiasis. No significant abnormality of the liver or bile ducts. Pancreas: Unremarkable. No pancreatic ductal  dilatation or surrounding inflammatory changes. Spleen: Normal in size without focal abnormality. Adrenals/Urinary Tract: Adrenal glands are unremarkable. 2.5 cm simple cyst present in the upper pole of the right kidney. 1.3 cm simple cyst seen in the mid right kidney. 1.3 cm simple left renal cyst. Kidneys, ureters, and bladder are otherwise unremarkable. Stomach/Bowel: No bowel dilatation to indicate ileus or obstruction. Evaluation for acute hemorrhage is significantly limited due to streak artifact created from patient's body habitus. Sigmoid colon diverticulosis without evidence of acute diverticulitis. Lymphatic: Mildly prominent he left inguinal lymph node is seen measuring 1.2 cm in short axis, is most likely inflammatory. Otherwise no enlarged abdominal or pelvic lymph nodes. Reproductive: Uterus and bilateral adnexa are unremarkable. Other: Mild diastasis of the rectus sheath. Musculoskeletal: Grade 1-2 anterolisthesis of L5 on S1. no acute osseous abnormality identified. IMPRESSION: VASCULAR No active gastrointestinal hemorrhage identified. However, please note that this exam is limited for hemorrhage due to extensive streak artifact created by patient's body habitus. NON-VASCULAR Sigmoid colon diverticulosis without evidence of acute diverticulitis. Electronically Signed   By: Miachel Roux M.D.   On: 08/26/2021 10:35    LABORATORY DATA: Lab Results  Component Value Date   WBC 10.4 08/26/2021   HGB 7.5 (L) 08/26/2021   HCT 23.9 (L) 08/26/2021   MCV 71.1 (L)  08/26/2021   PLT 261 08/26/2021    Recent Labs  Lab 08/25/21 1043 08/26/21 0448  NA 139 139  K 4.2 3.6  CL 102 101  CO2 28 27  BUN 20 21  CREATININE 0.86 0.81  CALCIUM 7.8* 7.9*  PROT 6.5  --   BILITOT 0.5  --   ALKPHOS 84  --   ALT 9  --   AST 22  --   GLUCOSE 148* 144*    Lipid Panel  Lab Results  Component Value Date   CHOL 139 08/28/2016   HDL 58 08/28/2016   LDLCALC 70 08/28/2016   TRIG 53 08/28/2016   CHOLHDL  2.4 08/28/2016    BNP (last 3 results) No results for input(s): BNP in the last 8760 hours.  HEMOGLOBIN A1C Lab Results  Component Value Date   HGBA1C 5.5 08/28/2016   MPG 111 08/28/2016    Cardiac Panel (last 3 results) No results for input(s): CKTOTAL, CKMB, TROPONINIHS, RELINDX in the last 72 hours.   TSH Recent Labs    08/25/21 1337  TSH 3.223     CARDIAC DATABASE: EKG: 08/26/2021: Sinus tachycardia, 118 bpm, without underlying injury pattern.  Echocardiogram: [09/2016]: Left ventricle: The cavity size was mildly dilated. Wall thickness was increased in a pattern of mild LVH. Systolic function was normal. The estimated ejection fraction was in the range of 55% to 60%. Wall motion was normal; there were no regional wall motion abnormalities. Features are consistent with a pseudonormal left ventricular filling pattern, with concomitant abnormal relaxation and increased filling pressure (grade 2 diastolic dysfunction). - Mitral valve: Calcified annulus. - Left atrium: The atrium was moderately dilated. - Right atrium: The atrium was mildly dilated. - Pulmonary arteries: Systolic pressure was mildly increased. PA peak pressure: 42 mm Hg (S).    IMPRESSION & RECOMMENDATIONS: Zoe Cobb is a 66 y.o. African-American female whose past medical history and cardiovascular risk factors include: Paroxysmal atrial fibrillation on Coumadin, HFpEF, hypertension, morbid obesity (BMI 81.8) chronic hepatitis C, history of GI bleed.  Impression:  Tachycardia Hypotension Acute GI bleed Supratherapeutic INR-on arrival Paroxysmal atrial fibrillation Long-term oral anticoagulation Chronic HFpEF Morbid obesity (Body mass index is 81.81 kg/m.) Chronic hepatitis C History of GI bleed  Plan:  Cardiology has been consulted during this hospitalization due to acute GI bleed and supratherapeutic INR.  Patient is status post PRBCs, vitamin K, Kcentra.  Gastroenterology has been  consulted, patient is scheduled for CT of the abdomen to evaluate for source of bleed.  Will defer further recommendations to their service.  Agree with holding anticoagulation until cleared by GI service.  Continue to follow H&H, PT/INR.  Her tachycardia and hypertension are compensatory mechanism given her acute GI bleed.  Recommend transfusion of blood products as needed to keep hemoglobin greater than 7 g/dL as GI recommends.  May also consider IV fluids to help support her blood pressures.  Currently on metoprolol tartrate with holding parameters.    Will put in orders for IV Lopressor 2.5 mg every 8 hours to give only if HR greater than [id number] and systolic blood pressure greater than 164mmHg.   If needed could also do consider digoxin with close monitoring of renal function and electrolytes.  Continue telemetry.  If patient continues to be hypertensive despite blood transfusions and IV fluids consider ICU consultation for closer observation and management.  We will follow the patient with you during this hospitalization.  Total encounter time 62 minutes. *Total Encounter Time as defined by  the Centers for Medicare and Medicaid Services includes, in addition to the face-to-face time of a patient visit (documented in the note above) non-face-to-face time: obtaining and reviewing outside history, ordering and reviewing medications, tests or procedures, care coordination (communications with other health care professionals or caregivers) and documentation in the medical record.  Patient's questions and concerns were addressed to her satisfaction. She voices understanding of the instructions provided during this encounter.   This note was created using a voice recognition software as a result there may be grammatical errors inadvertently enclosed that do not reflect the nature of this encounter. Every attempt is made to correct such errors.  Mechele Claude [facility]  Pager:  [phone] Office: [phone] 08/26/2021, 6:39 PM

## 2021-08-26 NOTE — Progress Notes (Addendum)
Overnight progress note  Informed by RN that patient is tachycardic with heart rate in the 130s to 140s, sinus rhythm.  Blood pressure 99/73.  RN noticed blood clots when cleaning the patient but not actively bleeding.  She has received 3 units PRBCs and 250 cc IV fluid bolus since the time of admission.  -Stat EKG ordered -500 cc fluid bolus ordered.  Does have history of diastolic CHF but no respiratory distress or hypoxia. -Avoid beta-blocker/calcium channel blocker at this time given low blood pressure -Repeat H&H  Addendum 5 AM: Informed by RN that patient had an episode of hematochezia, large amount.  Remains tachycardic with heart rate in the low 100s and blood pressure 100/74. -Stat repeat H&H pending, stat repeat PT/INR -Stat CTA abdomen pelvis -[name] paged, awaiting callback.  Addendum 5:24 AM: GI paged again, awaiting callback.  Addendum 5:48 AM: Repeat labs showing hemoglobin 7.5 and INR 3.5. Spoke to Dr. Lyndel Safe, he agrees with CTA abdomen pelvis and if positive for active bleeding then IR consultation for embolization.  He recommends giving IV vitamin K and Kcentra.  Discussed with pharmacy.

## 2021-08-26 NOTE — Consult Note (Signed)
NAME:  Zoe Cobb, MRN:  [id number], DOB:  1955/09/02, LOS: 0 ADMISSION DATE:  08/25/2021, CONSULTATION DATE:  08/26/21 REFERRING MD:  Butler Denmark, CHIEF COMPLAINT:  melena   History of Present Illness:  65yF wheelchair bound, pAF on warfarin, chronic diastolic heart failure, HTN, morbid obesity, diverticulosis and possible prior diverticular bleeding, chronic HCV who was admitted 08/25/21 for GI bleeding which began 1d PTA when she was passing dark stool/clots.   Had c-scope during admission 12/2020 for LGIB which revealed diverticulosis without active bleeding. She was transfused 2U pRBC on 1/19 and 1U pRBC on 1/20, PCC this morning, 10 of IV vit K, 750cc saline.  Her metoprolol has been held. CTA today without extrav but limited by habitus.   We were consulted this evening in setting of hypotension.     Pertinent  Medical History  pAF on warfarin  Significant Hospital Events: Including procedures, antibiotic start and stop dates in addition to other pertinent events   1/19 admitted, 2u PRBC 1/20 1U pRBC, 10 IV vit K, PCC  Interim History / Subjective:    Objective   Blood pressure (!) 91/58, pulse (!) 122, temperature 98 F (36.7 C), temperature source Oral, resp. rate 18, weight (!) 196.4 kg, SpO2 100 %.        Intake/Output Summary (Last 24 hours) at 08/26/2021 1637 Last data filed at 08/26/2021 1614 Gross per 24 hour  Intake 676.67 ml  Output --  Net 676.67 ml   Filed Weights   08/26/21 0203  Weight: (!) 196.4 kg    Examination: General appearance: 66 y.o., female, NAD, conversant  Eyes: PERRL, tracking appropriately HENT: NCAT; MMM, poor dentition Lungs: distant breath sounds, no crackles, no wheeze, with normal respiratory effort CV: tachy, IRIR, no murmur  Abdomen: Soft, non-tender; non-distended, obese, BS present  Extremities: trace peripheral edema, warm Skin: Normal turgor and texture; no rash Psych: Appropriate affect Neuro: Alert and oriented to person and  place, no focal deficit    Resolved Hospital Problem list     Assessment & Plan:  # Acute blood loss anemia  # Suspected diverticular hemorrhage Has history of ?diverticular bleeding. Having trouble achieving a reliable cuff pressure.  - transfuse for Hb 7 or for ongoing bleeding with hemodynamic change - supportive care - if significant rebleeding would discuss with IR and GI given limitations of CTA earlier today - hold warfarin - check lactic. If significant lactic elevation or mental status change along with hypotension then we will transfer to ICU.   # pAF - agree with change to metoprolol 12.5 BID - hold warfarin as above   # Chronic diastolic heart failure - holding diuretic in setting GIB  Best Practice (right click and "Reselect all SmartList Selections" daily)   Per [facility]  Labs   CBC: Recent Labs  Lab 08/25/21 1043 08/25/21 1338 08/26/21 0448  WBC 8.5 11.0* 10.4  NEUTROABS 6.1  --   --   HGB 6.4* 6.5* 7.5*  HCT 22.2* 21.1* 23.9*  MCV 70.0* 70.3* 71.1*  PLT 314 283 261    Basic Metabolic Panel: Recent Labs  Lab 08/25/21 1043 08/26/21 0448  NA 139 139  K 4.2 3.6  CL 102 101  CO2 28 27  GLUCOSE 148* 144*  BUN 20 21  CREATININE 0.86 0.81  CALCIUM 7.8* 7.9*   GFR: CrCl cannot be calculated (Unknown ideal weight.). Recent Labs  Lab 08/25/21 1043 08/25/21 1338 08/26/21 0448  WBC 8.5 11.0* 10.4    Liver Function  Tests: Recent Labs  Lab 08/25/21 1043  AST 22  ALT 9  ALKPHOS 84  BILITOT 0.5  PROT 6.5  ALBUMIN 1.9*   No results for input(s): LIPASE, AMYLASE in the last 168 hours. No results for input(s): AMMONIA in the last 168 hours.  ABG    Component Value Date/Time   PHART 7.415 10/12/2016 1215   PCO2ART 48.4 (H) 10/12/2016 1215   PO2ART 69.0 (L) 10/12/2016 1215   HCO3 31.0 (H) 10/12/2016 1215   TCO2 32 10/12/2016 1215   O2SAT 94.0 10/12/2016 1215     Coagulation Profile: Recent Labs  Lab 08/24/21 0000 08/25/21 1043  08/26/21 0448 08/26/21 0822  INR 1.8* 4.1* 3.5* 1.4*    Cardiac Enzymes: No results for input(s): CKTOTAL, CKMB, CKMBINDEX, TROPONINI in the last 168 hours.  HbA1C: Hgb A1c MFr Bld  Date/Time Value Ref Range Status  08/28/2016 02:01 PM 5.5 4.8 - 5.6 % Final    Comment:    (NOTE)         Pre-diabetes: 5.7 - 6.4         Diabetes: >6.4         Glycemic control for adults with diabetes: <7.0     CBG: No results for input(s): GLUCAP in the last 168 hours.  Review of Systems:   12 point review of systems is negative except as in HPI  Past Medical History:  She,  has a past medical history of CHF (congestive heart failure) ([place]), Heart failure ([place]), Hypertension, Morbid obesity ([place]), Multiple sclerosis ([place]), and Paroxysmal atrial fibrillation ([place]).   Surgical History:   Past Surgical History:  Procedure Laterality Date   COLONOSCOPY WITH PROPOFOL N/A 12/15/2020   Procedure: COLONOSCOPY WITH PROPOFOL;  Surgeon: Ladene Artist, MD;  Location: [facility];  Service: Endoscopy;  Laterality: N/A;     Social History:   reports that she quit smoking about 27 years ago. Her smoking use included cigarettes. She has a 9.00 pack-year smoking history. She has never used smokeless tobacco. She reports that she does not drink alcohol and does not use drugs.   Family History:  Her family history includes Diabetes in her brother, mother, sister, and sister; HIV/AIDS in her brother; Heart attack in her brother; Hypertension in her mother; Kidney disease in her sister; Stomach cancer in her father.   Allergies Allergies  Allergen Reactions   Penicillins Swelling    Has patient had a PCN reaction causing immediate rash, facial/tongue/throat swelling, SOB or lightheadedness with hypotension: Yes Has patient had a PCN reaction causing severe rash involving mucus membranes or skin necrosis: No Has patient had a PCN reaction that required hospitalization No Has patient had a PCN reaction  occurring within the last 10 years: No If all of the above answers are "NO", then may proceed with Cephalosporin use.      Home Medications  Prior to Admission medications   Medication Sig Start Date End Date Taking? Authorizing Provider  acetaminophen (TYLENOL) 325 MG suppository Place 325 mg rectally every 4 (four) hours as needed for mild pain.   Yes [provider]  isosorbide mononitrate (IMDUR) 60 MG 24 hr tablet Take 60 mg by mouth daily. 01/17/21  Yes [provider]  metoprolol succinate (TOPROL-XL) 50 MG 24 hr tablet Take 50 mg by mouth daily. 01/17/21  Yes [provider]  Multiple Vitamins-Minerals (ONE-A-DAY VITACRAVES ADULT) CHEW Chew 1 tablet by mouth daily.   Yes [provider]  torsemide (DEMADEX) 20 MG tablet  Take 40 tablets by mouth daily. May take an additional 20mg  at night as needed for fluid/swelling 01/17/21  Yes [provider]  traMADol (ULTRAM) 50 MG tablet Take 50 mg by mouth every 8 (eight) hours as needed for moderate pain. 01/17/21  Yes [provider]  warfarin (COUMADIN) 5 MG tablet Take 1 tablet (5 mg total) by mouth daily. Refer to most recent anticoagulation note for most updated dosing instructions. Patient taking differently: Take 2.5-5 mg by mouth See admin instructions. 5 mg every Mon; 2.5 mg all other days 01/18/21  Yes Adrian Prows, MD

## 2021-08-26 NOTE — TOC Initial Note (Signed)
Transition of Care [facility]) - Initial/Assessment Note    Patient Details  Name: Zoe Cobb MRN: [id number] Date of Birth: [birth year]  Transition of Care [facility]) CM/SW Contact:    Bethena Roys, RN Phone Number: 08/26/2021, 1:05 PM  Clinical Narrative: Case Manager spoke with patient regarding home health needs. Prior to arrival patient was from home with spouse. Plan will be to return home with spouse providing twenty four hour supervision. Patient states she lives in an independent living facility apartment [facility]). Patient states she has durable medical equipment (DME) wheelchair, hoyer lift, and bariatric hospital bed in the home. Patient reports that she has ADL care via [facility] 7 days a week. Monday -Friday for three hours and the weekend for three hours in the morning and two hours in the evening. Case Manager asked MD for PT/OT orders for additional recommendations. Patient will need ambulance transportation home once stable. Case Manager will continue to follow for additional needs.                Expected Discharge Plan: [place] Barriers to Discharge: Continued Medical Work up   Patient Goals and CMS Choice Patient states their goals for this hospitalization and ongoing recovery are:: to return home with spouse (wife)      Expected Discharge Plan and Services Expected Discharge Plan: [place] In-house Referral: NA Discharge Planning Services: CM Consult Post Acute Care Choice: NA Living arrangements for the past 2 months: [place] ([place]                   DME Agency: NA       HH Arranged: NA          Prior Living Arrangements/Services Living arrangements for the past 2 months: [place] (Apartment-) Lives with:: Spouse Patient language and need for interpreter reviewed:: Yes Do you feel safe going back to the place where you live?:  Yes      Need for Family Participation in Patient Care: Yes (Comment) Care giver support system in place?: Yes (comment) Current home services: DME (Patient has Wheelchair, hoyer lift, hospital bed.) Criminal Activity/Legal Involvement Pertinent to Current Situation/Hospitalization: No - Comment as needed  Activities of Daily Living Home Assistive Devices/Equipment: [employer] fast streamer, Wheelchair, Hospital bed ADL Screening (condition at time of admission) Patient's cognitive ability adequate to safely complete daily activities?: Yes Is the patient deaf or have difficulty hearing?: No Does the patient have difficulty seeing, even when wearing glasses/contacts?: No Does the patient have difficulty concentrating, remembering, or making decisions?: No Patient able to express need for assistance with ADLs?: Yes Does the patient have difficulty dressing or bathing?: Yes Independently performs ADLs?: No Communication: Independent Dressing (OT): Dependent Is this a change from baseline?: Pre-admission baseline Grooming: Dependent Is this a change from baseline?: Pre-admission baseline Feeding: Independent Bathing: Dependent Is this a change from baseline?: Pre-admission baseline Toileting: Dependent Is this a change from baseline?: Pre-admission baseline In/Out Bed: Dependent Is this a change from baseline?: Pre-admission baseline Walks in Home: Dependent Is this a change from baseline?: Pre-admission baseline Does the patient have difficulty walking or climbing stairs?: Yes Weakness of Legs: Both Weakness of Arms/Hands: Both  Permission Sought/Granted Permission sought to share information with : Family Supports, Customer service manager, Case Manager                Emotional Assessment Appearance:: Appears stated age Attitude/Demeanor/Rapport: Engaged Affect (typically  observed): Appropriate Orientation: : Oriented to Self, Oriented to Place, Oriented to  Time, Oriented to  Situation Alcohol / Substance Use: Not Applicable Psych Involvement: No (comment)  Admission diagnosis:  Acute GI bleeding [K92.2] Lower GI bleed [K92.2] Elevated INR [R79.1] Anemia, unspecified type [D64.9] GI bleed [K92.2] Patient Active Problem List   Diagnosis Date Noted   Chronic hepatitis C ([place]) 08/25/2021   Essential hypertension 08/25/2021   Diverticulosis of colon 12/15/2020   Acute on chronic blood loss anemia 12/13/2020   GI bleed 12/12/2020   Anemia    Hematochezia    Lower abdominal pain    Atrial fibrillation ([place]) 10/31/2018   Long term (current) use of anticoagulants 10/05/2018   Peripheral edema    Thrombocytopenia (H[facility])    Chronic diastolic (congestive) heart failure ([place]) 09/27/2016   Swelling of left lower extremity 09/27/2016   AKI (acute kidney injury) ([place]) 09/27/2016   Shortness of breath 08/28/2016   Pressure injury of skin 08/28/2016   Morbid obesity due to excess calories ([place]) 06/28/2015   PCP:  Alvester Chou, NP Pharmacy:   [place], [place] - [address] [place] [address] Phone: [phone] Fax: [phone]  Readmission Risk Interventions No flowsheet data found.

## 2021-08-26 NOTE — Progress Notes (Signed)
Per primary nurse, IV not needed at this time

## 2021-08-26 NOTE — Progress Notes (Signed)
Pt arrived from the ED & when conducting peri-care, multiple clots were removed. HR sustaining at 130-140 BPM, BP 106/65, MAP 75, O2 97% on RA. Loney Loh, MD made aware. Will continue to monitor.  Bari Edward, RN

## 2021-08-26 NOTE — Progress Notes (Addendum)
PROGRESS NOTE    Zoe Cobb   [id number]  DOB: 09-Feb-1956  DOA: 08/25/2021 PCP: Alvester Chou, NP   Brief Narrative:  Zoe Cobb is a 66 year old female who is wheelchair-bound and has atrial fibrillation, diastolic heart failure, hypertension, morbid obesity and chronic hep C.  She presents to the hospital for dark blood and blood clots per rectum for 1 day.  She states that she was passing pure blood and this was not mixed with stool.  She has not had any abdominal pain nausea or vomiting.  She has a history of a GI bleed secondary to diverticulosis last year. Hemoglobin noted to be 6.5   Subjective: She states she is not having any pain. She has no complaints.    Assessment & Plan:   Principal Problem:   GI bleed Acute blood loss anemia,  microcytic anemia Acquired coagulopathy in setting of Coumadin Hypotension -Suspected to be diverticular bleed - last took coumadin on Wednesday - CTA does not show any active hemorrhage and does show diverticulosis - She has received 3 units of packed red blood cells, Kcentra and Vit K - she passed more dark clots around 4 pm - she remains alert and oriented - waiting on repeat Hgb and INR now- have requested a critical care eval as BP remains in 17s and Hr is in 130-140s -We will check anemia panel  Active Problems:    Chronic diastolic (congestive) heart failure ([place]) -2D echo in 2018 revealed grade 2 diastolic dysfunction -Appears to be compensated despite receiving 3 units of packed red blood cells and about 1.5 L of fluid boluses -Continuous pulse ox ordered -Avoid further IV fluids right now -holding Demadex as well    Atrial fibrillation ([place])- Paroxysmal -She takes Toprol which is on hold at this time due to hypotension -Has had sinus tachycardia today - will order metoprolol 12.5 mg twice daily with holding parameters  Body mass index is 81.81 kg/m.  Wheelchair-bound - lives with spouse and  children   DVT prophylaxis: SCDs Start: 08/25/21 1336 Code Status: Full code Family Communication:  Level of Care: Level of care: Progressive Disposition Plan:  Status is: Inpatient  Remains inpatient appropriate because: ongoing bleeding and hemodynamic instability   Consultants:  GI Procedures:  none Antimicrobials:  Anti-infectives (From admission, onward)    None        Objective: Vitals:   08/26/21 0514 08/26/21 0951 08/26/21 1338 08/26/21 1339  BP: 90/61 105/63  (!) 91/58  Pulse: 97 (!) 133 (!) 125 (!) 122  Resp: 17 15 12 18   Temp: 98 F (36.7 C) 98.6 F (37 C) 98 F (36.7 C)   TempSrc: Oral Oral Oral   SpO2: 98% 100% 99% 100%  Weight:        Intake/Output Summary (Last 24 hours) at 08/26/2021 1558 Last data filed at 08/25/2021 1736 Gross per 24 hour  Intake 366 ml  Output --  Net 366 ml   Filed Weights   08/26/21 0203  Weight: (!) 196.4 kg    Examination: General exam: Appears comfortable  HEENT: PERRLA, oral mucosa moist, no sclera icterus or thrush Respiratory system: Clear to auscultation. Respiratory effort normal. Cardiovascular system: S1 & S2 heard, RRR.   Gastrointestinal system: Abdomen soft, non-tender, nondistended. Normal bowel sounds. Central nervous system: Alert and oriented. No focal neurological deficits. Extremities: No cyanosis, clubbing or edema Skin: No rashes or ulcers Psychiatry:  Mood & affect appropriate.     Data Reviewed: I have personally reviewed  following labs and imaging studies  CBC: Recent Labs  Lab 08/25/21 1043 08/25/21 1338 08/26/21 0448  WBC 8.5 11.0* 10.4  NEUTROABS 6.1  --   --   HGB 6.4* 6.5* 7.5*  HCT 22.2* 21.1* 23.9*  MCV 70.0* 70.3* 71.1*  PLT 314 283 0000000   Basic Metabolic Panel: Recent Labs  Lab 08/25/21 1043 08/26/21 0448  NA 139 139  K 4.2 3.6  CL 102 101  CO2 28 27  GLUCOSE 148* 144*  BUN 20 21  CREATININE 0.86 0.81  CALCIUM 7.8* 7.9*   GFR: CrCl cannot be calculated  (Unknown ideal weight.). Liver Function Tests: Recent Labs  Lab 08/25/21 1043  AST 22  ALT 9  ALKPHOS 84  BILITOT 0.5  PROT 6.5  ALBUMIN 1.9*   No results for input(s): LIPASE, AMYLASE in the last 168 hours. No results for input(s): AMMONIA in the last 168 hours. Coagulation Profile: Recent Labs  Lab 08/24/21 0000 08/25/21 1043 08/26/21 0448 08/26/21 0822  INR 1.8* 4.1* 3.5* 1.4*   Cardiac Enzymes: No results for input(s): CKTOTAL, CKMB, CKMBINDEX, TROPONINI in the last 168 hours. BNP (last 3 results) No results for input(s): PROBNP in the last 8760 hours. HbA1C: No results for input(s): HGBA1C in the last 72 hours. CBG: No results for input(s): GLUCAP in the last 168 hours. Lipid Profile: No results for input(s): CHOL, HDL, LDLCALC, TRIG, CHOLHDL, LDLDIRECT in the last 72 hours. Thyroid Function Tests: Recent Labs    08/25/21 1337  TSH 3.223   Anemia Panel: No results for input(s): VITAMINB12, FOLATE, FERRITIN, TIBC, IRON, RETICCTPCT in the last 72 hours. Urine analysis:    Component Value Date/Time   COLORURINE YELLOW 01/16/2021 1520   APPEARANCEUR HAZY (A) 01/16/2021 1520   LABSPEC 1.013 01/16/2021 1520   PHURINE 6.0 01/16/2021 1520   GLUCOSEU NEGATIVE 01/16/2021 1520   HGBUR TRACE (A) 01/16/2021 1520   BILIRUBINUR NEGATIVE 01/16/2021 1520   KETONESUR NEGATIVE 01/16/2021 1520   PROTEINUR TRACE (A) 01/16/2021 1520   NITRITE NEGATIVE 01/16/2021 1520   LEUKOCYTESUR LARGE (A) 01/16/2021 1520   Sepsis Labs: @LABRCNTIP (procalcitonin:4,lacticidven:4) ) Recent Results (from the past 240 hour(s))  Resp Panel by RT-PCR (Flu A&B, Covid) Nasopharyngeal Swab     Status: None   Collection Time: 08/25/21  1:06 PM   Specimen: Nasopharyngeal Swab; Nasopharyngeal(NP) swabs in vial transport medium  Result Value Ref Range Status   SARS Coronavirus 2 by RT PCR NEGATIVE NEGATIVE Final    Comment: (NOTE) SARS-CoV-2 target nucleic acids are NOT DETECTED.  The  SARS-CoV-2 RNA is generally detectable in upper respiratory specimens during the acute phase of infection. The lowest concentration of SARS-CoV-2 viral copies this assay can detect is 138 copies/mL. A negative result does not preclude SARS-Cov-2 infection and should not be used as the sole basis for treatment or other patient management decisions. A negative result may occur with  improper specimen collection/handling, submission of specimen other than nasopharyngeal swab, presence of viral mutation(s) within the areas targeted by this assay, and inadequate number of viral copies(<138 copies/mL). A negative result must be combined with clinical observations, patient history, and epidemiological information. The expected result is Negative.  Fact Sheet for Patients:  EntrepreneurPulse.com.au  Fact Sheet for Healthcare Providers:  IncredibleEmployment.be  This test is no t yet approved or cleared by the [place] FDA and  has been authorized for detection and/or diagnosis of SARS-CoV-2 by FDA under an Emergency Use Authorization (EUA). This EUA will remain  in effect (  meaning this test can be used) for the duration of the COVID-19 declaration under Section 564(b)(1) of the Act, 21 U.S.C.section 360bbb-3(b)(1), unless the authorization is terminated  or revoked sooner.       Influenza A by PCR NEGATIVE NEGATIVE Final   Influenza B by PCR NEGATIVE NEGATIVE Final    Comment: (NOTE) The Xpert Xpress SARS-CoV-2/FLU/RSV plus assay is intended as an aid in the diagnosis of influenza from Nasopharyngeal swab specimens and should not be used as a sole basis for treatment. Nasal washings and aspirates are unacceptable for Xpert Xpress SARS-CoV-2/FLU/RSV testing.  Fact Sheet for Patients: EntrepreneurPulse.com.au  Fact Sheet for Healthcare Providers: IncredibleEmployment.be  This test is not yet approved or  cleared by the [place] FDA and has been authorized for detection and/or diagnosis of SARS-CoV-2 by FDA under an Emergency Use Authorization (EUA). This EUA will remain in effect (meaning this test can be used) for the duration of the COVID-19 declaration under Section 564(b)(1) of the Act, 21 U.S.C. section 360bbb-3(b)(1), unless the authorization is terminated or revoked.  Performed at [facility], [place] [address], [place], [place] [address]          Radiology Studies: VAS [name] LOWER EXTREMITY VENOUS (DVT)  Result Date: 08/25/2021  Lower Venous DVT Study Patient Name:  MARUA ZIGAN  Date of Exam:   08/25/2021 Medical Rec #: [id number]         Accession #:    [id number] Date of Birth: [birth year]          Patient Gender: F Patient Age:   33 years Exam Location:  [facility] Procedure:      VAS [name] LOWER EXTREMITY VENOUS (DVT) Referring Phys: Orma Flaming --------------------------------------------------------------------------------  Indications: Edema.  Risk Factors: None identified. Limitations: Body habitus, poor ultrasound/tissue interface and patient positioning. Comparison Study: No prior studies. Performing Technologist: Oliver Hum RVT  Examination Guidelines: A complete evaluation includes B-mode imaging, spectral Doppler, color Doppler, and power Doppler as needed of all accessible portions of each vessel. Bilateral testing is considered an integral part of a complete examination. Limited examinations for reoccurring indications may be performed as noted. The reflux portion of the exam is performed with the patient in reverse Trendelenburg.  +-----+---------------+---------+-----------+----------+--------------+  RIGHT Compressibility Phasicity Spontaneity Properties Thrombus Aging  +-----+---------------+---------+-----------+----------+--------------+  CFV   Full            Yes       Yes                                     +-----+---------------+---------+-----------+----------+--------------+   +---------+---------------+---------+-----------+----------+-------------------+  LEFT      Compressibility Phasicity Spontaneity Properties Thrombus Aging       +---------+---------------+---------+-----------+----------+-------------------+  CFV       Full            Yes       Yes                                         +---------+---------------+---------+-----------+----------+-------------------+  SFJ       Full                                                                  +---------+---------------+---------+-----------+----------+-------------------+  FV Prox   Full                                                                  +---------+---------------+---------+-----------+----------+-------------------+  FV Mid    Full                                                                  +---------+---------------+---------+-----------+----------+-------------------+  FV Distal                 Yes       Yes                                         +---------+---------------+---------+-----------+----------+-------------------+  PFV       Full                                                                  +---------+---------------+---------+-----------+----------+-------------------+  POP       Full            Yes       Yes                                         +---------+---------------+---------+-----------+----------+-------------------+  PTV       Full                                                                  +---------+---------------+---------+-----------+----------+-------------------+  PERO                                                       Not well visualized  +---------+---------------+---------+-----------+----------+-------------------+    Summary: RIGHT: - No evidence of common femoral vein obstruction.  LEFT: - There is no evidence of deep vein thrombosis in the lower extremity. However, portions of this  examination were limited- see technologist comments above.  - No cystic structure found in the popliteal fossa.  *See table(s) above for measurements and observations. Electronically signed by Orlie Pollen on 08/25/2021 at 7:03:49 PM.    Final    CT Angio Abd/Pel w/ and/or w/o  Result Date: 08/26/2021 CLINICAL DATA:  Lower GI bleed. Continuous bloody diarrhea for 3 days. EXAM: CTA ABDOMEN AND PELVIS WITHOUT AND WITH CONTRAST TECHNIQUE: Multidetector  CT imaging of the abdomen and pelvis was performed using the standard protocol during bolus administration of intravenous contrast. Multiplanar reconstructed images and MIPs were obtained and reviewed to evaluate the vascular anatomy. RADIATION DOSE REDUCTION: This exam was performed according to the departmental dose-optimization program which includes automated exposure control, adjustment of the mA and/or kV according to patient size and/or use of iterative reconstruction technique. CONTRAST:  119mL OMNIPAQUE IOHEXOL 350 MG/ML SOLN COMPARISON:  None. FINDINGS: VASCULAR Aorta: Normal caliber aorta without aneurysm, dissection, vasculitis or significant stenosis. Celiac: No signal abnormality. SMA: No significant abnormality. Renals: Not well evaluated due to suboptimal opacification. IMA: Patent without evidence of aneurysm, dissection, vasculitis or significant stenosis. Inflow: Patent without evidence of aneurysm, dissection, vasculitis or significant stenosis. Proximal Outflow: Bilateral common femoral and visualized portions of the superficial and profunda femoral arteries are patent without evidence of aneurysm, dissection, vasculitis or significant stenosis. Veins: No obvious venous abnormality is identified. Evaluation is limited due to suboptimal opacification, even on the portal venous phase. Review of the MIP images confirms the above findings. NON-VASCULAR Lower chest: No acute abnormality. Hepatobiliary: Cholelithiasis. No significant abnormality of the  liver or bile ducts. Pancreas: Unremarkable. No pancreatic ductal dilatation or surrounding inflammatory changes. Spleen: Normal in size without focal abnormality. Adrenals/Urinary Tract: Adrenal glands are unremarkable. 2.5 cm simple cyst present in the upper pole of the right kidney. 1.3 cm simple cyst seen in the mid right kidney. 1.3 cm simple left renal cyst. Kidneys, ureters, and bladder are otherwise unremarkable. Stomach/Bowel: No bowel dilatation to indicate ileus or obstruction. Evaluation for acute hemorrhage is significantly limited due to streak artifact created from patient's body habitus. Sigmoid colon diverticulosis without evidence of acute diverticulitis. Lymphatic: Mildly prominent he left inguinal lymph node is seen measuring 1.2 cm in short axis, is most likely inflammatory. Otherwise no enlarged abdominal or pelvic lymph nodes. Reproductive: Uterus and bilateral adnexa are unremarkable. Other: Mild diastasis of the rectus sheath. Musculoskeletal: Grade 1-2 anterolisthesis of L5 on S1. no acute osseous abnormality identified. IMPRESSION: VASCULAR No active gastrointestinal hemorrhage identified. However, please note that this exam is limited for hemorrhage due to extensive streak artifact created by patient's body habitus. NON-VASCULAR Sigmoid colon diverticulosis without evidence of acute diverticulitis. Electronically Signed   By: Miachel Roux M.D.   On: 08/26/2021 10:35      Scheduled Meds:  sodium chloride   Intravenous Once   pantoprazole (PROTONIX) IV  40 mg Intravenous Q12H   sodium chloride flush  3 mL Intravenous Q12H   Continuous Infusions:  sodium chloride     sodium chloride 20 mL/hr at 08/26/21 0042     LOS: 0 days      Debbe Odea, MD [facility] Pager: [email or web address] 08/26/2021, 3:58 PM

## 2021-08-27 LAB — CBC
HCT: 20.6 % — ABNORMAL LOW (ref 36.0–46.0)
HCT: 22 % — ABNORMAL LOW (ref 36.0–46.0)
Hemoglobin: 6.6 g/dL — CL (ref 12.0–15.0)
Hemoglobin: 7 g/dL — ABNORMAL LOW (ref 12.0–15.0)
MCH: 23.3 pg — ABNORMAL LOW (ref 26.0–34.0)
MCH: 23.9 pg — ABNORMAL LOW (ref 26.0–34.0)
MCHC: 32 g/dL (ref 30.0–36.0)
MCHC: 31.8 g/dL (ref 30.0–36.0)
MCV: 72.8 fL — ABNORMAL LOW (ref 80.0–100.0)
MCV: 75.1 fL — ABNORMAL LOW (ref 80.0–100.0)
Platelets: 250 10*3/uL (ref 150–400)
Platelets: 266 10*3/uL (ref 150–400)
RBC: 2.83 MIL/uL — ABNORMAL LOW (ref 3.87–5.11)
RBC: 2.93 MIL/uL — ABNORMAL LOW (ref 3.87–5.11)
RDW: 21.9 % — ABNORMAL HIGH (ref 11.5–15.5)
RDW: 23 % — ABNORMAL HIGH (ref 11.5–15.5)
WBC: 8.6 10*3/uL (ref 4.0–10.5)
WBC: 8.6 10*3/uL (ref 4.0–10.5)
nRBC: 0 % (ref 0.0–0.2)
nRBC: 0 % (ref 0.0–0.2)

## 2021-08-27 LAB — PROTIME-INR
INR: 1.3 — ABNORMAL HIGH (ref 0.8–1.2)
Prothrombin Time: 16.3 seconds — ABNORMAL HIGH (ref 11.4–15.2)

## 2021-08-27 LAB — PREPARE RBC (CROSSMATCH)

## 2021-08-27 MED ORDER — SODIUM CHLORIDE 0.9% IV SOLUTION: Freq: Once | INTRAVENOUS | Status: AC

## 2021-08-27 NOTE — Progress Notes (Signed)
PROGRESS NOTE    Zoe Cobb   [id number]  DOB: 10-Apr-1956  DOA: 08/25/2021 PCP: Alvester Chou, NP   Brief Narrative:  Zoe Cobb is a 66 year old female who is wheelchair-bound and has atrial fibrillation, diastolic heart failure, hypertension, morbid obesity and chronic hep C.  She presents to the hospital for dark blood and blood clots per rectum for 1 day.  She states that she was passing pure blood and this was not mixed with stool.  She has not had any abdominal pain nausea or vomiting.  She has a history of a GI bleed secondary to diverticulosis last year. Hemoglobin noted to be 6.5   Subjective: The patient states she is not sure if she is still bleeding.  The nurse states that she passed some clots overnight.  Assessment & Plan:   Principal Problem:   GI bleed Acute blood loss anemia,  microcytic anemia Acquired coagulopathy in setting of Coumadin Hypotension -Suspected to be diverticular bleed - last took coumadin on Wednesday - CTA does not show any active hemorrhage and does show diverticulosis - She has received Kcentra and Vit K - she passed more dark clots and hemoglobin is 6.6-bleeding may be slowing down but I do feel we need to watch her another night before we can decide on resumption of Coumadin -Continue to check hemoglobin every 12 hours    Active Problems:    Chronic diastolic (congestive) heart failure ([place]) -2D echo in 2018 revealed grade 2 diastolic dysfunction -Appears to be compensated despite receiving 3 units of packed red blood cells and about 1.5 L of fluid boluses -Continuous pulse ox ordered to follow for hypoxia -Avoid further IV fluids right now -holding Demadex as well    Atrial fibrillation ([place])- Paroxysmal -She takes Toprol which is on hold at this time due to hypotension - cont metoprolol 12.5 mg twice daily with holding parameters - not receiving due to hypotension  Body mass index is 81.81  kg/m.  Wheelchair-bound - lives with spouse and children -Uses a [employer] lift   DVT prophylaxis: SCDs Start: 08/25/21 1336 Code Status: Full code Family Communication:  Level of Care: Level of care: Progressive Disposition Plan:  Status is: Inpatient  Remains inpatient appropriate because: ongoing bleeding and hemodynamic instability   Consultants:  GI Procedures:  none Antimicrobials:  Anti-infectives (From admission, onward)    None        Objective: Vitals:   08/27/21 0451 08/27/21 0521 08/27/21 0812 08/27/21 0830  BP: (!) 80/52 (!) 87/70 90/61 (!) 88/62  Pulse: (!) 111 (!) 118 (!) 122 (!) 128  Resp: 17 16 17 19   Temp: 98.4 F (36.9 C)  98.5 F (36.9 C) 97.6 F (36.4 C)  TempSrc: Oral  Oral Oral  SpO2: 98% 100%  99%  Weight:        Intake/Output Summary (Last 24 hours) at 08/27/2021 1050 Last data filed at 08/26/2021 1614 Gross per 24 hour  Intake 310.67 ml  Output --  Net 310.67 ml    Filed Weights   08/26/21 0203  Weight: (!) 196.4 kg    Examination: General exam: Appears comfortable  HEENT: PERRLA, oral mucosa moist, no sclera icterus or thrush Respiratory system: Clear to auscultation. Respiratory effort normal. Cardiovascular system: S1 & S2 heard, regular rate and rhythm-tachycardic Gastrointestinal system: Abdomen soft, non-tender, nondistended. Normal bowel sounds   Extremities: No cyanosis, clubbing or edema Skin: No rashes or ulcers Psychiatry:  Mood & affect appropriate.      Data  Reviewed: I have personally reviewed following labs and imaging studies  CBC: Recent Labs  Lab 08/25/21 1043 08/25/21 1338 08/26/21 0448 08/26/21 1633 08/27/21 0425  WBC 8.5 11.0* 10.4 10.9* 8.6  NEUTROABS 6.1  --   --   --   --   HGB 6.4* 6.5* 7.5* 7.5* 6.6*  HCT 22.2* 21.1* 23.9* 23.1* 20.6*  MCV 70.0* 70.3* 71.1* 70.2* 72.8*  PLT 314 283 261 266 [id number]    Basic Metabolic Panel: Recent Labs  Lab 08/25/21 1043 08/26/21 0448  NA 139 139  K  4.2 3.6  CL 102 101  CO2 28 27  GLUCOSE 148* 144*  BUN 20 21  CREATININE 0.86 0.81  CALCIUM 7.8* 7.9*    GFR: CrCl cannot be calculated (Unknown ideal weight.). Liver Function Tests: Recent Labs  Lab 08/25/21 1043  AST 22  ALT 9  ALKPHOS 84  BILITOT 0.5  PROT 6.5  ALBUMIN 1.9*    No results for input(s): LIPASE, AMYLASE in the last 168 hours. No results for input(s): AMMONIA in the last 168 hours. Coagulation Profile: Recent Labs  Lab 08/25/21 1043 08/26/21 0448 08/26/21 0822 08/26/21 1649 08/27/21 0425  INR 4.1* 3.5* 1.4* 1.4* 1.3*    Cardiac Enzymes: No results for input(s): CKTOTAL, CKMB, CKMBINDEX, TROPONINI in the last 168 hours. BNP (last 3 results) No results for input(s): PROBNP in the last 8760 hours. HbA1C: No results for input(s): HGBA1C in the last 72 hours. CBG: No results for input(s): GLUCAP in the last 168 hours. Lipid Profile: No results for input(s): CHOL, HDL, LDLCALC, TRIG, CHOLHDL, LDLDIRECT in the last 72 hours. Thyroid Function Tests: Recent Labs    08/25/21 1337  TSH 3.223    Anemia Panel: Recent Labs    08/26/21 1633  RETICCTPCT 2.1   Urine analysis:    Component Value Date/Time   COLORURINE YELLOW 01/16/2021 1520   APPEARANCEUR HAZY (A) 01/16/2021 1520   LABSPEC 1.013 01/16/2021 1520   PHURINE 6.0 01/16/2021 1520   GLUCOSEU NEGATIVE 01/16/2021 1520   HGBUR TRACE (A) 01/16/2021 1520   BILIRUBINUR NEGATIVE 01/16/2021 1520   KETONESUR NEGATIVE 01/16/2021 1520   PROTEINUR TRACE (A) 01/16/2021 1520   NITRITE NEGATIVE 01/16/2021 1520   LEUKOCYTESUR LARGE (A) 01/16/2021 1520   Sepsis Labs: @LABRCNTIP (procalcitonin:4,lacticidven:4) ) Recent Results (from the past 240 hour(s))  Resp Panel by RT-PCR (Flu A&B, Covid) Nasopharyngeal Swab     Status: None   Collection Time: 08/25/21  1:06 PM   Specimen: Nasopharyngeal Swab; Nasopharyngeal(NP) swabs in vial transport medium  Result Value Ref Range Status   SARS Coronavirus  2 by RT PCR NEGATIVE NEGATIVE Final    Comment: (NOTE) SARS-CoV-2 target nucleic acids are NOT DETECTED.  The SARS-CoV-2 RNA is generally detectable in upper respiratory specimens during the acute phase of infection. The lowest concentration of SARS-CoV-2 viral copies this assay can detect is 138 copies/mL. A negative result does not preclude SARS-Cov-2 infection and should not be used as the sole basis for treatment or other patient management decisions. A negative result may occur with  improper specimen collection/handling, submission of specimen other than nasopharyngeal swab, presence of viral mutation(s) within the areas targeted by this assay, and inadequate number of viral copies(<138 copies/mL). A negative result must be combined with clinical observations, patient history, and epidemiological information. The expected result is Negative.  Fact Sheet for Patients:  EntrepreneurPulse.com.au  Fact Sheet for Healthcare Providers:  IncredibleEmployment.be  This test is no t yet approved or cleared by  the [employer] and  has been authorized for detection and/or diagnosis of SARS-CoV-2 by FDA under an Emergency Use Authorization (EUA). This EUA will remain  in effect (meaning this test can be used) for the duration of the COVID-19 declaration under Section 564(b)(1) of the Act, 21 U.S.C.section 360bbb-3(b)(1), unless the authorization is terminated  or revoked sooner.       Influenza A by PCR NEGATIVE NEGATIVE Final   Influenza B by PCR NEGATIVE NEGATIVE Final    Comment: (NOTE) The Xpert Xpress SARS-CoV-2/FLU/RSV plus assay is intended as an aid in the diagnosis of influenza from Nasopharyngeal swab specimens and should not be used as a sole basis for treatment. Nasal washings and aspirates are unacceptable for Xpert Xpress SARS-CoV-2/FLU/RSV testing.  Fact Sheet for Patients: EntrepreneurPulse.com.au  Fact  Sheet for Healthcare Providers: IncredibleEmployment.be  This test is not yet approved or cleared by the [place] FDA and has been authorized for detection and/or diagnosis of SARS-CoV-2 by FDA under an Emergency Use Authorization (EUA). This EUA will remain in effect (meaning this test can be used) for the duration of the COVID-19 declaration under Section 564(b)(1) of the Act, 21 U.S.C. section 360bbb-3(b)(1), unless the authorization is terminated or revoked.  Performed at [facility], [place] [address] [place], [place] [address]          Radiology Studies: VAS [name] LOWER EXTREMITY VENOUS (DVT)  Result Date: 08/25/2021  Lower Venous DVT Study Patient Name:  JEANNET MACFARLANE  Date of Exam:   08/25/2021 Medical Rec #: [id number]         Accession #:    [id number] Date of Birth: 12-Jun-1956          Patient Gender: F Patient Age:   46 years Exam Location:  [facility] Procedure:      VAS [name] LOWER EXTREMITY VENOUS (DVT) Referring Phys: Orma Flaming --------------------------------------------------------------------------------  Indications: Edema.  Risk Factors: None identified. Limitations: Body habitus, poor ultrasound/tissue interface and patient positioning. Comparison Study: No prior studies. Performing Technologist: Oliver Hum RVT  Examination Guidelines: A complete evaluation includes B-mode imaging, spectral Doppler, color Doppler, and power Doppler as needed of all accessible portions of each vessel. Bilateral testing is considered an integral part of a complete examination. Limited examinations for reoccurring indications may be performed as noted. The reflux portion of the exam is performed with the patient in reverse Trendelenburg.  +-----+---------------+---------+-----------+----------+--------------+  RIGHT Compressibility Phasicity Spontaneity Properties Thrombus Aging  +-----+---------------+---------+-----------+----------+--------------+   CFV   Full            Yes       Yes                                    +-----+---------------+---------+-----------+----------+--------------+   +---------+---------------+---------+-----------+----------+-------------------+  LEFT      Compressibility Phasicity Spontaneity Properties Thrombus Aging       +---------+---------------+---------+-----------+----------+-------------------+  CFV       Full            Yes       Yes                                         +---------+---------------+---------+-----------+----------+-------------------+  SFJ       Full                                                                  +---------+---------------+---------+-----------+----------+-------------------+  FV Prox   Full                                                                  +---------+---------------+---------+-----------+----------+-------------------+  FV Mid    Full                                                                  +---------+---------------+---------+-----------+----------+-------------------+  FV Distal                 Yes       Yes                                         +---------+---------------+---------+-----------+----------+-------------------+  PFV       Full                                                                  +---------+---------------+---------+-----------+----------+-------------------+  POP       Full            Yes       Yes                                         +---------+---------------+---------+-----------+----------+-------------------+  PTV       Full                                                                  +---------+---------------+---------+-----------+----------+-------------------+  PERO                                                       Not well visualized  +---------+---------------+---------+-----------+----------+-------------------+    Summary: RIGHT: - No evidence of common femoral vein obstruction.  LEFT: - There is no evidence of  deep vein thrombosis in the lower extremity. However, portions of this examination were limited- see technologist comments above.  - No cystic structure found in the popliteal fossa.  *See table(s) above for measurements and observations. Electronically signed by Orlie Pollen on 08/25/2021 at 7:03:49 PM.    Final    CT Angio Abd/Pel w/ and/or w/o  Result Date: 08/26/2021 CLINICAL DATA:  Lower GI bleed. Continuous bloody diarrhea for 3 days. EXAM: CTA ABDOMEN AND PELVIS WITHOUT AND WITH CONTRAST TECHNIQUE: Multidetector  CT imaging of the abdomen and pelvis was performed using the standard protocol during bolus administration of intravenous contrast. Multiplanar reconstructed images and MIPs were obtained and reviewed to evaluate the vascular anatomy. RADIATION DOSE REDUCTION: This exam was performed according to the departmental dose-optimization program which includes automated exposure control, adjustment of the mA and/or kV according to patient size and/or use of iterative reconstruction technique. CONTRAST:  178mL OMNIPAQUE IOHEXOL 350 MG/ML SOLN COMPARISON:  None. FINDINGS: VASCULAR Aorta: Normal caliber aorta without aneurysm, dissection, vasculitis or significant stenosis. Celiac: No signal abnormality. SMA: No significant abnormality. Renals: Not well evaluated due to suboptimal opacification. IMA: Patent without evidence of aneurysm, dissection, vasculitis or significant stenosis. Inflow: Patent without evidence of aneurysm, dissection, vasculitis or significant stenosis. Proximal Outflow: Bilateral common femoral and visualized portions of the superficial and profunda femoral arteries are patent without evidence of aneurysm, dissection, vasculitis or significant stenosis. Veins: No obvious venous abnormality is identified. Evaluation is limited due to suboptimal opacification, even on the portal venous phase. Review of the MIP images confirms the above findings. NON-VASCULAR Lower chest: No acute  abnormality. Hepatobiliary: Cholelithiasis. No significant abnormality of the liver or bile ducts. Pancreas: Unremarkable. No pancreatic ductal dilatation or surrounding inflammatory changes. Spleen: Normal in size without focal abnormality. Adrenals/Urinary Tract: Adrenal glands are unremarkable. 2.5 cm simple cyst present in the upper pole of the right kidney. 1.3 cm simple cyst seen in the mid right kidney. 1.3 cm simple left renal cyst. Kidneys, ureters, and bladder are otherwise unremarkable. Stomach/Bowel: No bowel dilatation to indicate ileus or obstruction. Evaluation for acute hemorrhage is significantly limited due to streak artifact created from patient's body habitus. Sigmoid colon diverticulosis without evidence of acute diverticulitis. Lymphatic: Mildly prominent he left inguinal lymph node is seen measuring 1.2 cm in short axis, is most likely inflammatory. Otherwise no enlarged abdominal or pelvic lymph nodes. Reproductive: Uterus and bilateral adnexa are unremarkable. Other: Mild diastasis of the rectus sheath. Musculoskeletal: Grade 1-2 anterolisthesis of L5 on S1. no acute osseous abnormality identified. IMPRESSION: VASCULAR No active gastrointestinal hemorrhage identified. However, please note that this exam is limited for hemorrhage due to extensive streak artifact created by patient's body habitus. NON-VASCULAR Sigmoid colon diverticulosis without evidence of acute diverticulitis. Electronically Signed   By: Miachel Roux M.D.   On: 08/26/2021 10:35      Scheduled Meds:  sodium chloride   Intravenous Once   metoprolol tartrate  2.5 mg Intravenous Q8H   metoprolol tartrate  12.5 mg Oral BID   pantoprazole (PROTONIX) IV  40 mg Intravenous Q12H   sodium chloride flush  3 mL Intravenous Q12H   Continuous Infusions:  sodium chloride     sodium chloride 20 mL/hr at 08/27/21 0628     LOS: 1 day      Debbe Odea, MD [facility] Pager: [email or web address] 08/27/2021, 10:50 AM

## 2021-08-27 NOTE — Progress Notes (Addendum)
Subjective: No complaints.  Feeling well.  She denies any further hematochezia.  Objective: Vital signs in last 24 hours: Temp:  [97.6 F (36.4 C)-98.6 F (37 C)] 97.6 F (36.4 C) (01/21 0830) Pulse Rate:  [86-133] 128 (01/21 0830) Resp:  [12-19] 19 (01/21 0830) BP: (80-105)/(52-70) 88/62 (01/21 0830) SpO2:  [96 %-100 %] 99 % (01/21 0830) Last BM Date: 08/26/21  Intake/Output from previous day: 01/20 0701 - 01/21 0700 In: 310.7 [I.V.:310.7] Out: -  Intake/Output this shift: No intake/output data recorded.  General appearance: alert and no distress GI: soft, non-tender; bowel sounds normal; no masses,  no organomegaly  Lab Results: Recent Labs    08/26/21 0448 08/26/21 1633 08/27/21 0425  WBC 10.4 10.9* 8.6  HGB 7.5* 7.5* 6.6*  HCT 23.9* 23.1* 20.6*  PLT 261 266 250   BMET Recent Labs    08/25/21 1043 08/26/21 0448  NA 139 139  K 4.2 3.6  CL 102 101  CO2 28 27  GLUCOSE 148* 144*  BUN 20 21  CREATININE 0.86 0.81  CALCIUM 7.8* 7.9*   LFT Recent Labs    08/25/21 1043  PROT 6.5  ALBUMIN 1.9*  AST 22  ALT 9  ALKPHOS 84  BILITOT 0.5   PT/INR Recent Labs    08/26/21 1649 08/27/21 0425  LABPROT 17.1* 16.3*  INR 1.4* 1.3*   Hepatitis Panel No results for input(s): HEPBSAG, HCVAB, HEPAIGM, HEPBIGM in the last 72 hours. C-Diff No results for input(s): CDIFFTOX in the last 72 hours. Fecal Lactopherrin No results for input(s): FECLLACTOFRN in the last 72 hours.  Studies/Results: VAS [name] LOWER EXTREMITY VENOUS (DVT)  Result Date: 08/25/2021  Lower Venous DVT Study Patient Name:  Zoe Cobb  Date of Exam:   08/25/2021 Medical Rec #: [id number]         Accession #:    [id number] Date of Birth: November 27, 1955          Patient Gender: F Patient Age:   66 years Exam Location:  [facility] Procedure:      VAS [name] LOWER EXTREMITY VENOUS (DVT) Referring Phys: Orma Flaming --------------------------------------------------------------------------------   Indications: Edema.  Risk Factors: None identified. Limitations: Body habitus, poor ultrasound/tissue interface and patient positioning. Comparison Study: No prior studies. Performing Technologist: Oliver Hum RVT  Examination Guidelines: A complete evaluation includes B-mode imaging, spectral Doppler, color Doppler, and power Doppler as needed of all accessible portions of each vessel. Bilateral testing is considered an integral part of a complete examination. Limited examinations for reoccurring indications may be performed as noted. The reflux portion of the exam is performed with the patient in reverse Trendelenburg.  +-----+---------------+---------+-----------+----------+--------------+  RIGHT Compressibility Phasicity Spontaneity Properties Thrombus Aging  +-----+---------------+---------+-----------+----------+--------------+  CFV   Full            Yes       Yes                                    +-----+---------------+---------+-----------+----------+--------------+   +---------+---------------+---------+-----------+----------+-------------------+  LEFT      Compressibility Phasicity Spontaneity Properties Thrombus Aging       +---------+---------------+---------+-----------+----------+-------------------+  CFV       Full            Yes       Yes                                         +---------+---------------+---------+-----------+----------+-------------------+  SFJ       Full                                                                  +---------+---------------+---------+-----------+----------+-------------------+  FV Prox   Full                                                                  +---------+---------------+---------+-----------+----------+-------------------+  FV Mid    Full                                                                  +---------+---------------+---------+-----------+----------+-------------------+  FV Distal                 Yes       Yes                                          +---------+---------------+---------+-----------+----------+-------------------+  PFV       Full                                                                  +---------+---------------+---------+-----------+----------+-------------------+  POP       Full            Yes       Yes                                         +---------+---------------+---------+-----------+----------+-------------------+  PTV       Full                                                                  +---------+---------------+---------+-----------+----------+-------------------+  PERO                                                       Not well visualized  +---------+---------------+---------+-----------+----------+-------------------+    Summary: RIGHT: - No evidence of common femoral vein obstruction.  LEFT: - There is no evidence of deep vein thrombosis in the lower extremity. However, portions of this  examination were limited- see technologist comments above.  - No cystic structure found in the popliteal fossa.  *See table(s) above for measurements and observations. Electronically signed by Orlie Pollen on 08/25/2021 at 7:03:49 PM.    Final    CT Angio Abd/Pel w/ and/or w/o  Result Date: 08/26/2021 CLINICAL DATA:  Lower GI bleed. Continuous bloody diarrhea for 3 days. EXAM: CTA ABDOMEN AND PELVIS WITHOUT AND WITH CONTRAST TECHNIQUE: Multidetector CT imaging of the abdomen and pelvis was performed using the standard protocol during bolus administration of intravenous contrast. Multiplanar reconstructed images and MIPs were obtained and reviewed to evaluate the vascular anatomy. RADIATION DOSE REDUCTION: This exam was performed according to the departmental dose-optimization program which includes automated exposure control, adjustment of the mA and/or kV according to patient size and/or use of iterative reconstruction technique. CONTRAST:  140mL OMNIPAQUE IOHEXOL 350 MG/ML SOLN COMPARISON:  None. FINDINGS:  VASCULAR Aorta: Normal caliber aorta without aneurysm, dissection, vasculitis or significant stenosis. Celiac: No signal abnormality. SMA: No significant abnormality. Renals: Not well evaluated due to suboptimal opacification. IMA: Patent without evidence of aneurysm, dissection, vasculitis or significant stenosis. Inflow: Patent without evidence of aneurysm, dissection, vasculitis or significant stenosis. Proximal Outflow: Bilateral common femoral and visualized portions of the superficial and profunda femoral arteries are patent without evidence of aneurysm, dissection, vasculitis or significant stenosis. Veins: No obvious venous abnormality is identified. Evaluation is limited due to suboptimal opacification, even on the portal venous phase. Review of the MIP images confirms the above findings. NON-VASCULAR Lower chest: No acute abnormality. Hepatobiliary: Cholelithiasis. No significant abnormality of the liver or bile ducts. Pancreas: Unremarkable. No pancreatic ductal dilatation or surrounding inflammatory changes. Spleen: Normal in size without focal abnormality. Adrenals/Urinary Tract: Adrenal glands are unremarkable. 2.5 cm simple cyst present in the upper pole of the right kidney. 1.3 cm simple cyst seen in the mid right kidney. 1.3 cm simple left renal cyst. Kidneys, ureters, and bladder are otherwise unremarkable. Stomach/Bowel: No bowel dilatation to indicate ileus or obstruction. Evaluation for acute hemorrhage is significantly limited due to streak artifact created from patient's body habitus. Sigmoid colon diverticulosis without evidence of acute diverticulitis. Lymphatic: Mildly prominent he left inguinal lymph node is seen measuring 1.2 cm in short axis, is most likely inflammatory. Otherwise no enlarged abdominal or pelvic lymph nodes. Reproductive: Uterus and bilateral adnexa are unremarkable. Other: Mild diastasis of the rectus sheath. Musculoskeletal: Grade 1-2 anterolisthesis of L5 on S1. no  acute osseous abnormality identified. IMPRESSION: VASCULAR No active gastrointestinal hemorrhage identified. However, please note that this exam is limited for hemorrhage due to extensive streak artifact created by patient's body habitus. NON-VASCULAR Sigmoid colon diverticulosis without evidence of acute diverticulitis. Electronically Signed   By: Miachel Roux M.D.   On: 08/26/2021 10:35    Medications: Scheduled:  sodium chloride   Intravenous Once   metoprolol tartrate  2.5 mg Intravenous Q8H   metoprolol tartrate  12.5 mg Oral BID   pantoprazole (PROTONIX) IV  40 mg Intravenous Q12H   sodium chloride flush  3 mL Intravenous Q12H   Continuous:  sodium chloride     sodium chloride 20 mL/hr at 08/27/21 [id number]    Assessment/Plan: 1) Diverticular bleed. 2) Anemia.   Her HGB declined, as anticipated.  She does not have any further bleeding.  The patient understands the unpredictable nature of the diverticular bleeding.  Plan: 1) Transfuse. 2) No GI intervention.  Signing off. 3) Okay to stop pantoprazole.  ADDENDUM: Ok to restart coumadin.  LOS: 1 day   , D 08/27/2021, 8:56 AM

## 2021-08-27 NOTE — Progress Notes (Signed)
Progress Note  Patient Name: Zoe Cobb Date of Encounter: 08/27/2021  Attending physician: Debbe Odea, MD Primary care provider: Alvester Chou, NP Primary Cardiologist: Dr. Einar Gip   Subjective: Zoe Cobb is a 66 y.o. female who was seen and examined at bedside  Currently getting blood transfusion. Hb 6.6 this morning.  Has noted less bleeding over the last 24hrs.  Denies CP, shortness of breath, palpitation.   Objective: Vital Signs in the last 24 hours: Temp:  [97.6 F (36.4 C)-98.5 F (36.9 C)] 97.6 F (36.4 C) (01/21 0830) Pulse Rate:  [86-131] 128 (01/21 0830) Resp:  [12-19] 19 (01/21 0830) BP: (80-98)/(52-70) 88/62 (01/21 0830) SpO2:  [96 %-100 %] 99 % (01/21 0830)  Intake/Output:  Intake/Output Summary (Last 24 hours) at 08/27/2021 1017 Last data filed at 08/26/2021 1614 Gross per 24 hour  Intake 310.67 ml  Output --  Net 310.67 ml    Net IO Since Admission: 926.67 mL [08/27/21 1017]  Weights:  Filed Weights   08/26/21 0203  Weight: (!) 196.4 kg    Telemetry: Personally reviewed. Sinus Tachycardia.   Physical examination: PHYSICAL EXAM: Vitals with BMI 08/27/2021 08/27/2021 08/27/2021  Height - - -  Weight - - -  BMI - - -  Systolic 88 90 87  Diastolic 62 61 70  Pulse 0000000 122 118    CONSTITUTIONAL: Appears older than stated age, morbidly obese, No acute distress.  SKIN: Skin is warm and dry. No rash noted. No cyanosis. No pallor. No jaundice HEAD: Normocephalic and atraumatic.  EYES: No scleral icterus MOUTH/THROAT: Moist oral membranes.  NECK: Unable to evaluate for JVP due to adipose tissue. No thyromegaly noted. No carotid bruits  LYMPHATIC: No visible cervical adenopathy.  CHEST Normal respiratory effort. No intercostal retractions  LUNGS: Anteriorly clear to auscultation.  No stridor. No wheezes. No rales.  CARDIOVASCULAR: Tachycardia, regular, positive S1-S2, no murmurs rubs or gallops appreciated secondary to  tachycardia ABDOMINAL: Morbidly obese, soft, nontender, nondistended, positive bowel sounds in all 4 quadrants, no apparent ascites.  EXTREMITIES: No pitting edema, warm to touch, difficult to appreciate peripheral pulses. HEMATOLOGIC: No significant bruising NEUROLOGIC: Oriented to person, place, and time. Nonfocal. Normal muscle tone.  PSYCHIATRIC: Normal mood and affect. Normal behavior. Cooperative    Lab Results: Hematology Recent Labs  Lab 08/26/21 0448 08/26/21 1633 08/27/21 0425  WBC 10.4 10.9* 8.6  RBC 3.36* 3.29*   3.30* 2.83*  HGB 7.5* 7.5* 6.6*  HCT 23.9* 23.1* 20.6*  MCV 71.1* 70.2* 72.8*  MCH 22.3* 22.8* 23.3*  MCHC 31.4 32.5 32.0  RDW 21.0* 21.3* 21.9*  PLT 261 266 250    Chemistry Recent Labs  Lab 08/25/21 1043 08/26/21 0448  NA 139 139  K 4.2 3.6  CL 102 101  CO2 28 27  GLUCOSE 148* 144*  BUN 20 21  CREATININE 0.86 0.81  CALCIUM 7.8* 7.9*  PROT 6.5  --   ALBUMIN 1.9*  --   AST 22  --   ALT 9  --   ALKPHOS 84  --   BILITOT 0.5  --   GFRNONAA >60 >60  ANIONGAP 9 11     Cardiac Enzymes: Cardiac Panel (last 3 results) No results for input(s): CKTOTAL, CKMB, TROPONINIHS, RELINDX in the last 72 hours.  BNP (last 3 results) No results for input(s): BNP in the last 8760 hours.  ProBNP (last 3 results) No results for input(s): PROBNP in the last 8760 hours.   DDimer No results for input(s): DDIMER in the  last 168 hours.   Hemoglobin A1c:  Lab Results  Component Value Date   HGBA1C 5.5 08/28/2016   MPG 111 08/28/2016    TSH  Recent Labs    08/25/21 1337  TSH 3.223    Lipid Panel     Component Value Date/Time   CHOL 139 08/28/2016 1401   TRIG 53 08/28/2016 1401   HDL 58 08/28/2016 1401   CHOLHDL 2.4 08/28/2016 1401   VLDL 11 08/28/2016 1401   LDLCALC 70 08/28/2016 1401    Imaging: VAS [name] LOWER EXTREMITY VENOUS (DVT)  Result Date: 08/25/2021  Lower Venous DVT Study Patient Name:  Zoe Cobb  Date of Exam:   08/25/2021  Medical Rec #: [id number]         Accession #:    [id number] Date of Birth: 10-13-1955          Patient Gender: F Patient Age:   26 years Exam Location:  [facility] Procedure:      VAS [name] LOWER EXTREMITY VENOUS (DVT) Referring Phys: Orma Flaming --------------------------------------------------------------------------------  Indications: Edema.  Risk Factors: None identified. Limitations: Body habitus, poor ultrasound/tissue interface and patient positioning. Comparison Study: No prior studies. Performing Technologist: Oliver Hum RVT  Examination Guidelines: A complete evaluation includes B-mode imaging, spectral Doppler, color Doppler, and power Doppler as needed of all accessible portions of each vessel. Bilateral testing is considered an integral part of a complete examination. Limited examinations for reoccurring indications may be performed as noted. The reflux portion of the exam is performed with the patient in reverse Trendelenburg.  +-----+---------------+---------+-----------+----------+--------------+  RIGHT Compressibility Phasicity Spontaneity Properties Thrombus Aging  +-----+---------------+---------+-----------+----------+--------------+  CFV   Full            Yes       Yes                                    +-----+---------------+---------+-----------+----------+--------------+   +---------+---------------+---------+-----------+----------+-------------------+  LEFT      Compressibility Phasicity Spontaneity Properties Thrombus Aging       +---------+---------------+---------+-----------+----------+-------------------+  CFV       Full            Yes       Yes                                         +---------+---------------+---------+-----------+----------+-------------------+  SFJ       Full                                                                  +---------+---------------+---------+-----------+----------+-------------------+  FV Prox   Full                                                                   +---------+---------------+---------+-----------+----------+-------------------+  FV Mid    Full                                                                  +---------+---------------+---------+-----------+----------+-------------------+  FV Distal                 Yes       Yes                                         +---------+---------------+---------+-----------+----------+-------------------+  PFV       Full                                                                  +---------+---------------+---------+-----------+----------+-------------------+  POP       Full            Yes       Yes                                         +---------+---------------+---------+-----------+----------+-------------------+  PTV       Full                                                                  +---------+---------------+---------+-----------+----------+-------------------+  PERO                                                       Not well visualized  +---------+---------------+---------+-----------+----------+-------------------+    Summary: RIGHT: - No evidence of common femoral vein obstruction.  LEFT: - There is no evidence of deep vein thrombosis in the lower extremity. However, portions of this examination were limited- see technologist comments above.  - No cystic structure found in the popliteal fossa.  *See table(s) above for measurements and observations. Electronically signed by Orlie Pollen on 08/25/2021 at 7:03:49 PM.    Final    CT Angio Abd/Pel w/ and/or w/o  Result Date: 08/26/2021 CLINICAL DATA:  Lower GI bleed. Continuous bloody diarrhea for 3 days. EXAM: CTA ABDOMEN AND PELVIS WITHOUT AND WITH CONTRAST TECHNIQUE: Multidetector CT imaging of the abdomen and pelvis was performed using the standard protocol during bolus administration of intravenous contrast. Multiplanar reconstructed images and MIPs were obtained and reviewed to evaluate the vascular anatomy.  RADIATION DOSE REDUCTION: This exam was performed according to the departmental dose-optimization program which includes automated exposure control, adjustment of the mA and/or kV according to patient size and/or use of iterative reconstruction technique. CONTRAST:  172mL OMNIPAQUE IOHEXOL 350 MG/ML SOLN COMPARISON:  None. FINDINGS: VASCULAR Aorta: Normal caliber aorta without aneurysm, dissection, vasculitis or significant stenosis. Celiac: No signal abnormality. SMA: No significant abnormality. Renals: Not well evaluated due to suboptimal opacification. IMA: Patent without evidence of aneurysm, dissection, vasculitis or significant stenosis. Inflow: Patent without evidence of aneurysm, dissection, vasculitis or significant stenosis. Proximal Outflow: Bilateral common femoral and visualized portions of the superficial and profunda femoral arteries are  patent without evidence of aneurysm, dissection, vasculitis or significant stenosis. Veins: No obvious venous abnormality is identified. Evaluation is limited due to suboptimal opacification, even on the portal venous phase. Review of the MIP images confirms the above findings. NON-VASCULAR Lower chest: No acute abnormality. Hepatobiliary: Cholelithiasis. No significant abnormality of the liver or bile ducts. Pancreas: Unremarkable. No pancreatic ductal dilatation or surrounding inflammatory changes. Spleen: Normal in size without focal abnormality. Adrenals/Urinary Tract: Adrenal glands are unremarkable. 2.5 cm simple cyst present in the upper pole of the right kidney. 1.3 cm simple cyst seen in the mid right kidney. 1.3 cm simple left renal cyst. Kidneys, ureters, and bladder are otherwise unremarkable. Stomach/Bowel: No bowel dilatation to indicate ileus or obstruction. Evaluation for acute hemorrhage is significantly limited due to streak artifact created from patient's body habitus. Sigmoid colon diverticulosis without evidence of acute diverticulitis. Lymphatic:  Mildly prominent he left inguinal lymph node is seen measuring 1.2 cm in short axis, is most likely inflammatory. Otherwise no enlarged abdominal or pelvic lymph nodes. Reproductive: Uterus and bilateral adnexa are unremarkable. Other: Mild diastasis of the rectus sheath. Musculoskeletal: Grade 1-2 anterolisthesis of L5 on S1. no acute osseous abnormality identified. IMPRESSION: VASCULAR No active gastrointestinal hemorrhage identified. However, please note that this exam is limited for hemorrhage due to extensive streak artifact created by patient's body habitus. NON-VASCULAR Sigmoid colon diverticulosis without evidence of acute diverticulitis. Electronically Signed   By: Miachel Roux M.D.   On: 08/26/2021 10:35    CARDIAC DATABASE: EKG: 08/26/2021: Sinus tachycardia, 118 bpm, without underlying injury pattern.   Echocardiogram: [09/2016]: Left ventricle: The cavity size was mildly dilated. Wall thickness was increased in a pattern of mild LVH. Systolic function was normal. The estimated ejection fraction was in the range of 55% to 60%. Wall motion was normal; there were no regional wall motion abnormalities. Features are consistent with a pseudonormal left ventricular filling pattern, with concomitant abnormal relaxation and increased filling pressure (grade 2 diastolic dysfunction). - Mitral valve: Calcified annulus. - Left atrium: The atrium was moderately dilated. - Right atrium: The atrium was mildly dilated. - Pulmonary arteries: Systolic pressure was mildly increased. PA peak pressure: 42 mm Hg (S).   Scheduled Meds:  sodium chloride   Intravenous Once   metoprolol tartrate  2.5 mg Intravenous Q8H   metoprolol tartrate  12.5 mg Oral BID   pantoprazole (PROTONIX) IV  40 mg Intravenous Q12H   sodium chloride flush  3 mL Intravenous Q12H    Continuous Infusions:  sodium chloride     sodium chloride 20 mL/hr at 08/27/21 0628    PRN Meds: sodium chloride, acetaminophen **OR**  acetaminophen, sodium chloride flush, traMADol   IMPRESSION & RECOMMENDATIONS: Zoe Cobb is a 66 y.o. African-American female whose past medical history and cardiac risk factors include: Paroxysmal atrial fibrillation on Coumadin, HFpEF, hypertension, morbid obesity (BMI 81.8) chronic hepatitis C, history of GI bleed.  Impression:  Tachycardia and Hypotension - secondary to acute GI bleeding.  Supratherapeutic INR on admission - resolved.  Paroxysmal atrial fibrillation Long-term oral anticoagulation Chronic HFpEF Morbid obesity (Body mass index is 81.81 kg/m.) Chronic hepatitis C History of GI bleed  Plan:  Cardiology consulted during his hospitalization due to tachycardia and hypotension in the setting of acute GI bleed.  Her current hemodynamics are possibly secondary to acute GI bleed and anemia.  Morning hemoglobin 6.6 g/dL.  In the process of receiving another unit of blood.  I suspect that her tachycardia and hypertension should improve  once her anemia is resolved.   Patient has orders for IV Lopressor 2.5 mg IV every 8 hours with holding parameters.  Continue telemetry, personally reviewed.  Has been evaluated by ICU team but deemed not a candidate as of now.  Lactic acid within normal limits.  From a cardiovascular standpoint no additional recommendations. With regards to oral anticoagulation we will hold off on reinitiating therapy until her hemoglobin is stable and she is cleared by GI team.   For now we will follow peripherally.  Please call if any questions or concerns arise during her hospitalization. Will create follow up visit w/ primary cardiologist once discharge.  Patient's questions and concerns were addressed to her satisfaction. She voices understanding of the instructions provided during this encounter.   This note was created using a voice recognition software as a result there may be grammatical errors inadvertently enclosed that do not reflect the  nature of this encounter. Every attempt is made to correct such errors.  Total Time spent: 35 minutes  Mechele Claude [facility]  Pager: [phone] Office: [phone] 08/27/2021, 10:17 AM

## 2021-08-27 NOTE — Progress Notes (Signed)
PCCM Brief Progress Note  Seen at bedside this morning. Doing well overall, still with some clots overnight. Getting another unit of blood. Lactic wnl.  Will sign off but happy to be reinvolved as condition changes   [place]

## 2021-08-27 NOTE — Progress Notes (Signed)
Pt critical lab Hemoglobin 6.6. Notified provider.

## 2021-08-27 NOTE — Progress Notes (Signed)
Pt BP 86/56. No limits set. Notified provider to see if want to give metoprolol.

## 2021-08-28 LAB — TYPE AND SCREEN
ABO/RH(D): A POS
Antibody Screen: NEGATIVE
Unit division: 0
Unit division: 0
Unit division: 0
Unit division: 0

## 2021-08-28 LAB — BPAM RBC
Blood Product Expiration Date: 202302072359
Blood Product Expiration Date: 202302082359
Blood Product Expiration Date: 202302082359
Blood Product Expiration Date: 202302112359
ISSUE DATE / TIME: 202301191418
ISSUE DATE / TIME: 202301192053
ISSUE DATE / TIME: 202301192304
ISSUE DATE / TIME: 202301210804
Unit Type and Rh: 6200
Unit Type and Rh: 6200
Unit Type and Rh: 6200
Unit Type and Rh: 6200

## 2021-08-28 LAB — CBC
HCT: 22.8 % — ABNORMAL LOW (ref 36.0–46.0)
HCT: 25 % — ABNORMAL LOW (ref 36.0–46.0)
Hemoglobin: 7.1 g/dL — ABNORMAL LOW (ref 12.0–15.0)
Hemoglobin: 8 g/dL — ABNORMAL LOW (ref 12.0–15.0)
MCH: 23.6 pg — ABNORMAL LOW (ref 26.0–34.0)
MCH: 24.8 pg — ABNORMAL LOW (ref 26.0–34.0)
MCHC: 31.1 g/dL (ref 30.0–36.0)
MCHC: 32 g/dL (ref 30.0–36.0)
MCV: 75.7 fL — ABNORMAL LOW (ref 80.0–100.0)
MCV: 77.4 fL — ABNORMAL LOW (ref 80.0–100.0)
Platelets: 266 10*3/uL (ref 150–400)
Platelets: 301 10*3/uL (ref 150–400)
RBC: 3.01 MIL/uL — ABNORMAL LOW (ref 3.87–5.11)
RBC: 3.23 MIL/uL — ABNORMAL LOW (ref 3.87–5.11)
RDW: 23.5 % — ABNORMAL HIGH (ref 11.5–15.5)
RDW: 23.5 % — ABNORMAL HIGH (ref 11.5–15.5)
WBC: 7.7 10*3/uL (ref 4.0–10.5)
WBC: 8.1 10*3/uL (ref 4.0–10.5)
nRBC: 0 % (ref 0.0–0.2)
nRBC: 0 % (ref 0.0–0.2)

## 2021-08-28 LAB — PROTIME-INR
INR: 1.2 (ref 0.8–1.2)
Prothrombin Time: 15.3 seconds — ABNORMAL HIGH (ref 11.4–15.2)

## 2021-08-28 LAB — PREPARE RBC (CROSSMATCH)

## 2021-08-28 MED ORDER — METOPROLOL TARTRATE 12.5 MG HALF TABLET
12.5000 mg | ORAL_TABLET | Freq: Two times a day (BID) | ORAL | Status: DC
  Administered 2021-08-28: 12.5 mg via ORAL
  Filled 2021-08-28: qty 1

## 2021-08-28 MED ORDER — WARFARIN SODIUM 2.5 MG PO TABS
2.5000 mg | ORAL_TABLET | Freq: Once | ORAL | Status: AC
Start: 2021-08-28 — End: 2021-08-28
  Administered 2021-08-28: 2.5 mg via ORAL
  Filled 2021-08-28: qty 1

## 2021-08-28 MED ORDER — SODIUM CHLORIDE 0.9% IV SOLUTION: Freq: Once | INTRAVENOUS | Status: DC

## 2021-08-28 MED ORDER — WARFARIN - PHARMACIST DOSING INPATIENT: Freq: Every day | Status: DC

## 2021-08-28 NOTE — Progress Notes (Signed)
ANTICOAGULATION CONSULT NOTE - Initial Consult  Pharmacy Consult for Warfarin Indication: atrial fibrillation  Allergies  Allergen Reactions   Penicillins Swelling    Has patient had a PCN reaction causing immediate rash, facial/tongue/throat swelling, SOB or lightheadedness with hypotension: Yes Has patient had a PCN reaction causing severe rash involving mucus membranes or skin necrosis: No Has patient had a PCN reaction that required hospitalization No Has patient had a PCN reaction occurring within the last 10 years: No If all of the above answers are "NO", then may proceed with Cephalosporin use.     Patient Measurements: Weight: (!) 196.4 kg (433 lb)  Vital Signs: Temp: 97.6 F (36.4 C) (01/22 1220) Temp Source: Oral (01/22 1220) BP: 98/68 (01/22 1220) Pulse Rate: 84 (01/22 1153)  Labs: Recent Labs    08/26/21 0448 08/26/21 0822 08/26/21 1649 08/27/21 0425 08/27/21 1525 08/28/21 0429  HGB 7.5*   < >  --  6.6* 7.0* 7.1*  HCT 23.9*   < >  --  20.6* 22.0* 22.8*  PLT 261   < >  --  250 266 266  LABPROT 34.8*   < > 17.1* 16.3*  --  15.3*  INR 3.5*   < > 1.4* 1.3*  --  1.2  CREATININE 0.81  --   --   --   --   --    < > = values in this interval not displayed.    CrCl cannot be calculated (Unknown ideal weight.).   Medical History: Past Medical History:  Diagnosis Date   CHF (congestive heart failure) (HCC)    Heart failure (HCC)    Hypertension    Morbid obesity (HCC)    Multiple sclerosis (HCC)    Paroxysmal atrial fibrillation (HCC)     Medications:  Scheduled:   sodium chloride   Intravenous Once   sodium chloride   Intravenous Once   metoprolol tartrate  12.5 mg Oral BID   sodium chloride flush  3 mL Intravenous Q12H    Assessment: 66 yo F presenting with diverticular bleed on 1/19, history of GI bleed and paroxysmal Afib on warfarin PTA (last dose 1/18 @ 1000). INR 4.1 on admission. Patient received Kcentra, vit K, and 4u PRBCs. Per GI, no  further bleeding - ok to resume warfarin today. Pharmacy consulted for warfarin dosing.   Today INR is subtherapeutic at 1.2. Hgb 7.1, plt 266. Patient to receive 1u PRBC x1 today.   PTA Dosing: Warfarin 5 mg PO on Monday, 2.5 mg PO all other days  Goal of Therapy:  INR 2-3 Monitor platelets by anticoagulation protocol: Yes   Plan:  Give Warfarin 2.5 mg PO x 1.  Daily INR. Monitor CBC and for signs/symptoms of bleeding.    Vance Peper, PharmD PGY1 Pharmacy Resident Phone [phone] 08/28/2021 12:30 PM   Please check AMION for all [facility] phone numbers After 10:00 PM, call [place] [phone]

## 2021-08-28 NOTE — Progress Notes (Addendum)
PROGRESS NOTE    Zoe Cobb   MEQ:[id number]  DOB: 10-03-1955  DOA: 08/25/2021 PCP: Marletta Lor, NP   Brief Narrative:  Zoe Cobb is a 66 year old female who is wheelchair-bound and has atrial fibrillation, diastolic heart failure, hypertension, morbid obesity and chronic hep C.  She presents to the hospital for dark blood and blood clots per rectum for 1 day.  She states that she was passing pure blood and this was not mixed with stool.  She has not had any abdominal pain nausea or vomiting.  She has a history of a GI bleed secondary to diverticulosis last year. Hemoglobin noted to be 6.5   Subjective: Per RN and patient, no bleeding overnight. She has no complaints. Happy she is allowed to eat regular food.   Assessment & Plan:   Principal Problem:   GI bleed Acute blood loss anemia,  microcytic anemia Acquired coagulopathy in setting of Coumadin Hypotension -Suspected to be diverticular bleed - last took coumadin on Wednesday- INR was 4.1 when admitted - CTA does not show any active hemorrhage and does show diverticulosis - She has received Kcentra and Vit K and 4 U of PRBC- will receive one more today as Hgb still ~ 7 - I will advance her diet as she seems to have stopped bleeding - BP still quite low - OK to resume Warfarin today per GI    Active Problems:    Chronic diastolic (congestive) heart failure (HCC) -2D echo in 2018 revealed grade 2 diastolic dysfunction -Continuous pulse ox ordered to follow for hypoxia -Avoid further IV fluids right now -holding Demadex as well - not hypoxic  -     Atrial fibrillation (HCC)- Paroxysmal -She takes Toprol at home which is on hold at this time due to hypotension - cont metoprolol 12.5 mg twice daily with holding parameters -  - HR in 70-80 at rest today - sinus rhythm   Super Morbid obesity Body mass index is 81.81 kg/m.  Wheelchair-bound - lives with spouse and children -Uses a Hoyer lift   DVT  prophylaxis: SCDs Start: 08/25/21 1336 Code Status: Full code Family Communication:  Level of Care: Level of care: Progressive Disposition Plan:  Status is: Inpatient  Remains inpatient appropriate because: ongoing bleeding and hemodynamic instability   Consultants:  GI Procedures:  none Antimicrobials:  Anti-infectives (From admission, onward)    None        Objective: Vitals:   08/28/21 0323 08/28/21 0500 08/28/21 0800 08/28/21 0917  BP: (!) 97/58 104/66 (!) 85/57 91/61  Pulse: 89 (!) 116 88 (!) 122  Resp: 17 18 16    Temp: 99 F (37.2 C)  98.5 F (36.9 C)   TempSrc: Axillary  Oral   SpO2: 97% 95% 98%   Weight:        Intake/Output Summary (Last 24 hours) at 08/28/2021 1101 Last data filed at 08/28/2021 0900 Gross per 24 hour  Intake 710 ml  Output --  Net 710 ml    Filed Weights   08/26/21 0203  Weight: (!) 196.4 kg    Examination: General exam: Appears comfortable  HEENT: PERRLA, oral mucosa moist, no sclera icterus or thrush Respiratory system: Clear to auscultation. Respiratory effort normal. Cardiovascular system: S1 & S2 heard, regular rate and rhythm Gastrointestinal system: Abdomen soft, non-tender, nondistended. Normal bowel sounds   Central nervous system: Alert and oriented. No focal neurological deficits. Extremities: No cyanosis, clubbing or edema Skin: No rashes or ulcers Psychiatry:  Mood & affect appropriate.  Data Reviewed: I have personally reviewed following labs and imaging studies  CBC: Recent Labs  Lab 08/25/21 1043 08/25/21 1338 08/26/21 0448 08/26/21 1633 08/27/21 0425 08/27/21 1525 08/28/21 0429  WBC 8.5   < > 10.4 10.9* 8.6 8.6 7.7  NEUTROABS 6.1  --   --   --   --   --   --   HGB 6.4*   < > 7.5* 7.5* 6.6* 7.0* 7.1*  HCT 22.2*   < > 23.9* 23.1* 20.6* 22.0* 22.8*  MCV 70.0*   < > 71.1* 70.2* 72.8* 75.1* 75.7*  PLT 314   < > 261 266 250 266 266   < > = values in this interval not displayed.    Basic  Metabolic Panel: Recent Labs  Lab 08/25/21 1043 08/26/21 0448  NA 139 139  K 4.2 3.6  CL 102 101  CO2 28 27  GLUCOSE 148* 144*  BUN 20 21  CREATININE 0.86 0.81  CALCIUM 7.8* 7.9*    GFR: CrCl cannot be calculated (Unknown ideal weight.). Liver Function Tests: Recent Labs  Lab 08/25/21 1043  AST 22  ALT 9  ALKPHOS 84  BILITOT 0.5  PROT 6.5  ALBUMIN 1.9*    No results for input(s): LIPASE, AMYLASE in the last 168 hours. No results for input(s): AMMONIA in the last 168 hours. Coagulation Profile: Recent Labs  Lab 08/26/21 0448 08/26/21 0822 08/26/21 1649 08/27/21 0425 08/28/21 0429  INR 3.5* 1.4* 1.4* 1.3* 1.2    Cardiac Enzymes: No results for input(s): CKTOTAL, CKMB, CKMBINDEX, TROPONINI in the last 168 hours. BNP (last 3 results) No results for input(s): PROBNP in the last 8760 hours. HbA1C: No results for input(s): HGBA1C in the last 72 hours. CBG: No results for input(s): GLUCAP in the last 168 hours. Lipid Profile: No results for input(s): CHOL, HDL, LDLCALC, TRIG, CHOLHDL, LDLDIRECT in the last 72 hours. Thyroid Function Tests: Recent Labs    08/25/21 1337  TSH 3.223    Anemia Panel: Recent Labs    08/26/21 1633  RETICCTPCT 2.1    Urine analysis:    Component Value Date/Time   COLORURINE YELLOW 01/16/2021 1520   APPEARANCEUR HAZY (A) 01/16/2021 1520   LABSPEC 1.013 01/16/2021 1520   PHURINE 6.0 01/16/2021 1520   GLUCOSEU NEGATIVE 01/16/2021 1520   HGBUR TRACE (A) 01/16/2021 1520   BILIRUBINUR NEGATIVE 01/16/2021 1520   KETONESUR NEGATIVE 01/16/2021 1520   PROTEINUR TRACE (A) 01/16/2021 1520   NITRITE NEGATIVE 01/16/2021 1520   LEUKOCYTESUR LARGE (A) 01/16/2021 1520   Sepsis Labs: @LABRCNTIP (procalcitonin:4,lacticidven:4) ) Recent Results (from the past 240 hour(s))  Resp Panel by RT-PCR (Flu A&B, Covid) Nasopharyngeal Swab     Status: None   Collection Time: 08/25/21  1:06 PM   Specimen: Nasopharyngeal Swab; Nasopharyngeal(NP)  swabs in vial transport medium  Result Value Ref Range Status   SARS Coronavirus 2 by RT PCR NEGATIVE NEGATIVE Final    Comment: (NOTE) SARS-CoV-2 target nucleic acids are NOT DETECTED.  The SARS-CoV-2 RNA is generally detectable in upper respiratory specimens during the acute phase of infection. The lowest concentration of SARS-CoV-2 viral copies this assay can detect is 138 copies/mL. A negative result does not preclude SARS-Cov-2 infection and should not be used as the sole basis for treatment or other patient management decisions. A negative result may occur with  improper specimen collection/handling, submission of specimen other than nasopharyngeal swab, presence of viral mutation(s) within the areas targeted by this assay, and inadequate number of  viral copies(<138 copies/mL). A negative result must be combined with clinical observations, patient history, and epidemiological information. The expected result is Negative.  Fact Sheet for Patients:  EntrepreneurPulse.com.au  Fact Sheet for Healthcare Providers:  IncredibleEmployment.be  This test is no t yet approved or cleared by the [place] FDA and  has been authorized for detection and/or diagnosis of SARS-CoV-2 by FDA under an Emergency Use Authorization (EUA). This EUA will remain  in effect (meaning this test can be used) for the duration of the COVID-19 declaration under Section 564(b)(1) of the Act, 21 U.S.C.section 360bbb-3(b)(1), unless the authorization is terminated  or revoked sooner.       Influenza A by PCR NEGATIVE NEGATIVE Final   Influenza B by PCR NEGATIVE NEGATIVE Final    Comment: (NOTE) The Xpert Xpress SARS-CoV-2/FLU/RSV plus assay is intended as an aid in the diagnosis of influenza from Nasopharyngeal swab specimens and should not be used as a sole basis for treatment. Nasal washings and aspirates are unacceptable for Xpert Xpress  SARS-CoV-2/FLU/RSV testing.  Fact Sheet for Patients: EntrepreneurPulse.com.au  Fact Sheet for Healthcare Providers: IncredibleEmployment.be  This test is not yet approved or cleared by the [place] FDA and has been authorized for detection and/or diagnosis of SARS-CoV-2 by FDA under an Emergency Use Authorization (EUA). This EUA will remain in effect (meaning this test can be used) for the duration of the COVID-19 declaration under Section 564(b)(1) of the Act, 21 U.S.C. section 360bbb-3(b)(1), unless the authorization is terminated or revoked.  Performed at [facility], [place] [address] [place],  [address]          Radiology Studies: No results found.    Scheduled Meds:  sodium chloride   Intravenous Once   sodium chloride   Intravenous Once   metoprolol tartrate  2.5 mg Intravenous Q8H   metoprolol tartrate  12.5 mg Oral BID   sodium chloride flush  3 mL Intravenous Q12H   Continuous Infusions:  sodium chloride     sodium chloride 20 mL/hr at 08/27/21 0628     LOS: 2 days      Debbe Odea, MD [facility] Pager: [email or web address] 08/28/2021, 11:01 AM

## 2021-08-29 LAB — TYPE AND SCREEN
ABO/RH(D): A POS
Antibody Screen: NEGATIVE
Unit division: 0

## 2021-08-29 LAB — CBC
HCT: 26.3 % — ABNORMAL LOW (ref 36.0–46.0)
Hemoglobin: 8.2 g/dL — ABNORMAL LOW (ref 12.0–15.0)
MCH: 24.3 pg — ABNORMAL LOW (ref 26.0–34.0)
MCHC: 31.2 g/dL (ref 30.0–36.0)
MCV: 77.8 fL — ABNORMAL LOW (ref 80.0–100.0)
Platelets: 317 10*3/uL (ref 150–400)
RBC: 3.38 MIL/uL — ABNORMAL LOW (ref 3.87–5.11)
RDW: 23.9 % — ABNORMAL HIGH (ref 11.5–15.5)
WBC: 7.4 10*3/uL (ref 4.0–10.5)
nRBC: 0 % (ref 0.0–0.2)

## 2021-08-29 LAB — BPAM RBC
Blood Product Expiration Date: 202302142359
ISSUE DATE / TIME: 202301221149
Unit Type and Rh: 6200

## 2021-08-29 LAB — PROTIME-INR
INR: 1.4 — ABNORMAL HIGH (ref 0.8–1.2)
Prothrombin Time: 17.2 seconds — ABNORMAL HIGH (ref 11.4–15.2)

## 2021-08-29 MED ORDER — WARFARIN SODIUM 2.5 MG PO TABS
2.5000 mg | ORAL_TABLET | Freq: Once | ORAL | Status: AC
  Administered 2021-08-29: 2.5 mg via ORAL
  Filled 2021-08-29: qty 1

## 2021-08-29 MED ORDER — METOPROLOL SUCCINATE ER 50 MG PO TB24: 25.0000 mg | ORAL_TABLET | Freq: Every day | ORAL | Status: DC

## 2021-08-29 MED ORDER — METOPROLOL SUCCINATE ER 25 MG PO TB24
12.5000 mg | ORAL_TABLET | Freq: Every day | ORAL | Status: DC
  Administered 2021-08-29: 12.5 mg via ORAL
  Filled 2021-08-29: qty 1

## 2021-08-29 NOTE — Progress Notes (Signed)
Mobility Specialist Progress Note    08/29/21 1603  Mobility  Bed Position Chair  Activity Transferred from bed to chair  Level of Assistance +2 (takes two people)  Assistive Device [employer]  Activity Response Tolerated fair  $Mobility charge 1 Mobility   [facility] Mobility Specialist  M.S. 2C and 6E: [phone] M.S. 4E: [phone]

## 2021-08-29 NOTE — Care Management Important Message (Signed)
Important Message  Patient Details  Name: Zoe Cobb MRN: [id number] Date of Birth: [birth year]   Medicare Important Message Given:  Yes     Renie Ora 08/29/2021, 10:22 AM

## 2021-08-29 NOTE — Care Management (Signed)
08-29-21 1643 [facility] notified for transportation home. Case Manager did make the staff RN aware that ETA is between 1800-1900. Information placed on the shadow chart. No further needs from Case Manager at this time.

## 2021-08-29 NOTE — Progress Notes (Signed)
ANTICOAGULATION CONSULT NOTE - Initial Consult  Pharmacy Consult for Warfarin Indication: atrial fibrillation  Allergies  Allergen Reactions   Penicillins Swelling    Has patient had a PCN reaction causing immediate rash, facial/tongue/throat swelling, SOB or lightheadedness with hypotension: Yes Has patient had a PCN reaction causing severe rash involving mucus membranes or skin necrosis: No Has patient had a PCN reaction that required hospitalization No Has patient had a PCN reaction occurring within the last 10 years: No If all of the above answers are "NO", then may proceed with Cephalosporin use.     Patient Measurements: Weight: (!) 196.4 kg (433 lb)  Vital Signs: Temp: 97.8 F (36.6 C) (01/23 0433) Temp Source: Oral (01/23 0433) BP: 106/69 (01/23 1333) Pulse Rate: 115 (01/23 1333)  Labs: Recent Labs    08/27/21 0425 08/27/21 1525 08/28/21 0429 08/28/21 2003 08/29/21 0649  HGB 6.6*   < > 7.1* 8.0* 8.2*  HCT 20.6*   < > 22.8* 25.0* 26.3*  PLT 250   < > 266 301 317  LABPROT 16.3*  --  15.3*  --  17.2*  INR 1.3*  --  1.2  --  1.4*   < > = values in this interval not displayed.     CrCl cannot be calculated (Unknown ideal weight.).   Medical History: Past Medical History:  Diagnosis Date   CHF (congestive heart failure) (HCC)    Heart failure (HCC)    Hypertension    Morbid obesity (HCC)    Multiple sclerosis (HCC)    Paroxysmal atrial fibrillation (HCC)     Medications:  Scheduled:   sodium chloride   Intravenous Once   sodium chloride   Intravenous Once   metoprolol succinate  12.5 mg Oral Daily   sodium chloride flush  3 mL Intravenous Q12H   Warfarin - Pharmacist Dosing Inpatient   Does not apply q1600    Assessment: 66 yo F presenting with diverticular bleed on 1/19, history of GI bleed and paroxysmal Afib on warfarin PTA (last dose 1/18 @ 1000). INR 4.1 on admission. Patient received Kcentra, vit K, and 4u PRBCs. Per GI, no further bleeding -  ok to resume warfarin today. Pharmacy consulted for warfarin dosing.   Today INR is subtherapeutic at 1.4. Hgb up to 8.2, plt normal. No additional blood needed.   PTA Dosing: Warfarin 5 mg PO on Monday, 2.5 mg PO all other days  Goal of Therapy:  INR 2-3 Monitor platelets by anticoagulation protocol: Yes   Plan:  Give Warfarin 2.5 mg again today Daily INR. Monitor CBC and for signs/symptoms of bleeding.   Erin Hearing PharmD., BCPS Clinical Pharmacist 08/29/2021 2:47 PM

## 2021-08-29 NOTE — Progress Notes (Signed)
PT Cancellation Note  Patient Details Name: Zoe Cobb MRN: [id number] DOB: September 07, 1955   Cancelled Treatment:    Reason Eval/Treat Not Completed: PT screened, no needs identified, will sign off Pt WC bound at baseline and uses hoyer lift for transfers. Mobility specialist transferred pt OOB to chair and pt tolerated fair per notes. No skilled PT needs at this time as pt close to baseline. Please re-consult if needs change.   Cindee Salt, DPT  Acute Rehabilitation Services  Pager: [phone] Office: [phone]    Zoe Cobb 08/29/2021, 4:15 PM

## 2021-08-29 NOTE — Discharge Summary (Signed)
Physician Discharge Summary  Zoe Cobb [id number] DOB: 12-20-1955 DOA: 08/25/2021  PCP: Alvester Chou, NP  Admit date: 08/25/2021 Discharge date: 08/29/2021  Admitted From: home  Disposition:  home    CODE STATUS:  Full code     Consultations: GI      Discharge Diagnoses:  Principal Problem:   GI bleed- Acute on chronic blood loss anemia Active Problems:   Chronic diastolic (congestive) heart failure (HCC)   Atrial fibrillation (HCC)   Chronic hepatitis C ([place])   Essential hypertension  Zoe Cobb is a 66 year old female who is wheelchair-bound and has atrial fibrillation, diastolic heart failure, hypertension, morbid obesity and chronic hep C.  She presents to the hospital for dark blood and blood clots per rectum for 1 day.  She states that she was passing pure blood and this was not mixed with stool.  She has not had any abdominal pain nausea or vomiting.  She has a history of a GI bleed (secondary to diverticulosis) last year. Hemoglobin noted to be 6.5 having dropped from 9.4 in 6/22   GI bleed Acute blood loss anemia,  microcytic anemia Acquired coagulopathy in setting of Coumadin Hypotension -Suspected to be diverticular bleed - last took coumadin on Wednesday- INR was 4.1 when admitted - CTA did not show any active hemorrhage  - She has received Kcentra and Vit K and a total of 5 U of PRBC-- -Hemoglobin now 8.2 - Warfarin resumed on 1/22-have recommended she take her home dose-she checks her INRs at home    Active Problems:     Chronic diastolic (congestive) heart failure ([place]) -2D echo in 2018 revealed grade 2 diastolic dysfunction -Continuous pulse ox ordered to follow for hypoxia -She has been lying flat in the bed and is not hypoxic or short of breath - For now we will hold Demadex and Imdur-discussed with Dr. Fulton Reek will get her an appointment for follow-up soon     Atrial fibrillation ([place])- Paroxysmal -Toprol initially held- being resumed  at half dose due to hypotension - she is mildly tachycardic (sinus tach) but is able to sit up in the chair today- not orthostatic    Super Morbid obesity Body mass index is 81.81 kg/m.   Wheelchair-bound - lives with spouse and children -Uses a Engineer, drilling lift     Discharge Exam: Vitals:   08/29/21 0433 08/29/21 1333  BP: 101/72 106/69  Pulse: (!) 113 (!) 115  Resp: 18   Temp: 97.8 F (36.6 C)   SpO2: 97%    Vitals:   08/28/21 2027 08/29/21 0100 08/29/21 0433 08/29/21 1333  BP: 111/63 113/67 101/72 106/69  Pulse: (!) 123 87 (!) 113 (!) 115  Resp: 18 18 18    Temp: 98.1 F (36.7 C) 98.7 F (37.1 C) 97.8 F (36.6 C)   TempSrc: Oral Oral Oral   SpO2: 99% 98% 97%   Weight:          Discharge Instructions   Allergies as of 08/29/2021       Reactions   Penicillins Swelling   Has patient had a PCN reaction causing immediate rash, facial/tongue/throat swelling, SOB or lightheadedness with hypotension: Yes Has patient had a PCN reaction causing severe rash involving mucus membranes or skin necrosis: No Has patient had a PCN reaction that required hospitalization No Has patient had a PCN reaction occurring within the last 10 years: No If all of the above answers are "NO", then may proceed with Cephalosporin use.  Medication List     STOP taking these medications    isosorbide mononitrate 60 MG 24 hr tablet Commonly known as: IMDUR   torsemide 20 MG tablet Commonly known as: DEMADEX       TAKE these medications    acetaminophen 325 MG suppository Commonly known as: TYLENOL Place 325 mg rectally every 4 (four) hours as needed for mild pain.   metoprolol succinate 50 MG 24 hr tablet Commonly known as: TOPROL-XL Take 0.5 tablets (25 mg total) by mouth daily. What changed: how much to take   One-A-Day VitaCraves Adult Chew Chew 1 tablet by mouth daily.   traMADol 50 MG tablet Commonly known as: ULTRAM Take 50 mg by mouth every 8 (eight) hours as  needed for moderate pain.   warfarin 5 MG tablet Commonly known as: COUMADIN Take as directed. If you are unsure how to take this medication, talk to your nurse or doctor. Original instructions: Take 1 tablet (5 mg total) by mouth daily. Refer to most recent anticoagulation note for most updated dosing instructions. What changed:  how much to take when to take this additional instructions        Follow-up Information     Adrian Prows, MD. Call in 2 week(s).   Specialty: Cardiology Why: To discuss [place] and alternative given recent GI bleed. Contact information: [place] [address] [phone]                Allergies  Allergen Reactions   Penicillins Swelling    Has patient had a PCN reaction causing immediate rash, facial/tongue/throat swelling, SOB or lightheadedness with hypotension: Yes Has patient had a PCN reaction causing severe rash involving mucus membranes or skin necrosis: No Has patient had a PCN reaction that required hospitalization No Has patient had a PCN reaction occurring within the last 10 years: No If all of the above answers are "NO", then may proceed with Cephalosporin use.       VAS [name] LOWER EXTREMITY VENOUS (DVT)  Result Date: 08/25/2021  Lower Venous DVT Study Patient Name:  Zoe Cobb  Date of Exam:   08/25/2021 Medical Rec #: [id number]         Accession #:    [id number] Date of Birth: 01/01/1956          Patient Gender: F Patient Age:   34 years Exam Location:  [facility] Procedure:      VAS [name] LOWER EXTREMITY VENOUS (DVT) Referring Phys: Orma Flaming --------------------------------------------------------------------------------  Indications: Edema.  Risk Factors: None identified. Limitations: Body habitus, poor ultrasound/tissue interface and patient positioning. Comparison Study: No prior studies. Performing Technologist: Oliver Hum RVT  Examination Guidelines: A complete evaluation includes B-mode  imaging, spectral Doppler, color Doppler, and power Doppler as needed of all accessible portions of each vessel. Bilateral testing is considered an integral part of a complete examination. Limited examinations for reoccurring indications may be performed as noted. The reflux portion of the exam is performed with the patient in reverse Trendelenburg.  +-----+---------------+---------+-----------+----------+--------------+  RIGHT Compressibility Phasicity Spontaneity Properties Thrombus Aging  +-----+---------------+---------+-----------+----------+--------------+  CFV   Full            Yes       Yes                                    +-----+---------------+---------+-----------+----------+--------------+   +---------+---------------+---------+-----------+----------+-------------------+  LEFT  Compressibility Phasicity Spontaneity Properties Thrombus Aging       +---------+---------------+---------+-----------+----------+-------------------+  CFV       Full            Yes       Yes                                         +---------+---------------+---------+-----------+----------+-------------------+  SFJ       Full                                                                  +---------+---------------+---------+-----------+----------+-------------------+  FV Prox   Full                                                                  +---------+---------------+---------+-----------+----------+-------------------+  FV Mid    Full                                                                  +---------+---------------+---------+-----------+----------+-------------------+  FV Distal                 Yes       Yes                                         +---------+---------------+---------+-----------+----------+-------------------+  PFV       Full                                                                  +---------+---------------+---------+-----------+----------+-------------------+  POP       Full             Yes       Yes                                         +---------+---------------+---------+-----------+----------+-------------------+  PTV       Full                                                                  +---------+---------------+---------+-----------+----------+-------------------+  PERO  Not well visualized  +---------+---------------+---------+-----------+----------+-------------------+    Summary: RIGHT: - No evidence of common femoral vein obstruction.  LEFT: - There is no evidence of deep vein thrombosis in the lower extremity. However, portions of this examination were limited- see technologist comments above.  - No cystic structure found in the popliteal fossa.  *See table(s) above for measurements and observations. Electronically signed by Orlie Pollen on 08/25/2021 at 7:03:49 PM.    Final    CT Angio Abd/Pel w/ and/or w/o  Result Date: 08/26/2021 CLINICAL DATA:  Lower GI bleed. Continuous bloody diarrhea for 3 days. EXAM: CTA ABDOMEN AND PELVIS WITHOUT AND WITH CONTRAST TECHNIQUE: Multidetector CT imaging of the abdomen and pelvis was performed using the standard protocol during bolus administration of intravenous contrast. Multiplanar reconstructed images and MIPs were obtained and reviewed to evaluate the vascular anatomy. RADIATION DOSE REDUCTION: This exam was performed according to the departmental dose-optimization program which includes automated exposure control, adjustment of the mA and/or kV according to patient size and/or use of iterative reconstruction technique. CONTRAST:  132mL OMNIPAQUE IOHEXOL 350 MG/ML SOLN COMPARISON:  None. FINDINGS: VASCULAR Aorta: Normal caliber aorta without aneurysm, dissection, vasculitis or significant stenosis. Celiac: No signal abnormality. SMA: No significant abnormality. Renals: Not well evaluated due to suboptimal opacification. IMA: Patent without evidence of aneurysm, dissection,  vasculitis or significant stenosis. Inflow: Patent without evidence of aneurysm, dissection, vasculitis or significant stenosis. Proximal Outflow: Bilateral common femoral and visualized portions of the superficial and profunda femoral arteries are patent without evidence of aneurysm, dissection, vasculitis or significant stenosis. Veins: No obvious venous abnormality is identified. Evaluation is limited due to suboptimal opacification, even on the portal venous phase. Review of the MIP images confirms the above findings. NON-VASCULAR Lower chest: No acute abnormality. Hepatobiliary: Cholelithiasis. No significant abnormality of the liver or bile ducts. Pancreas: Unremarkable. No pancreatic ductal dilatation or surrounding inflammatory changes. Spleen: Normal in size without focal abnormality. Adrenals/Urinary Tract: Adrenal glands are unremarkable. 2.5 cm simple cyst present in the upper pole of the right kidney. 1.3 cm simple cyst seen in the mid right kidney. 1.3 cm simple left renal cyst. Kidneys, ureters, and bladder are otherwise unremarkable. Stomach/Bowel: No bowel dilatation to indicate ileus or obstruction. Evaluation for acute hemorrhage is significantly limited due to streak artifact created from patient's body habitus. Sigmoid colon diverticulosis without evidence of acute diverticulitis. Lymphatic: Mildly prominent he left inguinal lymph node is seen measuring 1.2 cm in short axis, is most likely inflammatory. Otherwise no enlarged abdominal or pelvic lymph nodes. Reproductive: Uterus and bilateral adnexa are unremarkable. Other: Mild diastasis of the rectus sheath. Musculoskeletal: Grade 1-2 anterolisthesis of L5 on S1. no acute osseous abnormality identified. IMPRESSION: VASCULAR No active gastrointestinal hemorrhage identified. However, please note that this exam is limited for hemorrhage due to extensive streak artifact created by patient's body habitus. NON-VASCULAR Sigmoid colon diverticulosis  without evidence of acute diverticulitis. Electronically Signed   By: Miachel Roux M.D.   On: 08/26/2021 10:35     The results of significant diagnostics from this hospitalization (including imaging, microbiology, ancillary and laboratory) are listed below for reference.     Microbiology: Recent Results (from the past 240 hour(s))  Resp Panel by RT-PCR (Flu A&B, Covid) Nasopharyngeal Swab     Status: None   Collection Time: 08/25/21  1:06 PM   Specimen: Nasopharyngeal Swab; Nasopharyngeal(NP) swabs in vial transport medium  Result Value Ref Range Status   SARS Coronavirus 2 by RT PCR NEGATIVE NEGATIVE Final  Comment: (NOTE) SARS-CoV-2 target nucleic acids are NOT DETECTED.  The SARS-CoV-2 RNA is generally detectable in upper respiratory specimens during the acute phase of infection. The lowest concentration of SARS-CoV-2 viral copies this assay can detect is 138 copies/mL. A negative result does not preclude SARS-Cov-2 infection and should not be used as the sole basis for treatment or other patient management decisions. A negative result may occur with  improper specimen collection/handling, submission of specimen other than nasopharyngeal swab, presence of viral mutation(s) within the areas targeted by this assay, and inadequate number of viral copies(<138 copies/mL). A negative result must be combined with clinical observations, patient history, and epidemiological information. The expected result is Negative.  Fact Sheet for Patients:  EntrepreneurPulse.com.au  Fact Sheet for Healthcare Providers:  IncredibleEmployment.be  This test is no t yet approved or cleared by the [place] FDA and  has been authorized for detection and/or diagnosis of SARS-CoV-2 by FDA under an Emergency Use Authorization (EUA). This EUA will remain  in effect (meaning this test can be used) for the duration of the COVID-19 declaration under Section 564(b)(1)  of the Act, 21 U.S.C.section 360bbb-3(b)(1), unless the authorization is terminated  or revoked sooner.       Influenza A by PCR NEGATIVE NEGATIVE Final   Influenza B by PCR NEGATIVE NEGATIVE Final    Comment: (NOTE) The Xpert Xpress SARS-CoV-2/FLU/RSV plus assay is intended as an aid in the diagnosis of influenza from Nasopharyngeal swab specimens and should not be used as a sole basis for treatment. Nasal washings and aspirates are unacceptable for Xpert Xpress SARS-CoV-2/FLU/RSV testing.  Fact Sheet for Patients: EntrepreneurPulse.com.au  Fact Sheet for Healthcare Providers: IncredibleEmployment.be  This test is not yet approved or cleared by the [place] FDA and has been authorized for detection and/or diagnosis of SARS-CoV-2 by FDA under an Emergency Use Authorization (EUA). This EUA will remain in effect (meaning this test can be used) for the duration of the COVID-19 declaration under Section 564(b)(1) of the Act, 21 U.S.C. section 360bbb-3(b)(1), unless the authorization is terminated or revoked.  Performed at [facility], [place] [address], [place], [place] [address]      Labs: BNP (last 3 results) No results for input(s): BNP in the last 8760 hours. Basic Metabolic Panel: Recent Labs  Lab 08/25/21 1043 08/26/21 0448  NA 139 139  K 4.2 3.6  CL 102 101  CO2 28 27  GLUCOSE 148* 144*  BUN 20 21  CREATININE 0.86 0.81  CALCIUM 7.8* 7.9*   Liver Function Tests: Recent Labs  Lab 08/25/21 1043  AST 22  ALT 9  ALKPHOS 84  BILITOT 0.5  PROT 6.5  ALBUMIN 1.9*   No results for input(s): LIPASE, AMYLASE in the last 168 hours. No results for input(s): AMMONIA in the last 168 hours. CBC: Recent Labs  Lab 08/25/21 1043 08/25/21 1338 08/27/21 0425 08/27/21 1525 08/28/21 0429 08/28/21 2003 08/29/21 0649  WBC 8.5   < > 8.6 8.6 7.7 8.1 7.4  NEUTROABS 6.1  --   --   --   --   --   --   HGB 6.4*   < > 6.6* 7.0*  7.1* 8.0* 8.2*  HCT 22.2*   < > 20.6* 22.0* 22.8* 25.0* 26.3*  MCV 70.0*   < > 72.8* 75.1* 75.7* 77.4* 77.8*  PLT 314   < > 250 266 266 301 317   < > = values in this interval not displayed.   Cardiac  Enzymes: No results for input(s): CKTOTAL, CKMB, CKMBINDEX, TROPONINI in the last 168 hours. BNP: Invalid input(s): POCBNP CBG: No results for input(s): GLUCAP in the last 168 hours. D-Dimer No results for input(s): DDIMER in the last 72 hours. Hgb A1c No results for input(s): HGBA1C in the last 72 hours. Lipid Profile No results for input(s): CHOL, HDL, LDLCALC, TRIG, CHOLHDL, LDLDIRECT in the last 72 hours. Thyroid function studies No results for input(s): TSH, T4TOTAL, T3FREE, THYROIDAB in the last 72 hours.  Invalid input(s): FREET3 Anemia work up Recent Labs    08/26/21 1633  RETICCTPCT 2.1   Urinalysis    Component Value Date/Time   COLORURINE YELLOW 01/16/2021 1520   APPEARANCEUR HAZY (A) 01/16/2021 1520   LABSPEC 1.013 01/16/2021 1520   PHURINE 6.0 01/16/2021 1520   GLUCOSEU NEGATIVE 01/16/2021 1520   HGBUR TRACE (A) 01/16/2021 1520   BILIRUBINUR NEGATIVE 01/16/2021 1520   KETONESUR NEGATIVE 01/16/2021 1520   PROTEINUR TRACE (A) 01/16/2021 1520   NITRITE NEGATIVE 01/16/2021 1520   LEUKOCYTESUR LARGE (A) 01/16/2021 1520   Sepsis Labs Invalid input(s): PROCALCITONIN,  WBC,  LACTICIDVEN Microbiology Recent Results (from the past 240 hour(s))  Resp Panel by RT-PCR (Flu A&B, Covid) Nasopharyngeal Swab     Status: None   Collection Time: 08/25/21  1:06 PM   Specimen: Nasopharyngeal Swab; Nasopharyngeal(NP) swabs in vial transport medium  Result Value Ref Range Status   SARS Coronavirus 2 by RT PCR NEGATIVE NEGATIVE Final    Comment: (NOTE) SARS-CoV-2 target nucleic acids are NOT DETECTED.  The SARS-CoV-2 RNA is generally detectable in upper respiratory specimens during the acute phase of infection. The lowest concentration of SARS-CoV-2 viral copies this assay  can detect is 138 copies/mL. A negative result does not preclude SARS-Cov-2 infection and should not be used as the sole basis for treatment or other patient management decisions. A negative result may occur with  improper specimen collection/handling, submission of specimen other than nasopharyngeal swab, presence of viral mutation(s) within the areas targeted by this assay, and inadequate number of viral copies(<138 copies/mL). A negative result must be combined with clinical observations, patient history, and epidemiological information. The expected result is Negative.  Fact Sheet for Patients:  EntrepreneurPulse.com.au  Fact Sheet for Healthcare Providers:  IncredibleEmployment.be  This test is no t yet approved or cleared by the [place] FDA and  has been authorized for detection and/or diagnosis of SARS-CoV-2 by FDA under an Emergency Use Authorization (EUA). This EUA will remain  in effect (meaning this test can be used) for the duration of the COVID-19 declaration under Section 564(b)(1) of the Act, 21 U.S.C.section 360bbb-3(b)(1), unless the authorization is terminated  or revoked sooner.       Influenza A by PCR NEGATIVE NEGATIVE Final   Influenza B by PCR NEGATIVE NEGATIVE Final    Comment: (NOTE) The Xpert Xpress SARS-CoV-2/FLU/RSV plus assay is intended as an aid in the diagnosis of influenza from Nasopharyngeal swab specimens and should not be used as a sole basis for treatment. Nasal washings and aspirates are unacceptable for Xpert Xpress SARS-CoV-2/FLU/RSV testing.  Fact Sheet for Patients: EntrepreneurPulse.com.au  Fact Sheet for Healthcare Providers: IncredibleEmployment.be  This test is not yet approved or cleared by the [place] FDA and has been authorized for detection and/or diagnosis of SARS-CoV-2 by FDA under an Emergency Use Authorization (EUA). This EUA will  remain in effect (meaning this test can be used) for the duration of the COVID-19 declaration under Section 564(b)(1) of the  Act, 21 U.S.C. section 360bbb-3(b)(1), unless the authorization is terminated or revoked.  Performed at [facility], [place] [address] [place], [place] [address]        SIGNED:   Debbe Odea, MD  [facility] 08/29/2021, 4:32 PM

## 2021-09-01 ENCOUNTER — Ambulatory Visit: Payer: Self-pay | Admitting: Pharmacist

## 2021-09-01 DIAGNOSIS — I480 Paroxysmal atrial fibrillation: Secondary | ICD-10-CM

## 2021-09-01 DIAGNOSIS — Z7901 Long term (current) use of anticoagulants: Secondary | ICD-10-CM

## 2021-09-01 LAB — POCT INR: INR: 2.1 (ref 2.0–3.0)

## 2021-09-01 NOTE — Progress Notes (Signed)
Anticoagulation Management Zoe Cobb is a 66 y.o. female who reports to the clinic for monitoring of warfarin treatment.   Indication: atrial fibrillation CHA2DS2 Vasc Score 3 (Age 55-74, Female, CHF hx), HAS-BLED 1 (Age >31)  Duration: indefinite Supervising physician: Adrian Prows  Anticoagulation Clinic Visit History:  Patient does not report signs/symptoms of bleeding or thromboembolism  Other recent changes: Pt maintaining 4-5 serving of high Vit K meals/weeks.   Reports to having Vit K intake of broccoli, Kale, spinach, collard greens on a rotating bases.   Recent diverticular bleeding episode on 08/25/21. Pt hospitalized with acute blood loss anemia. Pt's INR during hospital admission of 4.1. Pt received Kcentra, Vit K and 5 units of PRBC. Hbg improved improved from 6.5 to 8.2 up on discharge. CTA did not show any active hemorrhage. Warfarin was held from 1/19-1/21. Home warfarin dose was restarted on 1/22. Pt had been taking warfarin 2.5 mg daily since. Pt denies any further episodes of GI bleeding concerns since being home.  Pt still waiting for her wheelchair to be fixed in order to be able to schedule her follow up OV w/ Dr. Einar Gip.   Anticoagulation Episode Summary     Current INR goal:  2.0-3.0  TTR:  52.8 % (2.9 y)  Next INR check:  09/07/2021  INR from last check:  2.1 (09/01/2021)  Weekly max warfarin dose:    Target end date:    INR check location:    Preferred lab:    Send INR reminders to:     Indications   Atrial fibrillation (HCC) [I48.91] Monitoring for long-term anticoagulant use (Resolved) [Z51.81 Z79.01]        Comments:           Allergies  Allergen Reactions   Penicillins Swelling    Has patient had a PCN reaction causing immediate rash, facial/tongue/throat swelling, SOB or lightheadedness with hypotension: Yes Has patient had a PCN reaction causing severe rash involving mucus membranes or skin necrosis: No Has patient had a PCN reaction  that required hospitalization No Has patient had a PCN reaction occurring within the last 10 years: No If all of the above answers are "NO", then may proceed with Cephalosporin use.     Current Outpatient Medications:    acetaminophen (TYLENOL) 325 MG suppository, Place 325 mg rectally every 4 (four) hours as needed for mild pain., Disp: , Rfl:    metoprolol succinate (TOPROL-XL) 50 MG 24 hr tablet, Take 0.5 tablets (25 mg total) by mouth daily., Disp: , Rfl:    Multiple Vitamins-Minerals (ONE-A-DAY VITACRAVES ADULT) CHEW, Chew 1 tablet by mouth daily., Disp: , Rfl:    traMADol (ULTRAM) 50 MG tablet, Take 50 mg by mouth every 8 (eight) hours as needed for moderate pain., Disp: , Rfl:    warfarin (COUMADIN) 5 MG tablet, Take 1 tablet (5 mg total) by mouth daily. Refer to most recent anticoagulation note for most updated dosing instructions. (Patient taking differently: Take 2.5-5 mg by mouth See admin instructions. 5 mg every Mon; 2.5 mg all other days), Disp: 90 tablet, Rfl: 3 Past Medical History:  Diagnosis Date   CHF (congestive heart failure) (HCC)    Heart failure (HCC)    Hypertension    Morbid obesity (HCC)    Multiple sclerosis (HCC)    Paroxysmal atrial fibrillation ([place])     ASSESSMENT  Recent Results: The most recent result is correlated with 10 mg per week:  Lab Results  Component Value Date   INR  2.1 09/01/2021   INR 1.4 (H) 08/29/2021   INR 1.2 08/28/2021    Anticoagulation Dosing: Description   INR at goal. Decrease weekly dose to 2.5 mg daily. Recheck INR in 1 week     INR today: Therapeutic. Recent labile INR readings. Unsure of recent INR spike. No further recurrence of GI bleeding concerns. Denies any recent changes in diet, medications, or lifestyle. Denies any other complains of bleeding or bruising symptoms. Pt agreeable to maintain Vit K servings of 3 servings/week. Will decrease weekly dose and continue close monitoring and follow up.    PLAN Weekly  dose was decreased by 12.5% to 17.5 mg/week. Decrease weekly dose to 2.5 mg every day. Recheck INR in 1 week. Continue to maintain 3-4 servings of high Vit K/week.   Patient Instructions  INR at goal. Decrease weekly dose to 2.5 mg daily. Recheck INR in 1 week Patient advised to contact clinic or seek medical attention if signs/symptoms of bleeding or thromboembolism occur.  Patient verbalized understanding by repeating back information and was advised to contact me if further medication-related questions arise.   Follow-up Return in about 6 days (around 09/07/2021).  Alysia Penna, PharmD  15 minutes spent face-to-face with the patient during the encounter. 50% of time spent on education, including signs/sx bleeding and clotting, as well as food and drug interactions with warfarin. 50% of time was spent on fingerprick POC INR sample collection,processing, results determination, and documentation

## 2021-09-01 NOTE — Patient Instructions (Signed)
INR at goal. Decrease weekly dose to 2.5 mg daily. Recheck INR in 1 week

## 2021-09-08 ENCOUNTER — Ambulatory Visit: Payer: Self-pay | Admitting: Pharmacist

## 2021-09-08 DIAGNOSIS — Z7901 Long term (current) use of anticoagulants: Secondary | ICD-10-CM

## 2021-09-08 DIAGNOSIS — I480 Paroxysmal atrial fibrillation: Secondary | ICD-10-CM

## 2021-09-08 LAB — POCT INR: INR: 1.9 — AB (ref 2.0–3.0)

## 2021-09-08 NOTE — Patient Instructions (Signed)
INR below goal. Take 5 mg today and then continue taking 2.5 mg daily. Recheck INR in 1 week.

## 2021-09-08 NOTE — Progress Notes (Signed)
Anticoagulation Management Zoe Cobb is a 66 y.o. female who reports to the clinic for monitoring of warfarin treatment.   Indication: atrial fibrillation CHA2DS2 Vasc Score 3 (Age 70-74, Female, CHF hx), HAS-BLED 1 (Age >50)  Duration: indefinite Supervising physician: Adrian Prows  Anticoagulation Clinic Visit History:  Patient does not report signs/symptoms of bleeding or thromboembolism  Other recent changes: Pt maintaining 4-5 serving of high Vit K meals/weeks.   Reports to having Vit K intake of broccoli, Kale, spinach, collard greens on a rotating bases.   Recent diverticular bleeding episode on 08/25/21. Pt hospitalized with acute blood loss anemia. Pt's INR during hospital admission of 4.1. Pt received Kcentra, Vit K and 5 units of PRBC. Hbg improved improved from 6.5 to 8.2 up on discharge. CTA did not show any active hemorrhage. Warfarin was held from 1/19-1/21. Home warfarin dose was restarted on 1/22. Pt had been taking warfarin 2.5 mg daily since. Pt denies any further episodes of GI bleeding concerns since.  Pt reports that she took 5 mg last Monday instead of prescribed 2.5 mg.   Pt still waiting for her wheelchair to be fixed in order to be able to schedule her follow up OV w/ Dr. Einar Gip.   Anticoagulation Episode Summary     Current INR goal:  2.0-3.0  TTR:  52.8 % (2.9 y)  Next INR check:  09/15/2021  INR from last check:  1.9 (09/08/2021)  Weekly max warfarin dose:    Target end date:    INR check location:    Preferred lab:    Send INR reminders to:     Indications   Atrial fibrillation (HCC) [I48.91] Monitoring for long-term anticoagulant use (Resolved) [Z51.81 Z79.01]        Comments:           Allergies  Allergen Reactions   Penicillins Swelling    Has patient had a PCN reaction causing immediate rash, facial/tongue/throat swelling, SOB or lightheadedness with hypotension: Yes Has patient had a PCN reaction causing severe rash involving mucus  membranes or skin necrosis: No Has patient had a PCN reaction that required hospitalization No Has patient had a PCN reaction occurring within the last 10 years: No If all of the above answers are "NO", then may proceed with Cephalosporin use.     Current Outpatient Medications:    acetaminophen (TYLENOL) 325 MG suppository, Place 325 mg rectally every 4 (four) hours as needed for mild pain., Disp: , Rfl:    metoprolol succinate (TOPROL-XL) 50 MG 24 hr tablet, Take 0.5 tablets (25 mg total) by mouth daily., Disp: , Rfl:    Multiple Vitamins-Minerals (ONE-A-DAY VITACRAVES ADULT) CHEW, Chew 1 tablet by mouth daily., Disp: , Rfl:    traMADol (ULTRAM) 50 MG tablet, Take 50 mg by mouth every 8 (eight) hours as needed for moderate pain., Disp: , Rfl:    warfarin (COUMADIN) 5 MG tablet, Take 1 tablet (5 mg total) by mouth daily. Refer to most recent anticoagulation note for most updated dosing instructions. (Patient taking differently: Take 2.5-5 mg by mouth See admin instructions. 5 mg every Mon; 2.5 mg all other days), Disp: 90 tablet, Rfl: 3 Past Medical History:  Diagnosis Date   CHF (congestive heart failure) (HCC)    Heart failure (HCC)    Hypertension    Morbid obesity (HCC)    Multiple sclerosis (HCC)    Paroxysmal atrial fibrillation ([place])     ASSESSMENT  Recent Results: The most recent result is correlated with  20 mg per week:  Lab Results  Component Value Date   INR 1.9 (A) 09/08/2021   INR 2.1 09/01/2021   INR 1.4 (H) 08/29/2021    Anticoagulation Dosing: Description   INR below goal. Take 5 mg today and then continue taking 2.5 mg daily. Recheck INR in 1 week.      INR today: Subtherapeutic. Recent labile INR readings. Recent GI bleeding concerns, but pt denies any further recurrence. Pt reports that she went back to taking 5 mg on Mon and 2.5 mg all other days. Pt was previously supratheraputic on 20 mg/week. Denies any recent changes in diet, medications, or lifestyle.  Denies any other complains of bleeding or bruising symptoms. Pt agreeable to maintain Vit K servings of 3 servings/week. Will boost today and continue close monitoring and follow up. If INR is still subtherapeutic INR at next check, will consider further dose increase.    PLAN Weekly dose was unchanged by 0% to 17.5 mg/week. Take 5 mg today and then continue current weekly dose of 2.5 mg every day. Recheck INR in 1 week. Continue to maintain 3-4 servings of high Vit K/week.   Patient Instructions  INR below goal. Take 5 mg today and then continue taking 2.5 mg daily. Recheck INR in 1 week.  Patient advised to contact clinic or seek medical attention if signs/symptoms of bleeding or thromboembolism occur.  Patient verbalized understanding by repeating back information and was advised to contact me if further medication-related questions arise.   Follow-up Return in about 1 week (around 09/15/2021).  Alysia Penna, PharmD  15 minutes spent face-to-face with the patient during the encounter. 50% of time spent on education, including signs/sx bleeding and clotting, as well as food and drug interactions with warfarin. 50% of time was spent on fingerprick POC INR sample collection,processing, results determination, and documentation

## 2021-09-13 ENCOUNTER — Ambulatory Visit: Payer: 59 | Admitting: Cardiology

## 2021-09-15 ENCOUNTER — Ambulatory Visit: Payer: Self-pay | Admitting: Pharmacist

## 2021-09-15 DIAGNOSIS — Z7901 Long term (current) use of anticoagulants: Secondary | ICD-10-CM

## 2021-09-15 DIAGNOSIS — I480 Paroxysmal atrial fibrillation: Secondary | ICD-10-CM

## 2021-09-15 LAB — POCT INR: INR: 1.8 — AB (ref 2.0–3.0)

## 2021-09-15 NOTE — Patient Instructions (Signed)
INR below goal. Increase weekly dose to 5 mg every Thurs, Sat and 2.5 mg all other days. Recheck INR in 1 week.

## 2021-09-15 NOTE — Progress Notes (Signed)
Anticoagulation Management Zoe Cobb is a 66 y.o. female who reports to the clinic for monitoring of warfarin treatment.   Indication: atrial fibrillation CHA2DS2 Vasc Score 3 (Age 53-74, Female, CHF hx), HAS-BLED 1 (Age >36)  Duration: indefinite Supervising physician: Adrian Prows  Anticoagulation Clinic Visit History:  Patient does not report signs/symptoms of bleeding or thromboembolism  Other recent changes: Pt maintaining 4-5 serving of high Vit K meals/weeks.   Reports to having Vit K intake of broccoli, Kale, spinach, collard greens on a rotating bases.   Recent diverticular bleeding episode on 08/25/21. Pt hospitalized with acute blood loss anemia. Pt's INR during hospital admission of 4.1. Pt received Kcentra, Vit K and 5 units of PRBC. Hbg improved improved from 6.5 to 8.2 up on discharge. CTA did not show any active hemorrhage. Warfarin was held from 1/19-1/21. Home warfarin dose was restarted on 1/22. Pt had been taking warfarin 2.5 mg daily since. Pt denies any further episodes of GI bleeding concerns since.  Pt reports that she took 5 mg last Monday instead of prescribed 2.5 mg.   Pt still waiting for her wheelchair to be fixed in order to be able to schedule her follow up OV w/ Dr. Einar Gip.   Anticoagulation Episode Summary     Current INR goal:  2.0-3.0  TTR:  52.5 % (2.9 y)  Next INR check:  09/22/2021  INR from last check:  1.8 (09/15/2021)  Weekly max warfarin dose:    Target end date:    INR check location:    Preferred lab:    Send INR reminders to:     Indications   Atrial fibrillation (HCC) [I48.91] Monitoring for long-term anticoagulant use (Resolved) [Z51.81 Z79.01]        Comments:           Allergies  Allergen Reactions   Penicillins Swelling    Has patient had a PCN reaction causing immediate rash, facial/tongue/throat swelling, SOB or lightheadedness with hypotension: Yes Has patient had a PCN reaction causing severe rash involving mucus  membranes or skin necrosis: No Has patient had a PCN reaction that required hospitalization No Has patient had a PCN reaction occurring within the last 10 years: No If all of the above answers are "NO", then may proceed with Cephalosporin use.     Current Outpatient Medications:    acetaminophen (TYLENOL) 325 MG suppository, Place 325 mg rectally every 4 (four) hours as needed for mild pain., Disp: , Rfl:    metoprolol succinate (TOPROL-XL) 50 MG 24 hr tablet, Take 0.5 tablets (25 mg total) by mouth daily., Disp: , Rfl:    Multiple Vitamins-Minerals (ONE-A-DAY VITACRAVES ADULT) CHEW, Chew 1 tablet by mouth daily., Disp: , Rfl:    traMADol (ULTRAM) 50 MG tablet, Take 50 mg by mouth every 8 (eight) hours as needed for moderate pain., Disp: , Rfl:    warfarin (COUMADIN) 5 MG tablet, Take 1 tablet (5 mg total) by mouth daily. Refer to most recent anticoagulation note for most updated dosing instructions. (Patient taking differently: Take 2.5-5 mg by mouth See admin instructions. 5 mg every Mon; 2.5 mg all other days), Disp: 90 tablet, Rfl: 3 Past Medical History:  Diagnosis Date   CHF (congestive heart failure) (HCC)    Heart failure (HCC)    Hypertension    Morbid obesity (HCC)    Multiple sclerosis (HCC)    Paroxysmal atrial fibrillation ([place])     ASSESSMENT  Recent Results: The most recent result is correlated with  22.5 mg per week:  Lab Results  Component Value Date   INR 1.8 (A) 09/15/2021   INR 1.9 (A) 09/08/2021   INR 2.1 09/01/2021    Anticoagulation Dosing: Description   INR below goal. Increase weekly dose to 5 mg every Thurs, Sat and 2.5 mg all other days. Recheck INR in 1 week.      INR today: Subtherapeutic. Recent labile INR readings. INR continues to be subtherapeutic on weekly dose of 20 mg/week. No recurrence of GI bleeding concerns since. Denies any recent changes in diet, medications, or lifestyle. Denies any other complains of bleeding or bruising symptoms. Pt  agreeable to maintain Vit K servings of 3 servings/week. Will increase weekly dose in setting of persistently subtherapeutic INR readings and continue close monitoring and follow up.    PLAN Weekly dose was unchanged by 28.6% to 22.5 mg/week. Increase weekly dose to 5 mg every Thurs, Sat and 2.5 mg all other days. Continue to maintain 3-4 servings of high Vit K/week. Recheck INR in 1 week.  Patient Instructions  INR below goal. Increase weekly dose to 5 mg every Thurs, Sat and 2.5 mg all other days. Recheck INR in 1 week.  Patient advised to contact clinic or seek medical attention if signs/symptoms of bleeding or thromboembolism occur.  Patient verbalized understanding by repeating back information and was advised to contact me if further medication-related questions arise.   Follow-up Return in about 1 week (around 09/22/2021).  Alysia Penna, PharmD  15 minutes spent face-to-face with the patient during the encounter. 50% of time spent on education, including signs/sx bleeding and clotting, as well as food and drug interactions with warfarin. 50% of time was spent on fingerprick POC INR sample collection,processing, results determination, and documentation

## 2021-09-22 ENCOUNTER — Ambulatory Visit: Payer: Self-pay | Admitting: Pharmacist

## 2021-09-22 DIAGNOSIS — Z7901 Long term (current) use of anticoagulants: Secondary | ICD-10-CM

## 2021-09-22 DIAGNOSIS — I480 Paroxysmal atrial fibrillation: Secondary | ICD-10-CM

## 2021-09-22 LAB — POCT INR: INR: 1.8 — AB (ref 2.0–3.0)

## 2021-09-22 NOTE — Patient Instructions (Signed)
INR below goal. Increase weekly dose to 5 mg every Tues, Thurs, Sat and 2.5 mg all other days. Recheck INR in 1 week.  °

## 2021-09-22 NOTE — Progress Notes (Signed)
Anticoagulation Management Zoe Cobb is a 66 y.o. female who reports to the clinic for monitoring of warfarin treatment.   Indication: atrial fibrillation CHA2DS2 Vasc Score 3 (Age 7-74, Female, CHF hx), HAS-BLED 1 (Age >39)  Duration: indefinite Supervising physician: Adrian Prows  Anticoagulation Clinic Visit History:  Patient does not report signs/symptoms of bleeding or thromboembolism  Other recent changes: Pt maintaining 4-5 serving of high Vit K meals/weeks.   Reports to having Vit K intake of broccoli, Kale, spinach, collard greens on a rotating bases.   Recent diverticular bleeding episode on 08/25/21. Pt hospitalized with acute blood loss anemia. Pt's INR during hospital admission of 4.1. Pt received Kcentra, Vit K and 5 units of PRBC. Hbg improved improved from 6.5 to 8.2 up on discharge. CTA did not show any active hemorrhage. Warfarin was held from 1/19-1/21. Pt denies any further episodes of GI bleeding concerns since.  Pt still waiting for her wheelchair to be fixed in order to be able to schedule her follow up OV w/ Dr. Einar Gip.   Anticoagulation Episode Summary     Current INR goal:  2.0-3.0  TTR:  52.1 % (2.9 y)  Next INR check:  09/29/2021  INR from last check:  1.8 (09/22/2021)  Weekly max warfarin dose:    Target end date:    INR check location:    Preferred lab:    Send INR reminders to:     Indications   Atrial fibrillation (HCC) [I48.91] Monitoring for long-term anticoagulant use (Resolved) [Z51.81 Z79.01]        Comments:           Allergies  Allergen Reactions   Penicillins Swelling    Has patient had a PCN reaction causing immediate rash, facial/tongue/throat swelling, SOB or lightheadedness with hypotension: Yes Has patient had a PCN reaction causing severe rash involving mucus membranes or skin necrosis: No Has patient had a PCN reaction that required hospitalization No Has patient had a PCN reaction occurring within the last 10 years:  No If all of the above answers are "NO", then may proceed with Cephalosporin use.     Current Outpatient Medications:    acetaminophen (TYLENOL) 325 MG suppository, Place 325 mg rectally every 4 (four) hours as needed for mild pain., Disp: , Rfl:    metoprolol succinate (TOPROL-XL) 50 MG 24 hr tablet, Take 0.5 tablets (25 mg total) by mouth daily., Disp: , Rfl:    Multiple Vitamins-Minerals (ONE-A-DAY VITACRAVES ADULT) CHEW, Chew 1 tablet by mouth daily., Disp: , Rfl:    traMADol (ULTRAM) 50 MG tablet, Take 50 mg by mouth every 8 (eight) hours as needed for moderate pain., Disp: , Rfl:    warfarin (COUMADIN) 5 MG tablet, Take 1 tablet (5 mg total) by mouth daily. Refer to most recent anticoagulation note for most updated dosing instructions. (Patient taking differently: Take 2.5-5 mg by mouth See admin instructions. 5 mg every Mon; 2.5 mg all other days), Disp: 90 tablet, Rfl: 3 Past Medical History:  Diagnosis Date   CHF (congestive heart failure) (HCC)    Heart failure (HCC)    Hypertension    Morbid obesity (HCC)    Multiple sclerosis (HCC)    Paroxysmal atrial fibrillation ([place])     ASSESSMENT  Recent Results: The most recent result is correlated with 22.5 mg per week:  Lab Results  Component Value Date   INR 1.8 (A) 09/22/2021   INR 1.8 (A) 09/15/2021   INR 1.9 (A) 09/08/2021  Anticoagulation Dosing: Description   INR below goal. Increase weekly dose to 5 mg every Tues, Thurs, Sat and 2.5 mg all other days. Recheck INR in 1 week.      INR today: Subtherapeutic. INR continues to remain subtherapeutic following recent weekly dose increase. No recurrence of GI bleeding concerns since. Denies any recent changes in diet, medications, or lifestyle. Denies any other complains of bleeding or bruising symptoms. Pt agreeable to maintain Vit K servings of 3 servings/week. Will continue further increasing weekly dose in setting of persistently subtherapeutic INR readings and continue  close monitoring and follow up.    PLAN Weekly dose was increased by 11.1% to 25 mg/week. Increase weekly dose to 5 mg every Tues, Thurs, Sat and 2.5 mg all other days. Continue to maintain 3-4 servings of high Vit K/week. Recheck INR in 1 week.  Patient Instructions  INR below goal. Increase weekly dose to 5 mg every Tues, Thurs, Sat and 2.5 mg all other days. Recheck INR in 1 week.  Patient advised to contact clinic or seek medical attention if signs/symptoms of bleeding or thromboembolism occur.  Patient verbalized understanding by repeating back information and was advised to contact me if further medication-related questions arise.   Follow-up Return in about 1 week (around 09/29/2021).  Alysia Penna, PharmD  15 minutes spent face-to-face with the patient during the encounter. 50% of time spent on education, including signs/sx bleeding and clotting, as well as food and drug interactions with warfarin. 50% of time was spent on fingerprick POC INR sample collection,processing, results determination, and documentation

## 2021-09-29 ENCOUNTER — Ambulatory Visit: Payer: Self-pay | Admitting: Pharmacist

## 2021-09-29 DIAGNOSIS — Z7901 Long term (current) use of anticoagulants: Secondary | ICD-10-CM

## 2021-09-29 DIAGNOSIS — I480 Paroxysmal atrial fibrillation: Secondary | ICD-10-CM

## 2021-09-29 LAB — POCT INR: INR: 1.9 — AB (ref 2.0–3.0)

## 2021-09-29 NOTE — Progress Notes (Signed)
Anticoagulation Management Zoe Cobb is a 66 y.o. female who reports to the clinic for monitoring of warfarin treatment.   Indication: atrial fibrillation CHA2DS2 Vasc Score 3 (Age 34-74, Female, CHF hx), HAS-BLED 1 (Age >49)  Duration: indefinite Supervising physician: Adrian Prows  Anticoagulation Clinic Visit History:  Patient does not report signs/symptoms of bleeding or thromboembolism  Other recent changes: Pt maintaining 4-5 serving of high Vit K meals/weeks.   Reports to having Vit K intake of broccoli, Kale, spinach, collard greens on a rotating bases.   Recent diverticular bleeding episode on 08/25/21. Pt hospitalized with acute blood loss anemia. Pt's INR during hospital admission of 4.1. Pt received Kcentra, Vit K and 5 units of PRBC. Hbg improved improved from 6.5 to 8.2 up on discharge. CTA did not show any active hemorrhage. Warfarin was held from 1/19-1/21. Pt denies any further episodes of GI bleeding concerns since.  Pt still waiting for her wheelchair to be fixed in order to be able to schedule her follow up OV w/ Dr. Einar Gip.   Anticoagulation Episode Summary     Current INR goal:  2.0-3.0  TTR:  51.7 % (2.9 y)  Next INR check:  10/06/2021  INR from last check:  1.9 (09/29/2021)  Weekly max warfarin dose:    Target end date:    INR check location:    Preferred lab:    Send INR reminders to:     Indications   Atrial fibrillation (HCC) [I48.91] Monitoring for long-term anticoagulant use (Resolved) [Z51.81 Z79.01]        Comments:           Allergies  Allergen Reactions   Penicillins Swelling    Has patient had a PCN reaction causing immediate rash, facial/tongue/throat swelling, SOB or lightheadedness with hypotension: Yes Has patient had a PCN reaction causing severe rash involving mucus membranes or skin necrosis: No Has patient had a PCN reaction that required hospitalization No Has patient had a PCN reaction occurring within the last 10 years:  No If all of the above answers are "NO", then may proceed with Cephalosporin use.     Current Outpatient Medications:    acetaminophen (TYLENOL) 325 MG suppository, Place 325 mg rectally every 4 (four) hours as needed for mild pain., Disp: , Rfl:    metoprolol succinate (TOPROL-XL) 50 MG 24 hr tablet, Take 0.5 tablets (25 mg total) by mouth daily., Disp: , Rfl:    Multiple Vitamins-Minerals (ONE-A-DAY VITACRAVES ADULT) CHEW, Chew 1 tablet by mouth daily., Disp: , Rfl:    traMADol (ULTRAM) 50 MG tablet, Take 50 mg by mouth every 8 (eight) hours as needed for moderate pain., Disp: , Rfl:    warfarin (COUMADIN) 5 MG tablet, Take 1 tablet (5 mg total) by mouth daily. Refer to most recent anticoagulation note for most updated dosing instructions. (Patient taking differently: Take 2.5-5 mg by mouth See admin instructions. 5 mg every Mon; 2.5 mg all other days), Disp: 90 tablet, Rfl: 3 Past Medical History:  Diagnosis Date   CHF (congestive heart failure) (HCC)    Heart failure (HCC)    Hypertension    Morbid obesity (HCC)    Multiple sclerosis (HCC)    Paroxysmal atrial fibrillation ([place])     ASSESSMENT  Recent Results: The most recent result is correlated with 25 mg per week:  Lab Results  Component Value Date   INR 1.9 (A) 09/29/2021   INR 1.8 (A) 09/22/2021   INR 1.8 (A) 09/15/2021  Anticoagulation Dosing: Description   INR below goal. Increase weekly dose to 2.5 mg every Mon, Wed, Fri and 5 mg all other days. Recheck INR in 1 week.      INR today: Subtherapeutic. INR continues to remain subtherapeutic despite recent weekly dose increase. No recurrence of GI bleeding concerns since. Denies any recent changes in diet, medications, or lifestyle. Denies any other complains of bleeding or bruising symptoms. Pt agreeable to maintain Vit K servings of 3 servings/week. Will continue further increasing weekly dose in setting of persistently subtherapeutic INR readings and continue close  monitoring and follow up.    PLAN Weekly dose was increased by 10% to 27.5 mg/week. Increase weekly dose to 2.5 mg every Mon, Wed, Fri and 5 mg all other days. Continue to maintain 3-4 servings of high Vit K/week. Recheck INR in 1 week.  Patient Instructions  INR below goal. Increase weekly dose to 2.5 mg every Mon, Wed, Fri and 5 mg all other days. Recheck INR in 1 week.  Patient advised to contact clinic or seek medical attention if signs/symptoms of bleeding or thromboembolism occur.  Patient verbalized understanding by repeating back information and was advised to contact me if further medication-related questions arise.   Follow-up Return in about 1 week (around 10/06/2021).  Alysia Penna, PharmD  15 minutes spent face-to-face with the patient during the encounter. 50% of time spent on education, including signs/sx bleeding and clotting, as well as food and drug interactions with warfarin. 50% of time was spent on fingerprick POC INR sample collection,processing, results determination, and documentation

## 2021-09-29 NOTE — Patient Instructions (Signed)
INR below goal. Increase weekly dose to 2.5 mg every Mon, Wed, Fri and 5 mg all other days. Recheck INR in 1 week ?

## 2021-10-06 ENCOUNTER — Ambulatory Visit: Payer: Self-pay | Admitting: Pharmacist

## 2021-10-06 DIAGNOSIS — I480 Paroxysmal atrial fibrillation: Secondary | ICD-10-CM

## 2021-10-06 DIAGNOSIS — Z7901 Long term (current) use of anticoagulants: Secondary | ICD-10-CM

## 2021-10-06 LAB — POCT INR: INR: 1.8 — AB (ref 2.0–3.0)

## 2021-10-06 NOTE — Patient Instructions (Signed)
INR below goal. Retry previous weekly dose of 2.5 mg every Mon, Wed, Fri and 5 mg all other days. Recheck INR in 1 week.  ?

## 2021-10-06 NOTE — Progress Notes (Signed)
Anticoagulation Management ?Zoe [place] is a 66 y.o. female who reports to the clinic for monitoring of warfarin treatment.  ? ?Indication: atrial fibrillation CHA2DS2 Vasc Score 3 (Age 68-74, Female, CHF hx), HAS-BLED 1 (Age >58)  ?Duration: indefinite ?Supervising physician: Adrian Prows ? ?Anticoagulation Clinic Visit History: ? ?Patient does not report signs/symptoms of bleeding or thromboembolism ? ?Other recent changes: Pt maintaining 4-5 serving of high Vit K meals/weeks.  ? ?Reports to having Vit K intake of broccoli, Kale, spinach, collard greens on a rotating bases.  ? ?Recent diverticular bleeding episode on 08/25/21. Pt hospitalized with acute blood loss anemia. Pt's INR during hospital admission of 4.1. Pt received Kcentra, Vit K and 5 units of PRBC. Hbg improved improved from 6.5 to 8.2 up on discharge. CTA did not show any active hemorrhage. Warfarin was held from 1/19-1/21. Pt denies any further episodes of GI bleeding concerns since. ? ?Pt still waiting for her wheelchair to be fixed in order to be able to schedule her follow up OV w/ Dr. Einar Gip.  ? ?Pt reports that she took 2.5 mg on Sunday instead of previously discussed 5 mg.  ? ?Anticoagulation Episode Summary   ? ? Current INR goal:  2.0-3.0  ?TTR:  51.4 % (2.9 y)  ?Next INR check:  10/13/2021  ?INR from last check:  1.8 (10/06/2021)  ?Weekly max warfarin dose:    ?Target end date:    ?INR check location:    ?Preferred lab:    ?Send INR reminders to:    ? Indications   ?Atrial fibrillation ([place]) [I48.91] ?Monitoring for long-term anticoagulant use (Resolved) [Z51.81 ?Z79.01] ? ?  ?  ? ? Comments:    ?  ? ?  ? ? ?Allergies  ?Allergen Reactions  ? Penicillins Swelling  ?  Has patient had a PCN reaction causing immediate rash, facial/tongue/throat swelling, SOB or lightheadedness with hypotension: Yes ?Has patient had a PCN reaction causing severe rash involving mucus membranes or skin necrosis: No ?Has patient had a PCN reaction that required  hospitalization No ?Has patient had a PCN reaction occurring within the last 10 years: No ?If all of the above answers are "NO", then may proceed with Cephalosporin use. ?  ? ? ?Current Outpatient Medications:  ?  acetaminophen (TYLENOL) 325 MG suppository, Place 325 mg rectally every 4 (four) hours as needed for mild pain., Disp: , Rfl:  ?  metoprolol succinate (TOPROL-XL) 50 MG 24 hr tablet, Take 0.5 tablets (25 mg total) by mouth daily., Disp: , Rfl:  ?  Multiple Vitamins-Minerals (ONE-A-DAY VITACRAVES ADULT) CHEW, Chew 1 tablet by mouth daily., Disp: , Rfl:  ?  traMADol (ULTRAM) 50 MG tablet, Take 50 mg by mouth every 8 (eight) hours as needed for moderate pain., Disp: , Rfl:  ?  warfarin (COUMADIN) 5 MG tablet, Take 1 tablet (5 mg total) by mouth daily. Refer to most recent anticoagulation note for most updated dosing instructions. (Patient taking differently: Take 2.5-5 mg by mouth See admin instructions. 5 mg every Mon; 2.5 mg all other days), Disp: 90 tablet, Rfl: 3 ?Past Medical History:  ?Diagnosis Date  ? CHF (congestive heart failure) ([place])   ? Heart failure ([place])   ? Hypertension   ? Morbid obesity ([place])   ? Multiple sclerosis ([place])   ? Paroxysmal atrial fibrillation (HCC)   ? ? ?ASSESSMENT ? ?Recent Results: ?The most recent result is correlated with 25 mg per week: ? ?Lab Results  ?Component Value Date  ? INR 1.8 (  A) 10/06/2021  ? INR 1.9 (A) 09/29/2021  ? INR 1.8 (A) 09/22/2021  ? ? ?Anticoagulation Dosing: ?Description   ?INR below goal. Retry previous weekly dose of 2.5 mg every Mon, Wed, Fri and 5 mg all other days. Recheck INR in 1 week.  ?  ?  ?INR today: Subtherapeutic. INR subtherapeutic in setting lower than intended weekly warfarin dose intake. Pt verbalized understanding to recent weekly dose increase to weekly dose of 27.5 mg/week. No recurrence of GI bleeding concerns since. Denies any recent changes in diet, medications, or lifestyle. Denies any other complains of bleeding or bruising  symptoms. Pt agreeable to maintain Vit K servings of 3 servings/week. Will retry previously discussed weekly dose increase and continue close monitoring and follow up.  ?  ?PLAN ?Weekly dose was unchanged by 0% to 27.5 mg/week. Retry weekly dose of 2.5 mg every Mon, Wed, Fri and 5 mg all other days. Continue to maintain 3-4 servings of high Vit K/week. Recheck INR in 1 week. ? ?Patient Instructions  ?INR below goal. Retry previous weekly dose of 2.5 mg every Mon, Wed, Fri and 5 mg all other days. Recheck INR in 1 week.  ?Patient advised to contact clinic or seek medical attention if signs/symptoms of bleeding or thromboembolism occur. ? ?Patient verbalized understanding by repeating back information and was advised to contact me if further medication-related questions arise.  ? ?Follow-up ?Return in about 1 week (around 10/13/2021). ? ?Alysia Penna, PharmD ? ?15 minutes spent face-to-face with the patient during the encounter. 50% of time spent on education, including signs/sx bleeding and clotting, as well as food and drug interactions with warfarin. 50% of time was spent on fingerprick POC INR sample collection,processing, results determination, and documentation ?

## 2021-10-13 ENCOUNTER — Ambulatory Visit: Payer: Self-pay | Admitting: Pharmacist

## 2021-10-13 DIAGNOSIS — Z7901 Long term (current) use of anticoagulants: Secondary | ICD-10-CM

## 2021-10-13 DIAGNOSIS — I480 Paroxysmal atrial fibrillation: Secondary | ICD-10-CM

## 2021-10-13 LAB — POCT INR: INR: 2.1 (ref 2.0–3.0)

## 2021-10-13 NOTE — Patient Instructions (Signed)
INR at goal. Continue current weekly dose of 2.5 mg every Mon, Wed, Fri and 5 mg all other days. Recheck INR in 1 week.  °

## 2021-10-13 NOTE — Progress Notes (Signed)
Anticoagulation Management ?Zoe [place] is a 66 y.o. female who reports to the clinic for monitoring of warfarin treatment.  ? ?Indication: atrial fibrillation CHA2DS2 Vasc Score 3 (Age 34-74, Female, CHF hx), HAS-BLED 1 (Age >44)  ?Duration: indefinite ?Supervising physician: Adrian Prows ? ?Anticoagulation Clinic Visit History: ? ?Patient does not report signs/symptoms of bleeding or thromboembolism ? ?Other recent changes: Pt maintaining 4-5 serving of high Vit K meals/weeks.  ? ?Reports to having Vit K intake of broccoli, Kale, spinach, collard greens on a rotating bases.  ? ?Recent diverticular bleeding episode on 08/25/21. Pt hospitalized with acute blood loss anemia. Pt's INR during hospital admission of 4.1. Pt received Kcentra, Vit K and 5 units of PRBC. Hbg improved improved from 6.5 to 8.2 up on discharge. CTA did not show any active hemorrhage. Warfarin was held from 1/19-1/21. Pt denies any further episodes of GI bleeding concerns since. ? ?Pt still waiting for her wheelchair to be fixed in order to be able to schedule her follow up OV w/ Dr. Einar Gip.  ? ?Anticoagulation Episode Summary   ? ? Current INR goal:  2.0-3.0  ?TTR:  51.3 % (3 y)  ?Next INR check:  10/20/2021  ?INR from last check:  2.1 (10/13/2021)  ?Weekly max warfarin dose:    ?Target end date:    ?INR check location:    ?Preferred lab:    ?Send INR reminders to:    ? Indications   ?Atrial fibrillation ([place]) [I48.91] ?Monitoring for long-term anticoagulant use (Resolved) [Z51.81 ?Z79.01] ? ?  ?  ? ? Comments:    ?  ? ?  ? ? ?Allergies  ?Allergen Reactions  ? Penicillins Swelling  ?  Has patient had a PCN reaction causing immediate rash, facial/tongue/throat swelling, SOB or lightheadedness with hypotension: Yes ?Has patient had a PCN reaction causing severe rash involving mucus membranes or skin necrosis: No ?Has patient had a PCN reaction that required hospitalization No ?Has patient had a PCN reaction occurring within the last 10 years:  No ?If all of the above answers are "NO", then may proceed with Cephalosporin use. ?  ? ? ?Current Outpatient Medications:  ?  acetaminophen (TYLENOL) 325 MG suppository, Place 325 mg rectally every 4 (four) hours as needed for mild pain., Disp: , Rfl:  ?  metoprolol succinate (TOPROL-XL) 50 MG 24 hr tablet, Take 0.5 tablets (25 mg total) by mouth daily., Disp: , Rfl:  ?  Multiple Vitamins-Minerals (ONE-A-DAY VITACRAVES ADULT) CHEW, Chew 1 tablet by mouth daily., Disp: , Rfl:  ?  traMADol (ULTRAM) 50 MG tablet, Take 50 mg by mouth every 8 (eight) hours as needed for moderate pain., Disp: , Rfl:  ?  warfarin (COUMADIN) 5 MG tablet, Take 1 tablet (5 mg total) by mouth daily. Refer to most recent anticoagulation note for most updated dosing instructions. (Patient taking differently: Take 2.5-5 mg by mouth See admin instructions. 5 mg every Mon; 2.5 mg all other days), Disp: 90 tablet, Rfl: 3 ?Past Medical History:  ?Diagnosis Date  ? CHF (congestive heart failure) ([place])   ? Heart failure ([place])   ? Hypertension   ? Morbid obesity ([place])   ? Multiple sclerosis ([place])   ? Paroxysmal atrial fibrillation (HCC)   ? ? ?ASSESSMENT ? ?Recent Results: ?The most recent result is correlated with 25 mg per week: ? ?Lab Results  ?Component Value Date  ? INR 2.1 10/13/2021  ? INR 1.8 (A) 10/06/2021  ? INR 1.9 (A) 09/29/2021  ? ? ?Anticoagulation  Dosing: ?Description   ?INR at goal. Continue current weekly dose of 2.5 mg every Mon, Wed, Fri and 5 mg all other days. Recheck INR in 1 week.  ?  ?  ?INR today: Therapeutic. INR improved since pt returning back to weekly dose of 27.5 mg/week. No recurrence of GI bleeding concerns since. Denies any recent changes in diet, medications, or lifestyle. Denies any other complains of bleeding or bruising symptoms. Pt agreeable to maintain Vit K servings of 3 servings/week. Will retry previously discussed weekly dose increase and continue close monitoring and follow up.  ?  ?PLAN ?Weekly dose was  unchanged by 0% to 27.5 mg/week. Continue current weekly dose of 2.5 mg every Mon, Wed, Fri and 5 mg all other days. Continue to maintain 3-4 servings of high Vit K/week. Recheck INR in 1 week. ? ?Patient Instructions  ?INR at goal. Continue current weekly dose of 2.5 mg every Mon, Wed, Fri and 5 mg all other days. Recheck INR in 1 week.  ?Patient advised to contact clinic or seek medical attention if signs/symptoms of bleeding or thromboembolism occur. ? ?Patient verbalized understanding by repeating back information and was advised to contact me if further medication-related questions arise.  ? ?Follow-up ?Return in about 1 week (around 10/20/2021). ? ?Alysia Penna, PharmD ? ?15 minutes spent face-to-face with the patient during the encounter. 50% of time spent on education, including signs/sx bleeding and clotting, as well as food and drug interactions with warfarin. 50% of time was spent on fingerprick POC INR sample collection,processing, results determination, and documentation ?

## 2021-10-27 ENCOUNTER — Ambulatory Visit: Payer: Self-pay | Admitting: Pharmacist

## 2021-10-27 DIAGNOSIS — I480 Paroxysmal atrial fibrillation: Secondary | ICD-10-CM

## 2021-10-27 DIAGNOSIS — Z5181 Encounter for therapeutic drug level monitoring: Secondary | ICD-10-CM

## 2021-10-27 LAB — POCT INR: INR: 3.2 — AB (ref 2.0–3.0)

## 2021-10-27 NOTE — Patient Instructions (Signed)
INR above goal. Decrease weekly dose to 2.5 mg every Mon, Wed, Fri and 5 mg all other days. Recheck INR in 1 week.  ?

## 2021-10-27 NOTE — Progress Notes (Signed)
Anticoagulation Management ?Aravis Sergeant is a 66 y.o. female who reports to the clinic for monitoring of warfarin treatment.  ? ?Indication: atrial fibrillation CHA2DS2 Vasc Score 3 (Age 63-74, Female, CHF hx), HAS-BLED 1 (Age >92)  ?Duration: indefinite ?Supervising physician: Adrian Prows ? ?Anticoagulation Clinic Visit History: ? ?Patient does not report signs/symptoms of bleeding or thromboembolism ? ?Other recent changes: Pt maintaining 4-5 serving of high Vit K meals/weeks.  ? ?Reports to having Vit K intake of broccoli, Kale, spinach, collard greens on a rotating bases.  ? ?Recent diverticular bleeding episode on 08/25/21. Pt hospitalized with acute blood loss anemia. Pt's INR during hospital admission of 4.1. Pt received Kcentra, Vit K and 5 units of PRBC. Hbg improved improved from 6.5 to 8.2 up on discharge. CTA did not show any active hemorrhage. Warfarin was held from 1/19-1/21. Pt denies any further episodes of GI bleeding concerns since. ? ?Pt still waiting for her wheelchair to be fixed in order to be able to schedule her follow up OV w/ Dr. Einar Gip.  ? ?Anticoagulation Episode Summary   ? ? Current INR goal:  2.0-3.0  ?TTR:  51.7 % (3 y)  ?Next INR check:  11/03/2021  ?INR from last check:  3.2 (10/27/2021)  ?Weekly max warfarin dose:    ?Target end date:    ?INR check location:    ?Preferred lab:    ?Send INR reminders to:    ? Indications   ?Atrial fibrillation ([place]) [I48.91] ?Monitoring for long-term anticoagulant use (Resolved) [Z51.81 ?Z79.01] ? ?  ?  ? ? Comments:    ?  ? ?  ? ? ?Allergies  ?Allergen Reactions  ? Penicillins Swelling  ?  Has patient had a PCN reaction causing immediate rash, facial/tongue/throat swelling, SOB or lightheadedness with hypotension: Yes ?Has patient had a PCN reaction causing severe rash involving mucus membranes or skin necrosis: No ?Has patient had a PCN reaction that required hospitalization No ?Has patient had a PCN reaction occurring within the last 10 years:  No ?If all of the above answers are "NO", then may proceed with Cephalosporin use. ?  ? ? ?Current Outpatient Medications:  ?  acetaminophen (TYLENOL) 325 MG suppository, Place 325 mg rectally every 4 (four) hours as needed for mild pain., Disp: , Rfl:  ?  metoprolol succinate (TOPROL-XL) 50 MG 24 hr tablet, Take 0.5 tablets (25 mg total) by mouth daily., Disp: , Rfl:  ?  Multiple Vitamins-Minerals (ONE-A-DAY VITACRAVES ADULT) CHEW, Chew 1 tablet by mouth daily., Disp: , Rfl:  ?  traMADol (ULTRAM) 50 MG tablet, Take 50 mg by mouth every 8 (eight) hours as needed for moderate pain., Disp: , Rfl:  ?  warfarin (COUMADIN) 5 MG tablet, Take 1 tablet (5 mg total) by mouth daily. Refer to most recent anticoagulation note for most updated dosing instructions. (Patient taking differently: Take 2.5-5 mg by mouth See admin instructions. 5 mg every Mon; 2.5 mg all other days), Disp: 90 tablet, Rfl: 3 ?Past Medical History:  ?Diagnosis Date  ? CHF (congestive heart failure) ([place])   ? Heart failure ([place])   ? Hypertension   ? Morbid obesity ([place])   ? Multiple sclerosis ([place])   ? Paroxysmal atrial fibrillation (H[facility])   ? ? ?ASSESSMENT ? ?Recent Results: ?The most recent result is correlated with 27.5 mg per week: ? ?Lab Results  ?Component Value Date  ? INR 3.2 (A) 10/27/2021  ? INR 2.1 10/13/2021  ? INR 1.8 (A) 10/06/2021  ? ? ?Anticoagulation  Dosing: ?Description   ?INR above goal. Decrease weekly dose to 2.5 mg every Mon, Wed, Fri and 5 mg all other days. Recheck INR in 1 week.  ?  ?  ?INR today: Supratherapeutic. INR continues to trend up significantly over the past three weeks on 27.5 mg/week. No recurrence of GI bleeding concerns since. Denies any recent changes in diet, medications, or lifestyle. Denies any other complains of bleeding or bruising symptoms. Pt agreeable to maintain Vit K servings of 3 servings/week. Will decrease weekly dose and continue close monitoring and follow up closely.  ?  ?PLAN ?Weekly dose was  decreased by 9.1% to 25 mg/week. Decrease weekly dose to 5 mg every Sun, Tues, Thurs, 2.5 mg all other days. Continue to maintain 3-4 servings of high Vit K/week. Recheck INR in 1 week. ? ?Patient Instructions  ?INR above goal. Decrease weekly dose to 2.5 mg every Mon, Wed, Fri and 5 mg all other days. Recheck INR in 1 week.  ?Patient advised to contact clinic or seek medical attention if signs/symptoms of bleeding or thromboembolism occur. ? ?Patient verbalized understanding by repeating back information and was advised to contact me if further medication-related questions arise.  ? ?Follow-up ?Return in about 1 week (around 11/03/2021). ? ?Alysia Penna, PharmD ? ?15 minutes spent face-to-face with the patient during the encounter. 50% of time spent on education, including signs/sx bleeding and clotting, as well as food and drug interactions with warfarin. 50% of time was spent on fingerprick POC INR sample collection,processing, results determination, and documentation ?

## 2021-11-01 ENCOUNTER — Emergency Department (HOSPITAL_COMMUNITY): Payer: 59

## 2021-11-01 ENCOUNTER — Encounter (HOSPITAL_COMMUNITY): Payer: Self-pay

## 2021-11-01 ENCOUNTER — Other Ambulatory Visit: Payer: Self-pay

## 2021-11-01 ENCOUNTER — Observation Stay (HOSPITAL_COMMUNITY)
Admission: EM | Admit: 2021-11-01 | Discharge: 2021-11-03 | Disposition: A | Discharge status: Home / Self Care | Payer: 59 | Attending: Family Medicine | Admitting: Family Medicine

## 2021-11-01 ENCOUNTER — Observation Stay: Payer: Self-pay

## 2021-11-01 DIAGNOSIS — I10 Essential (primary) hypertension: Secondary | ICD-10-CM | POA: Diagnosis present

## 2021-11-01 DIAGNOSIS — Z6841 Body Mass Index (BMI) 40.0 and over, adult: Secondary | ICD-10-CM | POA: Insufficient documentation

## 2021-11-01 DIAGNOSIS — E8809 Other disorders of plasma-protein metabolism, not elsewhere classified: Secondary | ICD-10-CM | POA: Diagnosis present

## 2021-11-01 DIAGNOSIS — Z87891 Personal history of nicotine dependence: Secondary | ICD-10-CM | POA: Diagnosis not present

## 2021-11-01 DIAGNOSIS — Z79899 Other long term (current) drug therapy: Secondary | ICD-10-CM | POA: Insufficient documentation

## 2021-11-01 DIAGNOSIS — I5032 Chronic diastolic (congestive) heart failure: Secondary | ICD-10-CM | POA: Diagnosis not present

## 2021-11-01 DIAGNOSIS — Z7901 Long term (current) use of anticoagulants: Secondary | ICD-10-CM

## 2021-11-01 DIAGNOSIS — N950 Postmenopausal bleeding: Secondary | ICD-10-CM | POA: Diagnosis not present

## 2021-11-01 DIAGNOSIS — I480 Paroxysmal atrial fibrillation: Secondary | ICD-10-CM

## 2021-11-01 DIAGNOSIS — D62 Acute posthemorrhagic anemia: Secondary | ICD-10-CM | POA: Diagnosis not present

## 2021-11-01 DIAGNOSIS — N939 Abnormal uterine and vaginal bleeding, unspecified: Secondary | ICD-10-CM | POA: Diagnosis present

## 2021-11-01 DIAGNOSIS — Z5181 Encounter for therapeutic drug level monitoring: Secondary | ICD-10-CM

## 2021-11-01 DIAGNOSIS — D649 Anemia, unspecified: Secondary | ICD-10-CM

## 2021-11-01 DIAGNOSIS — I110 Hypertensive heart disease with heart failure: Secondary | ICD-10-CM | POA: Diagnosis not present

## 2021-11-01 LAB — CBC
HCT: 25.7 % — ABNORMAL LOW (ref 36.0–46.0)
Hemoglobin: 7.4 g/dL — ABNORMAL LOW (ref 12.0–15.0)
MCH: 21.4 pg — ABNORMAL LOW (ref 26.0–34.0)
MCHC: 28.8 g/dL — ABNORMAL LOW (ref 30.0–36.0)
MCV: 74.3 fL — ABNORMAL LOW (ref 80.0–100.0)
Platelets: 376 10*3/uL (ref 150–400)
RBC: 3.46 MIL/uL — ABNORMAL LOW (ref 3.87–5.11)
RDW: 20.4 % — ABNORMAL HIGH (ref 11.5–15.5)
WBC: 7.2 10*3/uL (ref 4.0–10.5)
nRBC: 0 % (ref 0.0–0.2)

## 2021-11-01 LAB — COMPREHENSIVE METABOLIC PANEL
ALT: 15 U/L (ref 0–44)
AST: 36 U/L (ref 15–41)
Albumin: 2.3 g/dL — ABNORMAL LOW (ref 3.5–5.0)
Alkaline Phosphatase: 106 U/L (ref 38–126)
Anion gap: 3 — ABNORMAL LOW (ref 5–15)
BUN: 24 mg/dL — ABNORMAL HIGH (ref 8–23)
CO2: 29 mmol/L (ref 22–32)
Calcium: 7.9 mg/dL — ABNORMAL LOW (ref 8.9–10.3)
Chloride: 105 mmol/L (ref 98–111)
Creatinine, Ser: 0.72 mg/dL (ref 0.44–1.00)
GFR, Estimated: >60 mL/min (ref >60)
Glucose, Bld: 105 mg/dL — ABNORMAL HIGH (ref 70–99)
Potassium: 4.5 mmol/L (ref 3.5–5.1)
Sodium: 137 mmol/L (ref 135–145)
Total Bilirubin: 0.9 mg/dL (ref 0.3–1.2)
Total Protein: 8.7 g/dL — ABNORMAL HIGH (ref 6.5–8.1)

## 2021-11-01 LAB — PROTIME-INR
INR: 3.5 — ABNORMAL HIGH (ref 0.8–1.2)
INR: 1.6 — ABNORMAL HIGH (ref 0.8–1.2)
Prothrombin Time: 35.2 seconds — ABNORMAL HIGH (ref 11.4–15.2)
Prothrombin Time: 19.5 seconds — ABNORMAL HIGH (ref 11.4–15.2)

## 2021-11-01 LAB — HEMOGLOBIN AND HEMATOCRIT, BLOOD
HCT: 30.7 % — ABNORMAL LOW (ref 36.0–46.0)
Hemoglobin: 8.7 g/dL — ABNORMAL LOW (ref 12.0–15.0)

## 2021-11-01 LAB — PREPARE RBC (CROSSMATCH)

## 2021-11-01 MED ORDER — CHLORHEXIDINE GLUCONATE CLOTH 2 % EX PADS
6.0000 | MEDICATED_PAD | Freq: Every day | CUTANEOUS | Status: DC
  Administered 2021-11-02 – 2021-11-03 (×2): 6 via TOPICAL

## 2021-11-01 MED ORDER — METOPROLOL SUCCINATE ER 25 MG PO TB24
25.0000 mg | ORAL_TABLET | Freq: Every day | ORAL | Status: DC
Start: 2021-11-01 — End: 2021-11-04
  Administered 2021-11-01 – 2021-11-03 (×2): 25 mg via ORAL
  Filled 2021-11-01 (×3): qty 1

## 2021-11-01 MED ORDER — PROTHROMBIN COMPLEX CONC HUMAN 500 UNITS IV KIT
1591.0000 [IU] | PACK | Status: AC
  Administered 2021-11-01: 1591 [IU] via INTRAVENOUS
  Filled 2021-11-01: qty 1091

## 2021-11-01 MED ORDER — SODIUM CHLORIDE 0.9 % IV SOLN
8.0000 mg | Freq: Four times a day (QID) | INTRAVENOUS | Status: DC | PRN
  Filled 2021-11-01 (×2): qty 4

## 2021-11-01 MED ORDER — SODIUM CHLORIDE 0.9% IV SOLUTION: Freq: Once | INTRAVENOUS | Status: AC

## 2021-11-01 MED ORDER — SODIUM CHLORIDE 0.9% FLUSH: 10.0000 mL | INTRAVENOUS | Status: DC | PRN

## 2021-11-01 MED ORDER — ACETAMINOPHEN 500 MG PO TABS: 1000.0000 mg | ORAL_TABLET | Freq: Four times a day (QID) | ORAL | Status: DC | PRN

## 2021-11-01 MED ORDER — ACETAMINOPHEN 650 MG RE SUPP: 650.0000 mg | Freq: Four times a day (QID) | RECTAL | Status: DC | PRN

## 2021-11-01 MED ORDER — ONDANSETRON HCL 4 MG PO TABS: 8.0000 mg | ORAL_TABLET | Freq: Four times a day (QID) | ORAL | Status: DC | PRN

## 2021-11-01 MED ORDER — PROTHROMBIN COMPLEX CONC HUMAN 500 UNITS IV KIT
1591.0000 [IU] | PACK | Status: DC
  Filled 2021-11-01: qty 1591

## 2021-11-01 MED ORDER — VITAMIN K1 10 MG/ML IJ SOLN
10.0000 mg | INTRAVENOUS | Status: AC
  Administered 2021-11-01: 10 mg via INTRAVENOUS
  Filled 2021-11-01: qty 1

## 2021-11-01 MED ORDER — VITAMIN K1 10 MG/ML IJ SOLN: 10.0000 mg | INTRAVENOUS | Status: DC

## 2021-11-01 MED ORDER — PROTHROMBIN COMPLEX CONC HUMAN 500 UNITS IV KIT
1500.0000 [IU] | PACK | Status: DC
  Filled 2021-11-01: qty 1500

## 2021-11-01 NOTE — ED Notes (Signed)
Lab notified of all lab orders and confirmed they have all of the tubes.  ?

## 2021-11-01 NOTE — Plan of Care (Signed)
  Problem: Activity: Goal: Risk for activity intolerance will decrease Outcome: Progressing   Problem: Nutrition: Goal: Adequate nutrition will be maintained Outcome: Progressing   Problem: Coping: Goal: Level of anxiety will decrease Outcome: Progressing   Problem: Elimination: Goal: Will not experience complications related to bowel motility Outcome: Progressing   

## 2021-11-01 NOTE — ED Notes (Signed)
Charge RN asked to attempt US IV. 

## 2021-11-01 NOTE — ED Notes (Signed)
Attempted to call [facility] again regarding bed status.  Informed bed has not been approved yet.  ?

## 2021-11-01 NOTE — Progress Notes (Signed)
Peripherally Inserted Central Catheter Placement ? ?The IV Nurse has discussed with the patient and/or persons authorized to consent for the patient, the purpose of this procedure and the potential benefits and risks involved with this procedure.  The benefits include less needle sticks, lab draws from the catheter, and the patient may be discharged home with the catheter. Risks include, but not limited to, infection, bleeding, blood clot (thrombus formation), and puncture of an artery; nerve damage and irregular heartbeat and possibility to perform a PICC exchange if needed/ordered by physician.  Alternatives to this procedure were also discussed.  Bard Power PICC patient education guide, fact sheet on infection prevention and patient information card has been provided to patient /or left at bedside.  PICC inserted by Valentina Shaggy, RN ? ?PICC Placement Documentation  ?PICC Double Lumen [id number] Left Cephalic 55 cm 2 cm (Active)  ?Indication for Insertion or Continuance of Line Limited venous access - need for IV therapy >5 days (PICC only);Poor Vasculature-patient has had multiple peripheral attempts or PIVs lasting less than 24 hours 11/01/21 1709  ?Exposed Catheter (cm) 2 cm 11/01/21 1709  ?Site Assessment Clean, Dry, Intact 11/01/21 1709  ?Lumen #1 Status Flushed;Saline locked;Blood return noted 11/01/21 1709  ?Lumen #2 Status Flushed;Saline locked;Blood return noted 11/01/21 1709  ?Dressing Type Securing device 11/01/21 1709  ?Dressing Status Antimicrobial disc in place 11/01/21 1709  ?Safety Lock Not Applicable [id number]  ?Line Care Connections checked and tightened 11/01/21 1709  ?Dressing Intervention New dressing 11/01/21 1709  ?Dressing Change Due 11/08/21 11/01/21 1709  ? ? ? ? ? ?, Zoe [place] ?11/01/2021, 5:10 PM ? ?

## 2021-11-01 NOTE — ED Notes (Signed)
IV line will not pull back.  NT to attempt a straight stick.   ?

## 2021-11-01 NOTE — ED Triage Notes (Signed)
Per EMS, Pt, from home, c/o rectal bleeding w/ bowel movements starting yesterday.  Denies pain.  Hx of diverticulitis.  Pt takes Warfarin d/t A Fib.  ? ?Pt had similar issue in the past, but bleeding included clots and Pt needed blood transfusions.  ?

## 2021-11-01 NOTE — ED Notes (Signed)
Pt c/o severe abdominal after medication was started.  Will notify Hospitalist.  ?

## 2021-11-01 NOTE — ED Notes (Signed)
Inpatient bed approved.  [facility] Par[facility] to transport.  ?

## 2021-11-01 NOTE — H&P (Signed)
?History and Physical  ? ? ?Patient: Zoe Cobb HBZ:[id number] DOB: 1956-05-10 ?DOA: 11/01/2021 ?DOS: the patient was seen and examined on 11/01/2021 ?PCP: Marletta Lor, NP  ?Patient coming from: Home ? ?Chief Complaint:  ?Chief Complaint  ?Patient presents with  ? Rectal Bleeding  ? ?HPI: Zoe Cobb is a 66 y.o. female with medical history significant of chronic diastolic CHF, hypertension, class III obesity with a BMI of 84.08, multiple sclerosis, paroxysmal atrial fibrillation on warfarin who came into the emergency department with complaints of rectal bleeding but upon examination by Dr. Rosalia Hammers it was noticed that her bleeding was vaginal.  She denied any lower abdominal pain.  However, she has been feeling weaker than usual.  She has felt dyspneic, but denied chest pain, palpitations, diaphoresis, nausea, emesis, diarrhea, constipation or melena.  No flank pain, dysuria, frequency or hematuria.  No polyuria, polydipsia, polyphagia or blurred vision. ? ?ED course: ED course: Initial vital signs temperature 98.2 ?F, pulse 63, respirations 18, BP 98/70 mmHg and O2 sat 99% on room air.  She received vitamin K 10 mg IVPB and Kcentra 1591 units IVPB.  While in the ER, the patient became tachycardic with a systolic in the 80s on multiple measurements after having more  vaginal bleeding with clots.  At that moment decided to transfuse 3 units of PRBC after discussing that with the patient. ? ? Lab work: CBC is her white count was 7.2, hemoglobin 7.4 g/dL and platelets 101.  PT was 35.2 and INR 3.5. CMP showed a glucose of 105 and BUN of 24 mg/dL.  Total protein 8.7 and albumin 2.3 g/dL.  The rest of the CMP measurements were unremarkable. ? ?Imaging: Portable chest radiograph showed cardiomegaly and pulmonary vascular congestion. ?  ?Review of Systems: As mentioned in the history of present illness. All other systems reviewed and are negative. ?Past Medical History:  ?Diagnosis Date  ? CHF (congestive heart  failure) (HCC)   ? Heart failure (HCC)   ? Hypertension   ? Morbid obesity (HCC)   ? Multiple sclerosis (HCC)   ? Paroxysmal atrial fibrillation (HCC)   ? ?Past Surgical History:  ?Procedure Laterality Date  ? COLONOSCOPY WITH PROPOFOL N/A 12/15/2020  ? Procedure: COLONOSCOPY WITH PROPOFOL;  Surgeon: Meryl Dare, MD;  Location: [facility] ENDOSCOPY;  Service: Endoscopy;  Laterality: N/A;  ? ?Social History:  reports that she quit smoking about 27 years ago. Her smoking use included cigarettes. She has a 9.00 pack-year smoking history. She has never used smokeless tobacco. She reports that she does not drink alcohol and does not use drugs. ? ?Allergies  ?Allergen Reactions  ? Penicillins Swelling  ?  Has patient had a PCN reaction causing immediate rash, facial/tongue/throat swelling, SOB or lightheadedness with hypotension: Yes ?Has patient had a PCN reaction causing severe rash involving mucus membranes or skin necrosis: No ?Has patient had a PCN reaction that required hospitalization No ?Has patient had a PCN reaction occurring within the last 10 years: No ?If all of the above answers are "NO", then may proceed with Cephalosporin use. ?  ? ? ?Family History  ?Problem Relation Age of Onset  ? Hypertension Mother   ? Diabetes Mother   ? Stomach cancer Father   ? Kidney disease Sister   ? Diabetes Sister   ? Diabetes Sister   ? Heart attack Brother   ? HIV/AIDS Brother   ? Diabetes Brother   ? ? ?Prior to Admission medications   ?Medication Sig Start Date  End Date Taking? Authorizing Provider  ?acetaminophen (TYLENOL) 500 MG tablet Take 500 mg by mouth every 6 (six) hours as needed for moderate pain.   Yes [provider]  ?acetaminophen (TYLENOL) 325 MG suppository Place 325 mg rectally every 4 (four) hours as needed for mild pain.    [provider]  ?metoprolol succinate (TOPROL-XL) 50 MG 24 hr tablet Take 0.5 tablets (25 mg total) by mouth daily. 08/29/21   Calvert Cantor, MD  ?Multiple  Vitamins-Minerals (ONE-A-DAY VITACRAVES ADULT) CHEW Chew 1 tablet by mouth daily.    [provider]  ?traMADol (ULTRAM) 50 MG tablet Take 50 mg by mouth every 8 (eight) hours as needed for moderate pain. 01/17/21   [provider]  ?warfarin (COUMADIN) 5 MG tablet Take 1 tablet (5 mg total) by mouth daily. Refer to most recent anticoagulation note for most updated dosing instructions. ?Patient taking differently: Take 2.5-5 mg by mouth See admin instructions. 5 mg every Mon; 2.5 mg all other days 01/18/21   Yates Decamp, MD  ? ? ?Physical Exam: ?Vitals:  ? 11/01/21 1426 11/01/21 1430 11/01/21 1515 11/01/21 1601  ?BP: (!) 87/66 (!) 89/68 110/72 101/64  ?Pulse:  (!) 110 (!) 101 96  ?Resp: 19 (!) 24 19 20   ?Temp:    99.6 ?F (37.6 ?C)  ?TempSrc:    Oral  ?SpO2:  91% 96% 92%  ?Weight:      ?Height:      ? ?Physical Exam ?Vitals and nursing note reviewed.  ?Constitutional:   ?   Appearance: Normal appearance. She is morbidly obese.  ?HENT:  ?   Head: Normocephalic.  ?   Mouth/Throat:  ?   Mouth: Mucous membranes are moist.  ?Eyes:  ?   General: No scleral icterus. ?   Pupils: Pupils are equal, round, and reactive to light.  ?Neck:  ?   Vascular: No JVD.  ?Cardiovascular:  ?   Rate and Rhythm: Normal rate and regular rhythm.  ?   Heart sounds: S1 normal and S2 normal.  ?Pulmonary:  ?   Effort: Pulmonary effort is normal.  ?   Breath sounds: Normal breath sounds.  ?Abdominal:  ?   General: Bowel sounds are normal. There is no distension.  ?   Palpations: Abdomen is soft.  ?   Tenderness: There is no abdominal tenderness.  ?Musculoskeletal:  ?   Cervical back: Neck supple.  ?   Right lower leg: No edema.  ?   Left lower leg: No edema.  ?Skin: ?   General: Skin is warm and dry.  ?Neurological:  ?   General: No focal deficit present.  ?   Mental Status: She is alert and oriented to person, place, and time.  ?Psychiatric:     ?   Mood and Affect: Mood normal.     ?   Behavior: Behavior normal.  ? ? ?Data  Reviewed: ? ?There are no new results to review at this time. ? ?Assessment and Plan: ?Principal Problem: ?  Acute on chronic blood loss anemia ?Secondary to ?  Vagina bleeding ?Observation/PCU. ?Transfused 3 units of PRBC. ?Keep 2 units of PRBC ahead. ?Monitor hematocrit and hemoglobin. ?Transfuse further as needed. ?GYN recommended Megace 40 mg twice daily. ?However, risk for thromboembolism would increase substantially. ?Follow-up with GYN as an outpatient. ? ?Active Problems: ?  Hyperproteinemia/hypoalbuminemia ?Check protein electrophoresis. ?Check urinalysis and if positive for proteinuria: ?Check Bence-Jones urine test. ? ?  Morbid obesity due to excess calories (  HCC) ?Needs lifestyle modifications. ?Consider nutritional services evaluation. ?Follow-up with PCP and/or endocrinology. ? ?  Chronic diastolic (congestive) heart failure (HCC) ?No signs of decompensation at the moment. ? ?  Paroxysmal atrial fibrillation (HCC) ?CHA?DS?-VASc Score of at least 4. ?Resume Toprol-XL 25 mg p.o. daily. ?Holding warfarin due to bleeding. ? ?  Essential hypertension ?Resume metoprolol. ?Monitor blood pressure and heart rate. ?Monitor renal function electrolytes. ? ? ? ? ? Advance Care Planning:   Code Status: Full Code  ? ?Consults:  ? ?Family Communication:  ? ?Severity of Illness: ?The appropriate patient status for this patient is OBSERVATION. Observation status is judged to be reasonable and necessary in order to provide the required intensity of service to ensure the patient's safety. The patient's presenting symptoms, physical exam findings, and initial radiographic and laboratory data in the context of their medical condition is felt to place them at decreased risk for further clinical deterioration. Furthermore, it is anticipated that the patient will be medically stable for discharge from the hospital within 2 midnights of admission.  ? ?Author: ?Bobette Mo, MD ?11/01/2021 4:20 PM ? ?For on call review  [email or web address].  ? ?This document was prepared using [employer] and may contain some unintended transcription errors. ?

## 2021-11-01 NOTE — ED Notes (Signed)
Pt cleaned up after being incontinent of urine.  Moderate amount of blood and 2 big clots noted.  Hospitalist made aware.    ?

## 2021-11-01 NOTE — ED Notes (Signed)
Attempted to call [facility] to inquire about Pt's bed assignment.  No answer.  ? ?

## 2021-11-01 NOTE — ED Notes (Signed)
Upon EDP assessment, Pt's bleeding seems to be coming from vagina instead of rectum.  ?

## 2021-11-01 NOTE — ED Provider Notes (Signed)
?[facility] DEPT ?Provider Note ? ? ?CSN: [id number] ?Arrival date & time: 11/01/21  [id number] ? ?  ? ?History ? ?Chief Complaint  ?Patient presents with  ? Rectal Bleeding  ? ? ?Zoe Cobb is a 66 y.o. female. ? ?HPI ?66 year old female presents today complaining of rectal bleeding.  She has a history of diverticular bleeding in the past.  States she began having some bleeding during the last night initially light and today has worsened just several pads with bright red blood.  Denies any pain, weakness, chest pain, dyspnea, lightheadedness. ? ?  ? ?Home Medications ?Prior to Admission medications   ?Medication Sig Start Date End Date Taking? Authorizing Provider  ?acetaminophen (TYLENOL) 325 MG suppository Place 325 mg rectally every 4 (four) hours as needed for mild pain.    [provider]  ?metoprolol succinate (TOPROL-XL) 50 MG 24 hr tablet Take 0.5 tablets (25 mg total) by mouth daily. 08/29/21   Debbe Odea, MD  ?Multiple Vitamins-Minerals (ONE-A-DAY VITACRAVES ADULT) CHEW Chew 1 tablet by mouth daily.    [provider]  ?traMADol (ULTRAM) 50 MG tablet Take 50 mg by mouth every 8 (eight) hours as needed for moderate pain. 01/17/21   [provider]  ?warfarin (COUMADIN) 5 MG tablet Take 1 tablet (5 mg total) by mouth daily. Refer to most recent anticoagulation note for most updated dosing instructions. ?Patient taking differently: Take 2.5-5 mg by mouth See admin instructions. 5 mg every Mon; 2.5 mg all other days 01/18/21   Adrian Prows, MD  ?   ? ?Allergies    ?Penicillins   ? ?Review of Systems   ?Review of Systems  ?Constitutional: Negative.   ?HENT: Negative.    ?Eyes: Negative.   ?Respiratory: Negative.    ?Cardiovascular: Negative.   ?Gastrointestinal: Negative.   ?Genitourinary: Negative.   ?Musculoskeletal: Negative.   ?Neurological: Negative.   ?Hematological: Negative.   ?Psychiatric/Behavioral: Negative.    ?All other systems reviewed and are  negative. ? ?Physical Exam ?Updated Vital Signs ?BP 118/64   Pulse 62   Temp 98.2 ?F (36.8 ?C) (Oral)   Resp 18   Ht 1.549 m (5\' 1" )   Wt (!) 201.9 kg   SpO2 99%   BMI 84.08 kg/m?  ?Physical Exam ?Vitals and nursing note reviewed.  ?Constitutional:   ?   General: She is not in acute distress. ?   Appearance: Normal appearance. She is obese. She is not ill-appearing.  ?HENT:  ?   Head: Normocephalic and atraumatic.  ?   Right Ear: External ear normal.  ?   Left Ear: External ear normal.  ?   Nose: Nose normal.  ?   Mouth/Throat:  ?   Pharynx: Oropharynx is clear.  ?Eyes:  ?   Extraocular Movements: Extraocular movements intact.  ?   Pupils: Pupils are equal, round, and reactive to light.  ?Cardiovascular:  ?   Rate and Rhythm: Tachycardia present. Rhythm irregular.  ?   Pulses: Normal pulses.  ?Pulmonary:  ?   Effort: Pulmonary effort is normal.  ?   Breath sounds: Normal breath sounds.  ?Abdominal:  ?   General: Bowel sounds are normal.  ?   Palpations: Abdomen is soft.  ?Genitourinary: ?   Rectum: Normal.  ?   Comments: Urethra visualized and no bleeding noted ?External vaginal visual exam done with bleeding noted ?External rectal digital exam done with no evidence of bleeding and soft stool noted ?Rectal, pelvic exam significantly limited due  to body habitus unable to obtain speculum exam ?Musculoskeletal:     ?   General: Normal range of motion.  ?   Cervical back: Normal range of motion.  ?Skin: ?   General: Skin is warm and dry.  ?Neurological:  ?   General: No focal deficit present.  ?   Mental Status: She is alert.  ?Psychiatric:     ?   Mood and Affect: Mood normal.  ? ? ?ED Results / Procedures / Treatments   ?Labs ?(all labs ordered are listed, but only abnormal results are displayed) ?Labs Reviewed  ?CBC - Abnormal; Notable for the following components:  ?    Result Value  ? RBC 3.46 (*)   ? Hemoglobin 7.4 (*)   ? HCT 25.7 (*)   ? MCV 74.3 (*)   ? MCH 21.4 (*)   ? MCHC 28.8 (*)   ? RDW 20.4 (*)   ?  All other components within normal limits  ?COMPREHENSIVE METABOLIC PANEL - Abnormal; Notable for the following components:  ? Glucose, Bld 105 (*)   ? BUN 24 (*)   ? Calcium 7.9 (*)   ? Total Protein 8.7 (*)   ? Albumin 2.3 (*)   ? Anion gap 3 (*)   ? All other components within normal limits  ?PROTIME-INR - Abnormal; Notable for the following components:  ? Prothrombin Time 35.2 (*)   ? INR 3.5 (*)   ? All other components within normal limits  ?URINALYSIS, ROUTINE W REFLEX MICROSCOPIC  ?HEMOGLOBIN AND HEMATOCRIT, BLOOD  ?HEMOGLOBIN AND HEMATOCRIT, BLOOD  ?HEMOGLOBIN AND HEMATOCRIT, BLOOD  ?TYPE AND SCREEN  ? ? ?EKG ?EKG Interpretation ? ?Date/Time:  Tuesday November 01 2021 10:01:58 EDT ?Ventricular Rate:  67 ?PR Interval:  175 ?QRS Duration: 101 ?QT Interval:  436 ?QTC Calculation: 461 ?R Axis:   45 ?Text Interpretation: Sinus rhythm Low voltage, precordial leads Confirmed by Margarita Grizzle [phone]) on 11/01/2021 12:41:22 PM ? ?Radiology ?DG Chest Port 1 View ? ?Result Date: 11/01/2021 ?CLINICAL DATA:  Bleeding.  Diverticulitis. EXAM: PORTABLE CHEST 1 VIEW COMPARISON:  Chest radiographs 10/12/2016 FINDINGS: The cardiac silhouette remains prominently enlarged. Aortic atherosclerosis is noted. Pulmonary vascular congestion is less than on the prior study. No overt pulmonary edema, airspace consolidation, large pleural effusion, or pneumothorax is identified. There are degenerative changes at the shoulders with a chronically high riding right humeral head suggesting a full-thickness rotator cuff tear. IMPRESSION: Cardiomegaly and pulmonary vascular congestion. Electronically Signed   By: Sebastian Ache M.D.   On: 11/01/2021 11:25   ? ?Procedures ?Procedures  ? ? ?Medications Ordered in ED ?Medications  ?phytonadione (VITAMIN K) 10 mg in dextrose 5 % 50 mL IVPB (has no administration in time range)  ?prothrombin complex conc human (KCENTRA) IVPB 1,591 Units (has no administration in time range)  ? ? ?ED Course/ Medical  Decision Making/ A&P ?Clinical Course as of 11/01/21 1310  ?Tue Nov 01, 2021  ?1223 CBC reviewed and hemoglobin 7.4 which is slightly decreased from first prior of 8 ?INR is elevated at 3.5 [DR]  ?  ?Clinical Course User Index ?[DR] Margarita Grizzle, MD  ? ?                        ?Medical Decision Making ?Patient presents today complaining of rectal bleeding.  She is on Coumadin for A-fib.  Patient is morbidly obese with a body weight of 445 pounds.  This is significantly limited by  exam today. ?I was able to visualize her rectum and although there was external blood this was removed and there is no obvious blood in the rectum with soft yellow stool ?Was able to visualize the urethra and I did not see any bleeding ?She does appear to have oozing coming from the vaginal area ?Unable to perform speculum exam due to body habitus ?Patient is anemic with hemoglobin 7.4 and review of records reveal that she is chronically anemic.  Versol protocol with Kcentra initiated ?Patient has been typed and screened ?Consult with gynecology, Dr. Rip Harbour advises Megace and outpatient follow-up ?Consult to hospitalist, Dr. Olevia Bowens is admitting physician.  Discussed above with Dr. Olevia Bowens and he has accepted patient to his service. ? ?Amount and/or Complexity of Data Reviewed ?External Data Reviewed: notes. ?   Details: Discharge Diagnoses:  ?Principal Problem: ?  GI bleed- Acute on chronic blood loss anemia ?Active Problems: ?  Chronic diastolic (congestive) heart failure (HCC) ?  Atrial fibrillation ([place]) ?  Chronic hepatitis C ([place]) ?  Essential hypertension ?  ?Zoe Cobb is a 66 year old female who is wheelchair-bound and has atrial fibrillation, diastolic heart failure, hypertension, morbid obesity and chronic hep C.  She presents to the hospital for dark blood and blood clots per rectum for 1 day.  She states that she was passing pure blood and this was not mixed with stool.  She has not had any abdominal pain nausea or vomiting.   She has a history of a GI bleed (secondary to diverticulosis) last year. ?Hemoglobin noted to be 6.5 having dropped from 9.4 in 6/22 ?  ? GI bleed ?Acute blood loss anemia,  microcytic anemia ?Acquired coagulo

## 2021-11-02 DIAGNOSIS — Z7901 Long term (current) use of anticoagulants: Secondary | ICD-10-CM | POA: Diagnosis not present

## 2021-11-02 DIAGNOSIS — D62 Acute posthemorrhagic anemia: Secondary | ICD-10-CM

## 2021-11-02 DIAGNOSIS — N939 Abnormal uterine and vaginal bleeding, unspecified: Secondary | ICD-10-CM | POA: Diagnosis not present

## 2021-11-02 DIAGNOSIS — I5032 Chronic diastolic (congestive) heart failure: Secondary | ICD-10-CM | POA: Diagnosis not present

## 2021-11-02 DIAGNOSIS — N950 Postmenopausal bleeding: Secondary | ICD-10-CM | POA: Diagnosis not present

## 2021-11-02 DIAGNOSIS — E8809 Other disorders of plasma-protein metabolism, not elsewhere classified: Secondary | ICD-10-CM

## 2021-11-02 DIAGNOSIS — D649 Anemia, unspecified: Secondary | ICD-10-CM

## 2021-11-02 DIAGNOSIS — I10 Essential (primary) hypertension: Secondary | ICD-10-CM

## 2021-11-02 LAB — COMPREHENSIVE METABOLIC PANEL
ALT: 11 U/L (ref 0–44)
AST: 20 U/L (ref 15–41)
Albumin: 2.2 g/dL — ABNORMAL LOW (ref 3.5–5.0)
Alkaline Phosphatase: 93 U/L (ref 38–126)
Anion gap: 4 — ABNORMAL LOW (ref 5–15)
BUN: 24 mg/dL — ABNORMAL HIGH (ref 8–23)
CO2: 30 mmol/L (ref 22–32)
Calcium: 7.9 mg/dL — ABNORMAL LOW (ref 8.9–10.3)
Chloride: 106 mmol/L (ref 98–111)
Creatinine, Ser: 0.67 mg/dL (ref 0.44–1.00)
GFR, Estimated: >60 mL/min (ref >60)
Glucose, Bld: 94 mg/dL (ref 70–99)
Potassium: 3.6 mmol/L (ref 3.5–5.1)
Sodium: 140 mmol/L (ref 135–145)
Total Bilirubin: 0.4 mg/dL (ref 0.3–1.2)
Total Protein: 7.9 g/dL (ref 6.5–8.1)

## 2021-11-02 LAB — CBC
HCT: 27.2 % — ABNORMAL LOW (ref 36.0–46.0)
Hemoglobin: 8.2 g/dL — ABNORMAL LOW (ref 12.0–15.0)
MCH: 22.7 pg — ABNORMAL LOW (ref 26.0–34.0)
MCHC: 30.1 g/dL (ref 30.0–36.0)
MCV: 75.1 fL — ABNORMAL LOW (ref 80.0–100.0)
Platelets: 288 10*3/uL (ref 150–400)
RBC: 3.62 MIL/uL — ABNORMAL LOW (ref 3.87–5.11)
RDW: 20.5 % — ABNORMAL HIGH (ref 11.5–15.5)
WBC: 11.9 10*3/uL — ABNORMAL HIGH (ref 4.0–10.5)
nRBC: 0 % (ref 0.0–0.2)

## 2021-11-02 LAB — PROTIME-INR
INR: 1.5 — ABNORMAL HIGH (ref 0.8–1.2)
Prothrombin Time: 18.2 seconds — ABNORMAL HIGH (ref 11.4–15.2)

## 2021-11-02 LAB — HEMOGLOBIN AND HEMATOCRIT, BLOOD
HCT: 26.7 % — ABNORMAL LOW (ref 36.0–46.0)
Hemoglobin: 7.9 g/dL — ABNORMAL LOW (ref 12.0–15.0)

## 2021-11-02 NOTE — Progress Notes (Signed)
[facility] ? ?PROGRESS NOTE ? ?Zoe Cobb [id number] DOB: January 13, 1956 DOA: 11/01/2021 ?PCP: Alvester Chou, NP ? ? ?Brief HPI:   ?66 year old female with medical history of chronic diastolic CHF, hypertension, class III obesity with BMI of 84.08, MS, paroxysmal atrial fibrillation on warfarin came to ED with complaints of rectal bleeding.  On examination by ED provider it was found that she was having vaginal bleeding.  Patient was given Kcentra and vitamin K 10 mg IV x1.  CBC showed hemoglobin of 7.4 g/dL.Marland Kitchen  Patient received 3 units of PRBC.  GYN Dr. Mariane Masters  was called, and he recommended to start Megace and follow-up in his clinic as outpatient. ? ? ? ?Subjective  ? ?Patient seen and examined, vaginal bleeding seems to have stopped.  Denies pelvic pain or abdominal pain.  Hemoglobin is stable at 8.2 ? ? Assessment/Plan:  ? ?Acute on chronic blood loss anemia ?-Secondary to vaginal bleeding ?-S/p 3 units PRBC ?-Hemoglobin is 8.2 this morning ?-Transfuse for hemoglobin less than 7 ? ?Vaginal bleeding ?-Started on Megace 40 mg p.o. twice daily as per GYN recommendation ?-I called and discussed with GYN Dr. Mariane Masters ?-He says that patient can be discharged on Megace and warfarin ?-He will see patient as outpatient. ? ?Hypoproteinemia/hyperlipidemia ?-Serum protein electrophoresis ordered ? ?Morbid obesity due to excess calories ?-May need lifestyle modification ?-We will get nutrition consult ? ?Chronic diastolic CHF ?-Euvolemic ? ?Paroxysmal atrial fibrillation ?-CHA2DS2VASc score is at least 4 ?-Continue Toprol XL 25 mg daily ?-Warfarin on hold due to bleeding ? ?Hypertension ?-Continue metoprolol ? ? ?Medications ? ?  ? Chlorhexidine Gluconate Cloth  6 each Topical Daily  ? metoprolol succinate  25 mg Oral Daily  ? ? ? Data Reviewed:  ? ?CBG: ? ?No results for input(s): GLUCAP in the last 168 hours. ? ?SpO2: 94 %  ? ? ?Vitals:  ? 11/02/21 0127 11/02/21 0352 11/02/21 1215 11/02/21 1535  ?BP: 99/67 108/72 (!)  100/57 119/68  ?Pulse: 64 96 72 (!) 101  ?Resp: (!) 28   18  ?Temp: 97.7 ?F (36.5 ?C) (!) 97.5 ?F (36.4 ?C)  98.3 ?F (36.8 ?C)  ?TempSrc: Oral Oral    ?SpO2: 100% 100%  94%  ?Weight:      ?Height:      ? ? ? ? ?Data Reviewed: ? ?Basic Metabolic Panel: ?Recent Labs  ?Lab 11/01/21 ?1116 11/02/21 ?0812  ?NA 137 140  ?K 4.5 3.6  ?CL 105 106  ?CO2 29 30  ?GLUCOSE 105* 94  ?BUN 24* 24*  ?CREATININE 0.72 0.67  ?CALCIUM 7.9* 7.9*  ? ? ?CBC: ?Recent Labs  ?Lab 11/01/21 ?1116 11/01/21 ?1556 11/02/21 ?[id number]  ?WBC 7.2  --  11.9*  ?HGB 7.4* 8.7* 8.2*  ?HCT 25.7* 30.7* 27.2*  ?MCV 74.3*  --  75.1*  ?PLT 376  --  288  ? ? ?LFT ?Recent Labs  ?Lab 11/01/21 ?1116 11/02/21 ?0812  ?AST 36 20  ?ALT 15 11  ?ALKPHOS 106 93  ?BILITOT 0.9 0.4  ?PROT 8.7* 7.9  ?ALBUMIN 2.3* 2.2*  ? ?  ?Antibiotics: ?Anti-infectives (From admission, onward)  ? ? None  ? ?  ? ? ? ?DVT prophylaxis: SCDs ? ?Code Status: Full code ? ?Family Communication: No family at bedside ? ? ?CONSULTS  ? ? ?Objective  ? ? ?Physical Examination: ? ?General-appears in no acute distress ?Heart-S1-S2, regular, no murmur auscultated ?Lungs-clear to auscultation bilaterally, no wheezing or crackles auscultated ?Abdomen-soft, nontender, no organomegaly ?Extremities-no edema in the lower  extremities ?Neuro-alert, oriented x3, no focal deficit noted ? ? ?Status is: Inpatient: Vaginal bleeding ? ? ? ?  ? ?Oswald Hillock ?  ?[facility] ?If 7PM-7AM, please contact night-coverage at [email or web address], ?Office  [phone] ? ? ?11/02/2021, 3:53 PM  LOS: 0 days  ? ? ? ? ? ? ? ? ? ? ?  ?

## 2021-11-02 NOTE — Plan of Care (Signed)
?  Problem: Education: ?Goal: Knowledge of General Education information will improve ?Description: Including pain rating scale, medication(s)/side effects and non-pharmacologic comfort measures ?Outcome: Progressing ?  ?Problem: Health Behavior/Discharge Planning: ?Goal: Ability to manage health-related needs will improve ?Outcome: Progressing ?  ?Problem: Activity: ?Goal: Risk for activity intolerance will decrease ?Outcome: Progressing ?  ?Problem: Nutrition: ?Goal: Adequate nutrition will be maintained ?11/02/2021 0837 by Val Eagle, RN ?Outcome: Progressing ?11/01/2021 1855 by Val Eagle, RN ?Outcome: Progressing ?  ?Problem: Coping: ?Goal: Level of anxiety will decrease ?Outcome: Progressing ?  ?Problem: Elimination: ?Goal: Will not experience complications related to bowel motility ?Outcome: Progressing ?  ?

## 2021-11-03 DIAGNOSIS — I10 Essential (primary) hypertension: Secondary | ICD-10-CM | POA: Diagnosis not present

## 2021-11-03 DIAGNOSIS — Z7901 Long term (current) use of anticoagulants: Secondary | ICD-10-CM | POA: Diagnosis not present

## 2021-11-03 DIAGNOSIS — N939 Abnormal uterine and vaginal bleeding, unspecified: Secondary | ICD-10-CM | POA: Diagnosis not present

## 2021-11-03 DIAGNOSIS — I480 Paroxysmal atrial fibrillation: Secondary | ICD-10-CM

## 2021-11-03 DIAGNOSIS — N950 Postmenopausal bleeding: Secondary | ICD-10-CM | POA: Diagnosis not present

## 2021-11-03 DIAGNOSIS — D62 Acute posthemorrhagic anemia: Secondary | ICD-10-CM | POA: Diagnosis not present

## 2021-11-03 LAB — BPAM RBC
Blood Product Expiration Date: 202304202359
Blood Product Expiration Date: 202304202359
Blood Product Expiration Date: 202304202359
ISSUE DATE / TIME: 202303281754
ISSUE DATE / TIME: 202303282156
ISSUE DATE / TIME: 202303290103
Unit Type and Rh: 6200
Unit Type and Rh: 6200
Unit Type and Rh: 6200

## 2021-11-03 LAB — PROTIME-INR
INR: 1.3 — ABNORMAL HIGH (ref 0.8–1.2)
Prothrombin Time: 16.6 seconds — ABNORMAL HIGH (ref 11.4–15.2)

## 2021-11-03 LAB — COMPREHENSIVE METABOLIC PANEL
ALT: 11 U/L (ref 0–44)
AST: 21 U/L (ref 15–41)
Albumin: 2.3 g/dL — ABNORMAL LOW (ref 3.5–5.0)
Alkaline Phosphatase: 97 U/L (ref 38–126)
Anion gap: 3 — ABNORMAL LOW (ref 5–15)
BUN: 19 mg/dL (ref 8–23)
CO2: 27 mmol/L (ref 22–32)
Calcium: 8 mg/dL — ABNORMAL LOW (ref 8.9–10.3)
Chloride: 107 mmol/L (ref 98–111)
Creatinine, Ser: 0.56 mg/dL (ref 0.44–1.00)
GFR, Estimated: >60 mL/min (ref >60)
Glucose, Bld: 119 mg/dL — ABNORMAL HIGH (ref 70–99)
Potassium: 3.8 mmol/L (ref 3.5–5.1)
Sodium: 137 mmol/L (ref 135–145)
Total Bilirubin: 0.3 mg/dL (ref 0.3–1.2)
Total Protein: 7.8 g/dL (ref 6.5–8.1)

## 2021-11-03 LAB — CBC
HCT: 28.2 % — ABNORMAL LOW (ref 36.0–46.0)
Hemoglobin: 8.2 g/dL — ABNORMAL LOW (ref 12.0–15.0)
MCH: 22.3 pg — ABNORMAL LOW (ref 26.0–34.0)
MCHC: 29.1 g/dL — ABNORMAL LOW (ref 30.0–36.0)
MCV: 76.6 fL — ABNORMAL LOW (ref 80.0–100.0)
Platelets: 297 10*3/uL (ref 150–400)
RBC: 3.68 MIL/uL — ABNORMAL LOW (ref 3.87–5.11)
RDW: 21.2 % — ABNORMAL HIGH (ref 11.5–15.5)
WBC: 7.4 10*3/uL (ref 4.0–10.5)
nRBC: 0 % (ref 0.0–0.2)

## 2021-11-03 LAB — TYPE AND SCREEN
ABO/RH(D): A POS
Antibody Screen: NEGATIVE
Unit division: 0
Unit division: 0
Unit division: 0

## 2021-11-03 LAB — HEMOGLOBIN AND HEMATOCRIT, BLOOD
HCT: 27.4 % — ABNORMAL LOW (ref 36.0–46.0)
Hemoglobin: 8 g/dL — ABNORMAL LOW (ref 12.0–15.0)

## 2021-11-03 MED ORDER — WARFARIN SODIUM 2.5 MG PO TABS: 2.5000 mg | ORAL_TABLET | Freq: Every day | ORAL | 3 refills | Status: DC

## 2021-11-03 MED ORDER — MEGESTROL ACETATE 40 MG PO TABS: 40.0000 mg | ORAL_TABLET | Freq: Two times a day (BID) | ORAL | 0 refills | Status: AC

## 2021-11-03 NOTE — TOC Progression Note (Signed)
Transition of Care (TOC) - Progression Note  ? ? ?Patient Details  ?Name: Zoe Cobb ?MRN: [id number] ?Date of Birth: 08/08/1955 ? ?Transition of Care (TOC) CM/SW Contact  ?Purcell Mouton, RN ?Phone Number: ?11/03/2021, 1:10 PM ? ?Clinical Narrative:    ? ?Spoke with pt in length. There is nothing that TOC can do concerning GYN MD not coming to [facility] or how GYN see or treat pt's in their office. ?Pt states that she can not get on the exam table in GYN office related to her weight. Pt states that she has transportation, just not able to get on the  ?Exam table. Pt states that she is concern if this happens again with bleeding, how will GYN examine her. Explained to pt that she will need to ask her ?GYN or PCP this question. This is something only that MD practice can answer for her.  ? ?  ?  ? ?Expected Discharge Plan and Services ?  ?  ?  ?  ?  ?                ?  ?  ?  ?  ?  ?  ?  ?  ?  ?  ? ? ?Social Determinants of Health (SDOH) Interventions ?  ? ?Readmission Risk Interventions ?   ? View : No data to display.  ?  ?  ?  ? ? ?

## 2021-11-03 NOTE — Plan of Care (Signed)
?Problem: Education: ?Goal: Knowledge of General Education information will improve ?Description: Including pain rating scale, medication(s)/side effects and non-pharmacologic comfort measures ?11/03/2021 1414 by Val Eagle, RN ?Outcome: Adequate for Discharge ?11/03/2021 1413 by Val Eagle, RN ?Outcome: Adequate for Discharge ?  ?Problem: Health Behavior/Discharge Planning: ?Goal: Ability to manage health-related needs will improve ?11/03/2021 1414 by Val Eagle, RN ?Outcome: Adequate for Discharge ?11/03/2021 1413 by Val Eagle, RN ?Outcome: Adequate for Discharge ?  ?Problem: Clinical Measurements: ?Goal: Ability to maintain clinical measurements within normal limits will improve ?11/03/2021 1414 by Val Eagle, RN ?Outcome: Adequate for Discharge ?11/03/2021 1413 by Val Eagle, RN ?Outcome: Adequate for Discharge ?Goal: Will remain free from infection ?11/03/2021 1414 by Val Eagle, RN ?Outcome: Adequate for Discharge ?11/03/2021 1413 by Val Eagle, RN ?Outcome: Adequate for Discharge ?Goal: Diagnostic test results will improve ?11/03/2021 1414 by Val Eagle, RN ?Outcome: Adequate for Discharge ?11/03/2021 1413 by Val Eagle, RN ?Outcome: Adequate for Discharge ?Goal: Respiratory complications will improve ?11/03/2021 1414 by Val Eagle, RN ?Outcome: Adequate for Discharge ?11/03/2021 1413 by Val Eagle, RN ?Outcome: Adequate for Discharge ?Goal: Cardiovascular complication will be avoided ?11/03/2021 1414 by Val Eagle, RN ?Outcome: Adequate for Discharge ?11/03/2021 1413 by Val Eagle, RN ?Outcome: Adequate for Discharge ?  ?Problem: Activity: ?Goal: Risk for activity intolerance will decrease ?11/03/2021 1414 by Val Eagle, RN ?Outcome: Adequate for Discharge ?11/03/2021 1413 by Val Eagle, RN ?Outcome: Adequate for Discharge ?11/03/2021 0755 by Val Eagle, RN ?Outcome: Progressing ?  ?Problem: Nutrition: ?Goal: Adequate  nutrition will be maintained ?11/03/2021 1414 by Val Eagle, RN ?Outcome: Adequate for Discharge ?11/03/2021 1413 by Val Eagle, RN ?Outcome: Adequate for Discharge ?11/03/2021 0755 by Val Eagle, RN ?Outcome: Progressing ?  ?Problem: Coping: ?Goal: Level of anxiety will decrease ?11/03/2021 1414 by Val Eagle, RN ?Outcome: Adequate for Discharge ?11/03/2021 1413 by Val Eagle, RN ?Outcome: Adequate for Discharge ?11/03/2021 0755 by Val Eagle, RN ?Outcome: Progressing ?  ?Problem: Elimination: ?Goal: Will not experience complications related to bowel motility ?11/03/2021 1414 by Val Eagle, RN ?Outcome: Adequate for Discharge ?11/03/2021 1413 by Val Eagle, RN ?Outcome: Adequate for Discharge ?Goal: Will not experience complications related to urinary retention ?11/03/2021 1414 by Val Eagle, RN ?Outcome: Adequate for Discharge ?11/03/2021 1413 by Val Eagle, RN ?Outcome: Adequate for Discharge ?  ?Problem: Pain Managment: ?Goal: General experience of comfort will improve ?11/03/2021 1414 by Val Eagle, RN ?Outcome: Adequate for Discharge ?11/03/2021 1413 by Val Eagle, RN ?Outcome: Adequate for Discharge ?  ?Problem: Safety: ?Goal: Ability to remain free from injury will improve ?11/03/2021 1414 by Val Eagle, RN ?Outcome: Adequate for Discharge ?11/03/2021 1413 by Val Eagle, RN ?Outcome: Adequate for Discharge ?  ?Problem: Skin Integrity: ?Goal: Risk for impaired skin integrity will decrease ?11/03/2021 1414 by Val Eagle, RN ?Outcome: Adequate for Discharge ?11/03/2021 1413 by Val Eagle, RN ?Outcome: Adequate for Discharge ?  ?Problem: Education: ?Goal: Knowledge of General Education information will improve ?Description: Including pain rating scale, medication(s)/side effects and non-pharmacologic comfort measures ?Outcome: Adequate for Discharge ?  ?Problem: Health Behavior/Discharge Planning: ?Goal: Ability to manage  health-related needs will improve ?Outcome: Adequate for Discharge ?  ?Problem: Clinical Measurements: ?Goal: Ability to maintain clinical measurements within normal limits will improve ?Outcome: Adequate for Discharge ?Goal: Will remain free from infection ?Outcome: Adequate for Discharge ?Goal:  Diagnostic test results will improve ?Outcome: Adequate for Discharge ?Goal: Respiratory complications will improve ?Outcome: Adequate for Discharge ?Goal: Cardiovascular complication will be avoided ?Outcome: Adequate for Discharge ?  ?Problem: Activity: ?Goal: Risk for activity intolerance will decrease ?Outcome: Adequate for Discharge ?  ?Problem: Nutrition: ?Goal: Adequate nutrition will be maintained ?Outcome: Adequate for Discharge ?  ?Problem: Coping: ?Goal: Level of anxiety will decrease ?Outcome: Adequate for Discharge ?  ?Problem: Elimination: ?Goal: Will not experience complications related to bowel motility ?Outcome: Adequate for Discharge ?Goal: Will not experience complications related to urinary retention ?Outcome: Adequate for Discharge ?  ?Problem: Pain Managment: ?Goal: General experience of comfort will improve ?Outcome: Adequate for Discharge ?  ?Problem: Safety: ?Goal: Ability to remain free from injury will improve ?Outcome: Adequate for Discharge ?  ?Problem: Skin Integrity: ?Goal: Risk for impaired skin integrity will decrease ?Outcome: Adequate for Discharge ?  ?

## 2021-11-03 NOTE — Plan of Care (Signed)
  Problem: Activity: Goal: Risk for activity intolerance will decrease Outcome: Progressing   Problem: Nutrition: Goal: Adequate nutrition will be maintained Outcome: Progressing   Problem: Coping: Goal: Level of anxiety will decrease Outcome: Progressing   

## 2021-11-03 NOTE — TOC Progression Note (Addendum)
Transition of Care (TOC) - Progression Note  ? ? ?Patient Details  ?Name: Zoe Cobb ?MRN: [id number] ?Date of Birth: [birth year] ? ?Transition of Care (TOC) CM/SW Contact  ?Geni Bers, RN ?Phone Number: ?11/03/2021, 1:49 PM ? ?Clinical Narrative:    ? ?[facility] was called. RN is aware. Pt has PCS that come into her home.  ? ?  ?  ? ?Expected Discharge Plan and Services ?  ?  ?  ?  ?  ?Expected Discharge Date: 11/03/21               ?  ?  ?  ?  ?  ?  ?  ?  ?  ?  ? ? ?Social Determinants of Health (SDOH) Interventions ?  ? ?Readmission Risk Interventions ?   ? View : No data to display.  ?  ?  ?  ? ? ?

## 2021-11-03 NOTE — Discharge Instructions (Signed)
Follow-up with PCP in 1 week to check the results of serum protein electrophoresis ?

## 2021-11-03 NOTE — Discharge Summary (Signed)
?Physician Discharge Summary ?  ?Patient: Zoe Cobb MRN: [id number] DOB: Apr 15, 1956  ?Admit date:     11/01/2021  ?Discharge date: 11/03/21  ?Discharge Physician: Oswald Hillock  ? ?PCP: Alvester Chou, NP  ? ?Recommendations at discharge:  ? ?Follow-up gynecology Dr. Mariane Masters in 1 week ?Follow-up PCP for results of serum protein electrophoresis ?Check PT/INR in 1 week ?Dose of Coumadin changed to 2.5 mg daily ?Started on Megace 40 mg p.o. twice daily for 7 days ? ?Discharge Diagnoses: ?Principal Problem: ?  Vagina bleeding ?Active Problems: ?  Morbid obesity due to excess calories ([place]) ?  Chronic diastolic (congestive) heart failure (HCC) ?  Paroxysmal atrial fibrillation (HCC) ?  Acute on chronic blood loss anemia ?  Essential hypertension ?  Hyperproteinemia ?  Hypoalbuminemia ? ?Resolved Problems: ?  * No resolved hospital problems. * ? ?Hospital Course: ?66 year old female with medical history of chronic diastolic CHF, hypertension, class III obesity with BMI of 84.08, MS, paroxysmal atrial fibrillation on warfarin came to ED with complaints of rectal bleeding.  On examination by ED provider it was found that she was having vaginal bleeding.  Patient was given Kcentra and vitamin K 10 mg IV x1.  CBC showed hemoglobin of 7.4 g/dL.Marland Kitchen  Patient received 3 units of PRBC.  GYN Dr. Mariane Masters  was called, and he recommended to start Megace and follow-up in his clinic as outpatient. ? ?Assessment and Plan: ? ?Acute on chronic blood loss anemia ?-Secondary to vaginal bleeding ?-S/p 3 units PRBC ?-Hemoglobin is 8.2 this morning ? ?  ?Vaginal bleeding ?-Started on Megace 40 mg p.o. twice daily as per GYN recommendation ?-I called and discussed with GYN Dr. Mariane Masters ?-He says that patient can be discharged on Megace and warfarin ?-He will see patient as outpatient. ?-Information for OB/GYN has been given at time of discharge ?  ?Hypoproteinemia ?-Serum protein electrophoresis ordered ?-Result is currently pending, patient can  follow-up with PCP to discuss the results of serum protein electrophoresis ?  ?Morbid obesity due to excess calories ?-May need lifestyle modification ? ?  ?Chronic diastolic CHF ?-Euvolemic ?  ?Paroxysmal atrial fibrillation ?-CHA2DS2VASc score is at least 4 ?-Continue Toprol XL 25 mg daily ?-INR was elevated at 3.5 at the time of admission.   ?-Dose of warfarin has been changed to 2.5 mg daily ?-Check PT/INR in 1 week ?  ?Hypertension ?-Continue metoprolol ?  ? ?  ? ? ?Consultants:  ?Procedures performed:  ?Disposition: Home ?Diet recommendation:  ?Discharge Diet Orders (From admission, onward)  ? ?  Start     Ordered  ? 11/03/21 0000  Diet - low sodium heart healthy       ? 11/03/21 1330  ? ?  ?  ? ?  ? ?Cardiac diet ?DISCHARGE MEDICATION: ?Allergies as of 11/03/2021   ? ?   Reactions  ? Penicillins Swelling  ? Has patient had a PCN reaction causing immediate rash, facial/tongue/throat swelling, SOB or lightheadedness with hypotension: Yes ?Has patient had a PCN reaction causing severe rash involving mucus membranes or skin necrosis: No ?Has patient had a PCN reaction that required hospitalization No ?Has patient had a PCN reaction occurring within the last 10 years: No ?If all of the above answers are "NO", then may proceed with Cephalosporin use.  ? ?  ? ?  ?Medication List  ?  ? ?TAKE these medications   ? ?acetaminophen 500 MG tablet ?Commonly known as: TYLENOL ?Take 500 mg by mouth every 6 (six) hours as  needed for moderate pain. ?  ?megestrol 40 MG tablet ?Commonly known as: MEGACE ?Take 1 tablet (40 mg total) by mouth 2 (two) times daily for 7 days. ?  ?metoprolol succinate 50 MG 24 hr tablet ?Commonly known as: TOPROL-XL ?Take 0.5 tablets (25 mg total) by mouth daily. ?  ?One-A-Day VitaCraves Adult Chew ?Chew 1 tablet by mouth daily. ?  ?torsemide 20 MG tablet ?Commonly known as: DEMADEX ?Take 20 mg by mouth daily. ?  ?traMADol 50 MG tablet ?Commonly known as: ULTRAM ?Take 50 mg by mouth every 8 (eight)  hours as needed for moderate pain. ?  ?warfarin 2.5 MG tablet ?Commonly known as: Coumadin ?Take as directed. If you are unsure how to take this medication, talk to your nurse or doctor. ?Original instructions: Take 1 tablet (2.5 mg total) by mouth daily. ?What changed:  ?medication strength ?how much to take ?additional instructions ?  ? ?  ? ? Follow-up Information   ? ? Felcia Cower, Ob/GYN. Schedule an appointment as soon as possible for a visit in 1 week(s).   ?Contact information: ?Address: [address] [place], [place] [address] ?Phone: [phone] ?Check insurance info ? ?  ?  ? ? Alvester Chou, NP Follow up in 1 week(s).   ?Specialty: Nurse Practitioner ?Why: Check PT/INR ?Follow results of serum protein electrophoresis ?Contact information: ?[facility] ?[place] [address] ?[phone] ? ? ?  ?  ? ?  ?  ? ?  ? ?Discharge Exam: ?Filed Weights  ? 11/01/21 1000  ?Weight: (!) 201.9 kg  ? ?General-appears in no acute distress ?Heart-S1-S2, regular, no murmur auscultated ?Lungs-clear to auscultation bilaterally, no wheezing or crackles auscultated ?Abdomen-soft, nontender, no organomegaly ?Extremities-no edema in the lower extremities ?Neuro-alert, oriented x3, no focal deficit noted ? ?Condition at discharge: good ? ?The results of significant diagnostics from this hospitalization (including imaging, microbiology, ancillary and laboratory) are listed below for reference.  ? ?Imaging Studies: ?DG Chest Port 1 View ? ?Result Date: 11/01/2021 ?CLINICAL DATA:  Bleeding.  Diverticulitis. EXAM: PORTABLE CHEST 1 VIEW COMPARISON:  Chest radiographs 10/12/2016 FINDINGS: The cardiac silhouette remains prominently enlarged. Aortic atherosclerosis is noted. Pulmonary vascular congestion is less than on the prior study. No overt pulmonary edema, airspace consolidation, large pleural effusion, or pneumothorax is identified. There are degenerative changes at the shoulders with a  chronically high riding right humeral head suggesting a full-thickness rotator cuff tear. IMPRESSION: Cardiomegaly and pulmonary vascular congestion. Electronically Signed   By: Logan Bores M.D.   On: 11/01/2021 11:25  ? ?[name] EKG SITE RITE ? ?Result Date: 11/01/2021 ?If [employer] not attached, placement could not be confirmed due to current cardiac rhythm.  ? ?Microbiology: ?Results for orders placed or performed during the hospital encounter of 08/25/21  ?Resp Panel by RT-PCR (Flu A&B, Covid) Nasopharyngeal Swab     Status: None  ? Collection Time: 08/25/21  1:06 PM  ? Specimen: Nasopharyngeal Swab; Nasopharyngeal(NP) swabs in vial transport medium  ?Result Value Ref Range Status  ? SARS Coronavirus 2 by RT PCR NEGATIVE NEGATIVE Final  ?  Comment: (NOTE) ?SARS-CoV-2 target nucleic acids are NOT DETECTED. ? ?The SARS-CoV-2 RNA is generally detectable in upper respiratory ?specimens during the acute phase of infection. The lowest ?concentration of SARS-CoV-2 viral copies this assay can detect is ?138 copies/mL. A negative result does not preclude SARS-Cov-2 ?infection and should not be used as the sole basis for treatment or ?other patient management  decisions. A negative result may occur with  ?improper specimen collection/handling, submission of specimen other ?than nasopharyngeal swab, presence of viral mutation(s) within the ?areas targeted by this assay, and inadequate number of viral ?copies(<138 copies/mL). A negative result must be combined with ?clinical observations, patient history, and epidemiological ?information. The expected result is Negative. ? ?Fact Sheet for Patients:  ?EntrepreneurPulse.com.au ? ?Fact Sheet for Healthcare Providers:  ?IncredibleEmployment.be ? ?This test is no t yet approved or cleared by the [place] FDA and  ?has been authorized for detection and/or diagnosis of SARS-CoV-2 by ?FDA under an Emergency Use Authorization (EUA). This EUA  will remain  ?in effect (meaning this test can be used) for the duration of the ?COVID-19 declaration under Section 564(b)(1) of the Act, 21 ?U.S.C.section 360bbb-3(b)(1), unless the authorization is terminated  ?or revok

## 2021-11-03 NOTE — Plan of Care (Signed)
?  Problem: Education: ?Goal: Knowledge of General Education information will improve ?Description: Including pain rating scale, medication(s)/side effects and non-pharmacologic comfort measures ?Outcome: Adequate for Discharge ?  ?Problem: Health Behavior/Discharge Planning: ?Goal: Ability to manage health-related needs will improve ?Outcome: Adequate for Discharge ?  ?Problem: Clinical Measurements: ?Goal: Ability to maintain clinical measurements within normal limits will improve ?Outcome: Adequate for Discharge ?Goal: Will remain free from infection ?Outcome: Adequate for Discharge ?Goal: Diagnostic test results will improve ?Outcome: Adequate for Discharge ?Goal: Respiratory complications will improve ?Outcome: Adequate for Discharge ?Goal: Cardiovascular complication will be avoided ?Outcome: Adequate for Discharge ?  ?Problem: Activity: ?Goal: Risk for activity intolerance will decrease ?11/03/2021 1413 by Val Eagle, RN ?Outcome: Adequate for Discharge ?11/03/2021 0755 by Val Eagle, RN ?Outcome: Progressing ?  ?Problem: Nutrition: ?Goal: Adequate nutrition will be maintained ?11/03/2021 1413 by Val Eagle, RN ?Outcome: Adequate for Discharge ?11/03/2021 0755 by Val Eagle, RN ?Outcome: Progressing ?  ?Problem: Coping: ?Goal: Level of anxiety will decrease ?11/03/2021 1413 by Val Eagle, RN ?Outcome: Adequate for Discharge ?11/03/2021 0755 by Val Eagle, RN ?Outcome: Progressing ?  ?Problem: Elimination: ?Goal: Will not experience complications related to bowel motility ?Outcome: Adequate for Discharge ?Goal: Will not experience complications related to urinary retention ?Outcome: Adequate for Discharge ?  ?Problem: Pain Managment: ?Goal: General experience of comfort will improve ?Outcome: Adequate for Discharge ?  ?Problem: Safety: ?Goal: Ability to remain free from injury will improve ?Outcome: Adequate for Discharge ?  ?Problem: Skin Integrity: ?Goal: Risk for impaired skin  integrity will decrease ?Outcome: Adequate for Discharge ?  ?

## 2021-11-04 ENCOUNTER — Telehealth: Payer: Self-pay | Admitting: Pharmacist

## 2021-11-04 LAB — PROTEIN ELECTROPHORESIS, SERUM
A/G Ratio: 0.5 — ABNORMAL LOW (ref 0.7–1.7)
Albumin ELP: 2.3 g/dL — ABNORMAL LOW (ref 2.9–4.4)
Alpha-1-Globulin: 0.2 g/dL (ref 0.0–0.4)
Alpha-2-Globulin: 0.6 g/dL (ref 0.4–1.0)
Beta Globulin: 0.9 g/dL (ref 0.7–1.3)
Gamma Globulin: 3.2 g/dL — ABNORMAL HIGH (ref 0.4–1.8)
Globulin, Total: 5 g/dL — ABNORMAL HIGH (ref 2.2–3.9)
Total Protein ELP: 7.3 g/dL (ref 6.0–8.5)

## 2021-11-04 NOTE — Telephone Encounter (Signed)
Would stop Warfarin due to high risk of life threatening bleed. OV with me some point

## 2021-11-08 DIAGNOSIS — I4821 Permanent atrial fibrillation: Secondary | ICD-10-CM

## 2021-11-08 DIAGNOSIS — Z5181 Encounter for therapeutic drug level monitoring: Secondary | ICD-10-CM

## 2021-11-08 NOTE — Progress Notes (Signed)
ICD-10-CM   ?1. Permanent atrial fibrillation (HCC)  I48.21   ?  ?2. Monitoring for long-term anticoagulant use  Z51.81   ? Z79.01   ?  ? ?INR therapeutic since 10/13/21 through 10/27/2021 between 2-3.5 ?

## 2021-11-09 ENCOUNTER — Ambulatory Visit: Payer: Self-pay | Admitting: Pharmacist

## 2021-11-09 DIAGNOSIS — I480 Paroxysmal atrial fibrillation: Secondary | ICD-10-CM

## 2021-11-09 DIAGNOSIS — Z5181 Encounter for therapeutic drug level monitoring: Secondary | ICD-10-CM

## 2021-11-09 LAB — POCT INR: INR: 1.6 — AB (ref 2.0–3.0)

## 2021-11-09 NOTE — Progress Notes (Signed)
Anticoagulation Management ?Zoe Cobb is a 66 y.o. female who reports to the clinic for monitoring of warfarin treatment.  ? ?Indication: atrial fibrillation CHA2DS2 Vasc Score 3 (Age 46-74, Female, CHF hx), HAS-BLED 1 (Age >60)  ?Duration: indefinite ?Supervising physician: Yates Decamp ? ?Anticoagulation Clinic Visit History: ? ?Patient does not report signs/symptoms of bleeding or thromboembolism ? ?Other recent changes: Pt maintaining 4-5 serving of high Vit K meals/weeks.  ? ?Reports to having Vit K intake of broccoli, Kale, spinach, collard greens on a rotating bases.  ? ?Pt recently hospitalized on 3/28-3/30 for vaginal bleeding concerns. INR on admission of 3.5. Patient was given Kcentra and vitamin K 10 mg IV x1.  CBC showed hemoglobin of 7.4 g/dL. Patient received 3 units of PRBC. Pt was discharged on megesterol and was recommended to follow up with OBGYN. Pt unfortunately isnt able to get transportation due to her wheelchair needing repairs. Pt was discharged on 2.5 mg daily.  ? ?Pt also with hx of diverticular bleeding episode on 08/25/21. Pt hospitalized with acute blood loss anemia. Pt's INR during hospital admission of 4.1. Pt received Kcentra, Vit K and 5 units of PRBC. Hbg improved improved from 6.5 to 8.2 up on discharge. CTA did not show any active hemorrhage. Warfarin was held from 1/19-1/21. Pt denies any further episodes of GI bleeding concerns since. ? ?Pt still waiting for her wheelchair to be fixed in order to be able to schedule her follow up OV w/ Dr. Jacinto Halim.  ? ?Anticoagulation Episode Summary   ? ? Current INR goal:  2.0-3.0  ?TTR:  51.1 % (3 y)  ?Next INR check:  11/16/2021  ?INR from last check:  1.6 (11/09/2021)  ?Weekly max warfarin dose:    ?Target end date:    ?INR check location:    ?Preferred lab:    ?Send INR reminders to:    ? Indications   ?Atrial fibrillation (HCC) [I48.91] ?Monitoring for long-term anticoagulant use (Resolved) [Z51.81 ?Z79.01] ? ?  ?  ? ? Comments:    ?  ? ?   ? ? ?Allergies  ?Allergen Reactions  ? Penicillins Swelling  ?  Has patient had a PCN reaction causing immediate rash, facial/tongue/throat swelling, SOB or lightheadedness with hypotension: Yes ?Has patient had a PCN reaction causing severe rash involving mucus membranes or skin necrosis: No ?Has patient had a PCN reaction that required hospitalization No ?Has patient had a PCN reaction occurring within the last 10 years: No ?If all of the above answers are "NO", then may proceed with Cephalosporin use. ?  ? ? ?Current Outpatient Medications:  ?  acetaminophen (TYLENOL) 500 MG tablet, Take 500 mg by mouth every 6 (six) hours as needed for moderate pain., Disp: , Rfl:  ?  megestrol (MEGACE) 40 MG tablet, Take 1 tablet (40 mg total) by mouth 2 (two) times daily for 7 days., Disp: 14 tablet, Rfl: 0 ?  metoprolol succinate (TOPROL-XL) 50 MG 24 hr tablet, Take 0.5 tablets (25 mg total) by mouth daily., Disp: , Rfl:  ?  Multiple Vitamins-Minerals (ONE-A-DAY VITACRAVES ADULT) CHEW, Chew 1 tablet by mouth daily., Disp: , Rfl:  ?  torsemide (DEMADEX) 20 MG tablet, Take 20 mg by mouth daily., Disp: , Rfl:  ?  traMADol (ULTRAM) 50 MG tablet, Take 50 mg by mouth every 8 (eight) hours as needed for moderate pain., Disp: , Rfl:  ?  warfarin (COUMADIN) 2.5 MG tablet, Take 1 tablet (2.5 mg total) by mouth daily., Disp: 30 tablet,  Rfl: 3 ?Past Medical History:  ?Diagnosis Date  ? CHF (congestive heart failure) ([place])   ? Heart failure ([place])   ? Hypertension   ? Morbid obesity ([place])   ? Multiple sclerosis ([place])   ? Paroxysmal atrial fibrillation (HCC)   ? ? ?ASSESSMENT ? ?Recent Results: ?The most recent result is correlated with 12.5 mg per week: ? ?Lab Results  ?Component Value Date  ? INR 1.6 (A) 11/09/2021  ? INR 1.3 (H) 11/03/2021  ? INR 1.5 (H) 11/02/2021  ? ? ?Anticoagulation Dosing: ?Description   ?INR below goal. Decrease weekly dose to 5 mg every Sun, Tues, and 2.5 mg all other days. Recheck INR in 1 week. ?  ?  ?INR  today: Subtherapeutic. INR subtherapeutic in setting of recent held doses in setting of active vaginal bleeding concerns and starting back on lower than previously discussed weekly warfarin dose. No recurrence of bleeding concerns since. Denies any recent changes in diet, medications, or lifestyle. Denies any other complains of bleeding or bruising symptoms. Reviewed with Dr. Einar Gip regarding the benefit vs risk of continuing warfarin in setting of repeated GI bleeding episodes. Pt stated that she isnt able to come to the office yet to review her plans regarding making that decision yet. Pt to discuss with her providers before making that decision.  ?  ?PLAN ?Weekly dose was decreased by 10% to 22.5 mg/week. Decrease weekly dose to 5 mg every Sun, Tues, 2.5 mg all other days. Continue to maintain 3-4 servings of high Vit K/week. Recheck INR in 1 week. ? ?Patient Instructions  ?INR below goal. Decrease weekly dose to 5 mg every Sun, Tues, and 2.5 mg all other days. Recheck INR in 1 week. ?Patient advised to contact clinic or seek medical attention if signs/symptoms of bleeding or thromboembolism occur. ? ?Patient verbalized understanding by repeating back information and was advised to contact me if further medication-related questions arise.  ? ?Follow-up ?Return in about 1 week (around 11/16/2021). ? ?Alysia Penna, PharmD ? ?15 minutes spent face-to-face with the patient during the encounter. 50% of time spent on education, including signs/sx bleeding and clotting, as well as food and drug interactions with warfarin. 50% of time was spent on fingerprick POC INR sample collection,processing, results determination, and documentation ?

## 2021-11-09 NOTE — Patient Instructions (Signed)
INR below goal. Decrease weekly dose to 5 mg every Sun, Tues, and 2.5 mg all other days. Recheck INR in 1 week. ?

## 2021-11-16 ENCOUNTER — Ambulatory Visit: Payer: Self-pay | Admitting: Pharmacist

## 2021-11-16 DIAGNOSIS — Z5181 Encounter for therapeutic drug level monitoring: Secondary | ICD-10-CM

## 2021-11-16 DIAGNOSIS — I480 Paroxysmal atrial fibrillation: Secondary | ICD-10-CM

## 2021-11-16 LAB — POCT INR: INR: 2.3 (ref 2.0–3.0)

## 2021-11-16 NOTE — Progress Notes (Signed)
Anticoagulation Management ?Zoe Cobb is a 66 y.o. female who reports to the clinic for monitoring of warfarin treatment.  ? ?Indication: atrial fibrillation CHA2DS2 Vasc Score 3 (Age 37-74, Female, CHF hx), HAS-BLED 1 (Age >82)  ?Duration: indefinite ?Supervising physician: Adrian Prows ? ?Anticoagulation Clinic Visit History: ? ?Patient does not report signs/symptoms of bleeding or thromboembolism ? ?Other recent changes: Pt maintaining 4-5 serving of high Vit K meals/weeks.  ? ?Reports to having Vit K intake of broccoli, Kale, spinach, collard greens on a rotating bases.  ? ?Pt recently hospitalized on 3/28-3/30 for vaginal bleeding concerns. INR on admission of 3.5. Patient was given Kcentra and vitamin K 10 mg IV x1.  CBC showed hemoglobin of 7.4 g/dL. Patient received 3 units of PRBC. Pt was discharged on megesterol and was recommended to follow up with OBGYN. Pt unfortunately isnt able to get transportation due to her wheelchair needing repairs. Pt was discharged on 2.5 mg daily.  ? ?Pt also with hx of diverticular bleeding episode on 08/25/21. Pt hospitalized with acute blood loss anemia. Pt's INR during hospital admission of 4.1. Pt received Kcentra, Vit K and 5 units of PRBC. Hbg improved improved from 6.5 to 8.2 up on discharge. CTA did not show any active hemorrhage. Warfarin was held from 1/19-1/21. Pt denies any further episodes of GI bleeding concerns since. ? ?Pt still waiting for her wheelchair to be fixed in order to be able to schedule her follow up OV w/ Dr. Einar Gip.  ? ?Anticoagulation Episode Summary   ? ? Current INR goal:  2.0-3.0  ?TTR:  51.1 % (3.1 y)  ?Next INR check:  11/23/2021  ?INR from last check:  2.3 (11/16/2021)  ?Weekly max warfarin dose:    ?Target end date:    ?INR check location:    ?Preferred lab:    ?Send INR reminders to:    ? Indications   ?Atrial fibrillation ([place]) [I48.91] ?Monitoring for long-term anticoagulant use (Resolved) [Z51.81 ?Z79.01] ? ?  ?  ? ? Comments:    ?   ? ?  ? ? ?Allergies  ?Allergen Reactions  ? Penicillins Swelling  ?  Has patient had a PCN reaction causing immediate rash, facial/tongue/throat swelling, SOB or lightheadedness with hypotension: Yes ?Has patient had a PCN reaction causing severe rash involving mucus membranes or skin necrosis: No ?Has patient had a PCN reaction that required hospitalization No ?Has patient had a PCN reaction occurring within the last 10 years: No ?If all of the above answers are "NO", then may proceed with Cephalosporin use. ?  ? ? ?Current Outpatient Medications:  ?  acetaminophen (TYLENOL) 500 MG tablet, Take 500 mg by mouth every 6 (six) hours as needed for moderate pain., Disp: , Rfl:  ?  metoprolol succinate (TOPROL-XL) 50 MG 24 hr tablet, Take 0.5 tablets (25 mg total) by mouth daily., Disp: , Rfl:  ?  Multiple Vitamins-Minerals (ONE-A-DAY VITACRAVES ADULT) CHEW, Chew 1 tablet by mouth daily., Disp: , Rfl:  ?  torsemide (DEMADEX) 20 MG tablet, Take 20 mg by mouth daily., Disp: , Rfl:  ?  traMADol (ULTRAM) 50 MG tablet, Take 50 mg by mouth every 8 (eight) hours as needed for moderate pain., Disp: , Rfl:  ?  warfarin (COUMADIN) 2.5 MG tablet, Take 1 tablet (2.5 mg total) by mouth daily., Disp: 30 tablet, Rfl: 3 ?Past Medical History:  ?Diagnosis Date  ? CHF (congestive heart failure) ([place])   ? Heart failure ([place])   ? Hypertension   ?  Morbid obesity ([place])   ? Multiple sclerosis ([place])   ? Paroxysmal atrial fibrillation ([facility])   ? ? ?ASSESSMENT ? ?Recent Results: ?The most recent result is correlated with 22.5 mg per week: ? ?Lab Results  ?Component Value Date  ? INR 2.3 11/16/2021  ? INR 1.6 (A) 11/09/2021  ? INR 1.3 (H) 11/03/2021  ? ? ?Anticoagulation Dosing: ?Description   ?INR at goal. Continue current weekly dose of 5 mg every Sun, Tues, and 2.5 mg all other days. Recheck INR in 1 week. ?  ?  ?INR today: Therapeutic. INR improved following recent weekly dose adjustment. No recurrence of bleeding concerns since. Denies any  recent changes in diet, medications, or lifestyle. Denies any other complains of bleeding or bruising symptoms. Reviewed with Dr. Einar Gip regarding the benefit vs risk of continuing warfarin in setting of repeated GI bleeding episodes. Pt stated that she isnt able to come to the office yet to review her plans regarding making that decision yet. Pt to discuss with her providers before making that decision.  ?  ?PLAN ?Weekly dose was unchanged by 0% to 22.5 mg/week. Continue current weekly dose of 5 mg every Sun, Tues, 2.5 mg all other days. Continue to maintain 3-4 servings of high Vit K/week. Recheck INR in 1 week. ? ?Patient Instructions  ?INR at goal. Continue current weekly dose of 5 mg every Sun, Tues, and 2.5 mg all other days. Recheck INR in 1 week. ?Patient advised to contact clinic or seek medical attention if signs/symptoms of bleeding or thromboembolism occur. ? ?Patient verbalized understanding by repeating back information and was advised to contact me if further medication-related questions arise.  ? ?Follow-up ?Return in about 1 week (around 11/23/2021). ? ?Alysia Penna, PharmD ? ?15 minutes spent face-to-face with the patient during the encounter. 50% of time spent on education, including signs/sx bleeding and clotting, as well as food and drug interactions with warfarin. 50% of time was spent on fingerprick POC INR sample collection,processing, results determination, and documentation ?

## 2021-11-16 NOTE — Patient Instructions (Signed)
INR at goal. Continue current weekly dose of 5 mg every Sun, Tues, and 2.5 mg all other days. Recheck INR in 1 week. ?

## 2021-11-21 ENCOUNTER — Encounter: Payer: 59 | Admitting: Obstetrics and Gynecology

## 2021-11-30 ENCOUNTER — Other Ambulatory Visit: Payer: Self-pay

## 2021-11-30 LAB — PROTIME-INR

## 2021-12-07 ENCOUNTER — Ambulatory Visit: Payer: Self-pay | Admitting: Pharmacist

## 2021-12-07 DIAGNOSIS — I480 Paroxysmal atrial fibrillation: Secondary | ICD-10-CM

## 2021-12-07 DIAGNOSIS — Z5181 Encounter for therapeutic drug level monitoring: Secondary | ICD-10-CM

## 2021-12-07 LAB — POCT INR: INR: 2.4 (ref 2.0–3.0)

## 2021-12-07 NOTE — Progress Notes (Signed)
Anticoagulation Management ?Zoe Cobb is a 66 y.o. female who reports to the clinic for monitoring of warfarin treatment.  ? ?Indication: atrial fibrillation CHA2DS2 Vasc Score 3 (Age 61-74, Female, CHF hx), HAS-BLED 1 (Age >42)  ?Duration: indefinite ?Supervising physician: Adrian Prows ? ?Anticoagulation Clinic Visit History: ? ?Patient does not report signs/symptoms of bleeding or thromboembolism ? ?Other recent changes: Pt maintaining 4-5 serving of high Vit K meals/weeks.  ? ?Reports to having Vit K intake of broccoli, Kale, spinach, collard greens on a rotating bases.  ? ?Pt recently hospitalized on 3/28-3/30 for vaginal bleeding concerns. INR on admission of 3.5. Patient was given Kcentra and vitamin K 10 mg IV x1.  CBC showed hemoglobin of 7.4 g/dL. Patient received 3 units of PRBC. Pt was discharged on megesterol and was recommended to follow up with OBGYN. Pt unfortunately isnt able to get transportation due to her wheelchair needing repairs. Pt was discharged on 2.5 mg daily.  ? ?Pt also with hx of diverticular bleeding episode on 08/25/21. Pt hospitalized with acute blood loss anemia. Pt's INR during hospital admission of 4.1. Pt received Kcentra, Vit K and 5 units of PRBC. Hbg improved improved from 6.5 to 8.2 up on discharge. CTA did not show any active hemorrhage. Warfarin was held from 1/19-1/21. Pt denies any further episodes of GI bleeding concerns since. ? ?Pt still waiting for her wheelchair to be fixed in order to be able to schedule her follow up OV w/ Dr. Einar Gip and her OBGYN.  ? ?Anticoagulation Episode Summary   ? ? Current INR goal:  2.0-3.0  ?TTR:  52.0 % (3.1 y)  ?Next INR check:  12/14/2021  ?INR from last check:  2.4 (12/07/2021)  ?Weekly max warfarin dose:    ?Target end date:    ?INR check location:    ?Preferred lab:    ?Send INR reminders to:    ? Indications   ?Atrial fibrillation ([place]) [I48.91] ?Monitoring for long-term anticoagulant use (Resolved) [Z51.81 ?Z79.01] ? ?  ?  ? ?  Comments:    ?  ? ?  ? ? ?Allergies  ?Allergen Reactions  ? Penicillins Swelling  ?  Has patient had a PCN reaction causing immediate rash, facial/tongue/throat swelling, SOB or lightheadedness with hypotension: Yes ?Has patient had a PCN reaction causing severe rash involving mucus membranes or skin necrosis: No ?Has patient had a PCN reaction that required hospitalization No ?Has patient had a PCN reaction occurring within the last 10 years: No ?If all of the above answers are "NO", then may proceed with Cephalosporin use. ?  ? ? ?Current Outpatient Medications:  ?  acetaminophen (TYLENOL) 500 MG tablet, Take 500 mg by mouth every 6 (six) hours as needed for moderate pain., Disp: , Rfl:  ?  metoprolol succinate (TOPROL-XL) 50 MG 24 hr tablet, Take 0.5 tablets (25 mg total) by mouth daily., Disp: , Rfl:  ?  Multiple Vitamins-Minerals (ONE-A-DAY VITACRAVES ADULT) CHEW, Chew 1 tablet by mouth daily., Disp: , Rfl:  ?  torsemide (DEMADEX) 20 MG tablet, Take 20 mg by mouth daily., Disp: , Rfl:  ?  traMADol (ULTRAM) 50 MG tablet, Take 50 mg by mouth every 8 (eight) hours as needed for moderate pain., Disp: , Rfl:  ?  warfarin (COUMADIN) 2.5 MG tablet, Take 1 tablet (2.5 mg total) by mouth daily., Disp: 30 tablet, Rfl: 3 ?Past Medical History:  ?Diagnosis Date  ? CHF (congestive heart failure) ([place])   ? Heart failure ([place])   ?  Hypertension   ? Morbid obesity ([place])   ? Multiple sclerosis ([place])   ? Paroxysmal atrial fibrillation ([facility])   ? ? ?ASSESSMENT ? ?Recent Results: ?The most recent result is correlated with 22.5 mg per week: ? ?Lab Results  ?Component Value Date  ? INR 2.4 12/07/2021  ? INR 2.3 11/16/2021  ? INR 1.6 (A) 11/09/2021  ? ? ?Anticoagulation Dosing: ?Description   ?INR at goal. Continue current weekly dose of 5 mg every Sun, Tues, and 2.5 mg all other days. Recheck INR in 1 week. ?  ?  ?INR today: Therapeutic. INR continues to remain therapeutic on current weekly dose. No recurrence of bleeding concerns  since. Denies any recent changes in diet, medications, or lifestyle. Denies any other complains of bleeding or bruising symptoms. Will continue current weekly dose and continue close monitoring and follow up. ?  ?PLAN ?Weekly dose was unchanged by 0% to 22.5 mg/week. Continue current weekly dose of 5 mg every Sun, Tues, 2.5 mg all other days. Continue to maintain 3-4 servings of high Vit K/week. Recheck INR in 1 week. ? ?Patient Instructions  ?INR at goal. Continue current weekly dose of 5 mg every Sun, Tues, and 2.5 mg all other days. Recheck INR in 1 week. ?Patient advised to contact clinic or seek medical attention if signs/symptoms of bleeding or thromboembolism occur. ? ?Patient verbalized understanding by repeating back information and was advised to contact me if further medication-related questions arise.  ? ?Follow-up ?Return in about 1 week (around 12/14/2021). ? ?Alysia Penna, PharmD ? ?15 minutes spent face-to-face with the patient during the encounter. 50% of time spent on education, including signs/sx bleeding and clotting, as well as food and drug interactions with warfarin. 50% of time was spent on fingerprick POC INR sample collection,processing, results determination, and documentation ?

## 2021-12-07 NOTE — Patient Instructions (Signed)
INR at goal. Continue current weekly dose of 5 mg every Sun, Tues, and 2.5 mg all other days. Recheck INR in 1 week. ?

## 2021-12-14 ENCOUNTER — Ambulatory Visit: Payer: Self-pay | Admitting: Pharmacist

## 2021-12-14 DIAGNOSIS — I480 Paroxysmal atrial fibrillation: Secondary | ICD-10-CM

## 2021-12-14 DIAGNOSIS — Z5181 Encounter for therapeutic drug level monitoring: Secondary | ICD-10-CM

## 2021-12-14 LAB — POCT INR: INR: 1.9 — AB (ref 2.0–3.0)

## 2021-12-14 NOTE — Patient Instructions (Signed)
INR below goal. Increase weekly dose to 5 mg every Tues, Thurs, Sat and 2.5 mg all other days. Recheck INR in 1 week.  °

## 2021-12-14 NOTE — Progress Notes (Signed)
Anticoagulation Management ?Zoe Cobb is a 66 y.o. female who reports to the clinic for monitoring of warfarin treatment.  ? ?Indication: atrial fibrillation CHA2DS2 Vasc Score 3 (Age 54-74, Female, CHF hx), HAS-BLED 1 (Age >33)  ?Duration: indefinite ?Supervising physician: Adrian Prows ? ?Anticoagulation Clinic Visit History: ? ?Patient does not report signs/symptoms of bleeding or thromboembolism ? ?Other recent changes: Pt maintaining 4-5 serving of high Vit K meals/weeks.  ? ?Reports to having Vit K intake of broccoli, Kale, spinach, collard greens on a rotating bases.  ? ?Pt recently hospitalized on 3/28-3/30 for vaginal bleeding concerns. Pt hasnt followed up with her OBGYN yet. Pt unfortunately isnt able to get transportation due to her wheelchair needing repairs.  ? ?Pt still waiting for her wheelchair to be fixed in order to be able to schedule her follow up OV w/ Dr. Einar Gip and her OBGYN.  ? ?Anticoagulation Episode Summary   ? ? Current INR goal:  2.0-3.0  ?TTR:  52.1 % (3.1 y)  ?Next INR check:  12/21/2021  ?INR from last check:  1.9 (12/14/2021)  ?Weekly max warfarin dose:    ?Target end date:    ?INR check location:    ?Preferred lab:    ?Send INR reminders to:    ? Indications   ?Atrial fibrillation ([place]) [I48.91] ?Monitoring for long-term anticoagulant use (Resolved) [Z51.81 ?Z79.01] ? ?  ?  ? ? Comments:    ?  ? ?  ? ? ?Allergies  ?Allergen Reactions  ? Penicillins Swelling  ?  Has patient had a PCN reaction causing immediate rash, facial/tongue/throat swelling, SOB or lightheadedness with hypotension: Yes ?Has patient had a PCN reaction causing severe rash involving mucus membranes or skin necrosis: No ?Has patient had a PCN reaction that required hospitalization No ?Has patient had a PCN reaction occurring within the last 10 years: No ?If all of the above answers are "NO", then may proceed with Cephalosporin use. ?  ? ? ?Current Outpatient Medications:  ?  acetaminophen (TYLENOL) 500 MG tablet,  Take 500 mg by mouth every 6 (six) hours as needed for moderate pain., Disp: , Rfl:  ?  metoprolol succinate (TOPROL-XL) 50 MG 24 hr tablet, Take 0.5 tablets (25 mg total) by mouth daily., Disp: , Rfl:  ?  Multiple Vitamins-Minerals (ONE-A-DAY VITACRAVES ADULT) CHEW, Chew 1 tablet by mouth daily., Disp: , Rfl:  ?  torsemide (DEMADEX) 20 MG tablet, Take 20 mg by mouth daily., Disp: , Rfl:  ?  traMADol (ULTRAM) 50 MG tablet, Take 50 mg by mouth every 8 (eight) hours as needed for moderate pain., Disp: , Rfl:  ?  warfarin (COUMADIN) 2.5 MG tablet, Take 1 tablet (2.5 mg total) by mouth daily., Disp: 30 tablet, Rfl: 3 ?Past Medical History:  ?Diagnosis Date  ? CHF (congestive heart failure) ([place])   ? Heart failure ([place])   ? Hypertension   ? Morbid obesity ([place])   ? Multiple sclerosis ([place])   ? Paroxysmal atrial fibrillation ([facility])   ? ? ?ASSESSMENT ? ?Recent Results: ?The most recent result is correlated with 22.5 mg per week: ? ?Lab Results  ?Component Value Date  ? INR 1.9 (A) 12/14/2021  ? INR 2.4 12/07/2021  ? INR 2.3 11/16/2021  ? ? ?Anticoagulation Dosing: ?Description   ?INR below goal. Increase weekly dose to 5 mg every Tues, Thurs, Sat and 2.5 mg all other days. Recheck INR in 1 week ?  ?  ?INR today: Subtherapeutic. INR trending down down on current weekly  dose. No recurrence of bleeding concerns since. Denies any recent changes in diet, medications, or lifestyle. Denies any other complains of bleeding or bruising symptoms. Will increase weekly dose and continue close monitoring and follow up.  ?  ?PLAN ?Weekly dose was increased by 11.1% to 25 mg/week. Increase weekly dose to 5 mg every Tue, Thurs, Sat, 2.5 mg all other days. Continue to maintain 3-4 servings of high Vit K/week. Recheck INR in 1 week. ? ?Patient Instructions  ?INR below goal. Increase weekly dose to 5 mg every Tues, Thurs, Sat and 2.5 mg all other days. Recheck INR in 1 week ?Patient advised to contact clinic or seek medical attention if  signs/symptoms of bleeding or thromboembolism occur. ? ?Patient verbalized understanding by repeating back information and was advised to contact me if further medication-related questions arise.  ? ?Follow-up ?Return in about 1 week (around 12/21/2021). ? ?Alysia Penna, PharmD ? ?15 minutes spent face-to-face with the patient during the encounter. 50% of time spent on education, including signs/sx bleeding and clotting, as well as food and drug interactions with warfarin. 50% of time was spent on fingerprick POC INR sample collection,processing, results determination, and documentation ?

## 2021-12-20 ENCOUNTER — Ambulatory Visit: Payer: Self-pay | Admitting: Pharmacist

## 2021-12-20 DIAGNOSIS — I480 Paroxysmal atrial fibrillation: Secondary | ICD-10-CM

## 2021-12-20 DIAGNOSIS — Z7901 Long term (current) use of anticoagulants: Secondary | ICD-10-CM

## 2021-12-20 LAB — POCT INR: INR: 1.7 — AB (ref 2.0–3.0)

## 2021-12-20 NOTE — Patient Instructions (Signed)
INR below goal. Increase weekly dose to 2.5 mg every Mon, Wed, Fri and 5 mg all other days. Recheck INR in 1 week ?

## 2021-12-20 NOTE — Progress Notes (Signed)
Anticoagulation Management ?Zoe Cobb is a 66 y.o. female who reports to the clinic for monitoring of warfarin treatment.  ? ?Indication: atrial fibrillation CHA2DS2 Vasc Score 3 (Age 49-74, Female, CHF hx), HAS-BLED 1 (Age >61)  ?Duration: indefinite ?Supervising physician: Adrian Prows ? ?Anticoagulation Clinic Visit History: ? ?Patient does not report signs/symptoms of bleeding or thromboembolism ? ?Other recent changes: Pt maintaining 4-5 serving of high Vit K meals/weeks.  ? ?Reports to having Vit K intake of broccoli, Kale, spinach, collard greens on a rotating bases.  ? ?Pt recently hospitalized on 3/28-3/30 for vaginal bleeding concerns. Pt hasnt followed up with her OBGYN yet. Pt unfortunately isnt able to get transportation due to her wheelchair needing repairs.  ? ?Pt still waiting for her wheelchair to be fixed in order to be able to schedule her follow up OV w/ Dr. Einar Gip and her OBGYN.  ? ?Anticoagulation Episode Summary   ? ? Current INR goal:  2.0-3.0  ?TTR:  51.8 % (3.2 y)  ?Next INR check:  12/29/2021  ?INR from last check:  1.7 (12/20/2021)  ?Weekly max warfarin dose:    ?Target end date:    ?INR check location:    ?Preferred lab:    ?Send INR reminders to:    ? Indications   ?Atrial fibrillation ([place]) [I48.91] ?Monitoring for long-term anticoagulant use (Resolved) [Z51.81 ?Z79.01] ? ?  ?  ? ? Comments:    ?  ? ?  ? ? ?Allergies  ?Allergen Reactions  ? Penicillins Swelling  ?  Has patient had a PCN reaction causing immediate rash, facial/tongue/throat swelling, SOB or lightheadedness with hypotension: Yes ?Has patient had a PCN reaction causing severe rash involving mucus membranes or skin necrosis: No ?Has patient had a PCN reaction that required hospitalization No ?Has patient had a PCN reaction occurring within the last 10 years: No ?If all of the above answers are "NO", then may proceed with Cephalosporin use. ?  ? ? ?Current Outpatient Medications:  ?  acetaminophen (TYLENOL) 500 MG tablet,  Take 500 mg by mouth every 6 (six) hours as needed for moderate pain., Disp: , Rfl:  ?  metoprolol succinate (TOPROL-XL) 50 MG 24 hr tablet, Take 0.5 tablets (25 mg total) by mouth daily., Disp: , Rfl:  ?  Multiple Vitamins-Minerals (ONE-A-DAY VITACRAVES ADULT) CHEW, Chew 1 tablet by mouth daily., Disp: , Rfl:  ?  torsemide (DEMADEX) 20 MG tablet, Take 20 mg by mouth daily., Disp: , Rfl:  ?  traMADol (ULTRAM) 50 MG tablet, Take 50 mg by mouth every 8 (eight) hours as needed for moderate pain., Disp: , Rfl:  ?  warfarin (COUMADIN) 2.5 MG tablet, Take 1 tablet (2.5 mg total) by mouth daily., Disp: 30 tablet, Rfl: 3 ?Past Medical History:  ?Diagnosis Date  ? CHF (congestive heart failure) ([place])   ? Heart failure ([place])   ? Hypertension   ? Morbid obesity ([place])   ? Multiple sclerosis ([place])   ? Paroxysmal atrial fibrillation ([facility])   ? ? ?ASSESSMENT ? ?Recent Results: ?The most recent result is correlated with 25 mg per week: ? ?Lab Results  ?Component Value Date  ? INR 1.7 (A) 12/20/2021  ? INR 1.9 (A) 12/14/2021  ? INR 2.4 12/07/2021  ? ? ?Anticoagulation Dosing: ?Description   ?INR below goal. Increase weekly dose to 2.5 mg every Mon, Wed, Fri and 5 mg all other days. Recheck INR in 1 week ?  ?  ?INR today: Subtherapeutic. INR continues to trend down despite  recent weekly dose increase. No recurrence of bleeding concerns since. Denies any recent changes in diet, medications, or lifestyle. Denies any other complains of bleeding or bruising symptoms. Will increase weekly dose and continue close monitoring and follow up.  ?  ?PLAN ?Weekly dose was increased by 10% to 27.5 mg/week. Increase weekly dose to 2.5 mg every Mon, Wed, Fri and 5 mg all other days. Continue to maintain 3-4 servings of high Vit K/week. Recheck INR in 1 week. ? ?Patient Instructions  ?INR below goal. Increase weekly dose to 2.5 mg every Mon, Wed, Fri and 5 mg all other days. Recheck INR in 1 week ?Patient advised to contact clinic or seek medical  attention if signs/symptoms of bleeding or thromboembolism occur. ? ?Patient verbalized understanding by repeating back information and was advised to contact me if further medication-related questions arise.  ? ?Follow-up ?Return in about 9 days (around 12/29/2021). ? ?Alysia Penna, PharmD ? ?15 minutes spent face-to-face with the patient during the encounter. 50% of time spent on education, including signs/sx bleeding and clotting, as well as food and drug interactions with warfarin. 50% of time was spent on fingerprick POC INR sample collection,processing, results determination, and documentation ?

## 2021-12-29 ENCOUNTER — Ambulatory Visit: Payer: Self-pay | Admitting: Pharmacist

## 2021-12-29 DIAGNOSIS — I480 Paroxysmal atrial fibrillation: Secondary | ICD-10-CM

## 2021-12-29 DIAGNOSIS — Z5181 Encounter for therapeutic drug level monitoring: Secondary | ICD-10-CM

## 2021-12-29 LAB — POCT INR: INR: 2 (ref 2.0–3.0)

## 2021-12-29 NOTE — Progress Notes (Signed)
Anticoagulation Management Zoe [facility] is a 66 y.o. female who reports to the clinic for monitoring of warfarin treatment.   Indication: atrial fibrillation CHA2DS2 Vasc Score 3 (Age 76-74, Female, CHF hx), HAS-BLED 1 (Age >98)  Duration: indefinite Supervising physician: Adrian Prows  Anticoagulation Clinic Visit History:  Patient does not report signs/symptoms of bleeding or thromboembolism  Other recent changes: Pt maintaining 4-5 serving of high Vit K meals/weeks.   Reports to having Vit K intake of broccoli, Kale, spinach, collard greens on a rotating bases.   Pt recently hospitalized on 3/28-3/30 for vaginal bleeding concerns. Pt hasnt followed up with her OBGYN yet. Pt unfortunately isnt able to get transportation due to her wheelchair needing repairs.   Pt still waiting for her wheelchair to be fixed in order to be able to schedule her follow up OV w/ Dr. Einar Gip and her OBGYN.   Anticoagulation Episode Summary     Current INR goal:  2.0-3.0  TTR:  51.5 % (3.2 y)  Next INR check:  01/05/2022  INR from last check:  2.0 (12/29/2021)  Weekly max warfarin dose:    Target end date:    INR check location:    Preferred lab:    Send INR reminders to:     Indications   Atrial fibrillation (HCC) [I48.91] Monitoring for long-term anticoagulant use (Resolved) [Z51.81 Z79.01]        Comments:           Allergies  Allergen Reactions   Penicillins Swelling    Has patient had a PCN reaction causing immediate rash, facial/tongue/throat swelling, SOB or lightheadedness with hypotension: Yes Has patient had a PCN reaction causing severe rash involving mucus membranes or skin necrosis: No Has patient had a PCN reaction that required hospitalization No Has patient had a PCN reaction occurring within the last 10 years: No If all of the above answers are "NO", then may proceed with Cephalosporin use.     Current Outpatient Medications:    acetaminophen (TYLENOL) 500 MG tablet,  Take 500 mg by mouth every 6 (six) hours as needed for moderate pain., Disp: , Rfl:    metoprolol succinate (TOPROL-XL) 50 MG 24 hr tablet, Take 0.5 tablets (25 mg total) by mouth daily., Disp: , Rfl:    Multiple Vitamins-Minerals (ONE-A-DAY VITACRAVES ADULT) CHEW, Chew 1 tablet by mouth daily., Disp: , Rfl:    torsemide (DEMADEX) 20 MG tablet, Take 20 mg by mouth daily., Disp: , Rfl:    traMADol (ULTRAM) 50 MG tablet, Take 50 mg by mouth every 8 (eight) hours as needed for moderate pain., Disp: , Rfl:    warfarin (COUMADIN) 2.5 MG tablet, Take 1 tablet (2.5 mg total) by mouth daily., Disp: 30 tablet, Rfl: 3 Past Medical History:  Diagnosis Date   CHF (congestive heart failure) (HCC)    Heart failure (HCC)    Hypertension    Morbid obesity (HCC)    Multiple sclerosis (HCC)    Paroxysmal atrial fibrillation ([place])     ASSESSMENT  Recent Results: The most recent result is correlated with 25 mg per week:  Lab Results  Component Value Date   INR 2.0 12/29/2021   INR 1.7 (A) 12/20/2021   INR 1.9 (A) 12/14/2021    Anticoagulation Dosing: Description   INR at goal. Increase weekly dose to 2.5 mg every Mon, Wed and 5 mg all other days. Recheck INR in 1 week     INR today: Therapeutic. INR improved slightly following recent weekly dose  increase. INR still on the lower end of therapeutic range. Would prefer the INR to be close to ~2.5. No recurrence of bleeding concerns since. Denies any recent changes in diet, medications, or lifestyle. Denies any other complains of bleeding or bruising symptoms. Will increase weekly dose to ensure INR closer to 2.5 and continue routine follow up.    PLAN Weekly dose was increased by 9.1% to 30 mg/week. Increase weekly dose to 2.5 mg every Mon, Wed and 5 mg all other days. Continue to maintain 3-4 servings of high Vit K/week. Recheck INR in 1 week.  Patient Instructions  INR at goal. Increase weekly dose to 2.5 mg every Mon, Wed and 5 mg all other days.  Recheck INR in 1 week Patient advised to contact clinic or seek medical attention if signs/symptoms of bleeding or thromboembolism occur.  Patient verbalized understanding by repeating back information and was advised to contact me if further medication-related questions arise.   Follow-up Return in about 1 week (around 01/05/2022).  Alysia Penna, PharmD  15 minutes spent face-to-face with the patient during the encounter. 50% of time spent on education, including signs/sx bleeding and clotting, as well as food and drug interactions with warfarin. 50% of time was spent on fingerprick POC INR sample collection,processing, results determination, and documentation

## 2021-12-29 NOTE — Patient Instructions (Signed)
INR at goal. Increase weekly dose to 2.5 mg every Mon, Wed and 5 mg all other days. Recheck INR in 1 week

## 2022-03-10 ENCOUNTER — Emergency Department (HOSPITAL_COMMUNITY)
Admission: EM | Admit: 2022-03-10 | Discharge: 2022-03-10 | Disposition: A | Discharge status: Home / Self Care | Payer: 59 | Attending: Emergency Medicine | Admitting: Emergency Medicine

## 2022-03-10 ENCOUNTER — Emergency Department (HOSPITAL_COMMUNITY): Payer: 59

## 2022-03-10 ENCOUNTER — Other Ambulatory Visit: Payer: Self-pay

## 2022-03-10 ENCOUNTER — Encounter (HOSPITAL_COMMUNITY): Payer: Self-pay

## 2022-03-10 DIAGNOSIS — I509 Heart failure, unspecified: Secondary | ICD-10-CM | POA: Insufficient documentation

## 2022-03-10 DIAGNOSIS — K8020 Calculus of gallbladder without cholecystitis without obstruction: Secondary | ICD-10-CM | POA: Diagnosis not present

## 2022-03-10 DIAGNOSIS — I700 Atherosclerosis of aorta: Secondary | ICD-10-CM | POA: Insufficient documentation

## 2022-03-10 DIAGNOSIS — N939 Abnormal uterine and vaginal bleeding, unspecified: Secondary | ICD-10-CM | POA: Diagnosis present

## 2022-03-10 DIAGNOSIS — Z7901 Long term (current) use of anticoagulants: Secondary | ICD-10-CM | POA: Insufficient documentation

## 2022-03-10 LAB — CBC WITH DIFFERENTIAL/PLATELET
Abs Immature Granulocytes: 0 10*3/uL (ref 0.00–0.07)
Basophils Absolute: 0 10*3/uL (ref 0.0–0.1)
Basophils Relative: 0 %
Eosinophils Absolute: 0.5 10*3/uL (ref 0.0–0.5)
Eosinophils Relative: 7 %
HCT: 29.5 % — ABNORMAL LOW (ref 36.0–46.0)
Hemoglobin: 8.8 g/dL — ABNORMAL LOW (ref 12.0–15.0)
Lymphocytes Relative: 20 %
Lymphs Abs: 1.3 10*3/uL (ref 0.7–4.0)
MCH: 21.5 pg — ABNORMAL LOW (ref 26.0–34.0)
MCHC: 29.8 g/dL — ABNORMAL LOW (ref 30.0–36.0)
MCV: 72.1 fL — ABNORMAL LOW (ref 80.0–100.0)
Monocytes Absolute: 0.1 10*3/uL (ref 0.1–1.0)
Monocytes Relative: 2 %
Neutro Abs: 4.6 10*3/uL (ref 1.7–7.7)
Neutrophils Relative %: 71 %
Platelets: 292 10*3/uL (ref 150–400)
RBC: 4.09 MIL/uL (ref 3.87–5.11)
RDW: 21.2 % — ABNORMAL HIGH (ref 11.5–15.5)
WBC: 6.5 10*3/uL (ref 4.0–10.5)
nRBC: 0 % (ref 0.0–0.2)
nRBC: 0 /100 WBC

## 2022-03-10 LAB — COMPREHENSIVE METABOLIC PANEL
ALT: 10 U/L (ref 0–44)
AST: 17 U/L (ref 15–41)
Albumin: 2.1 g/dL — ABNORMAL LOW (ref 3.5–5.0)
Alkaline Phosphatase: 106 U/L (ref 38–126)
Anion gap: 6 (ref 5–15)
BUN: 19 mg/dL (ref 8–23)
CO2: 27 mmol/L (ref 22–32)
Calcium: 8.2 mg/dL — ABNORMAL LOW (ref 8.9–10.3)
Chloride: 105 mmol/L (ref 98–111)
Creatinine, Ser: 0.67 mg/dL (ref 0.44–1.00)
GFR, Estimated: >60 mL/min (ref >60)
Glucose, Bld: 114 mg/dL — ABNORMAL HIGH (ref 70–99)
Potassium: 3.8 mmol/L (ref 3.5–5.1)
Sodium: 138 mmol/L (ref 135–145)
Total Bilirubin: 0.7 mg/dL (ref 0.3–1.2)
Total Protein: 7.6 g/dL (ref 6.5–8.1)

## 2022-03-10 LAB — TYPE AND SCREEN
ABO/RH(D): A POS
Antibody Screen: NEGATIVE

## 2022-03-10 LAB — PROTIME-INR
INR: 3.9 — ABNORMAL HIGH (ref 0.8–1.2)
Prothrombin Time: 37.6 seconds — ABNORMAL HIGH (ref 11.4–15.2)

## 2022-03-10 MED ORDER — MEGESTROL ACETATE 40 MG PO TABS
40.0000 mg | ORAL_TABLET | Freq: Every day | ORAL | Status: DC
  Administered 2022-03-10: 40 mg via ORAL
  Filled 2022-03-10: qty 1

## 2022-03-10 MED ORDER — MEGESTROL ACETATE 40 MG PO TABS: 40.0000 mg | ORAL_TABLET | Freq: Two times a day (BID) | ORAL | 0 refills | Status: AC

## 2022-03-10 MED ORDER — IOHEXOL 300 MG/ML  SOLN
100.0000 mL | Freq: Once | INTRAMUSCULAR | Status: AC | PRN
  Administered 2022-03-10: 100 mL via INTRAVENOUS

## 2022-03-10 MED ORDER — SODIUM CHLORIDE 0.9 % IV BOLUS
500.0000 mL | Freq: Once | INTRAVENOUS | Status: AC
  Administered 2022-03-10: 500 mL via INTRAVENOUS

## 2022-03-10 NOTE — ED Triage Notes (Signed)
Pt bib [facility] from home with complaints of vaginal bleeding that started yesterday. Pt states that she had this happen before and she had to get two units of blood that time. Pt denies pain. VSS

## 2022-03-10 NOTE — Discharge Instructions (Signed)
Please call your PCP first thing Monday morning.  You should not take your Coumadin tomorrow.  Tell your PCP that your INR was 3.9 today.  Also mention your vaginal bleeding and that you are now on Megace.  Return with any worsening symptoms.  It was a pleasure to meet you and I hope that you feel better.  Be sure to make an appointment with OB/GYN as soon as you get your wheelchair, or sooner if you are able to find transportation.

## 2022-03-10 NOTE — ED Notes (Signed)
[facility] called at 13:52, no eta available

## 2022-03-10 NOTE — ED Provider Notes (Signed)
[facility] Provider Note   CSN: [id number] Arrival date & time: 03/10/22  2130     History  Chief Complaint  Patient presents with   Vaginal Bleeding    Zoe Cobb is a 66 y.o. female with a past medical history of morbid obesity currently wheelchair-bound, congestive heart failure and A-fib on Coumadin presenting today with vaginal bleeding.  She reports that yesterday she was receiving a bed bath from her aide and she noted that she was bleeding.  Her second set of aide came in and said that she was bleeding harder.  Because of this she did not take her Coumadin yesterday or today.  She is not passing any clots.  No palpitations, dizziness, shortness of breath.  No urinary symptoms.  No melena/hematochezia.  INR reportedly 1.2 on Wednesday.   Vaginal Bleeding Associated symptoms: no dizziness, no dysuria, no nausea and no vaginal discharge        Home Medications Prior to Admission medications   Medication Sig Start Date End Date Taking? Authorizing Provider  acetaminophen (TYLENOL) 500 MG tablet Take 500 mg by mouth every 6 (six) hours as needed for moderate pain.    [provider]  metoprolol succinate (TOPROL-XL) 50 MG 24 hr tablet Take 0.5 tablets (25 mg total) by mouth daily. 08/29/21   Calvert Cantor, MD  Multiple Vitamins-Minerals (ONE-A-DAY VITACRAVES ADULT) CHEW Chew 1 tablet by mouth daily.    [provider]  torsemide (DEMADEX) 20 MG tablet Take 20 mg by mouth daily.    [provider]  traMADol (ULTRAM) 50 MG tablet Take 50 mg by mouth every 8 (eight) hours as needed for moderate pain. 01/17/21   [provider]  warfarin (COUMADIN) 2.5 MG tablet Take 1 tablet (2.5 mg total) by mouth daily. 11/03/21 03/03/22  Meredeth Ide, MD      Allergies    Penicillins    Review of Systems   Review of Systems  Respiratory:  Negative for shortness of breath.   Cardiovascular:  Negative for  palpitations.  Gastrointestinal:  Negative for blood in stool, diarrhea, nausea and vomiting.  Genitourinary:  Positive for vaginal bleeding. Negative for dysuria, genital sores, hematuria, vaginal discharge and vaginal pain.  Neurological:  Negative for dizziness and syncope.    Physical Exam Updated Vital Signs BP 100/67 (BP Location: Right Wrist)   Pulse 71   Temp 98 F (36.7 C) (Oral)   Resp 20   Ht 5\' 1"  (1.549 m)   Wt (!) 221.8 kg   SpO2 100%   BMI 92.40 kg/m  Physical Exam Vitals and nursing note reviewed. Exam conducted with a chaperone present.  Constitutional:      Appearance: Normal appearance.  HENT:     Head: Normocephalic and atraumatic.  Eyes:     General: No scleral icterus.    Conjunctiva/sclera: Conjunctivae normal.  Pulmonary:     Effort: Pulmonary effort is normal. No respiratory distress.  Abdominal:     General: Abdomen is flat.     Palpations: Abdomen is soft.  Genitourinary:    Exam position: Knee-chest position.     Comments: Physical exam performed with multiple RNs as chaperone.  Due to body habitus I was unable to get a speculum exam however I did not note any abnormal external findings.  No lesions or area of tenderness.  No ulcerations.  Dark red blood just outside the vaginal vault. Present on my internal exam as well.  Tan stool  in the rectum, no signs of bleeding. Skin:    Findings: No rash.  Neurological:     Mental Status: She is alert.  Psychiatric:        Mood and Affect: Mood normal.     ED Results / Procedures / Treatments   Labs (all labs ordered are listed, but only abnormal results are displayed) Labs Reviewed  CBC WITH DIFFERENTIAL/PLATELET - Abnormal; Notable for the following components:      Result Value   Hemoglobin 8.8 (*)    HCT 29.5 (*)    MCV 72.1 (*)    MCH 21.5 (*)    MCHC 29.8 (*)    RDW 21.2 (*)    All other components within normal limits  PROTIME-INR - Abnormal; Notable for the following components:    Prothrombin Time 37.6 (*)    INR 3.9 (*)    All other components within normal limits  COMPREHENSIVE METABOLIC PANEL - Abnormal; Notable for the following components:   Glucose, Bld 114 (*)    Calcium 8.2 (*)    Albumin 2.1 (*)    All other components within normal limits  TYPE AND SCREEN    EKG None  Radiology CT ABDOMEN PELVIS W CONTRAST  Result Date: 03/10/2022 CLINICAL DATA:  Abdominal pain.  Vaginal bleeding. EXAM: CT ABDOMEN AND PELVIS WITH CONTRAST TECHNIQUE: Multidetector CT imaging of the abdomen and pelvis was performed using the standard protocol following bolus administration of intravenous contrast. RADIATION DOSE REDUCTION: This exam was performed according to the departmental dose-optimization program which includes automated exposure control, adjustment of the mA and/or kV according to patient size and/or use of iterative reconstruction technique. CONTRAST:  OMNIPAQUE IOHEXOL 300 MG/ML  SOLN COMPARISON:  08/26/2021 FINDINGS: Diminished exam detail secondary to patient body habitus. Lower chest: Patchy area of ground-glass attenuation noted within the right base may represent an area of early inflammation or infection versus dependent change. No pleural effusion identified. Hepatobiliary: No focal liver abnormality. There is relative hypertrophy of the lateral segment of left hepatic lobe and caudate lobe. Multiple stones identified within the gallbladder. No signs of gallbladder wall thickening or inflammation. No bile duct dilatation. Pancreas: Unremarkable. No pancreatic ductal dilatation or surrounding inflammatory changes. Spleen: Normal in size without focal abnormality. Adrenals/Urinary Tract: Normal adrenal glands. Simple appearing cyst arises off the upper pole of the right kidney measuring 1.8 cm. There is also a fat density lesion arising off the posterior cortex of the left kidney measuring 1.5 cm which may represent a benign angiomyolipoma. No follow-up imaging  recommended. Stable, heterogeneous, exophytic cyst arising off the lateral cortex of the inferior pole of the right kidney measuring 1.4 cm, image 42/4. This is considered not characterized by Bosniak criteria. No signs of nephrolithiasis or hydronephrosis. Urinary bladder is grossly unremarkable. Stomach/Bowel: Stomach appears normal. The appendix is visualized and appears normal. Sigmoid diverticulosis identified. No wall thickening, inflammation or distension. There is a large volume of desiccated stool within the rectum which may reflect constipation or fecal impaction, image 70/4. Vascular/Lymphatic: Aortic atherosclerosis without aneurysm. No abdominopelvic adenopathy. Reproductive: The uterus is normal in size. No adnexal mass identified. Assessment of the endometrium is not diagnostic on this exam. Other: No free fluid or fluid collections. Musculoskeletal: No acute or suspicious osseous findings. Unchanged chronic anterolisthesis L5 on S1 measuring approximately 1.5 cm. Multi level disc space narrowing and endplate spurring noted. IMPRESSION: 1. Exam detail is diminished secondary to patient body habitus. No acute findings within the abdomen  or pelvis. 2. Large volume of desiccated stool within the rectum may reflect constipation or fecal impaction. 3. Gallstones. 4. Patchy area of ground-glass attenuation within the right base may represent an area of early inflammation or infection versus dependent change. 5. The uterus and adnexal structures are grossly unremarkable. In the setting of abnormal uterine bleeding this exam is not diagnostic for the evaluation of the endometrium. 6. Aortic Atherosclerosis (ICD10-I70.0). Electronically Signed   By: Signa Kell M.D.   On: 03/10/2022 11:46    Procedures Procedures   Medications Ordered in ED Medications  megestrol (MEGACE) tablet 40 mg (40 mg Oral Given 03/10/22 1248)  iohexol (OMNIPAQUE) 300 MG/ML solution 100 mL (100 mLs Intravenous Contrast Given  03/10/22 1126)  sodium chloride 0.9 % bolus 500 mL (0 mLs Intravenous Stopped 03/10/22 1249)    ED Course/ Medical Decision Making/ A&P                           Medical Decision Making Amount and/or Complexity of Data Reviewed Labs: ordered. Radiology: ordered.  Risk Prescription drug management.   This patient presents to the ED for concern of postmenopausal vaginal bleeding.  Differential includes but is not limited to endometrial cancer, vaginal atrophy, ovarian cyst, estrogen/progesterone abnormality.   This is not an exhaustive differential.    Past Medical History / Co-morbidities / Social History: A-fib on warfarin, vaginal bleeding   Additional history: Per chart review patient had a similar presentation in March.  She ended up being admitted to the hospital for anemia in the setting of vaginal bleeding.  She was given Kcentra at that time.  OB/GYN had suggested Megace 40 mg twice daily and outpatient follow-up.  At that time no ultrasound was performed as it would be of little utility secondary to her body habitus.   Physical Exam: Pertinent physical exam findings include Morbidly obese Not tachycardic  Blood on vaginal exam appears dark red  Lab Tests: I ordered, and personally interpreted labs.  The pertinent results include:  Hemoglobin 8.8, hematocrit 29.5, these appear to be patient's baseline over the past year.  History of microcytic anemia. INR 3.9, 5/23 baselines around 1.5.   Imaging Studies: I ordered and independently visualized and interpreted CT abdomen pelvis which showed no obvious abnormalities leading to her bleeding.  There was questionable fecal impaction however on my rectal exam patient had no signs of this.  She is having bowel movements as normal.  Doubt true impaction.  Also with a questionable area reflecting pneumonia.  If stable vital signs, no shortness of breath, cough or other signs of pneumonia.  Low concern for this as well.    Medications: I ordered medication including fluid bolus and Megace. Reevaluation of the patient showed that she was no longer hypotensive.   Consultations Obtained: I spoke with Dr. Donavan Foil with OB/GYN.  He recommends Megace twice daily.  She is not a good candidate for intervention, her anticoagulant likely needs to be adjusted.  He says he will see her if she ends up being admitted to the hospital, otherwise she will follow-up outpatient.    Disposition: 66 year old female presenting with vaginal bleeding.  Is on warfarin for A-fib.  Had a similar episode in March for which she was admitted to the hospital and given Kcentra.  INR supratherapeutic today.  I spoke about her case with my attending, Dr. Wallace Cullens and OB/GYN.  Since her hemoglobin is stable and she is asymptomatic of  her anemia she will be discharged home with rapid follow-up with PCP.  Patient says she has an appointment with her PCP on Tuesday however I encouraged her to call them first thing Monday morning.  She will hold her next Coumadin dose and return with any worsening symptoms.  She is agreeable to this plan.  Admission was considered however patient is hemodynamically stable and her hemoglobin appears to be at her baseline microcytic anemia.  Stable for discharge home.     I discussed this case with my attending physician Dr. Wallace Cullens who cosigned this note including patient's presenting symptoms, physical exam, and planned diagnostics and interventions. Attending physician stated agreement with plan or made changes to plan which were implemented.     Final Clinical Impression(s) / ED Diagnoses Final diagnoses:  Abnormal vaginal bleeding    Rx / DC Orders ED Discharge Orders          Ordered    megestrol (MEGACE) 40 MG tablet  2 times daily        03/10/22 1333           Results and diagnoses were explained to the patient. Return precautions discussed in full. Patient had no additional questions and expressed complete  understanding.   This chart was dictated using voice recognition software.  Despite best efforts to proofread,  errors can occur which can change the documentation meaning.    Saddie Benders, PA-C 03/10/22 1338    Sloan Leiter, DO 03/16/22 2312

## 2022-03-14 ENCOUNTER — Telehealth: Payer: Self-pay | Admitting: Cardiology

## 2022-03-14 DIAGNOSIS — I480 Paroxysmal atrial fibrillation: Secondary | ICD-10-CM

## 2022-03-14 DIAGNOSIS — Z7901 Long term (current) use of anticoagulants: Secondary | ICD-10-CM

## 2022-03-14 NOTE — Telephone Encounter (Signed)
I discussed with the patient, we will call us if INR is <1.9 and/or >3.1 so we can follow-up on her home INR monitoring.  She is willing to do this until she is able to obtain her mechanized wheelchair so she can be more mobile.  This was a 18-minute telephone encounter, including 89 minutes of review of her medical records.    ICD-10-CM   1. Monitoring for long-term anticoagulant use  Z51.81    Z79.01     2. Paroxysmal atrial fibrillation (HCC)  I48.0        Yates Decamp, MD, [facility] 03/14/2022, 10:42 PM Office: [phone] Fax: [phone] Pager: [phone]

## 2022-03-14 NOTE — Telephone Encounter (Signed)
I called the patient today and discussed with her regarding fluctuation variation in INR.  Patient has normal renal function, it is best to either manage warfarin and INR at our office visit versus through third-party service or better yet to change her to Eliquis 5 mg p.o. twice daily.  Patient is willing to change, will arrange for an office visit.   Yates Decamp, MD, [facility] 03/14/2022, 1:52 PM Office: [phone] Fax: [phone] Pager: [phone]

## 2022-05-03 ENCOUNTER — Ambulatory Visit: Payer: Self-pay

## 2022-05-03 ENCOUNTER — Telehealth: Payer: Self-pay

## 2022-05-03 DIAGNOSIS — I480 Paroxysmal atrial fibrillation: Secondary | ICD-10-CM

## 2022-05-03 LAB — POCT INR: INR: 3.7 — AB (ref 2–3)

## 2022-05-03 NOTE — Telephone Encounter (Signed)
05/03/2022   INR today was 3.7. Patient goal level is (2-3) Prior dose was 2.5 mg Monday, Wednesday, and Friday and 5 mg all the other days. patient will continue on  2.5 mg Monday, Wednesday, and Friday and 5 mg all the other days. Pt will recheck INR 1 weeks on Mondays.  Patient is aware and understands.    Lab Results  Component Value Date   INR 3.7 (A) 05/03/2022   INR 3.9 (H) 03/10/2022   INR 2.0 12/29/2021

## 2022-05-03 NOTE — Progress Notes (Signed)
Description   INR above goal at 3.7. She has had fluctuation in INR. Continue weekly dose to 2.5 mg every Mon, Wed, Friday and 5 mg all other days at this time. Plan to schedule office to to discuss switching to Eliquis or having INR checked in office.

## 2022-05-10 ENCOUNTER — Ambulatory Visit: Payer: 59

## 2022-05-10 VITALS — BP 116/70 | HR 70 | Ht 61.0 in

## 2022-05-10 DIAGNOSIS — Z7901 Long term (current) use of anticoagulants: Secondary | ICD-10-CM

## 2022-05-10 DIAGNOSIS — I480 Paroxysmal atrial fibrillation: Secondary | ICD-10-CM

## 2022-05-10 LAB — POCT INR: INR: 3.1 — AB (ref 2–3)

## 2022-05-10 NOTE — Progress Notes (Signed)
Telephone visit note  Subjective:   Zoe Cobb, female    DOB: 09/22/1955, 66 y.o.   MRN: [id number]  No chief complaint on file.  I connected with the patient on 05/10/22 by a telephone call and verified that I am speaking with the correct person using two identifiers.     I offered the patient a video enabled application for a virtual visit. Unfortunately, this could not be accomplished due to technical difficulties/lack of video enabled phone/computer. I discussed the limitations of evaluation and management by telemedicine and the availability of in person appointments. The patient expressed understanding and agreed to proceed.   HPI  Zoe Cobb is a 66 y.o.  female with chronic diastolic heart failure, paroxysmal atrial fibrillation found in February 2018, and morbid obesity is essentially wheelchair-bound. Recently on 03/10/22 she presented to ED for vaginal bleeding. She has had vaginal bleeding in past on Xarelto and Warfarin. She held one dose of Warfarin and was advised to follow-up with PCP.  She is currently remains on Warfarin for atrial fibrillation and she monitors INR at home. Recent INR has been supratherapeutic. She is unable to come into office for visits since she has not yet obtained a mechanical wheelchair.   Past Medical History:  Diagnosis Date   CHF (congestive heart failure) (HCC)    Heart failure (HCC)    Hypertension    Morbid obesity (HCC)    Multiple sclerosis (HCC)    Paroxysmal atrial fibrillation ([place])    Past Surgical History:  Procedure Laterality Date   COLONOSCOPY WITH PROPOFOL N/A 12/15/2020   Procedure: COLONOSCOPY WITH PROPOFOL;  Surgeon: Ladene Artist, MD;  Location: [facility] ENDOSCOPY;  Service: Endoscopy;  Laterality: N/A;   Social History   Socioeconomic History   Marital status: Married    Spouse name: Not on file   Number of children: 2   Years of education: Not on file   Highest education level: Not on file  Occupational  History   Not on file  Tobacco Use   Smoking status: Former    Packs/day: 1.00    Years: 9.00    Total pack years: 9.00    Types: Cigarettes    Quit date: 39    Years since quitting: 27.7   Smokeless tobacco: Never  Vaping Use   Vaping Use: Never used  Substance and Sexual Activity   Alcohol use: No    Alcohol/week: 0.0 standard drinks of alcohol   Drug use: No   Sexual activity: Not on file  Other Topics Concern   Not on file  Social History Narrative   Pt lives by herself, she completed hs.    Social Determinants of Health   Financial Resource Strain: Not on file  Food Insecurity: Not on file  Transportation Needs: Not on file  Physical Activity: Not on file  Stress: Not on file  Social Connections: Not on file  Intimate Partner Violence: Not on file   Family History  Problem Relation Age of Onset   Hypertension Mother    Diabetes Mother    Stomach cancer Father    Kidney disease Sister    Diabetes Sister    Diabetes Sister    Heart attack Brother    HIV/AIDS Brother    Diabetes Brother    Current Outpatient Medications on File Prior to Visit  Medication Sig Dispense Refill   acetaminophen (TYLENOL) 500 MG tablet Take 500 mg by mouth every 6 (six) hours as needed for moderate  pain.     metoprolol succinate (TOPROL-XL) 50 MG 24 hr tablet Take 0.5 tablets (25 mg total) by mouth daily.     Multiple Vitamins-Minerals (ONE-A-DAY VITACRAVES ADULT) CHEW Chew 1 tablet by mouth daily.     torsemide (DEMADEX) 20 MG tablet Take 20 mg by mouth daily.     traMADol (ULTRAM) 50 MG tablet Take 50 mg by mouth every 8 (eight) hours as needed for moderate pain.     warfarin (COUMADIN) 2.5 MG tablet Take 1 tablet (2.5 mg total) by mouth daily. 30 tablet 3   No current facility-administered medications on file prior to visit.    Cardiovascular and other pertinent studies:  Echocardiogram 08/29/16: - Left ventricle: The cavity size was mildly dilated. Wall   thickness was  increased in a pattern of mild LVH. Systolic   function was normal. The estimated ejection fraction was in the   range of 55% to 60%. Wall motion was normal; there were no   regional wall motion abnormalities. Features are consistent with   a pseudonormal left ventricular filling pattern, with concomitant   abnormal relaxation and increased filling pressure (grade 2   diastolic dysfunction). - Mitral valve: Calcified annulus. - Left atrium: The atrium was moderately dilated. - Right atrium: The atrium was mildly dilated. - Pulmonary arteries: Systolic pressure was mildly increased. PA   peak pressure: 42 mm Hg (S).  Vascular Lower Extremity Venous US 08/25/21 RIGHT:  - No evidence of common femoral vein obstruction.   LEFT:  - There is no evidence of deep vein thrombosis in the lower extremity.  However, portions of this examination were limited- see technologist  comments above.  - No cystic structure found in the popliteal fossa.  EKG   External labs  Home INR 05/08/22- 3.1  ROS    There were no vitals filed for this visit.  Physical Exam: Not performed, as this is a telephone visit  Assessment & Recommendations:  Paroxysmal atrial fibrillation ([place]) Long term (current) use of anticoagulants She does have fluctuation in INR and she is unable to come to office for INR checks.  Plan was to start Eliquis 5mg  twice daily however, she recalls increase bleeding while on Eliquis during past hospitalization Given this, will continue Warfarin  Description   INR above goal at 3.7. She has had fluctuation in INR. Take 2.5mg  Thursday, Friday, and Saturday then continue with regular dosing of 2.5mg  MWF and 5mg  all other days.  Recheck INR on 05/15/22      Ernst Spell, AGNP-C 05/10/2022 Office: [phone] Fax: [phone]

## 2022-05-22 ENCOUNTER — Telehealth: Payer: Self-pay

## 2022-05-22 DIAGNOSIS — I480 Paroxysmal atrial fibrillation: Secondary | ICD-10-CM

## 2022-05-22 DIAGNOSIS — Z5181 Encounter for therapeutic drug level monitoring: Secondary | ICD-10-CM

## 2022-05-22 DIAGNOSIS — Z7901 Long term (current) use of anticoagulants: Secondary | ICD-10-CM

## 2022-05-22 LAB — POCT INR: INR: 3.6 — AB (ref 2–3)

## 2022-05-22 NOTE — Telephone Encounter (Signed)
Description   INR above goal at 3.6. Hold dose today. Then take 5mg  on Tuesday and Thursday and 2.5mg  all other days.  Recheck INR on 05/22/22

## 2022-05-22 NOTE — Telephone Encounter (Signed)
05/22/2022    INR today was 3.6. Patient goal level is (2.5-3.5)  patient will now start on 5 mg Thursday and 2.5 mg all the other days.   Pt will recheck INR 1 weeks on 10/23.  Patent is aware and will recheck it on Monday.  Lab Results      Component                Value               Date                      INR                      3.6 (A)             05/22/2022                INR                      3.1 (A)             05/08/2022                INR                      3.7 (A)             05/03/2022

## 2022-05-24 ENCOUNTER — Emergency Department (HOSPITAL_COMMUNITY)
Admission: EM | Admit: 2022-05-24 | Discharge: 2022-05-24 | Disposition: A | Discharge status: Home / Self Care | Payer: 59 | Attending: Emergency Medicine | Admitting: Emergency Medicine

## 2022-05-24 ENCOUNTER — Encounter (HOSPITAL_COMMUNITY): Payer: Self-pay

## 2022-05-24 ENCOUNTER — Other Ambulatory Visit: Payer: Self-pay

## 2022-05-24 DIAGNOSIS — R319 Hematuria, unspecified: Secondary | ICD-10-CM | POA: Diagnosis present

## 2022-05-24 DIAGNOSIS — I509 Heart failure, unspecified: Secondary | ICD-10-CM | POA: Insufficient documentation

## 2022-05-24 DIAGNOSIS — Z79899 Other long term (current) drug therapy: Secondary | ICD-10-CM | POA: Insufficient documentation

## 2022-05-24 DIAGNOSIS — I110 Hypertensive heart disease with heart failure: Secondary | ICD-10-CM | POA: Diagnosis not present

## 2022-05-24 DIAGNOSIS — Z7901 Long term (current) use of anticoagulants: Secondary | ICD-10-CM | POA: Diagnosis not present

## 2022-05-24 DIAGNOSIS — D72829 Elevated white blood cell count, unspecified: Secondary | ICD-10-CM | POA: Diagnosis not present

## 2022-05-24 DIAGNOSIS — N390 Urinary tract infection, site not specified: Secondary | ICD-10-CM | POA: Diagnosis not present

## 2022-05-24 LAB — URINALYSIS, ROUTINE W REFLEX MICROSCOPIC
Bilirubin Urine: NEGATIVE
Glucose, UA: NEGATIVE mg/dL
Ketones, ur: NEGATIVE mg/dL
Nitrite: NEGATIVE
Protein, ur: 30 mg/dL — AB
RBC / HPF: >50 RBC/hpf — ABNORMAL HIGH (ref 0–5)
Specific Gravity, Urine: 1.013 (ref 1.005–1.030)
pH: 6 (ref 5.0–8.0)

## 2022-05-24 LAB — CBC
HCT: 33.9 % — ABNORMAL LOW (ref 36.0–46.0)
Hemoglobin: 9.7 g/dL — ABNORMAL LOW (ref 12.0–15.0)
MCH: 21.3 pg — ABNORMAL LOW (ref 26.0–34.0)
MCHC: 28.6 g/dL — ABNORMAL LOW (ref 30.0–36.0)
MCV: 74.3 fL — ABNORMAL LOW (ref 80.0–100.0)
Platelets: 347 10*3/uL (ref 150–400)
RBC: 4.56 MIL/uL (ref 3.87–5.11)
RDW: 19.2 % — ABNORMAL HIGH (ref 11.5–15.5)
WBC: 10.6 10*3/uL — ABNORMAL HIGH (ref 4.0–10.5)
nRBC: 0 % (ref 0.0–0.2)

## 2022-05-24 LAB — BASIC METABOLIC PANEL
Anion gap: 8 (ref 5–15)
BUN: 19 mg/dL (ref 8–23)
CO2: 28 mmol/L (ref 22–32)
Calcium: 8.7 mg/dL — ABNORMAL LOW (ref 8.9–10.3)
Chloride: 105 mmol/L (ref 98–111)
Creatinine, Ser: 0.63 mg/dL (ref 0.44–1.00)
GFR, Estimated: >60 mL/min (ref >60)
Glucose, Bld: 105 mg/dL — ABNORMAL HIGH (ref 70–99)
Potassium: 3.4 mmol/L — ABNORMAL LOW (ref 3.5–5.1)
Sodium: 141 mmol/L (ref 135–145)

## 2022-05-24 LAB — PROTIME-INR
INR: 3.5 — ABNORMAL HIGH (ref 0.8–1.2)
Prothrombin Time: 34.7 seconds — ABNORMAL HIGH (ref 11.4–15.2)

## 2022-05-24 MED ORDER — FOSFOMYCIN TROMETHAMINE 3 G PO PACK
3.0000 g | PACK | Freq: Once | ORAL | Status: AC
  Administered 2022-05-24: 3 g via ORAL
  Filled 2022-05-24: qty 3

## 2022-05-24 NOTE — Discharge Instructions (Addendum)
As discussed, your evaluation today has been largely reassuring.  But, it is important that you monitor your condition carefully, and do not hesitate to return to the ED if you develop new, or concerning changes in your condition. ? ?Otherwise, please follow-up with your physician for appropriate ongoing care. ? ?

## 2022-05-24 NOTE — ED Provider Notes (Signed)
[facility] DEPT Provider Note   CSN: [id number] Arrival date & time: 05/24/22  1032     History  Chief Complaint  Patient presents with   Hematuria    Zoe Cobb is a 66 y.o. female.  HPI Adult female with multiple medical issues including hypertension, heart failure, A-fib, obesity presents with dysuria and difficulty initiating urination.  There is associated hematuria.  There is some lower abdominal discomfort, but no upper abdominal pain, no chest pain, no systemic complaints.  She was well prior to the onset a few days ago.    Home Medications Prior to Admission medications   Medication Sig Start Date End Date Taking? Authorizing Provider  acetaminophen (TYLENOL) 500 MG tablet Take 500 mg by mouth every 6 (six) hours as needed for moderate pain.    [provider]  isosorbide mononitrate (IMDUR) 60 MG 24 hr tablet Take 60 mg by mouth daily. 05/04/22   [provider]  metoprolol succinate (TOPROL-XL) 50 MG 24 hr tablet Take 0.5 tablets (25 mg total) by mouth daily. 08/29/21   Debbe Odea, MD  Multiple Vitamins-Minerals (ONE-A-DAY VITACRAVES ADULT) CHEW Chew 1 tablet by mouth daily.    [provider]  torsemide (DEMADEX) 20 MG tablet Take 20 mg by mouth daily.    [provider]  traMADol (ULTRAM) 50 MG tablet Take 50 mg by mouth every 8 (eight) hours as needed for moderate pain. 01/17/21   [provider]  warfarin (COUMADIN) 2.5 MG tablet Take 1 tablet (2.5 mg total) by mouth daily. 11/03/21 05/10/22  Oswald Hillock, MD      Allergies    Penicillins    Review of Systems   Review of Systems  All other systems reviewed and are negative.   Physical Exam Updated Vital Signs BP 97/85   Pulse 72   Temp 98.3 F (36.8 C)   Resp 18   SpO2 100%  Physical Exam Vitals and nursing note reviewed.  Constitutional:      General: She is not in acute distress.    Appearance: She is well-developed.  She is obese.  HENT:     Head: Normocephalic and atraumatic.  Eyes:     Conjunctiva/sclera: Conjunctivae normal.  Pulmonary:     Effort: Pulmonary effort is normal. No respiratory distress.     Breath sounds: No stridor.  Abdominal:     General: There is no distension.     Comments: Mild tenderness lower abdomen  Skin:    General: Skin is warm and dry.  Neurological:     Mental Status: She is alert and oriented to person, place, and time.     Cranial Nerves: No cranial nerve deficit.  Psychiatric:        Mood and Affect: Mood normal.     ED Results / Procedures / Treatments   Labs (all labs ordered are listed, but only abnormal results are displayed) Labs Reviewed  URINALYSIS, ROUTINE W REFLEX MICROSCOPIC - Abnormal; Notable for the following components:      Result Value   APPearance CLOUDY (*)    Hgb urine dipstick LARGE (*)    Protein, ur 30 (*)    Leukocytes,Ua SMALL (*)    RBC / HPF >50 (*)    Bacteria, UA FEW (*)    All other components within normal limits  CBC - Abnormal; Notable for the following components:   WBC 10.6 (*)    Hemoglobin 9.7 (*)    HCT 33.9 (*)  MCV 74.3 (*)    MCH 21.3 (*)    MCHC 28.6 (*)    RDW 19.2 (*)    All other components within normal limits  PROTIME-INR - Abnormal; Notable for the following components:   Prothrombin Time 34.7 (*)    INR 3.5 (*)    All other components within normal limits  BASIC METABOLIC PANEL - Abnormal; Notable for the following components:   Potassium 3.4 (*)    Glucose, Bld 105 (*)    Calcium 8.7 (*)    All other components within normal limits    EKG None  Radiology No results found.  Procedures Procedures    Medications Ordered in ED Medications  fosfomycin (MONUROL) packet 3 g (has no administration in time range)    ED Course/ Medical Decision Making/ A&P This patient with a Hx of multiple medical issues including heart failure, hypertension, morbid obesity, A-fib on anticoagulation  presents to the ED for concern of dysuria, hematuria, lower abdominal pain, this involves an extensive number of treatment options, and is a complaint that carries with it a high risk of complications and morbidity.    The differential diagnosis includes urinary tension, urinary tract infection, bacteremia, sepsis.  Absent other systemic complaints, these are lower suspicion.  Patient has no upper abdominal pain, no chest pain, low suspicion for ACS   Social Determinants of Health:  Morbid obesity  Additional history obtained:  Additional history and/or information obtained from chart review, notable for cardiology office visit from August this year long-term anticoagulation monitoring, A-fib, otherwise unremarkable   After the initial evaluation, orders, including: Labs urinalysis bladder scan were initiated.   Patient placed on Cardiac and Pulse-Oximetry Monitors. The patient was maintained on a cardiac monitor.  The cardiac monitored showed an rhythm of 65 sinus normal The patient was also maintained on pulse oximetry. The readings were typically 100% room air normal   On repeat evaluation of the patient improved 4:51 PM Patient smiling Lab Tests:  I personally interpreted labs.  The pertinent results include: No renal dysfunction, trivial hypokalemia, urinalysis consistent with infection with leukocytes, bacteria, white cells, few squamous is also mild leukocytosis Dispostion / Final MDM:  After consideration of the diagnostic results and the patient's response to treatment, adult female with recurrent urinary tract infection presents with some hematuria, dysuria.  Patient is awake, alert, speaking clearly she is afebrile, no distress, little evidence for bacteremia, sepsis.  Some urinary tract abnormalities consistent with infection, and with mild leukocytosis she received antibiotics here.  Without other abnormal findings, patient appropriate for outpatient follow-up.  Final  Clinical Impression(s) / ED Diagnoses Final diagnoses:  Lower urinary tract infectious disease     Carmin Muskrat, MD 05/24/22 1651

## 2022-05-24 NOTE — ED Notes (Signed)
[facility] notified of need for pt transport 

## 2022-05-24 NOTE — ED Triage Notes (Signed)
Patient BIB [facility] from home. Started seeing blood in her urine since last night. Having pressure in her lower abdomen. Feeling the urge to urinate but cannot go. Had this happen before and it was a UTI. Patient is bed bound.

## 2022-05-24 NOTE — ED Notes (Signed)
Pt incontinent of urine prior to bladder scan.

## 2022-05-25 ENCOUNTER — Emergency Department (HOSPITAL_COMMUNITY)
Admission: EM | Admit: 2022-05-25 | Discharge: 2022-05-26 | Disposition: A | Discharge status: Home / Self Care | Payer: 59 | Attending: Emergency Medicine | Admitting: Emergency Medicine

## 2022-05-25 ENCOUNTER — Emergency Department (HOSPITAL_COMMUNITY): Payer: 59

## 2022-05-25 ENCOUNTER — Other Ambulatory Visit: Payer: Self-pay

## 2022-05-25 ENCOUNTER — Encounter (HOSPITAL_COMMUNITY): Payer: Self-pay

## 2022-05-25 DIAGNOSIS — Z6841 Body Mass Index (BMI) 40.0 and over, adult: Secondary | ICD-10-CM | POA: Diagnosis not present

## 2022-05-25 DIAGNOSIS — Z7901 Long term (current) use of anticoagulants: Secondary | ICD-10-CM | POA: Insufficient documentation

## 2022-05-25 DIAGNOSIS — R339 Retention of urine, unspecified: Secondary | ICD-10-CM | POA: Insufficient documentation

## 2022-05-25 DIAGNOSIS — R1030 Lower abdominal pain, unspecified: Secondary | ICD-10-CM | POA: Diagnosis not present

## 2022-05-25 DIAGNOSIS — N939 Abnormal uterine and vaginal bleeding, unspecified: Secondary | ICD-10-CM | POA: Diagnosis not present

## 2022-05-25 DIAGNOSIS — I110 Hypertensive heart disease with heart failure: Secondary | ICD-10-CM | POA: Insufficient documentation

## 2022-05-25 DIAGNOSIS — I509 Heart failure, unspecified: Secondary | ICD-10-CM | POA: Insufficient documentation

## 2022-05-25 DIAGNOSIS — D649 Anemia, unspecified: Secondary | ICD-10-CM | POA: Insufficient documentation

## 2022-05-25 LAB — URINALYSIS, ROUTINE W REFLEX MICROSCOPIC
Bacteria, UA: NONE SEEN
Bilirubin Urine: NEGATIVE
Glucose, UA: NEGATIVE mg/dL
Ketones, ur: NEGATIVE mg/dL
Leukocytes,Ua: NEGATIVE
Nitrite: NEGATIVE
Protein, ur: NEGATIVE mg/dL
Specific Gravity, Urine: 1.021 (ref 1.005–1.030)
pH: 6 (ref 5.0–8.0)

## 2022-05-25 LAB — CBC WITH DIFFERENTIAL/PLATELET
Abs Immature Granulocytes: 0.02 10*3/uL (ref 0.00–0.07)
Basophils Absolute: 0 10*3/uL (ref 0.0–0.1)
Basophils Relative: 1 %
Eosinophils Absolute: 0.2 10*3/uL (ref 0.0–0.5)
Eosinophils Relative: 2 %
HCT: 33.5 % — ABNORMAL LOW (ref 36.0–46.0)
Hemoglobin: 9.9 g/dL — ABNORMAL LOW (ref 12.0–15.0)
Immature Granulocytes: 0 %
Lymphocytes Relative: 18 %
Lymphs Abs: 1.6 10*3/uL (ref 0.7–4.0)
MCH: 22 pg — ABNORMAL LOW (ref 26.0–34.0)
MCHC: 29.6 g/dL — ABNORMAL LOW (ref 30.0–36.0)
MCV: 74.3 fL — ABNORMAL LOW (ref 80.0–100.0)
Monocytes Absolute: 0.6 10*3/uL (ref 0.1–1.0)
Monocytes Relative: 7 %
Neutro Abs: 6.3 10*3/uL (ref 1.7–7.7)
Neutrophils Relative %: 72 %
Platelets: 336 10*3/uL (ref 150–400)
RBC: 4.51 MIL/uL (ref 3.87–5.11)
RDW: 19.3 % — ABNORMAL HIGH (ref 11.5–15.5)
WBC: 8.6 10*3/uL (ref 4.0–10.5)
nRBC: 0 % (ref 0.0–0.2)

## 2022-05-25 LAB — BASIC METABOLIC PANEL
Anion gap: 9 (ref 5–15)
BUN: 11 mg/dL (ref 8–23)
CO2: 27 mmol/L (ref 22–32)
Calcium: 8.9 mg/dL (ref 8.9–10.3)
Chloride: 105 mmol/L (ref 98–111)
Creatinine, Ser: 0.59 mg/dL (ref 0.44–1.00)
GFR, Estimated: >60 mL/min (ref >60)
Glucose, Bld: 98 mg/dL (ref 70–99)
Potassium: 3.8 mmol/L (ref 3.5–5.1)
Sodium: 141 mmol/L (ref 135–145)

## 2022-05-25 LAB — APTT: aPTT: 51 seconds — ABNORMAL HIGH (ref 24–36)

## 2022-05-25 LAB — PROTIME-INR
INR: 3.1 — ABNORMAL HIGH (ref 0.8–1.2)
Prothrombin Time: 31.5 seconds — ABNORMAL HIGH (ref 11.4–15.2)

## 2022-05-25 MED ORDER — SULFAMETHOXAZOLE-TRIMETHOPRIM 800-160 MG PO TABS
1.0000 | ORAL_TABLET | Freq: Once | ORAL | Status: AC
  Administered 2022-05-25: 1 via ORAL
  Filled 2022-05-25: qty 1

## 2022-05-25 MED ORDER — TAMSULOSIN HCL 0.4 MG PO CAPS: 0.4000 mg | ORAL_CAPSULE | Freq: Every day | ORAL | 0 refills | Status: AC

## 2022-05-25 MED ORDER — IOHEXOL 350 MG/ML SOLN
75.0000 mL | Freq: Once | INTRAVENOUS | Status: AC | PRN
  Administered 2022-05-25: 75 mL via INTRAVENOUS

## 2022-05-25 MED ORDER — SULFAMETHOXAZOLE-TRIMETHOPRIM 800-160 MG PO TABS: 1.0000 | ORAL_TABLET | Freq: Two times a day (BID) | ORAL | 0 refills | Status: AC

## 2022-05-25 NOTE — Discharge Instructions (Addendum)
You have abnormal uterine bleeding that needs follow-up with OB/GYN.  Hold your warfarin for the next 2 days until you talk to your PCP regarding your vaginal bleeding.  Call the phone numbers in your discharge paperwork to make an appointment with OB/GYN.  Follow-up with urology for your urinary retention.  Take the antibiotics as prescribed as well as the Flomax to help with your urination.  Come back if any severe worsening bleeding such as passing heavy clots and soaking through a pad every hour for 3 or more hours, fainting, high fevers, or any other symptoms concerning to you.

## 2022-05-25 NOTE — ED Notes (Signed)
Pt in CT, unable to obtain VS at this time. Will update upon return to room.

## 2022-05-25 NOTE — ED Notes (Signed)
Performed bedside cleaning; maximum assist required due to habitus. Pt has her personal hoyer sling underneath her body. Bed and linens were saturated with bloody, foul-smelling urine. Cleaned pt and changed bed sheets and incontinence pads. Placed purewick external catheter on pt and hooked to suction canister with immediate output noted. Pt tolerated well. Recommend 3+ assist for all patient care

## 2022-05-25 NOTE — ED Triage Notes (Signed)
Reports being seen at [facility] last night and dx with UTI. Given "liquid abx" and has not urinated since.   Suprapubic pain with urgency.

## 2022-05-25 NOTE — ED Provider Notes (Signed)
[facility] EMERGENCY DEPARTMENT Provider Note   CSN: [id number] Arrival date & time: 05/25/22  6301     History  Chief Complaint  Patient presents with   Urinary Retention    Zoe Cobb is a 66 y.o. female.  With PMH of HTN, A-fib on warfarin, CHF who was seen yesterday in the ED and diagnosed with UTI given fosfomycin who returns today with continued urinary symptoms and urinary retention and possible hematuria or vaginal bleeding.  Patient is here because she has had 3 to 4 days of ongoing pain with urination and urge to urinate but does not feel like she is emptying her bladder fully.  She has not been looking at her urine but has been told she has been passing bloody urine and clots.  However, she is unsure if this is from her urine or from her vagina.  She has never had this issue before however she has noted history of GI bleeding as she is on warfarin.  Her last dose of warfarin was this past Monday because she has been skipping doses since she has been in the hospital a few times for this issue.  She has had no fevers, nausea, vomiting, back pain or flank pain.  She has no history of kidney stones.  HPI     Home Medications Prior to Admission medications   Medication Sig Start Date End Date Taking? Authorizing Provider  acetaminophen (TYLENOL) 500 MG tablet Take 500 mg by mouth every 6 (six) hours as needed for moderate pain.   Yes [provider]  isosorbide mononitrate (IMDUR) 60 MG 24 hr tablet Take 60 mg by mouth daily. 05/04/22  Yes [provider]  Multiple Vitamins-Minerals (ONE A DAY WOMEN 50 PLUS PO) Take 1 tablet by mouth daily.   Yes [provider]  sulfamethoxazole-trimethoprim (BACTRIM DS) 800-160 MG tablet Take 1 tablet by mouth 2 (two) times daily for 5 days. 05/25/22 05/30/22 Yes Mardene Sayer, MD  tamsulosin (FLOMAX) 0.4 MG CAPS capsule Take 1 capsule (0.4 mg total) by mouth daily after supper for 10 days.  05/25/22 06/04/22 Yes Mardene Sayer, MD  torsemide (DEMADEX) 20 MG tablet Take 20 mg by mouth 2 (two) times daily.   Yes [provider]  traMADol (ULTRAM) 50 MG tablet Take 50 mg by mouth daily as needed for moderate pain. 01/17/21  Yes [provider]  metoprolol succinate (TOPROL-XL) 50 MG 24 hr tablet Take 0.5 tablets (25 mg total) by mouth daily. Patient taking differently: Take 75 mg by mouth daily. 08/29/21   Calvert Cantor, MD  warfarin (COUMADIN) 2.5 MG tablet Take 1 tablet (2.5 mg total) by mouth daily. Patient taking differently: Take 1.25-2.5 mg by mouth See admin instructions. Take 2.5 mg (1 tablet) by mouth on Monday, Wednesday, and Friday. On Sunday, Tuesday, Thursday, and Saturday, take 1.25 mg (1/2 tablet). 11/03/21 05/10/22  Meredeth Ide, MD      Allergies    Penicillins    Review of Systems   Review of Systems  Physical Exam Updated Vital Signs BP 129/80   Pulse 74   Temp 98.9 F (37.2 C)   Resp 19   Ht 5\' 1"  (1.549 m)   Wt (!) 221.8 kg   SpO2 100%   BMI 92.40 kg/m  Physical Exam Constitutional: Alert and oriented.  Nontoxic, no acute distress, pleasant Eyes: Conjunctivae are normal. ENT      Head: Normocephalic and atraumatic. Cardiovascular: S1, S2, regular rate  and rhythm Respiratory: Normal respiratory effort. Breath sounds are normal. Gastrointestinal: Morbidly obese and soft with suprapubic tenderness, no CVA tenderness, no rebound or guarding GU: Performed with the presence of nursing staff at bedside, significantly limited by excess body tissue but evidence of vaginal bleeding, no urinary bleeding, no external lesions Musculoskeletal: Moving extremities x4 Neurologic: Normal speech and language. No gross focal neurologic deficits are appreciated. Skin: Skin is warm, dry and intact. No rash noted. Psychiatric: Mood and affect are normal. Speech and behavior are normal.  ED Results / Procedures / Treatments   Labs (all labs  ordered are listed, but only abnormal results are displayed) Labs Reviewed  CBC WITH DIFFERENTIAL/PLATELET - Abnormal; Notable for the following components:      Result Value   Hemoglobin 9.9 (*)    HCT 33.5 (*)    MCV 74.3 (*)    MCH 22.0 (*)    MCHC 29.6 (*)    RDW 19.3 (*)    All other components within normal limits  URINALYSIS, ROUTINE W REFLEX MICROSCOPIC - Abnormal; Notable for the following components:   Hgb urine dipstick SMALL (*)    All other components within normal limits  PROTIME-INR - Abnormal; Notable for the following components:   Prothrombin Time 31.5 (*)    INR 3.1 (*)    All other components within normal limits  APTT - Abnormal; Notable for the following components:   aPTT 51 (*)    All other components within normal limits  URINE CULTURE  BASIC METABOLIC PANEL    EKG None  Radiology CT ABDOMEN PELVIS W CONTRAST  Result Date: 05/25/2022 CLINICAL DATA:  Recent UTI suprapubic pain EXAM: CT ABDOMEN AND PELVIS WITH CONTRAST TECHNIQUE: Multidetector CT imaging of the abdomen and pelvis was performed using the standard protocol following bolus administration of intravenous contrast. RADIATION DOSE REDUCTION: This exam was performed according to the departmental dose-optimization program which includes automated exposure control, adjustment of the mA and/or kV according to patient size and/or use of iterative reconstruction technique. CONTRAST:  30mL OMNIPAQUE IOHEXOL 350 MG/ML SOLN COMPARISON:  CT 03/10/2022 FINDINGS: Lower chest: Lung bases demonstrate no acute airspace disease. Hepatobiliary: Images are degraded by habitus. No focal hepatic abnormality. Multiple calcified gallstones. No biliary dilatation Pancreas: Unremarkable. No pancreatic ductal dilatation or surrounding inflammatory changes. Spleen: Normal in size without focal abnormality. Adrenals/Urinary Tract: Adrenal glands are normal. Simple cyst upper pole right kidney, no imaging follow-up recommended.  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 Midpole exophytic lesion measures 15 mm, this is indeterminate but grossly stable. Urinary bladder is unremarkable. Stomach/Bowel: The stomach is nonenlarged. No dilated small bowel. No acute bowel wall thickening. Negative appendix. Diverticular disease of the colon. Moderate rectal distension by formed feces Vascular/Lymphatic: Nonaneurysmal aorta. No suspicious lymph nodes. Mild atherosclerosis Reproductive: Uterus and bilateral adnexa are unremarkable. Other: Negative for pelvic effusion or free air. Musculoskeletal: No acute osseous abnormality. Similar grade 1 2 anterolisthesis L5 on S1 with advanced degenerative changes. IMPRESSION: 1. No CT evidence for acute intra-abdominal or pelvic abnormality. Images are slightly degraded by habitus. 2. Gallstones without inflammation in the right upper quadrant 3. Moderate rectal distension by formed feces. Electronically Signed   By: Jasmine Pang M.D.   On: 05/25/2022 21:51    Procedures Procedures  Remain on constant cardiac monitoring normal sinus rhythm.  Medications Ordered in ED Medications  iohexol (OMNIPAQUE) 350 MG/ML injection 75 mL (75 mLs Intravenous Contrast Given 05/25/22 2118)  sulfamethoxazole-trimethoprim (BACTRIM DS) 800-160 MG per tablet 1 tablet (1 tablet Oral Given 05/25/22 2343)    ED Course/ Medical Decision Making/ A&P                           Medical Decision Making Dashea Mcmullan is a 66 y.o. female.  With PMH of HTN, A-fib on warfarin, CHF who was seen yesterday in the ED and diagnosed with UTI given fosfomycin who returns today with continued urinary symptoms and urinary retention and possible hematuria or vaginal bleeding.    Based on patient's physical exam, her bleeding is vaginal in origin and with her age and morbid obesity, concern for possible endometrial hyperplasia or possible underlying endometrial cancer which I explained to  patient.  Her hemoglobin is 9.9 and stable for patient.  Up for 9.7 yesterday.  Her INR is slightly supratherapeutic 3.1.  She has no acute electrolyte abnormalities or concerning findings.  I obtained a CT scan to evaluate for kidney stone stone or mass which I personally also reviewed, there is no evidence of kidney stones, uterus abnormality or bladder abnormality.  Unclear underlying cause of her urinary retention but Foley catheter placed for relief of symptoms.  Discharging patient with referral to urology for urinary retention, Flomax to help with urinary retention, Bactrim for UTI prevention due to repeat attempts of the Foley due to her obesity, referral to OB/GYN and advise holding warfarin until discussion with PCP and OB/GYN due to vaginal bleeding.  Strict return precaution discussed.  Safe for discharge home.  Amount and/or Complexity of Data Reviewed Labs: ordered. Radiology: ordered.  Risk Prescription drug management.    Final Clinical Impression(s) / ED Diagnoses Final diagnoses:  Urinary retention  Anemia, unspecified type  Vaginal bleeding    Rx / DC Orders ED Discharge Orders          Ordered    Ambulatory referral to Gynecology        05/25/22 2256    Ambulatory referral to Urology        05/25/22 2256    sulfamethoxazole-trimethoprim (BACTRIM DS) 800-160 MG tablet  2 times daily        05/25/22 2256    tamsulosin (FLOMAX) 0.4 MG CAPS capsule  Daily after supper        05/25/22 2256              Elgie Congo, MD 05/26/22 0001

## 2022-05-26 LAB — URINE CULTURE: Culture: <10000 — AB

## 2022-05-31 ENCOUNTER — Telehealth: Payer: Self-pay

## 2022-05-31 NOTE — Telephone Encounter (Signed)
Called patient to inform her about her INR being normal, patient did not answer, left a vm

## 2022-06-06 ENCOUNTER — Telehealth: Payer: Self-pay

## 2022-06-06 NOTE — Telephone Encounter (Signed)
06/05/2022    INR today was 2.6. Patient goal level is (2-3)  Prior dose was 5 mg Thursday and 2.5 mg all the other days.  Patient will now start back on 5 mg Thursday, and 2.5 mg all the other days.   Pt will recheck INR 1 weeks on 11/06.   Lab Results  Component Value Date   INR 3.1 (H) 05/25/2022   INR 3.5 (H) 05/24/2022   INR 3.6 (A) 05/22/2022

## 2022-06-07 ENCOUNTER — Other Ambulatory Visit: Payer: Self-pay | Admitting: Cardiology

## 2022-06-07 DIAGNOSIS — Z5181 Encounter for therapeutic drug level monitoring: Secondary | ICD-10-CM

## 2022-06-07 DIAGNOSIS — I480 Paroxysmal atrial fibrillation: Secondary | ICD-10-CM

## 2022-06-12 ENCOUNTER — Telehealth: Payer: Self-pay

## 2022-06-12 NOTE — Telephone Encounter (Signed)
06/12/2022    INR today was 3.7. Patient goal level is (2-3)  Prior dose was 2.5 mg every Mon, Wed, Friday and 5 mg all other days at this time.   Patient will hold today and tomorrow and then continue 2.5 mg every Mon, Wed, Friday and 5 mg all other days at this time.   Pt will recheck INR 1 weeks on 11/13.  Patient mention that she has an appt Thursday 06/15/2022 with Dr, Einar Gip,  Lab Results  Component Value Date   INR 3.1 (H) 05/25/2022   INR 3.5 (H) 05/24/2022   INR 3.6 (A) 05/22/2022

## 2022-06-14 ENCOUNTER — Encounter (HOSPITAL_COMMUNITY): Payer: Self-pay | Admitting: Emergency Medicine

## 2022-06-14 ENCOUNTER — Emergency Department (HOSPITAL_COMMUNITY): Payer: 59

## 2022-06-14 ENCOUNTER — Emergency Department (HOSPITAL_COMMUNITY)
Admission: EM | Admit: 2022-06-14 | Discharge: 2022-06-15 | Disposition: A | Discharge status: Skilled Nursing Facility | Payer: 59 | Attending: Emergency Medicine | Admitting: Emergency Medicine

## 2022-06-14 ENCOUNTER — Other Ambulatory Visit: Payer: Self-pay

## 2022-06-14 DIAGNOSIS — D649 Anemia, unspecified: Secondary | ICD-10-CM | POA: Diagnosis not present

## 2022-06-14 DIAGNOSIS — K5909 Other constipation: Secondary | ICD-10-CM

## 2022-06-14 DIAGNOSIS — Z7901 Long term (current) use of anticoagulants: Secondary | ICD-10-CM | POA: Diagnosis not present

## 2022-06-14 DIAGNOSIS — K5641 Fecal impaction: Secondary | ICD-10-CM | POA: Insufficient documentation

## 2022-06-14 DIAGNOSIS — R109 Unspecified abdominal pain: Secondary | ICD-10-CM | POA: Diagnosis present

## 2022-06-14 DIAGNOSIS — I509 Heart failure, unspecified: Secondary | ICD-10-CM | POA: Diagnosis not present

## 2022-06-14 DIAGNOSIS — I110 Hypertensive heart disease with heart failure: Secondary | ICD-10-CM | POA: Insufficient documentation

## 2022-06-14 DIAGNOSIS — R600 Localized edema: Secondary | ICD-10-CM | POA: Insufficient documentation

## 2022-06-14 DIAGNOSIS — Z79899 Other long term (current) drug therapy: Secondary | ICD-10-CM | POA: Diagnosis not present

## 2022-06-14 LAB — URINALYSIS, ROUTINE W REFLEX MICROSCOPIC
Bilirubin Urine: NEGATIVE
Glucose, UA: NEGATIVE mg/dL
Ketones, ur: NEGATIVE mg/dL
Nitrite: NEGATIVE
Protein, ur: 30 mg/dL — AB
Specific Gravity, Urine: 1.034 — ABNORMAL HIGH (ref 1.005–1.030)
pH: 5 (ref 5.0–8.0)

## 2022-06-14 LAB — CBC WITH DIFFERENTIAL/PLATELET
Abs Immature Granulocytes: 0.04 10*3/uL (ref 0.00–0.07)
Basophils Absolute: 0 10*3/uL (ref 0.0–0.1)
Basophils Relative: 1 %
Eosinophils Absolute: 0.2 10*3/uL (ref 0.0–0.5)
Eosinophils Relative: 3 %
HCT: 31.3 % — ABNORMAL LOW (ref 36.0–46.0)
Hemoglobin: 9.1 g/dL — ABNORMAL LOW (ref 12.0–15.0)
Immature Granulocytes: 1 %
Lymphocytes Relative: 19 %
Lymphs Abs: 1.6 10*3/uL (ref 0.7–4.0)
MCH: 21.8 pg — ABNORMAL LOW (ref 26.0–34.0)
MCHC: 29.1 g/dL — ABNORMAL LOW (ref 30.0–36.0)
MCV: 75.1 fL — ABNORMAL LOW (ref 80.0–100.0)
Monocytes Absolute: 0.7 10*3/uL (ref 0.1–1.0)
Monocytes Relative: 8 %
Neutro Abs: 5.8 10*3/uL (ref 1.7–7.7)
Neutrophils Relative %: 68 %
Platelets: 359 10*3/uL (ref 150–400)
RBC: 4.17 MIL/uL (ref 3.87–5.11)
RDW: 21 % — ABNORMAL HIGH (ref 11.5–15.5)
WBC: 8.4 10*3/uL (ref 4.0–10.5)
nRBC: 0 % (ref 0.0–0.2)

## 2022-06-14 LAB — COMPREHENSIVE METABOLIC PANEL
ALT: 11 U/L (ref 0–44)
AST: 27 U/L (ref 15–41)
Albumin: 2.5 g/dL — ABNORMAL LOW (ref 3.5–5.0)
Alkaline Phosphatase: 104 U/L (ref 38–126)
Anion gap: 11 (ref 5–15)
BUN: 22 mg/dL (ref 8–23)
CO2: 26 mmol/L (ref 22–32)
Calcium: 8.5 mg/dL — ABNORMAL LOW (ref 8.9–10.3)
Chloride: 104 mmol/L (ref 98–111)
Creatinine, Ser: 0.88 mg/dL (ref 0.44–1.00)
GFR, Estimated: >60 mL/min (ref >60)
Glucose, Bld: 127 mg/dL — ABNORMAL HIGH (ref 70–99)
Potassium: 3.5 mmol/L (ref 3.5–5.1)
Sodium: 141 mmol/L (ref 135–145)
Total Bilirubin: 0.5 mg/dL (ref 0.3–1.2)
Total Protein: 8.1 g/dL (ref 6.5–8.1)

## 2022-06-14 LAB — PROTIME-INR
INR: 2.9 — ABNORMAL HIGH (ref 0.8–1.2)
Prothrombin Time: 30.2 seconds — ABNORMAL HIGH (ref 11.4–15.2)

## 2022-06-14 LAB — LIPASE, BLOOD: Lipase: 32 U/L (ref 11–51)

## 2022-06-14 MED ORDER — IOHEXOL 350 MG/ML SOLN
80.0000 mL | Freq: Once | INTRAVENOUS | Status: AC | PRN
  Administered 2022-06-14: 80 mL via INTRAVENOUS

## 2022-06-14 MED ORDER — SORBITOL 70 % SOLN
960.0000 mL | TOPICAL_OIL | Freq: Once | ORAL | Status: AC
  Administered 2022-06-14: 960 mL via RECTAL
  Filled 2022-06-14: qty 240

## 2022-06-14 NOTE — ED Notes (Signed)
IV team at bedside 

## 2022-06-14 NOTE — Discharge Instructions (Signed)
Take 8 scoops of miralax in 32oz of whatever you would like to drink.(Gatorade comes in this size) You can also use a fleets enema which you can buy over the counter at the pharmacy.  Return for worsening abdominal pain, vomiting or fever. ? ?

## 2022-06-14 NOTE — ED Notes (Addendum)
MD Haviland at bedside to remove fecal impaction prior to enema

## 2022-06-14 NOTE — ED Notes (Signed)
Patient transported to CT 

## 2022-06-14 NOTE — ED Provider Notes (Signed)
Received patient in turnover from Dr. Particia Nearing.  Please see their note for further details of Hx, PE.  Briefly patient is a 66 y.o. female with a Abdominal Pain .  Patient was found to have a very large stool burden.  Disimpacted at bedside.  Plan for an enema and to check UA.    UA negative for infection.  Patient feeling better and would like to go home.    Melene Plan, DO 06/14/22 2021

## 2022-06-14 NOTE — ED Provider Notes (Signed)
[facility] EMERGENCY DEPARTMENT Provider Note   CSN: [id number] Arrival date & time: 06/14/22  1105     History {Add pertinent medical, surgical, social history, OB history to HPI:1} Chief Complaint  Patient presents with   Abdominal Pain    Zoe Cobb is a 66 y.o. female.  Pt is a 66 yo female with a pmhx significant for HTN, CHF, p. Afib (on coumadin), MS, CHF, and morbid obesity.  Pt is wheelchair bound and from independent living.  Pt said she's had problems with a uti since October.  She's been taking abx, but she does not think they help.  She was given fosfomycin on 10/18 and was given bactrim on 10/19.  She also c/o pain in her rectum.  She said she's been moving her bowels well.       Home Medications Prior to Admission medications   Medication Sig Start Date End Date Taking? Authorizing Provider  acetaminophen (TYLENOL) 500 MG tablet Take 500 mg by mouth every 6 (six) hours as needed for moderate pain.    [provider]  isosorbide mononitrate (IMDUR) 60 MG 24 hr tablet Take 60 mg by mouth daily. 05/04/22   [provider]  metoprolol succinate (TOPROL-XL) 50 MG 24 hr tablet Take 0.5 tablets (25 mg total) by mouth daily. Patient taking differently: Take 75 mg by mouth daily. 08/29/21   Calvert Cantor, MD  Multiple Vitamins-Minerals (ONE A DAY WOMEN 50 PLUS PO) Take 1 tablet by mouth daily.    [provider]  torsemide (DEMADEX) 20 MG tablet Take 20 mg by mouth 2 (two) times daily.    [provider]  traMADol (ULTRAM) 50 MG tablet Take 50 mg by mouth daily as needed for moderate pain. 01/17/21   [provider]  warfarin (COUMADIN) 2.5 MG tablet Take 1 tablet (2.5 mg total) by mouth daily. Patient taking differently: Take 1.25-2.5 mg by mouth See admin instructions. Take 2.5 mg (1 tablet) by mouth on Monday, Wednesday, and Friday. On Sunday, Tuesday, Thursday, and Saturday, take 1.25 mg (1/2 tablet).  11/03/21 05/10/22  Meredeth Ide, MD      Allergies    Penicillins    Review of Systems   Review of Systems  Gastrointestinal:  Positive for abdominal pain and rectal pain.  All other systems reviewed and are negative.   Physical Exam Updated Vital Signs BP 99/65   Pulse 67   Temp 98 F (36.7 C) (Oral)   Resp 14   Ht 5\' 1"  (1.549 m)   Wt (!) 221.8 kg   SpO2 100%   BMI 92.39 kg/m  Physical Exam Vitals and nursing note reviewed.  Constitutional:      Appearance: She is well-developed. She is obese.  HENT:     Head: Normocephalic and atraumatic.  Eyes:     Extraocular Movements: Extraocular movements intact.     Pupils: Pupils are equal, round, and reactive to light.  Cardiovascular:     Rate and Rhythm: Normal rate and regular rhythm.     Heart sounds: Normal heart sounds.  Pulmonary:     Effort: Pulmonary effort is normal.     Breath sounds: Normal breath sounds.  Abdominal:     General: Abdomen is flat. Bowel sounds are normal.     Palpations: Abdomen is soft.     Tenderness: There is abdominal tenderness in the right lower quadrant, suprapubic area and left lower quadrant.  Musculoskeletal:     Right lower  leg: Edema present.     Left lower leg: Edema present.  Skin:    General: Skin is warm.     Capillary Refill: Capillary refill takes less than 2 seconds.  Neurological:     General: No focal deficit present.     Mental Status: She is alert and oriented to person, place, and time.  Psychiatric:        Mood and Affect: Mood normal.        Behavior: Behavior normal.     ED Results / Procedures / Treatments   Labs (all labs ordered are listed, but only abnormal results are displayed) Labs Reviewed  COMPREHENSIVE METABOLIC PANEL - Abnormal; Notable for the following components:      Result Value   Glucose, Bld 127 (*)    Calcium 8.5 (*)    Albumin 2.5 (*)    All other components within normal limits  PROTIME-INR - Abnormal; Notable for the following  components:   Prothrombin Time 30.2 (*)    INR 2.9 (*)    All other components within normal limits  CBC WITH DIFFERENTIAL/PLATELET - Abnormal; Notable for the following components:   Hemoglobin 9.1 (*)    HCT 31.3 (*)    MCV 75.1 (*)    MCH 21.8 (*)    MCHC 29.1 (*)    RDW 21.0 (*)    All other components within normal limits  LIPASE, BLOOD  CBC WITH DIFFERENTIAL/PLATELET  URINALYSIS, ROUTINE W REFLEX MICROSCOPIC    EKG EKG Interpretation  Date/Time:  Wednesday June 14 2022 11:19:55 EST Ventricular Rate:  66 PR Interval:  187 QRS Duration: 106 QT Interval:  445 QTC Calculation: 467 R Axis:   42 Text Interpretation: Sinus rhythm Low voltage, precordial leads No significant change since last tracing Confirmed by Isla Pence [phone]) on 06/14/2022 11:43:02 AM  Radiology CT ABDOMEN PELVIS W CONTRAST  Result Date: 06/14/2022 CLINICAL DATA:  Abdominal pain, acute. Patient reports he has been having issues with urinary tract infection since October. EXAM: CT ABDOMEN AND PELVIS WITH CONTRAST TECHNIQUE: Multidetector CT imaging of the abdomen and pelvis was performed using the standard protocol following bolus administration of intravenous contrast. RADIATION DOSE REDUCTION: This exam was performed according to the departmental dose-optimization program which includes automated exposure control, adjustment of the mA and/or kV according to patient size and/or use of iterative reconstruction technique. CONTRAST:  37mL OMNIPAQUE IOHEXOL 350 MG/ML SOLN COMPARISON:  CT examination dated May 25, 2022 FINDINGS: Lower chest: No acute abnormality. Hepatobiliary: No focal liver abnormality is seen. Multiple large rim calcified gallstones. No gallbladder wall thickening, or biliary dilatation. Pancreas: Unremarkable. No pancreatic ductal dilatation or surrounding inflammatory changes. Spleen: Normal in size without focal abnormality. Adrenals/Urinary Tract: Adrenal glands are unremarkable. 1.3  cm exophytic simple right renal cyst in the upper pole of the right kidney. Complex cyst in the interpolar region of the right kidney measuring up to 1.4 cm. 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 No evidence of hydronephrosis or ureteral calculus. Bladder is unremarkable. Stomach/Bowel: Stomach is within normal limits. Appendix not clearly identified. No evidence of bowel wall thickening, distention, or inflammatory changes. Retained stool in the rectum with rectal wall thickening suggesting proctitis/fecal impaction. Vascular/Lymphatic: Mild aortic atherosclerosis. No enlarged abdominal or pelvic lymph nodes. Reproductive: Uterus and bilateral adnexa are unremarkable. Other: No abdominal wall hernia or abnormality. No abdominopelvic ascites. Musculoskeletal: Multilevel degenerate disc disease of the lumbar  spine. Grade 2 anterolisthesis at L5-S1. Multilevel facet joint arthropathy. IMPRESSION: 1. Retained stool in the rectum with rectal wall thickening suggesting proctitis/fecal impaction. 2. Cholelithiasis without evidence of acute cholecystitis. 3. Bilateral renal cysts. No evidence of hydronephrosis or ureteral calculus. 4. 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 5. Multilevel degenerate disc disease of the lumbar spine with grade 2 anterolisthesis at L5-S1. 6. Aortic atherosclerosis. Electronically Signed   By: Keane Police D.O.   On: 06/14/2022 15:18    Procedures Procedures  {Document cardiac monitor, telemetry assessment procedure when appropriate:1}  Medications Ordered in ED Medications  sorbitol, milk of mag, mineral oil, glycerin (SMOG) enema (has no administration in time range)  iohexol (OMNIPAQUE) 350 MG/ML injection 80 mL (80 mLs Intravenous Contrast Given 06/14/22 1459)    ED Course/ Medical Decision Making/ A&P                            Medical Decision Making Amount and/or Complexity of Data Reviewed Labs: ordered. Radiology: ordered.  Risk Prescription drug management.   This patient presents to the ED for concern of abd pain, this involves an extensive number of treatment options, and is a complaint that carries with it a high risk of complications and morbidity.  The differential diagnosis includes uti, constipation, diverticulitis   Co morbidities that complicate the patient evaluation  HTN, CHF, p. Afib (on coumadin), MS, CHF, and morbid obesity   Additional history obtained:  Additional history obtained from epic chart review External records from outside source obtained and reviewed including EMS report   Lab Tests:  I Ordered, and personally interpreted labs.  The pertinent results include:  cbc with chronic anemia (hgb 9.1), cmp nl, lip nl   Imaging Studies ordered:  I ordered imaging studies including ct abd/pelvis  I independently visualized and interpreted imaging which showed  IMPRESSION: 1. Retained stool in the rectum with rectal wall thickening suggesting proctitis/fecal impaction. 2. Cholelithiasis without evidence of acute cholecystitis. 3. Bilateral renal cysts. No evidence of hydronephrosis or ureteral calculus. 4. 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 5. Multilevel degenerate disc disease of the lumbar spine with grade 2 anterolisthesis at L5-S1. 6. Aortic atherosclerosis.  I agree with the radiologist interpretation   Cardiac Monitoring:  The patient was maintained on a cardiac monitor.  I personally viewed and interpreted the cardiac monitored which showed an underlying rhythm of: nsr   Medicines ordered and prescription drug management:  I ordered medication including ***  for ***  Reevaluation of the patient after these medicines showed that the patient  {resolved/improved/worsened:23923::"improved"} I have reviewed the patients home medicines and have made adjustments as needed   Test Considered:  ***   Critical Interventions:  ***   Consultations Obtained:  I requested consultation with the ***,  and discussed lab and imaging findings as well as pertinent plan - they recommend: ***   Problem List / ED Course:  ***   Reevaluation:  After the interventions noted above, I reevaluated the patient and found that they have :{resolved/improved/worsened:23923::"improved"}   Social Determinants of Health:  ***   Dispostion:  After consideration of the diagnostic results and the patients response to treatment, I feel that the patent would benefit from ***.    {Document critical care time when appropriate:1} {Document review of  labs and clinical decision tools ie heart score, Chads2Vasc2 etc:1}  {Document your independent review of radiology images, and any outside records:1} {Document your discussion with family members, caretakers, and with consultants:1} {Document social determinants of health affecting pt's care:1} {Document your decision making why or why not admission, treatments were needed:1} Final Clinical Impression(s) / ED Diagnoses Final diagnoses:  None    Rx / DC Orders ED Discharge Orders     None

## 2022-06-14 NOTE — ED Triage Notes (Signed)
Pt BIB [facility] from [facility] due to lower abdominal pain.  Pt reports she has been having issues with UTI since October and has been taking antibiotics.  Pt reports it eases pain but it never fully goes away.  VS 132/90, SpO2 98 RA

## 2022-06-14 NOTE — ED Notes (Signed)
CBC recollected per lab request 

## 2022-06-15 ENCOUNTER — Ambulatory Visit: Payer: 59

## 2022-06-15 NOTE — ED Notes (Signed)
[facility] here to get pt  

## 2022-06-15 NOTE — ED Notes (Signed)
Pt to be transferred back to facility via [facility]

## 2022-06-21 ENCOUNTER — Ambulatory Visit: Payer: 59 | Admitting: Cardiology

## 2022-06-21 DIAGNOSIS — I480 Paroxysmal atrial fibrillation: Secondary | ICD-10-CM

## 2022-06-21 LAB — POCT INR: POC INR: 2.7

## 2022-06-21 MED ORDER — WARFARIN SODIUM 2.5 MG PO TABS: 2.5000 mg | ORAL_TABLET | ORAL | 3 refills | Status: DC

## 2022-06-21 MED ORDER — WARFARIN SODIUM 5 MG PO TABS: 5.0000 mg | ORAL_TABLET | ORAL | 3 refills | Status: DC

## 2022-06-21 NOTE — Progress Notes (Signed)
Description   06/21/2022  06/21/2022   INR today was 2.7. Patient goal level is (2.0-3.0)  Prior dose was 5 mg on Tuesdays and Thursdays and 2.5 mg all the other days.  patient will now start on 5 mg Monday, Wednesday, Friday and 2.5 mg all the other days.   Pt will recheck INR 4 weeks on 07/19/2022.  Lab Results      Component                Value               Date                      INR                      2.7                 06/21/2022                INR                      2.9 (H)             06/14/2022                INR                      3.1 (H)             05/25/2022               Meds ordered this encounter  Medications   warfarin (COUMADIN) 2.5 MG tablet    Sig: Take 1 tablet (2.5 mg total) by mouth as directed for 360 doses. Daily per protime    Dispense:  90 tablet    Refill:  3   warfarin (COUMADIN) 5 MG tablet    Sig: Take 1 tablet (5 mg total) by mouth as directed. Daily as per protime    Dispense:  90 tablet    Refill:  3

## 2022-07-04 ENCOUNTER — Other Ambulatory Visit: Payer: Self-pay | Admitting: Adult Health

## 2022-07-04 DIAGNOSIS — Z1231 Encounter for screening mammogram for malignant neoplasm of breast: Secondary | ICD-10-CM

## 2022-07-19 ENCOUNTER — Ambulatory Visit: Payer: 59 | Admitting: Cardiology

## 2022-07-19 DIAGNOSIS — I480 Paroxysmal atrial fibrillation: Secondary | ICD-10-CM

## 2022-07-19 LAB — POCT INR: POC INR: 2.6

## 2022-07-19 NOTE — Progress Notes (Signed)
Description   07/19/2022    INR today was 2.6. Patient goal level is (2.0-3.0)  Prior dose was 5 mg Monday, Wednesday, friday and 2.5 mg all the other days.  patient will continue with this dosage plan.   Pt will recheck INR 4 weeks on 08/16/2022.  Lab Results      Component                Value               Date                      INR                      2.6                 07/19/2022                INR                      2.7                 06/21/2022                INR                      2.9 (H)             06/14/2022            Paroxysmal atrial fibrillation (HCC)  (primary encounter diagnosis) Plan: POCT INR

## 2022-08-16 ENCOUNTER — Ambulatory Visit: Payer: 59 | Admitting: Internal Medicine

## 2022-08-16 DIAGNOSIS — I480 Paroxysmal atrial fibrillation: Secondary | ICD-10-CM

## 2022-08-16 LAB — POCT INR: INR: 3.1 — AB (ref 2.0–3.0)

## 2022-08-16 NOTE — Progress Notes (Signed)
08/16/2022    INR today was 3.1. Patient goal level is (2.0-3.0)  Prior dose was  5 mg Monday, Wednesday, friday and 2.5 mg all the other days.  Patient will continue  5 mg Monday, Wednesday, friday and 2.5 mg all the other days.   Pt will recheck INR 6 weeks on 02/21.   Lab Results      Component                Value               Date                      INR                      2.6                 07/19/2022                INR                      2.7                 06/21/2022                INR                      2.9 (H)             06/14/2022                Paroxysmal atrial fibrillation (HCC)  (primary encounter diagnosis) Plan: POCT INR

## 2022-08-21 ENCOUNTER — Telehealth: Payer: Self-pay | Admitting: Cardiology

## 2022-08-21 DIAGNOSIS — I480 Paroxysmal atrial fibrillation: Secondary | ICD-10-CM

## 2022-08-21 DIAGNOSIS — Z7901 Long term (current) use of anticoagulants: Secondary | ICD-10-CM

## 2022-08-21 NOTE — Telephone Encounter (Signed)
Patient's home INR evaluation performed 08/01/2022, patient has been therapeutic all along dating 05/15/2022 through 11/30/2021.    ICD-10-CM   1. Paroxysmal atrial fibrillation (HCC)  I48.0     2. Monitoring for long-term anticoagulant use  Z51.81    Z79.01

## 2022-08-29 ENCOUNTER — Ambulatory Visit
Admission: RE | Admit: 2022-08-29 | Discharge: 2022-08-29 | Disposition: A | Discharge status: Home / Self Care | Payer: 59 | Source: Ambulatory Visit | Attending: Adult Health | Admitting: Adult Health

## 2022-08-29 DIAGNOSIS — Z1231 Encounter for screening mammogram for malignant neoplasm of breast: Secondary | ICD-10-CM

## 2022-09-04 ENCOUNTER — Encounter: Payer: Self-pay | Admitting: Obstetrics and Gynecology

## 2022-09-04 ENCOUNTER — Ambulatory Visit (INDEPENDENT_AMBULATORY_CARE_PROVIDER_SITE_OTHER): Payer: 59 | Admitting: Obstetrics and Gynecology

## 2022-09-04 DIAGNOSIS — N950 Postmenopausal bleeding: Secondary | ICD-10-CM | POA: Diagnosis not present

## 2022-09-04 NOTE — Progress Notes (Signed)
NEW GYNECOLOGY PATIENT Patient name: Zoe Cobb MRN [id number]  Date of birth: 1956/01/02 Chief Complaint:   Follow-up     History:  Zoe Cobb is a 67 y.o. being seen today for vaginal bleeding for over a year. Last time saw bleeding was likely Saturday. Intermittent, no pain. Caretaker has had to put chucks pad underneath due to the bleeding.  Started on blood thinner in 2017 but does not note significant change with menses at that time. Not sure if anything provokes the bleeding, happens randomly. Stopped getting pap smears due to "nothing going on down there". Denies hx of abnormal pap, fibroids, or ovarian cysts and no FH of the like.   No pap smear in the last 40 years. 35-40 years since LMP. . Former smoker - 30 years quit. Blood is either red or pink.   Of note, in powered wheelchair and not able to stand on her own. Legs are very weak. Has a caretaker and uses lift at home for transfers.    Gynecologic History No LMP recorded. Patient is postmenopausal. Contraception: abstinence Last Pap: unknown Last Mammogram: 2019.  Birads 1 Last Colonoscopy: 12/2020  Obstetric History OB History  No obstetric history on file.    Past Medical History:  Diagnosis Date   CHF (congestive heart failure) (HCC)    Heart failure (HCC)    Hypertension    Morbid obesity (HCC)    Multiple sclerosis (HCC)    Paroxysmal atrial fibrillation ([place])     Past Surgical History:  Procedure Laterality Date   COLONOSCOPY WITH PROPOFOL N/A 12/15/2020   Procedure: COLONOSCOPY WITH PROPOFOL;  Surgeon: Ladene Artist, MD;  Location: [facility] ENDOSCOPY;  Service: Endoscopy;  Laterality: N/A;    Current Outpatient Medications on File Prior to Visit  Medication Sig Dispense Refill   acetaminophen (TYLENOL) 500 MG tablet Take 500 mg by mouth every 6 (six) hours as needed for moderate pain.     isosorbide mononitrate (IMDUR) 60 MG 24 hr tablet Take 60 mg by mouth daily.     metoprolol succinate  (TOPROL-XL) 50 MG 24 hr tablet Take 0.5 tablets (25 mg total) by mouth daily. (Patient taking differently: Take 75 mg by mouth daily.)     Multiple Vitamins-Minerals (ONE A DAY WOMEN 50 PLUS PO) Take 1 tablet by mouth daily.     torsemide (DEMADEX) 20 MG tablet Take 20 mg by mouth 2 (two) times daily.     traMADol (ULTRAM) 50 MG tablet Take 50 mg by mouth daily as needed for moderate pain.     warfarin (COUMADIN) 2.5 MG tablet Take 1 tablet (2.5 mg total) by mouth as directed for 360 doses. Daily per protime 90 tablet 3   warfarin (COUMADIN) 5 MG tablet Take 1 tablet (5 mg total) by mouth as directed. Daily as per protime 90 tablet 3   No current facility-administered medications on file prior to visit.    Allergies  Allergen Reactions   Penicillins Swelling    Has patient had a PCN reaction causing immediate rash, facial/tongue/throat swelling, SOB or lightheadedness with hypotension: Yes Has patient had a PCN reaction causing severe rash involving mucus membranes or skin necrosis: No Has patient had a PCN reaction that required hospitalization No Has patient had a PCN reaction occurring within the last 10 years: No If all of the above answers are "NO", then may proceed with Cephalosporin use.     Social History:  reports that she quit smoking about 28 years ago.  Her smoking use included cigarettes. She has a 9.00 pack-year smoking history. She has never used smokeless tobacco. She reports that she does not drink alcohol and does not use drugs.  Family History  Problem Relation Age of Onset   Hypertension Mother    Diabetes Mother    Stomach cancer Father    Kidney disease Sister    Diabetes Sister    Diabetes Sister    Heart attack Brother    HIV/AIDS Brother    Diabetes Brother     The following portions of the patient's history were reviewed and updated as appropriate: allergies, current medications, past family history, past medical history, past social history, past surgical  history and problem list.  Review of Systems Pertinent items noted in HPI and remainder of comprehensive ROS otherwise negative.  Physical Exam:  There were no vitals taken for this visit. Physical Exam Vitals and nursing note reviewed.  Constitutional:      General: She is not in acute distress. Pulmonary:     Effort: Pulmonary effort is normal.  Neurological:     General: No focal deficit present.     Mental Status: She is alert and oriented to person, place, and time.  Psychiatric:        Mood and Affect: Mood normal.        Behavior: Behavior normal.        Thought Content: Thought content normal.        Judgment: Judgment normal.      Assessment and Plan:   1. PMB (postmenopausal bleeding) Prolonged PMB without known etiology. Not likely to be successful in clinic - will plan for EUA and D&C. Risk of endometrial hyperplasia. Willing to try to get pelvic US to assess EMS.   Will need to discuss suppression - will offer lng-IUD at time of procedure. Possible preop cytotec (buccal) for cervical ripening.    Routine preventative health maintenance measures emphasized. Please refer to After Visit Summary for other counseling recommendations.   Follow-up: No follow-ups on file.      Darliss Cheney, MD Obstetrician & Gynecologist, Faculty Practice [facility] for [employer], [place]

## 2022-09-09 ENCOUNTER — Encounter (HOSPITAL_COMMUNITY): Payer: Self-pay | Admitting: Internal Medicine

## 2022-09-09 ENCOUNTER — Observation Stay (HOSPITAL_COMMUNITY)
Admission: EM | Admit: 2022-09-09 | Discharge: 2022-09-11 | Disposition: A | Discharge status: Home / Self Care | Payer: 59 | Attending: Internal Medicine | Admitting: Internal Medicine

## 2022-09-09 ENCOUNTER — Other Ambulatory Visit: Payer: Self-pay

## 2022-09-09 DIAGNOSIS — I110 Hypertensive heart disease with heart failure: Secondary | ICD-10-CM | POA: Insufficient documentation

## 2022-09-09 DIAGNOSIS — K922 Gastrointestinal hemorrhage, unspecified: Principal | ICD-10-CM | POA: Diagnosis present

## 2022-09-09 DIAGNOSIS — N939 Abnormal uterine and vaginal bleeding, unspecified: Secondary | ICD-10-CM | POA: Diagnosis not present

## 2022-09-09 DIAGNOSIS — K5731 Diverticulosis of large intestine without perforation or abscess with bleeding: Secondary | ICD-10-CM

## 2022-09-09 DIAGNOSIS — R791 Abnormal coagulation profile: Secondary | ICD-10-CM | POA: Diagnosis not present

## 2022-09-09 DIAGNOSIS — Z79899 Other long term (current) drug therapy: Secondary | ICD-10-CM | POA: Insufficient documentation

## 2022-09-09 DIAGNOSIS — I5032 Chronic diastolic (congestive) heart failure: Secondary | ICD-10-CM | POA: Insufficient documentation

## 2022-09-09 DIAGNOSIS — I480 Paroxysmal atrial fibrillation: Secondary | ICD-10-CM | POA: Diagnosis present

## 2022-09-09 DIAGNOSIS — Z6841 Body Mass Index (BMI) 40.0 and over, adult: Secondary | ICD-10-CM | POA: Diagnosis not present

## 2022-09-09 DIAGNOSIS — Z7901 Long term (current) use of anticoagulants: Secondary | ICD-10-CM

## 2022-09-09 LAB — HIV ANTIBODY (ROUTINE TESTING W REFLEX): HIV Screen 4th Generation wRfx: NONREACTIVE

## 2022-09-09 LAB — HEMOGLOBIN AND HEMATOCRIT, BLOOD
HCT: 29.6 % — ABNORMAL LOW (ref 36.0–46.0)
HCT: 29.5 % — ABNORMAL LOW (ref 36.0–46.0)
Hemoglobin: 8.4 g/dL — ABNORMAL LOW (ref 12.0–15.0)
Hemoglobin: 8.7 g/dL — ABNORMAL LOW (ref 12.0–15.0)

## 2022-09-09 LAB — BASIC METABOLIC PANEL
Anion gap: 10 (ref 5–15)
BUN: 11 mg/dL (ref 8–23)
CO2: 27 mmol/L (ref 22–32)
Calcium: 8.2 mg/dL — ABNORMAL LOW (ref 8.9–10.3)
Chloride: 99 mmol/L (ref 98–111)
Creatinine, Ser: 0.7 mg/dL (ref 0.44–1.00)
GFR, Estimated: >60 mL/min (ref >60)
Glucose, Bld: 101 mg/dL — ABNORMAL HIGH (ref 70–99)
Potassium: 3.9 mmol/L (ref 3.5–5.1)
Sodium: 136 mmol/L (ref 135–145)

## 2022-09-09 LAB — CBC
HCT: 30.1 % — ABNORMAL LOW (ref 36.0–46.0)
Hemoglobin: 8.8 g/dL — ABNORMAL LOW (ref 12.0–15.0)
MCH: 21.3 pg — ABNORMAL LOW (ref 26.0–34.0)
MCHC: 29.2 g/dL — ABNORMAL LOW (ref 30.0–36.0)
MCV: 72.7 fL — ABNORMAL LOW (ref 80.0–100.0)
Platelets: 441 10*3/uL — ABNORMAL HIGH (ref 150–400)
RBC: 4.14 MIL/uL (ref 3.87–5.11)
RDW: 18.4 % — ABNORMAL HIGH (ref 11.5–15.5)
WBC: 6.5 10*3/uL (ref 4.0–10.5)
nRBC: 0 % (ref 0.0–0.2)

## 2022-09-09 LAB — RETICULOCYTES
Immature Retic Fract: 23.6 % — ABNORMAL HIGH (ref 2.3–15.9)
RBC.: 3.96 MIL/uL (ref 3.87–5.11)
Retic Count, Absolute: 42.8 10*3/uL (ref 19.0–186.0)
Retic Ct Pct: 1.1 % (ref 0.4–3.1)

## 2022-09-09 LAB — IRON AND TIBC
Iron: 25 ug/dL — ABNORMAL LOW (ref 28–170)
Saturation Ratios: 7 % — ABNORMAL LOW (ref 10.4–31.8)
TIBC: 354 ug/dL (ref 250–450)
UIBC: 329 ug/dL

## 2022-09-09 LAB — PROTIME-INR
INR: 3.5 — ABNORMAL HIGH (ref 0.8–1.2)
Prothrombin Time: 35 seconds — ABNORMAL HIGH (ref 11.4–15.2)

## 2022-09-09 LAB — FERRITIN: Ferritin: 14 ng/mL (ref 11–307)

## 2022-09-09 NOTE — ED Provider Notes (Signed)
[facility] Provider Note   CSN: [id number] Arrival date & time: 09/09/22  1148     History  Chief Complaint  Patient presents with   Vaginal/Rectal Bleeding    About every 3 months for the past 3 years    Zoe Cobb is a 67 y.o. female with history of hypertension, CHF, paroxysmal A-fib on Coumadin, multiple sclerosis, morbid obesity who presents the emergency department complaining of rectal and vaginal bleeding.  Patient states that this normally happens for a few days every few months, and has been going on for the past 3 years or so.  She follows with her GYN about the vaginal bleeding, last saw them on 1/29.  States today she was having some discomfort in her generalized perineum and when the CNA at her living facility helped her, they noticed some bleeding.  Patient then went to have a bowel movement said that she saw clots in it.  She thinks the vaginal bleeding is still going on, but is mainly complaining about the rectal bleeding at this time.  Otherwise feels okay.  Has needed blood transfusions in the past. Reports when she's had rectal bleeding in the past they've told her it was due to diverticulitis, but reports she never has pain with this.   HPI     Home Medications Prior to Admission medications   Medication Sig Start Date End Date Taking? Authorizing Provider  acetaminophen (TYLENOL) 500 MG tablet Take 500 mg by mouth every 6 (six) hours as needed for moderate pain.    [provider]  isosorbide mononitrate (IMDUR) 60 MG 24 hr tablet Take 60 mg by mouth daily. 05/04/22   [provider]  metoprolol succinate (TOPROL-XL) 50 MG 24 hr tablet Take 0.5 tablets (25 mg total) by mouth daily. Patient taking differently: Take 75 mg by mouth daily. 08/29/21   Debbe Odea, MD  Multiple Vitamins-Minerals (ONE A DAY WOMEN 50 PLUS PO) Take 1 tablet by mouth daily.    [provider]  torsemide (DEMADEX)  20 MG tablet Take 20 mg by mouth 2 (two) times daily.    [provider]  traMADol (ULTRAM) 50 MG tablet Take 50 mg by mouth daily as needed for moderate pain. 01/17/21   [provider]  warfarin (COUMADIN) 2.5 MG tablet Take 1 tablet (2.5 mg total) by mouth as directed for 360 doses. Daily per protime 06/21/22   Adrian Prows, MD  warfarin (COUMADIN) 5 MG tablet Take 1 tablet (5 mg total) by mouth as directed. Daily as per protime 06/21/22   Adrian Prows, MD      Allergies    Penicillins    Review of Systems   Review of Systems  Gastrointestinal:  Positive for blood in stool.  Genitourinary:  Positive for vaginal bleeding.  All other systems reviewed and are negative.   Physical Exam Updated Vital Signs BP 108/71 (BP Location: Right Arm)   Pulse (!) 57   Temp 97.9 F (36.6 C) (Oral)   Resp 20   Ht 5\' 1"  (1.549 m)   Wt (!) 204.1 kg   SpO2 99%   BMI 85.03 kg/m  Physical Exam Vitals and nursing note reviewed. Exam conducted with a chaperone present.  Constitutional:      Appearance: Normal appearance. She is obese.  HENT:     Head: Normocephalic and atraumatic.  Eyes:     Conjunctiva/sclera: Conjunctivae normal.  Pulmonary:     Effort: Pulmonary effort  is normal. No respiratory distress.  Genitourinary:    Exam position: Supine.     Comments: Nursing assisted with maneuvering patient. Visualized bright red blood to vaginal canal and rectum. Exam limited due to body habitus.  Skin:    General: Skin is warm and dry.  Neurological:     Mental Status: She is alert.  Psychiatric:        Mood and Affect: Mood normal.        Behavior: Behavior normal.     ED Results / Procedures / Treatments   Labs (all labs ordered are listed, but only abnormal results are displayed) Labs Reviewed  CBC - Abnormal; Notable for the following components:      Result Value   Hemoglobin 8.8 (*)    HCT 30.1 (*)    MCV 72.7 (*)    MCH 21.3 (*)    MCHC 29.2 (*)    RDW 18.4  (*)    Platelets 441 (*)    All other components within normal limits  BASIC METABOLIC PANEL - Abnormal; Notable for the following components:   Glucose, Bld 101 (*)    Calcium 8.2 (*)    All other components within normal limits  PROTIME-INR    EKG None  Radiology No results found.  Procedures Procedures    Medications Ordered in ED Medications - No data to display  ED Course/ Medical Decision Making/ A&P                             Medical Decision Making Amount and/or Complexity of Data Reviewed Labs: ordered.  This patient is a 67 y.o. female  who presents to the ED for concern of vaginal and rectal bleeding. On Coumadin for paroxysmal atrial fibrillation.   Differential diagnoses prior to evaluation: The emergent differential diagnosis includes, but is not limited to,  abnormal uterine bleeding, diverticulitis, anal fissure, hemorrhoids, colitis. This is not an exhaustive differential.   Past Medical History / Co-morbidities: hypertension, CHF, paroxysmal A-fib on Coumadin, multiple sclerosis, morbid obesity   Additional history: Chart reviewed. Pertinent results include: Numerous previous visits for similar symptoms.  Has been hospitalized in the past for similar episodes, in January 2023 patient was found to have acute GI bleeding. Has previously needed multiple doses of megace, reversal of coumadin, and blood transfusions.   Patient says she has an appointment for a formal pelvic exam in the OR with OBGYN coming up soon. Per chart review, etiology for bleeding unknown.   Physical Exam: Physical exam performed. The pertinent findings include: Normal vital signs. GU exam limited due to body habitus, but visualized bright red blood per vagina and per rectum. Abdomen without tenderness.   Lab Tests/Imaging studies: I personally interpreted labs/imaging and the pertinent results include: Hemoglobin 8.8, stable compared to prior.  BMP unremarkable.  INR  pending.  Consultations obtained: I consulted with [facility] gastroenterology PA Amy Mendenhall who will review the patient's chart and follow once admitted.  Since there is no evidence of a significant hemorrhage at this time, she does not think the patient's requiring CT imaging.  Does agree with medical admission for observation and formal GI consult.  Oncoming team to take call with hospitalist for admission.    Disposition: After consideration of the diagnostic results and the patients response to treatment, I feel that patient would benefit from medical admission for further evaluation of acute GI bleed. No significant evidence of emergent life  threatening hemorrhage requiring transfusion.   Final Clinical Impression(s) / ED Diagnoses Final diagnoses:  Acute GI bleeding  Abnormal uterine bleeding    Rx / DC Orders ED Discharge Orders     None      Portions of this report may have been transcribed using voice recognition software. Every effort was made to ensure accuracy; however, inadvertent computerized transcription errors may be present.    Estill Cotta 09/09/22 1546    Blanchie Dessert, MD 09/10/22 1116

## 2022-09-09 NOTE — Hospital Course (Addendum)
Is having bleeding from vagina and rectum since yesterday. Has had this before, said previously told it's from coumadin. She didn't take that today. She takes INR at home, Mondays, was 4.1. no pain. Has been having this bleeding for years she says  Was dx with diverticulitis and had bleeding Saw gyn last week about vaginal bleeding, working on having d&c scheduled for AUB  No recent illnesses, fever, chills, shob.   She saw blood today when her aide came to help her [place] aide told her there were blood clots around rectum too. Dark red but usually is just red.   Last menstrual cycle 25+y ago  Has required blood transfusions before Last colonoscopy was 2-3y ago No pap since son was born he is 35 yo No hysterectomy  Was on antibiotic for an abscess in her mouth? 1 week ago Friday, has 3 more days  Before last Monday inr was 2.7, started antibiotics 3 days before inr of 4  Pmh Htn A fib Hf MS  Meds  Imdur Metoprolol MV Torsemide Tramadol tylenol  Psh Colonoscopy 2022  Allergic to penicillin  Sochx Lives with wife, has an aide who comes in morning and evening. They don't stay all day. They help with bathing, dressing, meals, in/out if bed. Takes meds herself. Is wheelchair bound from [place] 1995-1996. Pcp julie barr, sees her once a month. Hasn't smoked for 30y, smoked as a teen, 1 pack every 2 weeks. Drinks a couple times per year. No other drugs.  Family hx See chart  02/05: slept well, not dizzy. Says bleeding is slowing down, didn't have any over night. No problem eating or drinking, no n/v *in instructions, put reminder for her to tell OB/Gyn about eliquis

## 2022-09-09 NOTE — Progress Notes (Signed)
Patient transferred from the ED at 1835pm

## 2022-09-09 NOTE — ED Provider Notes (Signed)
Accepted handoff at shift change from Roem[place]ildt, Lorin PA-C. Please see prior provider note for more detail.   Briefly: Patient is 67 y.o. with known vaginal or rectal bleeding on Coumadin for A-fib.  Presented for rectal and vaginal bleeding.  Exam revealed she was positive for bleeding rectally vaginally.  DDX: concern for diverticulitis, abnormal uterine bleed, anal fissure hemorrhoids and colitis  Plan: Admit to hospital with GI consult    Physical Exam  BP 108/71 (BP Location: Right Arm)   Pulse (!) 57   Temp 97.9 F (36.6 C) (Oral)   Resp 20   Ht 5\' 1"  (1.549 m)   Wt (!) 204.1 kg   SpO2 99%   BMI 85.03 kg/m   Physical Exam  Procedures  Procedures  ED Course / MDM    Medical Decision Making Amount and/or Complexity of Data Reviewed Labs: ordered.  Risk Decision regarding hospitalization.   Admitted to hospital service with plans for GI to evaluate in consult       Harriet Pho, PA-C 09/09/22 1617    Lajean Saver, MD 09/11/22 [phone]

## 2022-09-09 NOTE — ED Notes (Signed)
ED TO INPATIENT HANDOFF REPORT  ED Nurse Name and Phone #: Elnita Maxwell Name/Age/Gender Zoe Cobb 67 y.o. female Room/Bed: 030C/030C  Code Status   Code Status: Full Code  Home/SNF/Other Home Patient oriented to: self, place, time, and situation Is this baseline? Yes   Triage Complete: Triage complete  Chief Complaint Acute GI bleeding [K92.2]  Triage Note Called for approximately 1-2 days of rectal/vaginal bleeding every 3 months for the past 3 years, denies pain or injury, but thought she should get it checked out.   Allergies Allergies  Allergen Reactions   Penicillins Swelling    Has patient had a PCN reaction causing immediate rash, facial/tongue/throat swelling, SOB or lightheadedness with hypotension: Yes Has patient had a PCN reaction causing severe rash involving mucus membranes or skin necrosis: No Has patient had a PCN reaction that required hospitalization No Has patient had a PCN reaction occurring within the last 10 years: No If all of the above answers are "NO", then may proceed with Cephalosporin use.     Level of Care/Admitting Diagnosis ED Disposition     ED Disposition  Admit   Condition  --   [place]: [facility] [[id number]]  Level of Care: Med-Surg [16]  May place patient in observation at [facility] or [facility] if equivalent level of care is available:: No  Covid Evaluation: Asymptomatic - no recent exposure (last 10 days) testing not required  Diagnosis: Acute GI bleeding [035009]  Admitting Physician: Velna Ochs [[id number]]  Attending Physician: Velna Ochs [[id number]]          B Medical/Surgery History Past Medical History:  Diagnosis Date   CHF (congestive heart failure) ([place])    Heart failure ([place])    Hypertension    Morbid obesity ([place])    Multiple sclerosis ([place])    Paroxysmal atrial fibrillation ([place])    Past Surgical History:  Procedure Laterality Date   COLONOSCOPY  WITH PROPOFOL N/A 12/15/2020   Procedure: COLONOSCOPY WITH PROPOFOL;  Surgeon: Ladene Artist, MD;  Location: [facility] ENDOSCOPY;  Service: Endoscopy;  Laterality: N/A;     A IV Location/Drains/Wounds Patient Lines/Drains/Airways Status     Active Line/Drains/Airways     Name Placement date Placement time Site Days   Peripheral IV 09/09/22 20 G 1.88" Left;Anterior Forearm 09/09/22  1419  Forearm  less than 1   Wound / Incision (Open or Dehisced) 11/01/21 (MASD) Moisture Associated Skin Damage Thigh Posterior;Right pink, intact, and healed 11/01/21  1600  Thigh  312            Intake/Output Last 24 hours No intake or output data in the 24 hours ending 09/09/22 1749  Labs/Imaging Results for orders placed or performed during the hospital encounter of 09/09/22 (from the past 48 hour(s))  CBC     Status: Abnormal   Collection Time: 09/09/22 11:56 AM  Result Value Ref Range   WBC 6.5 4.0 - 10.5 K/uL   RBC 4.14 3.87 - 5.11 MIL/uL   Hemoglobin 8.8 (L) 12.0 - 15.0 g/dL    Comment: Reticulocyte Hemoglobin testing may be clinically indicated, consider ordering this additional test [id number]    HCT 30.1 (L) 36.0 - 46.0 %   MCV 72.7 (L) 80.0 - 100.0 fL   MCH 21.3 (L) 26.0 - 34.0 pg   MCHC 29.2 (L) 30.0 - 36.0 g/dL   RDW 18.4 (H) 11.5 - 15.5 %   Platelets 441 (H) 150 - 400 K/uL  nRBC 0.0 0.0 - 0.2 %    Comment: Performed at [facility], [place] [address] [place], [place] [address]  Basic metabolic panel     Status: Abnormal   Collection Time: 09/09/22 11:56 AM  Result Value Ref Range   Sodium 136 135 - 145 mmol/L   Potassium 3.9 3.5 - 5.1 mmol/L   Chloride 99 98 - 111 mmol/L   CO2 27 22 - 32 mmol/L   Glucose, Bld 101 (H) 70 - 99 mg/dL    Comment: Glucose reference range applies only to samples taken after fasting for at least 8 hours.   BUN 11 8 - 23 mg/dL   Creatinine, Ser 0.70 0.44 - 1.00 mg/dL   Calcium 8.2 (L) 8.9 - 10.3 mg/dL   GFR, Estimated >60 >60 mL/min     Comment: (NOTE) Calculated using the CKD-EPI Creatinine Equation (2021)    Anion gap 10 5 - 15    Comment: Performed at [facility] [address], [place], [place] [address]  Hemoglobin and hematocrit, blood     Status: Abnormal   Collection Time: 09/09/22  4:03 PM  Result Value Ref Range   Hemoglobin 8.4 (L) 12.0 - 15.0 g/dL   HCT 29.6 (L) 36.0 - 46.0 %    Comment: Performed at [facility], [place] [address], [place], [place] [address]   No results found.  Pending Labs Unresulted Labs (From admission, onward)     Start     Ordered   09/10/22 2297  Basic metabolic panel  Tomorrow morning,   R       Question:  Specimen collection method  Answer:  IV Team=IV Team collect   09/09/22 1743   09/09/22 1742  HIV Antibody (routine testing w rflx)  (HIV Antibody (Routine testing w reflex) panel)  Once,   R       Question:  Specimen collection method  Answer:  IV Team=IV Team collect   09/09/22 1743   09/09/22 1623  Iron and TIBC  (Anemia Panel (PNL))  Once,   URGENT       Question:  Specimen collection method  Answer:  IV Team=IV Team collect   09/09/22 1622   09/09/22 1623  Ferritin  (Anemia Panel (PNL))  Once,   URGENT       Question:  Specimen collection method  Answer:  IV Team=IV Team collect   09/09/22 1622   09/09/22 1623  Reticulocytes  (Anemia Panel (PNL))  Once,   URGENT       Question:  Specimen collection method  Answer:  IV Team=IV Team collect   09/09/22 1622   09/09/22 1605  Protime-INR  Daily,   R     Question:  Specimen collection method  Answer:  IV Team=IV Team collect   09/09/22 1604   09/09/22 1603  Hemoglobin and hematocrit, blood  Now then every 6 hours,   R (with STAT occurrences)     Comments: Does not need to be now hemoglobin, just start 6 hours after the last hemoglobin and call MD for hemoglobin less than 8   Question:  Specimen collection method  Answer:  IV Team=IV Team collect   09/09/22 1604   09/09/22 1445  Protime-INR  Once,   STAT         09/09/22 1445            Vitals/Pain Today's Vitals   09/09/22 1156 09/09/22 1205  BP:  108/71  Pulse:  Marland Kitchen)  57  Resp:  20  Temp:  97.9 F (36.6 C)  TempSrc:  Oral  SpO2:  99%  Weight:  (!) 204.1 kg  Height:  5\' 1"  (1.549 m)  PainSc: 0-No pain     Isolation Precautions No active isolations  Medications Medications - No data to display  Mobility power wheelchair     Focused Assessments     R Recommendations: See Admitting Provider Note  Report given to:   Additional Notes: pt is AOX4, very pleasant, able to verbalize needs

## 2022-09-09 NOTE — Consult Note (Signed)
Consultation  Referring Provider:  [place]  Primary Care Physician:  Alvester Chou, NP Primary Gastroenterologist: Althia Forts, seen by [facility] during hospitalization 2022  Reason for Consultation: Rectal bleeding  HPI: Zoe Cobb is a 67 y.o. female, who presented to the emergency room today with onset of rectal bleeding last evening.  Patient has history of atrial fibrillation for which she is on chronic Coumadin, history of morbid obesity with BMI of 85, MS, congestive heart failure, hypertension. She says that she is currently on an antibiotic at home for an abscessed tooth which she has been on for about a week and a half, does not remember the name of that drug.  She had her INR checked this past Monday through her cardiologist and it was up to 4.1, no changes were made.  She says that she has been having intermittent vaginal bleeding over the past year which does not occur daily but can be heavy at times.  She was seen by GYN on 09/04/2022 She has not had any GYN care for many many years had not had a Pap smear in 35 to 40 years, and plan is for EUA and D&C in the upcoming weeks with the patient off Coumadin. Patient says she does not generally have any difficulty with constipation, no straining and has not had any recent change in her bowel habits.  No complaints of abdominal pain or cramping, no nausea or vomiting.  She says the blood was more pinkish appearing last night and then today with a bowel movement after her aide came to help her noted to have dark blood and clots mixed throughout a bowel movement. She did have an ER visit in November 2023 with abdominal pain and was noted to have contained stool in the rectum with rectal wall thickening suggesting proctitis/fecal impaction.  She did have some disimpaction done in the emergency room, also noted to have cholelithiasis and bilateral renal cysts as well as multilevel degenerative disc disease in the lumbar spine.  She  has not had any imaging today  She is hemodynamically stable  She relates that she has been anemic for a long time and has occasionally required blood transfusions. Reviewing labs hemoglobin from November 2023 was 9.1 hematocrit 31.3 and MCV 75.  In August 2023 hemoglobin 8.8/hematocrit 29.5  Today hemoglobin 8.8/hematocrit 30.1/MCV 72.7/platelets 441 INR is pending BUN 11/creatinine 0.70  She did have an admission in May 2022 with hematochezia and underwent colonoscopy per Dr. Fuller Plan at that time.  There was no active bleeding at the time of colonoscopy, no polyps but did have multiple small and large mouth diverticuli in the left colon.     Past Medical History:  Diagnosis Date   CHF (congestive heart failure) (HCC)    Heart failure (HCC)    Hypertension    Morbid obesity (HCC)    Multiple sclerosis (HCC)    Paroxysmal atrial fibrillation ([place])     Past Surgical History:  Procedure Laterality Date   COLONOSCOPY WITH PROPOFOL N/A 12/15/2020   Procedure: COLONOSCOPY WITH PROPOFOL;  Surgeon: Ladene Artist, MD;  Location: [facility] ENDOSCOPY;  Service: Endoscopy;  Laterality: N/A;    Prior to Admission medications   Medication Sig Start Date End Date Taking? Authorizing Provider  acetaminophen (TYLENOL) 500 MG tablet Take 500 mg by mouth every 6 (six) hours as needed for moderate pain.    [provider]  isosorbide mononitrate (IMDUR) 60 MG 24 hr tablet Take 60 mg by mouth  daily. 05/04/22   [provider]  metoprolol succinate (TOPROL-XL) 50 MG 24 hr tablet Take 0.5 tablets (25 mg total) by mouth daily. Patient taking differently: Take 75 mg by mouth daily. 08/29/21   Calvert Cantor, MD  Multiple Vitamins-Minerals (ONE A DAY WOMEN 50 PLUS PO) Take 1 tablet by mouth daily.    [provider]  torsemide (DEMADEX) 20 MG tablet Take 20 mg by mouth 2 (two) times daily.    [provider]  traMADol (ULTRAM) 50 MG tablet Take 50 mg by mouth daily as needed  for moderate pain. 01/17/21   [provider]  warfarin (COUMADIN) 2.5 MG tablet Take 1 tablet (2.5 mg total) by mouth as directed for 360 doses. Daily per protime 06/21/22   Yates Decamp, MD  warfarin (COUMADIN) 5 MG tablet Take 1 tablet (5 mg total) by mouth as directed. Daily as per protime 06/21/22   Yates Decamp, MD    No current facility-administered medications for this encounter.   Current Outpatient Medications  Medication Sig Dispense Refill   acetaminophen (TYLENOL) 500 MG tablet Take 500 mg by mouth every 6 (six) hours as needed for moderate pain.     isosorbide mononitrate (IMDUR) 60 MG 24 hr tablet Take 60 mg by mouth daily.     metoprolol succinate (TOPROL-XL) 50 MG 24 hr tablet Take 0.5 tablets (25 mg total) by mouth daily. (Patient taking differently: Take 75 mg by mouth daily.)     Multiple Vitamins-Minerals (ONE A DAY WOMEN 50 PLUS PO) Take 1 tablet by mouth daily.     torsemide (DEMADEX) 20 MG tablet Take 20 mg by mouth 2 (two) times daily.     traMADol (ULTRAM) 50 MG tablet Take 50 mg by mouth daily as needed for moderate pain.     warfarin (COUMADIN) 2.5 MG tablet Take 1 tablet (2.5 mg total) by mouth as directed for 360 doses. Daily per protime 90 tablet 3   warfarin (COUMADIN) 5 MG tablet Take 1 tablet (5 mg total) by mouth as directed. Daily as per protime 90 tablet 3    Allergies as of 09/09/2022 - Review Complete 09/09/2022  Allergen Reaction Noted   Penicillins Swelling 06/28/2015    Family History  Problem Relation Age of Onset   Hypertension Mother    Diabetes Mother    Stomach cancer Father    Kidney disease Sister    Diabetes Sister    Diabetes Sister    Heart attack Brother    HIV/AIDS Brother    Diabetes Brother     Social History   Socioeconomic History   Marital status: Married    Spouse name: Not on file   Number of children: 2   Years of education: Not on file   Highest education level: Not on file  Occupational History   Not on  file  Tobacco Use   Smoking status: Former    Packs/day: 1.00    Years: 9.00    Total pack years: 9.00    Types: Cigarettes    Quit date: 1996    Years since quitting: 28.1   Smokeless tobacco: Never  Vaping Use   Vaping Use: Never used  Substance and Sexual Activity   Alcohol use: No    Alcohol/week: 0.0 standard drinks of alcohol   Drug use: No   Sexual activity: Not on file  Other Topics Concern   Not on file  Social History Narrative   Pt lives by herself, she completed  hs.    Social Determinants of Health   Financial Resource Strain: Not on file  Food Insecurity: Not on file  Transportation Needs: Not on file  Physical Activity: Not on file  Stress: Not on file  Social Connections: Not on file  Intimate Partner Violence: Not on file    Review of Systems: Pertinent positive and negative review of systems were noted in the above HPI section.  All other review of systems was otherwise negative.   Physical Exam: Vital signs in last 24 hours: Temp:  [97.9 F (36.6 C)] 97.9 F (36.6 C) (02/03 1205) Pulse Rate:  [57] 57 (02/03 1205) Resp:  [20] 20 (02/03 1205) BP: (108)/(71) 108/71 (02/03 1205) SpO2:  [99 %] 99 % (02/03 1205) Weight:  [204.1 kg] 204.1 kg (02/03 1205)   General:   Alert,  Well-developed, morbidly obese African-American female very pleasant and cooperative in NAD Head:  Normocephalic and atraumatic. Eyes:  Sclera clear, no icterus.   Conjunctiva pink. Ears:  Normal auditory acuity. Nose:  No deformity, discharge,  or lesions. Mouth:  No deformity or lesions.   Neck:  Supple; no masses or thyromegaly. Lungs:  Clear throughout to auscultation.   No wheezes, crackles, or rhonchi. Heart:  Regular rate and rhythm; no murmurs, clicks, rubs,  or gallops. Abdomen:  Soft, morbidly obese, nontender, bowel sounds are present she has an incisional scar in the lateral right abdomen from prior abscess Rectal: Done per ER provider noting bright red blood from the  vaginal canal and the rectum, limited due to her body habitus Msk:  Symmetrical without gross deformities. . Pulses:  Normal pulses noted. Extremities:  Without clubbing or edema. Neurologic:  Alert and  oriented x4;  grossly normal neurologically. Skin:  Intact without significant lesions or rashes.. Psych:  Alert and cooperative. Normal mood and affect.  Intake/Output from previous day: No intake/output data recorded. Intake/Output this shift: No intake/output data recorded.  Lab Results: Recent Labs    09/09/22 1156  WBC 6.5  HGB 8.8*  HCT 30.1*  PLT 441*   BMET Recent Labs    09/09/22 1156  NA 136  K 3.9  CL 99  CO2 27  GLUCOSE 101*  BUN 11  CREATININE 0.70  CALCIUM 8.2*   LFT No results for input(s): "PROT", "ALBUMIN", "AST", "ALT", "ALKPHOS", "BILITOT", "BILIDIR", "IBILI" in the last 72 hours. PT/INR No results for input(s): "LABPROT", "INR" in the last 72 hours. Hepatitis Panel No results for input(s): "HEPBSAG", "HCVAB", "HEPAIGM", "HEPBIGM" in the last 72 hours.    IMPRESSION:  #53 67 year old African-American female with onset of lower GI bleeding last evening, this morning with dark red blood and clots with bowel movement in setting of chronic Coumadin Patient has prior history of admission in May 2022 with similar presentation and underwent colonoscopy at that time negative other than multiple diverticuli in the left colon, was felt to have had a diverticular bleed  -Has been on antibiotics over the past week and a half, wonder if INR has become supratherapeutic in setting of antibiotics, precipitating bleeding  #2 chronic intermittent vaginal bleeding over the past year-definitely contributing to chronic anemia and likely iron deficiency. She saw GYN last week and plan is for D&C and UA off Coumadin within the next few weeks #3 anemia acute on chronic microcytic suspect iron deficiency #4 history of atrial fibrillation #5 congestive heart failure-echo  2018 EF 55-60, technically difficult study mildly elevated pulmonary pressures #6 hypertension #7.  Multiple sclerosis #8  morbid obesity BMI *5  PLAN: Clear liquid diet Hold Coumadin Daily INR Hemoglobin every 6 hours and transfuse for hemoglobin less than 8 Will check iron studies and if iron deficient may benefit from IV iron while inpatient If she develops more active bleeding with drop in hemoglobin or hemodynamic instability should proceed to CT angio abdomen and pelvis, and if this is positive then to IR for angio and embolization If she remains stable, should be able to manage conservatively with support Do not anticipate need for repeat colonoscopy this admission unless she has ongoing prolonged bleeding or change in current status.  GI will follow with you     PA-C 09/09/2022, 4:05 PM

## 2022-09-09 NOTE — ED Triage Notes (Signed)
Called for approximately 1-2 days of rectal/vaginal bleeding every 3 months for the past 3 years, denies pain or injury, but thought she should get it checked out.

## 2022-09-09 NOTE — H&P (Cosign Needed Addendum)
Date: 09/10/2022               Patient Name:  Zoe Cobb MRN: [id number]  DOB: 06-06-1956 Age / Sex: 67 y.o., female   PCP: Alvester Chou, NP         Medical Service: Internal Medicine Teaching Service         Attending Physician: Dr. Velna Ochs, MD      First Contact: Dr. Drucie Opitz, MD Pager [phone]    Second Contact: Dr. Farrel Gordon, DO Pager [phone]         After Hours (After 5p/  First Contact Pager: [phone]  weekends / holidays): Second Contact Pager: [phone]   SUBJECTIVE   Chief Complaint: Rectal Bleeding  History of Present Illness:   Zoe Cobb is a 67 year old female with past medical history of atrial fibrillation on chronic coumadin, morbid obesity with BMI of 85, Multiple Sclerosis, CHF, and hypertension who presents with rectal and vaginal bleeding. Pt states she is having bleeding from her vagina and rectum since yesterday. Color is dark red, and not associated with any pain. This has been happening intermittently for the last two-three years. She has been taking coumadin for years, and checks her INR at home. Her most recent INR was 4.1. Of note, she was recently prescribed antibiotics for an abscess in her mouth, and three days after initiation of antibiotics her INR was elevated. Her INR the week before was 2.7, and therapeutic range is 2.0-3.0. Pt had undergone a colonoscopy on 12/2020, which revealed moderate diverticulosis in the left colon. Previous bleeding was thought to be due to diverticular bleeds and she has required multiple transfusions.   She had recently seen an OB/GYN doctor on 1/29 for evaluation of vaginal bleeding. She has not had a pap smear in the last 40 years, and her last menstrual period was 35-40 years ago.   She denies any recent chills, fever, shortness of breath, recent illness.    ED Course: Initial labs showed Hb at 8.8, no leukocytosis, and BMP within normal limits as well. INR at 3.5. No imaging obtained. GI  consulted, IMTS consulted for admission  Meds:  Imdur 60mg  Metoprolol 50mg  Multivitamins Torsemide 20mg  Tramadol PRN   Past Medical History  Hypertension Atrial Fibrillation HFpEF EF 55-60% Multiple Sclerosis  Past Surgical History:  Procedure Laterality Date   COLONOSCOPY WITH PROPOFOL N/A 12/15/2020   Procedure: COLONOSCOPY WITH PROPOFOL;  Surgeon: Ladene Artist, MD;  Location: [facility] ENDOSCOPY;  Service: Endoscopy;  Laterality: N/A;    Social:  Lives With: Wife Occupation: Un-employed Support: Has aids that come daily Level of Function: Fairly dependent on most ADLs/IADLs PCP: Alvester Chou, NP  Substances: Minimal cigarette use  Family History:   Family History  Problem Relation Age of Onset   Hypertension Mother    Diabetes Mother    Stomach cancer Father    Kidney disease Sister    Diabetes Sister    Diabetes Sister    Diabetes Sister    Heart attack Brother    HIV/AIDS Brother    Diabetes Brother       Allergies: Allergies as of 09/09/2022 - Review Complete 09/09/2022  Allergen Reaction Noted   Penicillins Swelling 06/28/2015    Review of Systems: A complete ROS was negative except as per HPI.   OBJECTIVE:   Physical Exam: Blood pressure 108/71, pulse (!) 57, temperature 97.9 F (36.6 C), temperature source Oral, resp. rate 20, height 5\' 1"  (1.549 m), weight Marland Kitchen)  204.1 kg, SpO2 99 %.  Constitutional: Morbidly Obese female, pleasant, in no acute distress HENT: normocephalic atraumatic, mucous membranes moist Eyes: conjunctiva non-erythematous Neck: supple Cardiovascular: regular rate and rhythm, no m/r/g Pulmonary/Chest: normal work of breathing on room air, lungs clear to auscultation bilaterally Abdominal: soft, non-tender, non-distended MSK: normal bulk and tone Neurological: alert & oriented x 3, 5/5 strength in bilateral upper and lower extremities, normal gait Skin: warm and dry Genitourinary: Unable to visualize given body habitus Psych:  normal mood and affect  Labs: CBC    Component Value Date/Time   WBC 6.5 09/09/2022 1156   RBC 3.96 09/09/2022 1700   RBC 4.14 09/09/2022 1156   HGB 8.7 (L) 09/09/2022 2204   HCT 29.5 (L) 09/09/2022 2204   PLT 441 (H) 09/09/2022 1156   MCV 72.7 (L) 09/09/2022 1156   MCH 21.3 (L) 09/09/2022 1156   MCHC 29.2 (L) 09/09/2022 1156   RDW 18.4 (H) 09/09/2022 1156   LYMPHSABS 1.6 06/14/2022 1406   MONOABS 0.7 06/14/2022 1406   EOSABS 0.2 06/14/2022 1406   BASOSABS 0.0 06/14/2022 1406     CMP     Component Value Date/Time   NA 136 09/09/2022 1156   K 3.9 09/09/2022 1156   CL 99 09/09/2022 1156   CO2 27 09/09/2022 1156   GLUCOSE 101 (H) 09/09/2022 1156   BUN 11 09/09/2022 1156   CREATININE 0.70 09/09/2022 1156   CALCIUM 8.2 (L) 09/09/2022 1156   PROT 8.1 06/14/2022 1126   ALBUMIN 2.5 (L) 06/14/2022 1126   AST 27 06/14/2022 1126   ALT 11 06/14/2022 1126   ALKPHOS 104 06/14/2022 1126   BILITOT 0.5 06/14/2022 1126   GFRNONAA >60 09/09/2022 1156   GFRAA 57 (L) 10/18/2016 0523    No imaging done  EKG: No EKG taken  ASSESSMENT & PLAN:   Assessment & Plan by Problem: Principal Problem:   Acute GI bleeding   Zoe Cobb is a 67 y.o. person living with past medical history of atrial fibrillation on chronic coumadin, morbid obesity with BMI of 85, Multiple Sclerosis, CHF, and hypertension who presents with rectal and vaginal bleeding  #Acute GI bleed #Atrial Fibrillation on Chronic Coumadin Pt presents with recurrent episodes of GI bleeding. She has required blood transfusions previously, and was thought to be secondary to GI bleeds. GI currently on board, and will follow up recommendations. Hb stable at 8.8, which seems to be around her baseline. GI recommends observation over night to ensure she does not require transfusion given history of recurrent bleeds.   Likely in the setting of coumadin use. She was recently treated with antibiotics and her last INR was 4.1 after  starting antibiotics. She is not sure which antibiotics she took. Most recent INR was 3.5 in the emergency department. Will hold coumadin and track INR.   Pt has already agreed to blood transfusion if necessary  Plan:  - Appreciate GI Recommendations  - Clear liquid diet  - Hold Coumadin  - Daily INR - Check Iron Studies  - H+H Q6H - Transfuse if Hb less than 8, per GI   #Vaginal bleeding Pt was recently seen by OB/GYN doctor on 1/29, and is supposed to be scheduled for D&C and EUA. She has never had a pap smear, and last menstrual period was 30-40 years ago. Vaginal bleeding has been going on for a year, and she denies any pain.   Plan:  - Will likely follow up with outpatient OB   #HFpEF 55-60% #  HTN Unable to assess volume status given body habitus, however pt does say that she does not seem volume overloaded. Pt is currently normotensive.   Plan:  Will hold medications, as patient is normotensive. Can add back on if pt becomes hypertensive.   #Multiple sclerosis Not currently being managed outpatient, however has not had a flair since 1996.   #Obesity Pt's BMI is 85, will need follow up outpatient for lifestyle modifications.     Diet: Heart Healthy IVF: None,None Code: Full  Prior to Admission Living Arrangement: Home Anticipated Discharge Location: Home Barriers to Discharge: Medical Management  Dispo: Admit patient to Observation with expected length of stay less than 2 midnights.  Signed: Drucie Opitz, MD Internal Medicine Resident PGY-1  09/10/2022, 6:19 AM   Dr. Drucie Opitz, MD Pager [phone]

## 2022-09-10 ENCOUNTER — Encounter (HOSPITAL_COMMUNITY): Payer: Self-pay | Admitting: Internal Medicine

## 2022-09-10 DIAGNOSIS — I4891 Unspecified atrial fibrillation: Secondary | ICD-10-CM | POA: Diagnosis not present

## 2022-09-10 DIAGNOSIS — D509 Iron deficiency anemia, unspecified: Secondary | ICD-10-CM

## 2022-09-10 DIAGNOSIS — K922 Gastrointestinal hemorrhage, unspecified: Secondary | ICD-10-CM | POA: Diagnosis not present

## 2022-09-10 DIAGNOSIS — K5731 Diverticulosis of large intestine without perforation or abscess with bleeding: Secondary | ICD-10-CM | POA: Diagnosis not present

## 2022-09-10 DIAGNOSIS — N939 Abnormal uterine and vaginal bleeding, unspecified: Secondary | ICD-10-CM | POA: Diagnosis not present

## 2022-09-10 DIAGNOSIS — R791 Abnormal coagulation profile: Secondary | ICD-10-CM | POA: Insufficient documentation

## 2022-09-10 LAB — HEMOGLOBIN AND HEMATOCRIT, BLOOD
HCT: 30.5 % — ABNORMAL LOW (ref 36.0–46.0)
Hemoglobin: 8.9 g/dL — ABNORMAL LOW (ref 12.0–15.0)

## 2022-09-10 LAB — BASIC METABOLIC PANEL
Anion gap: 7 (ref 5–15)
BUN: 10 mg/dL (ref 8–23)
CO2: 27 mmol/L (ref 22–32)
Calcium: 8.1 mg/dL — ABNORMAL LOW (ref 8.9–10.3)
Chloride: 104 mmol/L (ref 98–111)
Creatinine, Ser: 0.6 mg/dL (ref 0.44–1.00)
GFR, Estimated: >60 mL/min (ref >60)
Glucose, Bld: 118 mg/dL — ABNORMAL HIGH (ref 70–99)
Potassium: 3.7 mmol/L (ref 3.5–5.1)
Sodium: 138 mmol/L (ref 135–145)

## 2022-09-10 LAB — PROTIME-INR
INR: 3.4 — ABNORMAL HIGH (ref 0.8–1.2)
Prothrombin Time: 34.1 seconds — ABNORMAL HIGH (ref 11.4–15.2)

## 2022-09-10 MED ORDER — SODIUM CHLORIDE 0.9 % IV SOLN
250.0000 mg | Freq: Once | INTRAVENOUS | Status: AC
  Administered 2022-09-10: 250 mg via INTRAVENOUS
  Filled 2022-09-10: qty 20

## 2022-09-10 NOTE — Progress Notes (Signed)
Patient ID: Zoe Cobb, female   DOB: 06/06/1956, 67 y.o.   MRN: [id number]    Progress Note   Subjective   Day # 2  CC;rectal bleeding/vaginal bleeding  HGb 8.9  stable INR 3.4  Ferritin 14 Serum iron 25/TIBC 354/iron sat 7   Coumadin - on hold  Patient says she feels fine, no abdominal pain or cramping she is hungry she has not had any bowel movements today     Objective   Vital signs in last 24 hours: Temp:  [97.3 F (36.3 C)-98.3 F (36.8 C)] 98.3 F (36.8 C) (02/04 0706) Pulse Rate:  [57-70] 61 (02/04 0706) Resp:  [18-20] 18 (02/04 0706) BP: (98-117)/(59-85) 105/65 (02/04 0706) SpO2:  [98 %-100 %] 98 % (02/04 0706) Weight:  [204.1 kg] 204.1 kg (02/03 1205) Last BM Date : 09/09/22 General:    older AA female in NAD resting comfortably Heart:  Regular rate and rhythm; no murmurs Lungs: Respirations even and unlabored, lungs CTA bilaterally Abdomen: Obese, nontender nondistended. Normal bowel sounds. Extremities:  Without edema. Neurologic:  Alert and oriented,  grossly normal neurologically. Psych:  Cooperative. Normal mood and affect.  Intake/Output from previous day: 02/03 0701 - 02/04 0700 In: -  Out: 325 [Urine:325] Intake/Output this shift: No intake/output data recorded.  Lab Results: Recent Labs    09/09/22 1156 09/09/22 1603 09/09/22 2204 09/10/22 0557  WBC 6.5  --   --   --   HGB 8.8* 8.4* 8.7* 8.9*  HCT 30.1* 29.6* 29.5* 30.5*  PLT 441*  --   --   --    BMET Recent Labs    09/09/22 1156 09/10/22 0557  NA 136 138  K 3.9 3.7  CL 99 104  CO2 27 27  GLUCOSE 101* 118*  BUN 11 10  CREATININE 0.70 0.60  CALCIUM 8.2* 8.1*   LFT No results for input(s): "PROT", "ALBUMIN", "AST", "ALT", "ALKPHOS", "BILITOT", "BILIDIR", "IBILI" in the last 72 hours. PT/INR Recent Labs    09/09/22 1445 09/10/22 0557  LABPROT 35.0* 34.1*  INR 3.5* 3.4*         Assessment / Plan:    #74  67 year old African-American female with onset of  lower GI bleeding on 09/08/2022 noting dark red blood and clots with a bowel movement in the setting of chronic Coumadin. Patient had admission in May 2022 with similar presentation, had colonoscopy at that time was negative other than multiple diverticuli in the left colon and bleeding was felt to be diverticular in etiology.  Suspect this was a low-grade diverticular bleed possibly exacerbated by supratherapeutic INR (likely due to antibiotics for tooth abscess)  She has not had any active bleeding since admission-  #3 chronic intermittent vaginal bleeding over the past year definitely contributing to her chronic anemia and likely iron deficiency She saw GYN last week and plan is for D&C and EUA off Coumadin  #4 acute on chronic anemia, iron deficient Patient should have initiation of IV iron while inpatient  #5 chronic atrial fibrillation-on Coumadin #6 congestive heart failure #7 morbid obesity #8 multiple sclerosis  Plan; advance diet to regular Continue to hold Coumadin until INR in her therapeutic range, then plan to resume Continue to trend hemoglobin IV iron-will order-she may need to see hematology as an outpatient, as may require further iron infusions  No plans for endoscopic evaluation this admission  GI will sign off, please call for problems      Principal Problem:   Acute GI bleeding Active  Problems:   Paroxysmal atrial fibrillation (HCC)   Long term (current) use of anticoagulants   Supratherapeutic INR     LOS: 0 days     PA-C 09/10/2022, 11:03 AM

## 2022-09-10 NOTE — Progress Notes (Signed)
SCD comfort sleeves are not properly fix for the patient. Unable to find XL sleeves. M size sleeves just fixed for the R/L extrimity.

## 2022-09-10 NOTE — Progress Notes (Signed)
Subjective:   Summary: Zoe Cobb is a 67 y.o. year old female currently admitted on the IMTS HD#1 for acute GI bleed.  Overnight Events: NOE   Pt was seen at bedside this AM. She states she is feeling well, and does not have any acute complaints. She has not had a bowel movement so far. Nurses were cleaning her up last night and they did notice some blood. She denies any dizziness.   Objective:  Vital signs in last 24 hours: Vitals:   09/09/22 1756 09/09/22 1948 09/10/22 0425 09/10/22 0706  BP: 117/85 116/76 (!) 98/59 105/65  Pulse: (!) 57 70 63 61  Resp: 20 20 20 18   Temp: 97.6 F (36.4 C) 97.7 F (36.5 C) (!) 97.3 F (36.3 C) 98.3 F (36.8 C)  TempSrc: Oral Oral Oral Oral  SpO2: 99% 100% 99% 98%  Weight:      Height:       Supplemental O2: Room Air SpO2: 98 %   Physical Exam:  Constitutional: Morbidly obese female, pleasant, in no acute distress Cardiovascular: RRR, no murmurs, rubs or gallops Pulmonary/Chest: normal work of breathing on room air, lungs clear to auscultation bilaterally Abdominal: soft, non-tender, non-distended, non-tender on palpation Skin: warm and dry   Filed Weights   09/09/22 1205  Weight: (!) 204.1 kg     Intake/Output Summary (Last 24 hours) at 09/10/2022 1114 Last data filed at 09/10/2022 0600 Gross per 24 hour  Intake --  Output 325 ml  Net -325 ml   Net IO Since Admission: -325 mL [09/10/22 1114]  Pertinent Labs:    Latest Ref Rng & Units 09/10/2022    5:57 AM 09/09/2022   10:04 PM 09/09/2022    4:03 PM  CBC  Hemoglobin 12.0 - 15.0 g/dL 8.9  8.7  8.4   Hematocrit 36.0 - 46.0 % 30.5  29.5  29.6        Latest Ref Rng & Units 09/10/2022    5:57 AM 09/09/2022   11:56 AM 06/14/2022   11:26 AM  CMP  Glucose 70 - 99 mg/dL 118  101  127   BUN 8 - 23 mg/dL 10  11  22    Creatinine 0.44 - 1.00 mg/dL 0.60  0.70  0.88   Sodium 135 - 145 mmol/L 138  136  141   Potassium 3.5 - 5.1 mmol/L 3.7  3.9  3.5   Chloride 98  - 111 mmol/L 104  99  104   CO2 22 - 32 mmol/L 27  27  26    Calcium 8.9 - 10.3 mg/dL 8.1  8.2  8.5   Total Protein 6.5 - 8.1 g/dL   8.1   Total Bilirubin 0.3 - 1.2 mg/dL   0.5   Alkaline Phos 38 - 126 U/L   104   AST 15 - 41 U/L   27   ALT 0 - 44 U/L   11       Assessment/Plan:   Principal Problem:   Acute GI bleeding Active Problems:   Paroxysmal atrial fibrillation (HCC)   Long term (current) use of anticoagulants   Supratherapeutic INR   Patient Summary: Zoe Cobb is a 67 y.o. with a pertinent PMH of  atrial fibrillation on chronic coumadin, morbid obesity with BMI of 85, Multiple Sclerosis, CHF, and hypertension , who presented with rectal and vaginal bleeding and admitted for acute GI bleed.    #  Acute GI Bleed in the Setting of Supra therapeutic INR #Iron Deficiency Anemia #Atrial Fibrillation on Chronic Coumadin Pt states that last night when nurses were cleaning her, they noticed some blood. She is asymptomatic, with no chest pain, heart flutters, or dizziness. Hemoglobin has remained stable, currently at 8.9, which appears to be around her baseline. INR currently at 3.4. Pt has been taking warfarin since 2017.  Per chart review, cardiology was intending on switching her from warfarin to eliquis, however do not think patient made appointment or got lost to follow up. Discussed with pharmacy, and they would like to ensure INR is below 2 before transitioning.   Iron studies consistent with IDA, will supplement with IV iron.   Plan:  - Continue holding warfarin  - Appreciate GI recommendations  - Daily INR checks - IV Iron 250mg , will need supplementation in the outpatient setting    #Vaginal Bleeding Pt will follow up in the outpatient with her OB for D&C and EUA.   #HFpEF 55-60% #HTN No signs of volume overload and pt is currently normotensive. Will continue to hold blood pressure medications.    Diet: Normal IVF: None,None Code: Full   Dispo:  Anticipated discharge to Home in 2 days pending medical management.   Drucie Opitz, MD PGY-1 Internal Medicine Resident Pager Number [phone] Please contact the on call pager after 5 pm and on weekends at [phone].

## 2022-09-11 ENCOUNTER — Other Ambulatory Visit (HOSPITAL_COMMUNITY): Payer: Self-pay

## 2022-09-11 LAB — PROTIME-INR
INR: 3.6 — ABNORMAL HIGH (ref 0.8–1.2)
Prothrombin Time: 35.8 seconds — ABNORMAL HIGH (ref 11.4–15.2)

## 2022-09-11 LAB — CBC
HCT: 27.6 % — ABNORMAL LOW (ref 36.0–46.0)
Hemoglobin: 8.2 g/dL — ABNORMAL LOW (ref 12.0–15.0)
MCH: 21.5 pg — ABNORMAL LOW (ref 26.0–34.0)
MCHC: 29.7 g/dL — ABNORMAL LOW (ref 30.0–36.0)
MCV: 72.3 fL — ABNORMAL LOW (ref 80.0–100.0)
Platelets: 386 10*3/uL (ref 150–400)
RBC: 3.82 MIL/uL — ABNORMAL LOW (ref 3.87–5.11)
RDW: 18.6 % — ABNORMAL HIGH (ref 11.5–15.5)
WBC: 7.1 10*3/uL (ref 4.0–10.5)
nRBC: 0 % (ref 0.0–0.2)

## 2022-09-11 LAB — BASIC METABOLIC PANEL
Anion gap: 7 (ref 5–15)
BUN: 10 mg/dL (ref 8–23)
CO2: 27 mmol/L (ref 22–32)
Calcium: 8.3 mg/dL — ABNORMAL LOW (ref 8.9–10.3)
Chloride: 104 mmol/L (ref 98–111)
Creatinine, Ser: 0.71 mg/dL (ref 0.44–1.00)
GFR, Estimated: >60 mL/min (ref >60)
Glucose, Bld: 128 mg/dL — ABNORMAL HIGH (ref 70–99)
Potassium: 3.8 mmol/L (ref 3.5–5.1)
Sodium: 138 mmol/L (ref 135–145)

## 2022-09-11 MED ORDER — SODIUM CHLORIDE 0.9 % IV SOLN
250.0000 mg | Freq: Once | INTRAVENOUS | Status: AC
  Administered 2022-09-11: 250 mg via INTRAVENOUS
  Filled 2022-09-11: qty 20

## 2022-09-11 NOTE — TOC Transition Note (Signed)
Transition of Care [facility]) - CM/SW Discharge Note   Patient Details  Name: Shalana Jardin MRN: [id number] Date of Birth: Mar 26, 1956  [facility] CM/SW Contact:  Curlene Labrum, RN Phone Number: 09/11/2022, 3:55 PM   Clinical Narrative:    CM spoke with the primary RN and the patient will need PTAR transportation to home due to generalized weakness and high fall risk - Hx MS.  The patient states that she has keys to her home and her wife is present in the home at this time and is expecting patient to return to the home by ambulance transport.  Resources were included in the discharge instructions for Senior resources and [facility] resources for transportation.  The patient was provided with Medicare Observation letter.  PTAR packet was placed at secretary's desk to include facesheet and Medical Necessity form for transport to home.         Patient Goals and CMS Choice      Discharge Placement                         Discharge Plan and Services Additional resources added to the After Visit Summary for                                       Social Determinants of Health (SDOH) Interventions SDOH Screenings   Depression (PHQ2-9): Low Risk  (09/04/2022)  Tobacco Use: Medium Risk (09/10/2022)     Readmission Risk Interventions     No data to display

## 2022-09-11 NOTE — Discharge Instructions (Addendum)
It was a pleasure meeting you. You were brought in for some rectal and vaginal bleeding. This was likely due to the diverticulitis as well as the warfarin you were taking. Your last INR today is 3.6. Please stop taking the warfarin. We also talked about another medication called Eliquis, which may be a better fit for you. Please follow up with your primary care provider as soon as you can to continue getting your INR checked. Once it goes below 2 it can be safe to start the eliquis.   Your blood pressure has also been on the lower side here, so I'd encourage you to stop taking them, except the metoprolol, until you're follow up appointment.   Please also follow up with your OB/GYN for the procedures they'd like to do. If you're not able to follow up with your primary care doctor, please make an appointment with our clinic, the number is [phone].  Take care!

## 2022-09-11 NOTE — TOC Benefit Eligibility Note (Signed)
Patient Advocate Encounter  Insurance verification completed.    The patient is currently admitted and upon discharge could be taking Eliquis 5 mg.  The current 30 day co-pay is $0.00.   The patient is insured through [employer] Medicare Part D    , [facility] Pharmacy Patient Advocate Specialist [facility] Patient Advocate Team Direct Number: [phone]  Fax: [phone]       

## 2022-09-11 NOTE — Care Management Obs Status (Cosign Needed)
[facility] NOTIFICATION   Patient Details  Name: Zoe Cobb MRN: [id number] Date of Birth: [birth year]   Medicare Observation Status Notification Given:  Yes    Curlene Labrum, RN 09/11/2022, 3:42 PM

## 2022-09-11 NOTE — Discharge Summary (Addendum)
Name: Zoe Cobb MRN: [id number] DOB: [birth year] 67 y.o. PCP: Alvester Chou, NP  Date of Admission: 09/09/2022 11:48 AM Date of Discharge: 09/11/22 Attending Physician: Velna Ochs, MD  Discharge Diagnosis: 1. Principal Problem:   Acute GI bleeding Active Problems:   Paroxysmal atrial fibrillation (HCC)   Long term (current) use of anticoagulants   Supratherapeutic INR    Discharge Medications: Allergies as of 09/11/2022       Reactions   Penicillins Swelling   Has patient had a PCN reaction causing immediate rash, facial/tongue/throat swelling, SOB or lightheadedness with hypotension: Yes Has patient had a PCN reaction causing severe rash involving mucus membranes or skin necrosis: No Has patient had a PCN reaction that required hospitalization No Has patient had a PCN reaction occurring within the last 10 years: No If all of the above answers are "NO", then may proceed with Cephalosporin use.        Medication List     STOP taking these medications    clindamycin 300 MG capsule Commonly known as: CLEOCIN   isosorbide mononitrate 60 MG 24 hr tablet Commonly known as: IMDUR   torsemide 20 MG tablet Commonly known as: DEMADEX   warfarin 2.5 MG tablet Commonly known as: Coumadin   warfarin 5 MG tablet Commonly known as: COUMADIN       TAKE these medications    acetaminophen 500 MG tablet Commonly known as: TYLENOL Take 500 mg by mouth every 6 (six) hours as needed for moderate pain, mild pain, fever or headache.   metoprolol succinate 50 MG 24 hr tablet Commonly known as: TOPROL-XL Take 0.5 tablets (25 mg total) by mouth daily. What changed: how much to take   ONE A DAY WOMEN 50 PLUS PO Take 1 tablet by mouth daily.        Disposition and follow-up:   [name] was discharged from [facility] in Good condition.  At the hospital follow up visit please address:  1.  Please assess if bleeding has recurred. She  will also need an INR checked. If INR below 2, can consider the initiation of eliquis, seems like cardiology wanted to try her on this but she may have been lost to follow up. She has an appointment with them on 09/30/22. Please also assess HB and symptoms of anemia, supplement iron as needed.    2.  Labs / imaging needed at time of follow-up: BMP, CBC, INR  3.  Pending labs/ test needing follow-up: NA  Follow-up Appointments:  PCP Alvester Chou - 09/12/22 Cardiology: 09/30/22  Hospital Course by problem list:  #Acute GI Bleed IN the Setting of Supra Therapeutic INR  #Iron Deficiency Anemia  #Atrial Fibrillation on Chronic Coumadin  Pt initially presented complaining of bleeding from rectum and vagina. These have been recurring issues per chart review. Her last endoscopy/colonoscopy was in May of 2022, which was positive for diverticulosis. Pt was also thought to have another diverticulosis bleed while here, and GI did not think she was a candidate for colonoscopy in this setting either. INR on initial evaluation was 3.5, very elevated from her target of 2-3. She had recently started antibiotics for an abscess in her mouth, and three days after checked her INR was 4.1. Before initiating antibiotic, her INR was 2.3.   INR continues to be elevated, most recent one being 3.6. Likely result of the bleeding mixed in with diverticulosis flare. Held Warfarin, and would recommend pt switch to eliquis in the outpatient setting  once INR has normalized to below 2. She denies any bleeding on day of discharge.   Pt also has known history of iron deficiency anemia, and was given multiple doses of IV iron to supplement. Hemoglobin has been stable, hovering around 8-9 which appears to be around her baseline.   #Vaginal Bleeding  Pt was recently seen by OB/GYN doctor on 1/29, and is going to undergo D&C and EUA. She has never had a pap smear, and last menstrual peiod was 30-40 years ago. Hb has been stable, and will  undergo these procedures in the outpatient setting.   #HFpEF 55-60% #HTN  Unable to adequately assess volume status given body habitus throughout hospital course, however pt did not feel like she was retaining fluid. Blood pressure has been on the softer side, so will hold her blood pressure medications until follow up with PCP.   #Multiple Sclerosis  Pt has not had flair since 1996     Discharge Exam:   BP (!) 100/55 Comment: RN recheck  Pulse 74   Temp 98.5 F (36.9 C) (Oral)   Resp 16   Ht 5\' 1"  (1.549 m)   Wt (!) 204.1 kg   SpO2 97%   BMI 85.03 kg/m  Discharge exam:   Constitutional: Morbidly Obese female, pleasant, in no acute distress HENT: normocephalic atraumatic, mucous membranes moist Eyes: conjunctiva non-erythematous Neck: supple Cardiovascular: regular rate and rhythm, no m/r/g Pulmonary/Chest: normal work of breathing on room air, lungs clear to auscultation bilaterally Abdominal: soft, non-tender, non-distended MSK: normal bulk and tone Neurological: alert & oriented x 3, 5/5 strength in bilateral upper and lower extremities, normal gait Skin: warm and dry Genitourinary: Unable to visualize given body habitus Psych: normal mood and affect  Pertinent Labs, Studies, and Procedures:     Latest Ref Rng & Units 09/11/2022    4:01 AM 09/10/2022    5:57 AM 09/09/2022   11:56 AM  BMP  Glucose 70 - 99 mg/dL 128  118  101   BUN 8 - 23 mg/dL 10  10  11    Creatinine 0.44 - 1.00 mg/dL 0.71  0.60  0.70   Sodium 135 - 145 mmol/L 138  138  136   Potassium 3.5 - 5.1 mmol/L 3.8  3.7  3.9   Chloride 98 - 111 mmol/L 104  104  99   CO2 22 - 32 mmol/L 27  27  27    Calcium 8.9 - 10.3 mg/dL 8.3  8.1  8.2        Latest Ref Rng & Units 09/11/2022    4:01 AM 09/10/2022    5:57 AM 09/09/2022   10:04 PM  CBC  WBC 4.0 - 10.5 K/uL 7.1     Hemoglobin 12.0 - 15.0 g/dL 8.2  8.9  8.7   Hematocrit 36.0 - 46.0 % 27.6  30.5  29.5   Platelets 150 - 400 K/uL 386          Discharge  Instructions: Discharge Instructions     Call MD for:  persistant nausea and vomiting   Complete by: As directed    Call MD for:  severe uncontrolled pain   Complete by: As directed    Call MD for:  temperature >100.4   Complete by: As directed    Diet - low sodium heart healthy   Complete by: As directed    Increase activity slowly   Complete by: As directed        Signed: Drucie Opitz, MD  09/11/2022, 2:28 PM   Pager: [phone]

## 2022-09-20 ENCOUNTER — Telehealth: Payer: Self-pay | Admitting: *Deleted

## 2022-09-20 NOTE — Telephone Encounter (Signed)
Pt left VM message stating that she is calling to provide an update for the doctor that she saw recently.

## 2022-09-25 NOTE — Telephone Encounter (Signed)
Called patient and someone answered stating she was outside right now. Asked them to let the patient know we were returning her phone call & to call us back if she needs anything. They stated they will.

## 2022-09-27 ENCOUNTER — Ambulatory Visit: Payer: 59 | Admitting: Cardiology

## 2022-09-27 DIAGNOSIS — I480 Paroxysmal atrial fibrillation: Secondary | ICD-10-CM

## 2022-09-27 LAB — POCT INR: INR: 1.2 — AB (ref 2.0–3.0)

## 2022-09-27 NOTE — Progress Notes (Signed)
Description   09/27/2022    INR today was 1.2. Patient goal level is (2.0-3.0)  Prior dose was 5 mg Monday Wednesday and 2.5 mg all the other days. Patient mention that she was in the hospital due to GI bleeding and was told to stop all her medication. Patient has not been on her warfarin since 09/11/2022.   Patient will now start back on 5 mg Monday, Wednesday, and 2.5 mg all the other days.   Pt will recheck INR 1 weeks on 02/28.   Lab Results      Component                Value               Date                      INR                      1.2 (A)             09/27/2022                INR                      3.6 (H)             09/11/2022                INR                      3.4 (H)             09/10/2022               Paroxysmal atrial fibrillation (HCC)  (primary encounter diagnosis) Plan: POCT INR Patient recently was in the hospital on 09/09/2022 with GI bleed secondary to supratherapeutic INR, reviewed her charts.  She was also on antibiotics.  However INR is now subtherapeutic as she has been asked to stop taking all medications.  Will reinitiate at previous dose and monitor closely, goal INR between 2 and 2.5 possibly.

## 2022-10-04 ENCOUNTER — Ambulatory Visit: Payer: 59 | Admitting: Cardiology

## 2022-10-04 DIAGNOSIS — I480 Paroxysmal atrial fibrillation: Secondary | ICD-10-CM

## 2022-10-04 LAB — POCT INR: POC INR: 1.4

## 2022-10-04 NOTE — Progress Notes (Signed)
Description   10/04/2022   INR today was 1.4. Patient goal level is (2.0-2.5)  Prior dose was 5 mg Monday, Wednesday, Friday and 2.5 mg all the other days.  patient will now start back on 2.5 mg Monday, Friday, and 5 mg all the other days.   Pt will recheck INR 2 weeks on 10/18/2022.  Lab Results      Component                Value               Date                      INR                      1.4                 10/04/2022                INR                      1.2 (A)             09/27/2022                INR                      3.6 (H)             09/11/2022            Paroxysmal atrial fibrillation (HCC)  (primary encounter diagnosis) Plan: POCT INR

## 2022-10-17 ENCOUNTER — Telehealth: Payer: Self-pay | Admitting: Family Medicine

## 2022-10-17 NOTE — Telephone Encounter (Signed)
Patient would like a call back to give a message to Dr. Currie Paris

## 2022-10-18 ENCOUNTER — Ambulatory Visit: Payer: 59 | Admitting: Cardiology

## 2022-10-18 DIAGNOSIS — I480 Paroxysmal atrial fibrillation: Secondary | ICD-10-CM

## 2022-10-18 LAB — POCT INR: POC INR: 3.4

## 2022-10-18 NOTE — Telephone Encounter (Signed)
Pt states that she has not heard anything in regards to her surgical clearance from her PCP and Cardiologist.  I explaiend to the pt that she would need to contact both providers to receive surgical clearance.  Reviewed chart with Ajewole who recommends that pt get an U/S scheduled and to have an appt scheduled after U/S.  U/S scheduled for  10/27/22 at 1300 at [facility], come in thru Entrance C.  Pt notified providers recommendation.   Pt verbalized understanding.   Zoe Cobb  10/18/22

## 2022-10-18 NOTE — Progress Notes (Signed)
Description   10/18/2022    INR today was 3.4. Patient goal level is (2.0-2.5)  Prior dose was 2.5 mg Monday, Friday, and 5 mg all the other days.  patient will now start back on 2.5 mg Monday, Wednesday, Friday , and 5 mg all the other days.  Pt will recheck INR 2 weeks on 11/01/2022.  Lab Results      Component                Value               Date                      INR                      3.4                 10/18/2022                INR                      1.4                 10/04/2022                INR                      1.2 (A)             09/27/2022            Paroxysmal atrial fibrillation (HCC)  (primary encounter diagnosis) Plan: POCT INR

## 2022-10-25 ENCOUNTER — Telehealth: Payer: Self-pay

## 2022-10-25 NOTE — Telephone Encounter (Signed)
Patient is calling to ask why she hasn't been discharged from the at home INR monitoring company? Caryl Pina from the oxygen company is supposed to discontinue the home INR. This is the email [email or web address]. Patient says this has not been done. I am unclear on how to proceed.

## 2022-10-27 ENCOUNTER — Other Ambulatory Visit: Payer: Self-pay | Admitting: Obstetrics and Gynecology

## 2022-10-27 ENCOUNTER — Ambulatory Visit (HOSPITAL_COMMUNITY)
Admission: RE | Admit: 2022-10-27 | Discharge: 2022-10-27 | Disposition: A | Discharge status: Home / Self Care | Payer: 59 | Source: Ambulatory Visit | Attending: Obstetrics and Gynecology | Admitting: Obstetrics and Gynecology

## 2022-10-27 DIAGNOSIS — N950 Postmenopausal bleeding: Secondary | ICD-10-CM

## 2022-11-01 ENCOUNTER — Telehealth: Payer: Self-pay

## 2022-11-01 NOTE — Telephone Encounter (Signed)
-----   Message from Zoe Cheney, MD sent at 10/30/2022  9:06 AM EDT ----- Please contact patient and note that ultrasound reviewed - recommend hysteroscopy (procedure where camera used to look inside uterus and get a sample). She will need cardiac clearance for procedure.

## 2022-11-01 NOTE — Telephone Encounter (Signed)
Call placed back to pt. Spoke with pt. Pt given results and update from [name] per Dr Currie Paris. Pt verbalized understanding. Has appt with Dr Currie Paris to discuss further about procedure on 4/9 at 1035am. Pt agreeable to plan of care and appt.  Colletta Maryland, RNC

## 2022-11-02 ENCOUNTER — Ambulatory Visit: Payer: 59 | Admitting: Obstetrics and Gynecology

## 2022-11-06 ENCOUNTER — Encounter (HOSPITAL_COMMUNITY): Payer: Self-pay

## 2022-11-06 ENCOUNTER — Inpatient Hospital Stay (HOSPITAL_COMMUNITY)
Admission: EM | Admit: 2022-11-06 | Discharge: 2022-11-09 | DRG: 378 | Disposition: A | Discharge status: Home / Self Care | Payer: 59 | Attending: Family Medicine | Admitting: Family Medicine

## 2022-11-06 ENCOUNTER — Emergency Department (HOSPITAL_COMMUNITY): Payer: 59

## 2022-11-06 DIAGNOSIS — Z830 Family history of human immunodeficiency virus [HIV] disease: Secondary | ICD-10-CM

## 2022-11-06 DIAGNOSIS — Z800 Family history of malignant neoplasm of digestive organs: Secondary | ICD-10-CM

## 2022-11-06 DIAGNOSIS — R262 Difficulty in walking, not elsewhere classified: Secondary | ICD-10-CM | POA: Diagnosis present

## 2022-11-06 DIAGNOSIS — Z833 Family history of diabetes mellitus: Secondary | ICD-10-CM

## 2022-11-06 DIAGNOSIS — D509 Iron deficiency anemia, unspecified: Secondary | ICD-10-CM | POA: Diagnosis present

## 2022-11-06 DIAGNOSIS — I480 Paroxysmal atrial fibrillation: Secondary | ICD-10-CM | POA: Diagnosis present

## 2022-11-06 DIAGNOSIS — D6832 Hemorrhagic disorder due to extrinsic circulating anticoagulants: Secondary | ICD-10-CM | POA: Diagnosis present

## 2022-11-06 DIAGNOSIS — I110 Hypertensive heart disease with heart failure: Secondary | ICD-10-CM | POA: Diagnosis present

## 2022-11-06 DIAGNOSIS — Z880 Allergy status to penicillin: Secondary | ICD-10-CM

## 2022-11-06 DIAGNOSIS — D62 Acute posthemorrhagic anemia: Secondary | ICD-10-CM | POA: Diagnosis present

## 2022-11-06 DIAGNOSIS — Z7901 Long term (current) use of anticoagulants: Secondary | ICD-10-CM

## 2022-11-06 DIAGNOSIS — K5733 Diverticulitis of large intestine without perforation or abscess with bleeding: Principal | ICD-10-CM | POA: Diagnosis present

## 2022-11-06 DIAGNOSIS — N939 Abnormal uterine and vaginal bleeding, unspecified: Secondary | ICD-10-CM | POA: Diagnosis present

## 2022-11-06 DIAGNOSIS — Z87891 Personal history of nicotine dependence: Secondary | ICD-10-CM

## 2022-11-06 DIAGNOSIS — K5793 Diverticulitis of intestine, part unspecified, without perforation or abscess with bleeding: Secondary | ICD-10-CM

## 2022-11-06 DIAGNOSIS — Z841 Family history of disorders of kidney and ureter: Secondary | ICD-10-CM

## 2022-11-06 DIAGNOSIS — Z8249 Family history of ischemic heart disease and other diseases of the circulatory system: Secondary | ICD-10-CM

## 2022-11-06 DIAGNOSIS — I10 Essential (primary) hypertension: Secondary | ICD-10-CM | POA: Diagnosis present

## 2022-11-06 DIAGNOSIS — Z6841 Body Mass Index (BMI) 40.0 and over, adult: Secondary | ICD-10-CM

## 2022-11-06 DIAGNOSIS — I5032 Chronic diastolic (congestive) heart failure: Secondary | ICD-10-CM | POA: Diagnosis present

## 2022-11-06 DIAGNOSIS — G35 Multiple sclerosis: Secondary | ICD-10-CM | POA: Diagnosis present

## 2022-11-06 DIAGNOSIS — K8020 Calculus of gallbladder without cholecystitis without obstruction: Secondary | ICD-10-CM | POA: Diagnosis present

## 2022-11-06 DIAGNOSIS — K625 Hemorrhage of anus and rectum: Secondary | ICD-10-CM | POA: Diagnosis not present

## 2022-11-06 DIAGNOSIS — Z79899 Other long term (current) drug therapy: Secondary | ICD-10-CM

## 2022-11-06 DIAGNOSIS — R791 Abnormal coagulation profile: Secondary | ICD-10-CM | POA: Diagnosis present

## 2022-11-06 DIAGNOSIS — R1032 Left lower quadrant pain: Secondary | ICD-10-CM

## 2022-11-06 LAB — COMPREHENSIVE METABOLIC PANEL
ALT: 12 U/L (ref 0–44)
AST: 19 U/L (ref 15–41)
Albumin: 2.3 g/dL — ABNORMAL LOW (ref 3.5–5.0)
Alkaline Phosphatase: 130 U/L — ABNORMAL HIGH (ref 38–126)
Anion gap: 10 (ref 5–15)
BUN: 23 mg/dL (ref 8–23)
CO2: 27 mmol/L (ref 22–32)
Calcium: 8 mg/dL — ABNORMAL LOW (ref 8.9–10.3)
Chloride: 100 mmol/L (ref 98–111)
Creatinine, Ser: 0.72 mg/dL (ref 0.44–1.00)
GFR, Estimated: >60 mL/min (ref >60)
Glucose, Bld: 194 mg/dL — ABNORMAL HIGH (ref 70–99)
Potassium: 3.8 mmol/L (ref 3.5–5.1)
Sodium: 137 mmol/L (ref 135–145)
Total Bilirubin: 0.3 mg/dL (ref 0.3–1.2)
Total Protein: 8 g/dL (ref 6.5–8.1)

## 2022-11-06 LAB — CBC WITH DIFFERENTIAL/PLATELET
Abs Immature Granulocytes: 0.04 10*3/uL (ref 0.00–0.07)
Basophils Absolute: 0 10*3/uL (ref 0.0–0.1)
Basophils Relative: 0 %
Eosinophils Absolute: 0.2 10*3/uL (ref 0.0–0.5)
Eosinophils Relative: 2 %
HCT: 32 % — ABNORMAL LOW (ref 36.0–46.0)
Hemoglobin: 9.2 g/dL — ABNORMAL LOW (ref 12.0–15.0)
Immature Granulocytes: 0 %
Lymphocytes Relative: 13 %
Lymphs Abs: 1.5 10*3/uL (ref 0.7–4.0)
MCH: 20.7 pg — ABNORMAL LOW (ref 26.0–34.0)
MCHC: 28.8 g/dL — ABNORMAL LOW (ref 30.0–36.0)
MCV: 72.1 fL — ABNORMAL LOW (ref 80.0–100.0)
Monocytes Absolute: 0.6 10*3/uL (ref 0.1–1.0)
Monocytes Relative: 5 %
Neutro Abs: 8.8 10*3/uL — ABNORMAL HIGH (ref 1.7–7.7)
Neutrophils Relative %: 80 %
Platelets: 336 10*3/uL (ref 150–400)
RBC: 4.44 MIL/uL (ref 3.87–5.11)
RDW: 19.3 % — ABNORMAL HIGH (ref 11.5–15.5)
WBC: 11.1 10*3/uL — ABNORMAL HIGH (ref 4.0–10.5)
nRBC: 0 % (ref 0.0–0.2)

## 2022-11-06 LAB — PROTIME-INR
INR: 4.5 (ref 0.8–1.2)
Prothrombin Time: 42.4 seconds — ABNORMAL HIGH (ref 11.4–15.2)

## 2022-11-06 LAB — TYPE AND SCREEN
ABO/RH(D): A POS
Antibody Screen: NEGATIVE

## 2022-11-06 LAB — LIPASE, BLOOD: Lipase: 38 U/L (ref 11–51)

## 2022-11-06 MED ORDER — SODIUM CHLORIDE 0.9 % IV BOLUS
500.0000 mL | Freq: Once | INTRAVENOUS | Status: AC
  Administered 2022-11-06: 500 mL via INTRAVENOUS

## 2022-11-06 NOTE — ED Triage Notes (Signed)
Pt came in via [facility] from home d/t 2 days with of vaginal & rectal bleeding. A/Ox4, claims to need a couple of pads a day, VSS.

## 2022-11-06 NOTE — ED Notes (Signed)
Unable to get labs with PIV, will have lab tech attempt for labs, Dr. Pearline Cables notified.

## 2022-11-06 NOTE — ED Notes (Signed)
IV team at the bedside to attempt for IV access and labs

## 2022-11-06 NOTE — ED Provider Notes (Signed)
[facility] Provider Note  CSN: [id number] Arrival date & time: 11/06/22 1839  Chief Complaint(s) Rectal Bleeding  HPI Zoe Cobb is a 67 y.o. female with past medical history as below, significant for chronic vaginal bleeding, morbid obesity, PAF on anticoagulation, CHF, hypertension who presents to the ED with complaint of left lower quad abdominal pain, rectal bleeding, chronic vaginal bleeding.  Patient reports 3 days of left lower quadrant abdominal pain described as pressure, sharp sensation.  Rectal bleeding over the past 2 days.  Feels similar prior episode of diverticulitis.  No nausea or vomiting.  Taking her regular medications and tolerating p.o. intake without difficulty.  No recent trauma, no sufficient p.o. intake.  No fevers.   Lavetta Nielsen MD @ [facility].  Ongoing vaginal leading x 1 year intermittent.  She is scheduled for hysteroscopy/ D&C later this month with Dr. Currie Paris.  Has been having intermittent vaginal bleeding over the past year, similar to typical menses in terms of quantity of blood.  Has not worsened.  Past Medical History Past Medical History:  Diagnosis Date   CHF (congestive heart failure)    Heart failure    Hypertension    Morbid obesity    Multiple sclerosis    Paroxysmal atrial fibrillation    Patient Active Problem List   Diagnosis Date Noted   Supratherapeutic INR 09/10/2022   Acute GI bleeding 09/09/2022   Vagina bleeding 11/01/2021   Hyperproteinemia 11/01/2021   Hypoalbuminemia 11/01/2021   Chronic hepatitis C 08/25/2021   Essential hypertension 08/25/2021   Diverticulosis of colon 12/15/2020   Acute on chronic blood loss anemia 12/13/2020   GI bleed 12/12/2020   Anemia    Hematochezia    Lower abdominal pain    Atrial fibrillation 10/31/2018   Long term (current) use of anticoagulants 10/05/2018   Peripheral edema    Thrombocytopenia    Chronic  diastolic (congestive) heart failure ([place]) 09/27/2016   Swelling of left lower extremity 09/27/2016   Paroxysmal atrial fibrillation 09/27/2016   AKI (acute kidney injury) 09/27/2016   Shortness of breath 08/28/2016   Pressure injury of skin 08/28/2016   Morbid obesity due to excess calories 06/28/2015   Home Medication(s) Prior to Admission medications   Medication Sig Start Date End Date Taking? Authorizing Provider  acetaminophen (TYLENOL) 500 MG tablet Take 500 mg by mouth every 6 (six) hours as needed for moderate pain, mild pain, fever or headache.    [provider]  metoprolol succinate (TOPROL-XL) 50 MG 24 hr tablet Take 0.5 tablets (25 mg total) by mouth daily. Patient taking differently: Take 75 mg by mouth daily. 08/29/21   Debbe Odea, MD  Multiple Vitamins-Minerals (ONE A DAY WOMEN 50 PLUS PO) Take 1 tablet by mouth daily.    [provider]  Past Surgical History Past Surgical History:  Procedure Laterality Date   COLONOSCOPY WITH PROPOFOL N/A 12/15/2020   Procedure: COLONOSCOPY WITH PROPOFOL;  Surgeon: Ladene Artist, MD;  Location: [facility] ENDOSCOPY;  Service: Endoscopy;  Laterality: N/A;   Family History Family History  Problem Relation Age of Onset   Hypertension Mother    Diabetes Mother    Stomach cancer Father    Kidney disease Sister    Diabetes Sister    Diabetes Sister    Diabetes Sister    Heart attack Brother    HIV/AIDS Brother    Diabetes Brother     Social History Social History   Tobacco Use   Smoking status: Former    Packs/day: 1.00    Years: 9.00    Additional pack years: 0.00    Total pack years: 9.00    Types: Cigarettes    Quit date: 1996    Years since quitting: 28.2   Smokeless tobacco: Never  Vaping Use   Vaping Use: Never used  Substance Use Topics   Alcohol use: No     Alcohol/week: 0.0 standard drinks of alcohol   Drug use: No   Allergies Penicillins  Review of Systems Review of Systems  Constitutional:  Negative for activity change and fever.  HENT:  Negative for facial swelling and trouble swallowing.   Eyes:  Negative for discharge and redness.  Respiratory:  Negative for cough and shortness of breath.   Cardiovascular:  Negative for chest pain and palpitations.  Gastrointestinal:  Positive for abdominal pain and blood in stool. Negative for nausea.  Genitourinary:  Positive for vaginal bleeding. Negative for dysuria and flank pain.  Musculoskeletal:  Negative for back pain and gait problem.  Skin:  Negative for pallor and rash.  Neurological:  Negative for syncope and headaches.    Physical Exam Vital Signs  I have reviewed the triage vital signs BP 109/65   Pulse 86   Temp 98.5 F (36.9 C) (Oral)   Resp 18   Ht 5\' 1"  (1.549 m)   Wt (!) 209.3 kg Comment: bed scale  SpO2 91%   BMI 87.18 kg/m  Physical Exam Vitals and nursing note reviewed.  Constitutional:      General: She is not in acute distress.    Appearance: Normal appearance. She is obese.  HENT:     Head: Normocephalic and atraumatic.     Right Ear: External ear normal.     Left Ear: External ear normal.     Nose: Nose normal.     Mouth/Throat:     Mouth: Mucous membranes are moist.  Eyes:     General: No scleral icterus.       Right eye: No discharge.        Left eye: No discharge.  Cardiovascular:     Rate and Rhythm: Normal rate and regular rhythm.     Pulses: Normal pulses.     Heart sounds: Normal heart sounds.  Pulmonary:     Effort: Pulmonary effort is normal. No respiratory distress.     Breath sounds: Normal breath sounds.  Abdominal:     General: Abdomen is flat.     Palpations: Abdomen is soft.     Tenderness: There is abdominal tenderness in the left lower quadrant. There is no guarding or rebound.       Comments: Not peritoneal   Musculoskeletal:     Right lower leg: Edema present.     Left lower leg: Edema  present.  Skin:    General: Skin is warm and dry.     Capillary Refill: Capillary refill takes less than 2 seconds.  Neurological:     Mental Status: She is alert.  Psychiatric:        Mood and Affect: Mood normal.        Behavior: Behavior normal.     ED Results and Treatments Labs (all labs ordered are listed, but only abnormal results are displayed) Labs Reviewed  CBC WITH DIFFERENTIAL/PLATELET - Abnormal; Notable for the following components:      Result Value   WBC 11.1 (*)    Hemoglobin 9.2 (*)    HCT 32.0 (*)    MCV 72.1 (*)    MCH 20.7 (*)    MCHC 28.8 (*)    RDW 19.3 (*)    Neutro Abs 8.8 (*)    All other components within normal limits  COMPREHENSIVE METABOLIC PANEL - Abnormal; Notable for the following components:   Glucose, Bld 194 (*)    Calcium 8.0 (*)    Albumin 2.3 (*)    Alkaline Phosphatase 130 (*)    All other components within normal limits  PROTIME-INR - Abnormal; Notable for the following components:   Prothrombin Time 42.4 (*)    INR 4.5 (*)    All other components within normal limits  LIPASE, BLOOD  URINALYSIS, ROUTINE W REFLEX MICROSCOPIC  TYPE AND SCREEN                                                                                                                          Radiology No results found.  Pertinent labs & imaging results that were available during my care of the patient were reviewed by me and considered in my medical decision making (see MDM for details).  Medications Ordered in ED Medications  sodium chloride 0.9 % bolus 500 mL (0 mLs Intravenous Stopped 11/06/22 2217)                                                                                                                                     Procedures Procedures  (including critical care time)  Medical Decision Making / ED Course    Medical Decision Making:    Zoe Cobb is a 67 y.o. female with past medical history as below, significant for chronic vaginal bleeding, morbid obesity, PAF on anticoagulation, CHF,  hypertension who presents to the ED with complaint of left lower quad abdominal pain, rectal bleeding, chronic vaginal bleeding.  . The complaint involves an extensive differential diagnosis and also carries with it a high risk of complications and morbidity.  Serious etiology was considered. Ddx includes but is not limited to: Differential diagnosis includes but is not exclusive to ectopic pregnancy, ovarian cyst, ovarian torsion, acute appendicitis, urinary tract infection, endometriosis, bowel obstruction, hernia, colitis, renal colic, gastroenteritis, volvulus etc.   Complete initial physical exam performed, notably the patient  was no acute distress, resting comfortably, abdomen is soft, nonperitoneal.    Reviewed and confirmed nursing documentation for past medical history, family history, social history.  Vital signs reviewed.    Clinical Course as of 11/07/22 0046  Mon Nov 06, 2022  1907 She is on warfarin for PAF [SG]  1907 Echo 1/18 with LVEF 55 to 60%, G2 DD [SG]  2203 Hemoglobin(!): 9.2 Similar to baseline [SG]  2232 INR(!!): 4.5 Warfarin for PAF, recommend hold her warfarin at this time.  [SG]    Clinical Course User Index [SG] Jeanell Sparrow, DO   INR+, HBG stable; she is on warfarin for PAF Advised her to hold next warfarin dose and get INR recheck with pcp She is HDS, pain is minimal, unsure if she is still bleeding, has not had BM while in the ED She is pending CTAP, I have suspicion for diverticulitis. Pt signed out to incoming EDP pending CTAP and ultimate dispo     Additional history obtained: -Additional history obtained from ems -External records from outside source obtained and reviewed including: Chart review including previous notes, labs, imaging, consultation notes including home medications, prior labs and  imaging, primary care documentation   Lab Tests: -I ordered, reviewed, and interpreted labs.   The pertinent results include:   Labs Reviewed  CBC WITH DIFFERENTIAL/PLATELET - Abnormal; Notable for the following components:      Result Value   WBC 11.1 (*)    Hemoglobin 9.2 (*)    HCT 32.0 (*)    MCV 72.1 (*)    MCH 20.7 (*)    MCHC 28.8 (*)    RDW 19.3 (*)    Neutro Abs 8.8 (*)    All other components within normal limits  COMPREHENSIVE METABOLIC PANEL - Abnormal; Notable for the following components:   Glucose, Bld 194 (*)    Calcium 8.0 (*)    Albumin 2.3 (*)    Alkaline Phosphatase 130 (*)    All other components within normal limits  PROTIME-INR - Abnormal; Notable for the following components:   Prothrombin Time 42.4 (*)    INR 4.5 (*)    All other components within normal limits  LIPASE, BLOOD  URINALYSIS, ROUTINE W REFLEX MICROSCOPIC  TYPE AND SCREEN    Notable for INR elevated hemoglobin stable  EKG   EKG Interpretation  Date/Time:    Ventricular Rate:    PR Interval:    QRS Duration:   QT Interval:    QTC Calculation:   R Axis:     Text Interpretation:           Imaging Studies ordered: I ordered imaging studies including CTAP I independently visualized the following imaging with scope of interpretation limited to determining acute life threatening conditions related to emergency care; findings noted above, significant for pending I independently visualized and interpreted imaging. I agree with the radiologist interpretation   Medicines ordered and prescription drug management: Meds ordered  this encounter  Medications   sodium chloride 0.9 % bolus 500 mL    -I have reviewed the patients home medicines and have made adjustments as needed   Consultations Obtained: na   Cardiac Monitoring: The patient was maintained on a cardiac monitor.  I personally viewed and interpreted the cardiac monitored which showed an underlying rhythm of:  SR  Social Determinants of Health:  Diagnosis or treatment significantly limited by social determinants of health: obesity   Reevaluation: After the interventions noted above, I reevaluated the patient and found that they have improved  Co morbidities that complicate the patient evaluation  Past Medical History:  Diagnosis Date   CHF (congestive heart failure)    Heart failure    Hypertension    Morbid obesity    Multiple sclerosis    Paroxysmal atrial fibrillation       Dispostion: Disposition decision including need for hospitalization was considered, and patient disposition pending at time of sign out.    Final Clinical Impression(s) / ED Diagnoses Final diagnoses:  Elevated INR  Left lower quadrant abdominal pain     This chart was dictated using voice recognition software.  Despite best efforts to proofread,  errors can occur which can change the documentation meaning.    Jeanell Sparrow, DO 11/07/22 [phone]

## 2022-11-07 ENCOUNTER — Emergency Department (HOSPITAL_COMMUNITY): Payer: 59

## 2022-11-07 ENCOUNTER — Other Ambulatory Visit: Payer: Self-pay

## 2022-11-07 ENCOUNTER — Encounter (HOSPITAL_COMMUNITY): Payer: Self-pay | Admitting: Internal Medicine

## 2022-11-07 DIAGNOSIS — K8020 Calculus of gallbladder without cholecystitis without obstruction: Secondary | ICD-10-CM | POA: Diagnosis present

## 2022-11-07 DIAGNOSIS — K5733 Diverticulitis of large intestine without perforation or abscess with bleeding: Secondary | ICD-10-CM | POA: Diagnosis present

## 2022-11-07 DIAGNOSIS — R262 Difficulty in walking, not elsewhere classified: Secondary | ICD-10-CM | POA: Diagnosis present

## 2022-11-07 DIAGNOSIS — R791 Abnormal coagulation profile: Secondary | ICD-10-CM

## 2022-11-07 LAB — CBC
HCT: 33 % — ABNORMAL LOW (ref 36.0–46.0)
Hemoglobin: 9.8 g/dL — ABNORMAL LOW (ref 12.0–15.0)
MCH: 21.1 pg — ABNORMAL LOW (ref 26.0–34.0)
MCHC: 29.7 g/dL — ABNORMAL LOW (ref 30.0–36.0)
MCV: 71.1 fL — ABNORMAL LOW (ref 80.0–100.0)
Platelets: 355 10*3/uL (ref 150–400)
RBC: 4.64 MIL/uL (ref 3.87–5.11)
RDW: 19.3 % — ABNORMAL HIGH (ref 11.5–15.5)
WBC: 7.3 10*3/uL (ref 4.0–10.5)
nRBC: 0 % (ref 0.0–0.2)

## 2022-11-07 LAB — GLUCOSE, CAPILLARY: Glucose-Capillary: 155 mg/dL — ABNORMAL HIGH (ref 70–99)

## 2022-11-07 MED ORDER — ONDANSETRON HCL 4 MG/2ML IJ SOLN: 4.0000 mg | Freq: Four times a day (QID) | INTRAMUSCULAR | Status: DC | PRN

## 2022-11-07 MED ORDER — MORPHINE SULFATE (PF) 2 MG/ML IV SOLN: 2.0000 mg | INTRAVENOUS | Status: DC | PRN

## 2022-11-07 MED ORDER — LACTATED RINGERS IV SOLN: INTRAVENOUS | Status: DC

## 2022-11-07 MED ORDER — INSULIN ASPART 100 UNIT/ML IJ SOLN: 0.0000 [IU] | Freq: Three times a day (TID) | INTRAMUSCULAR | Status: DC

## 2022-11-07 MED ORDER — ONDANSETRON HCL 4 MG PO TABS: 4.0000 mg | ORAL_TABLET | Freq: Four times a day (QID) | ORAL | Status: DC | PRN

## 2022-11-07 MED ORDER — METRONIDAZOLE 500 MG PO TABS
500.0000 mg | ORAL_TABLET | Freq: Once | ORAL | Status: AC
  Administered 2022-11-07: 500 mg via ORAL
  Filled 2022-11-07: qty 1

## 2022-11-07 MED ORDER — PHYTONADIONE 1 MG/0.5 ML ORAL SOLUTION
1.0000 mg | Freq: Once | ORAL | Status: AC
  Administered 2022-11-07: 1 mg via ORAL
  Filled 2022-11-07: qty 0.5

## 2022-11-07 MED ORDER — ACETAMINOPHEN 650 MG RE SUPP: 650.0000 mg | Freq: Four times a day (QID) | RECTAL | Status: DC | PRN

## 2022-11-07 MED ORDER — CIPROFLOXACIN HCL 500 MG PO TABS
500.0000 mg | ORAL_TABLET | Freq: Once | ORAL | Status: AC
  Administered 2022-11-07: 500 mg via ORAL
  Filled 2022-11-07: qty 1

## 2022-11-07 MED ORDER — HYDRALAZINE HCL 20 MG/ML IJ SOLN: 5.0000 mg | INTRAMUSCULAR | Status: DC | PRN

## 2022-11-07 MED ORDER — ACETAMINOPHEN 325 MG PO TABS: 650.0000 mg | ORAL_TABLET | Freq: Four times a day (QID) | ORAL | Status: DC | PRN

## 2022-11-07 MED ORDER — CIPROFLOXACIN HCL 500 MG PO TABS
500.0000 mg | ORAL_TABLET | Freq: Two times a day (BID) | ORAL | Status: DC
  Administered 2022-11-07 – 2022-11-09 (×4): 500 mg via ORAL
  Filled 2022-11-07 (×6): qty 1

## 2022-11-07 MED ORDER — METRONIDAZOLE 500 MG PO TABS
500.0000 mg | ORAL_TABLET | Freq: Two times a day (BID) | ORAL | Status: DC
  Administered 2022-11-07 – 2022-11-09 (×5): 500 mg via ORAL
  Filled 2022-11-07 (×5): qty 1

## 2022-11-07 MED ORDER — IOHEXOL 350 MG/ML SOLN
100.0000 mL | Freq: Once | INTRAVENOUS | Status: AC | PRN
  Administered 2022-11-07: 100 mL via INTRAVENOUS

## 2022-11-07 MED ORDER — INSULIN ASPART 100 UNIT/ML IJ SOLN: 0.0000 [IU] | Freq: Every day | INTRAMUSCULAR | Status: DC

## 2022-11-07 NOTE — Progress Notes (Signed)
Spoke with Tim from [place] , got a bariatric bed ordered for patient.

## 2022-11-07 NOTE — Progress Notes (Addendum)
ANTICOAGULATION CONSULT NOTE - Initial Consult  Pharmacy Consult for coumadin Indication: atrial fibrillation  Allergies  Allergen Reactions   Penicillins Swelling    Has patient had a PCN reaction causing immediate rash, facial/tongue/throat swelling, SOB or lightheadedness with hypotension: Yes Has patient had a PCN reaction causing severe rash involving mucus membranes or skin necrosis: No Has patient had a PCN reaction that required hospitalization No Has patient had a PCN reaction occurring within the last 10 years: No If all of the above answers are "NO", then may proceed with Cephalosporin use.     Patient Measurements: Height: 5\' 1"  (154.9 cm) Weight: (!) 209.3 kg (461 lb 6.4 oz) (bed scale) IBW/kg (Calculated) : 47.8 Heparin Dosing Weight:   Vital Signs: Temp: 97.7 F (36.5 C) (04/02 1456) Temp Source: Oral (04/02 0921) BP: 101/63 (04/02 1456) Pulse Rate: 70 (04/02 1456)  Labs: Recent Labs    11/06/22 2040  HGB 9.2*  HCT 32.0*  PLT 336  LABPROT 42.4*  INR 4.5*  CREATININE 0.72    Estimated Creatinine Clearance: 117.8 mL/min (by C-G formula based on SCr of 0.72 mg/dL).   Medical History: Past Medical History:  Diagnosis Date   CHF (congestive heart failure)    Hypertension    Morbid obesity    Multiple sclerosis    Paroxysmal atrial fibrillation     Medications:  Medications Prior to Admission  Medication Sig Dispense Refill Last Dose   acetaminophen (TYLENOL) 500 MG tablet Take 500 mg by mouth every 6 (six) hours as needed for moderate pain, mild pain, fever or headache.   Past Week   metoprolol succinate (TOPROL-XL) 50 MG 24 hr tablet Take 0.5 tablets (25 mg total) by mouth daily. (Patient taking differently: Take 75 mg by mouth daily.)   11/06/2022 at 0830   Multiple Vitamins-Minerals (ONE A DAY WOMEN 50 PLUS PO) Take 1 tablet by mouth daily.   11/06/2022   torsemide (DEMADEX) 20 MG tablet Take 20 mg by mouth 2 (two) times daily.   11/06/2022 at am    warfarin (COUMADIN) 5 MG tablet Take 2.5-5 mg by mouth See admin instructions. Take 1 tablet by mouth daily, except on Monday, Wednesday, Friday take 1/2 tablet   11/06/2022 at 0830   Scheduled:   ciprofloxacin  500 mg Oral BID   [START ON 11/08/2022] insulin aspart  0-20 Units Subcutaneous TID WC   insulin aspart  0-5 Units Subcutaneous QHS   metroNIDAZOLE  500 mg Oral Q12H   Infusions:   lactated ringers 100 mL/hr at 11/07/22 1238    Assessment: Pt has been on coumadin for afib. Her admission INR was 4.5 and she was admitted for vaginal and rectal bleeding. Note mentioned that vitamin k was given x1 in the ED but no dose was charted. Rx consulted to continue coumadin.   We will repeat INR today. Hgb 9.2, plt wnl  PTA coumadin - 5mg  qday except 2.5mg  MWF  Goal of Therapy:  INR 2-3 Monitor platelets by anticoagulation protocol: Yes   Plan:  INR tonight  Will see if coumadin is needed Daily INR  Onnie Boer, PharmD, BCIDP, AAHIVP, CPP Infectious Disease Pharmacist 11/07/2022 7:42 PM  Addendum (2215): INR remains elevated at 4.7 (no vit K given in ED). Pt continues with vaginal bleeding - hasn't worsened by no better per RN. Plan: D/w Dr. Bridgett Larsson and will give vit K 1mg  po tonight and f/u INR in a.m. No coumadin tonight  Sherlon Handing, PharmD, BCPS Please see amion  for complete clinical pharmacist phone list 11/07/2022 10:18 PM

## 2022-11-07 NOTE — ED Notes (Signed)
ED TO INPATIENT HANDOFF REPORT  ED Nurse Name and Phone #: Ivin Poot Name/Age/Gender Zoe Cobb 67 y.o. female Room/Bed: 005C/005C  Code Status   Code Status: Prior  Home/SNF/Other Home Patient oriented to: self, place, time, and situation Is this baseline? Yes   Triage Complete: Triage complete  Chief Complaint Supratherapeutic INR [R79.1]  Triage Note Pt came in via [facility] from home d/t 2 days with of vaginal & rectal bleeding. A/Ox4, claims to need a couple of pads a day, VSS.   Allergies Allergies  Allergen Reactions   Penicillins Swelling    Has patient had a PCN reaction causing immediate rash, facial/tongue/throat swelling, SOB or lightheadedness with hypotension: Yes Has patient had a PCN reaction causing severe rash involving mucus membranes or skin necrosis: No Has patient had a PCN reaction that required hospitalization No Has patient had a PCN reaction occurring within the last 10 years: No If all of the above answers are "NO", then may proceed with Cephalosporin use.     Level of Care/Admitting Diagnosis ED Disposition     ED Disposition  Admit   Condition  --   Comment  Hospital Area: [place] [[id number]]  Level of Care: Med-Surg [16]  May place patient in observation at [facility] or [place] if equivalent level of care is available:: Yes  Covid Evaluation: Asymptomatic - no recent exposure (last 10 days) testing not required  Diagnosis: Supratherapeutic INR [id number]  Admitting Physician: Karmen Bongo [[id number]]  Attending Physician: Karmen Bongo [[id number]]          B Medical/Surgery History Past Medical History:  Diagnosis Date   CHF (congestive heart failure)    Hypertension    Morbid obesity    Multiple sclerosis    Paroxysmal atrial fibrillation    Past Surgical History:  Procedure Laterality Date   COLONOSCOPY WITH PROPOFOL N/A 12/15/2020   Procedure: COLONOSCOPY WITH PROPOFOL;  Surgeon: Ladene Artist, MD;  Location: [place];  Service: Endoscopy;  Laterality: N/A;     A IV Location/Drains/Wounds Patient Lines/Drains/Airways Status     Active Line/Drains/Airways     Name Placement date Placement time Site Days   Peripheral IV 11/06/22 20 G 1" Distal;Posterior;Right Forearm 11/06/22  2007  Forearm  1            Intake/Output Last 24 hours  Intake/Output Summary (Last 24 hours) at 11/07/2022 0828 Last data filed at 11/06/2022 2217 Gross per 24 hour  Intake 500 ml  Output --  Net 500 ml    Labs/Imaging Results for orders placed or performed during the hospital encounter of 11/06/22 (from the past 48 hour(s))  CBC with Differential     Status: Abnormal   Collection Time: 11/06/22  8:40 PM  Result Value Ref Range   WBC 11.1 (H) 4.0 - 10.5 K/uL   RBC 4.44 3.87 - 5.11 MIL/uL   Hemoglobin 9.2 (L) 12.0 - 15.0 g/dL   HCT 32.0 (L) 36.0 - 46.0 %   MCV 72.1 (L) 80.0 - 100.0 fL   MCH 20.7 (L) 26.0 - 34.0 pg   MCHC 28.8 (L) 30.0 - 36.0 g/dL   RDW 19.3 (H) 11.5 - 15.5 %   Platelets 336 150 - 400 K/uL   nRBC 0.0 0.0 - 0.2 %   Neutrophils Relative % 80 %   Neutro Abs 8.8 (H) 1.7 - 7.7 K/uL   Lymphocytes Relative 13 %   Lymphs Abs 1.5 0.7 - 4.0 K/uL  Monocytes Relative 5 %   Monocytes Absolute 0.6 0.1 - 1.0 K/uL   Eosinophils Relative 2 %   Eosinophils Absolute 0.2 0.0 - 0.5 K/uL   Basophils Relative 0 %   Basophils Absolute 0.0 0.0 - 0.1 K/uL   Immature Granulocytes 0 %   Abs Immature Granulocytes 0.04 0.00 - 0.07 K/uL    Comment: Performed at [facility], [place] [address] [place], [place] [address]  Comprehensive metabolic panel     Status: Abnormal   Collection Time: 11/06/22  8:40 PM  Result Value Ref Range   Sodium 137 135 - 145 mmol/L   Potassium 3.8 3.5 - 5.1 mmol/L   Chloride 100 98 - 111 mmol/L   CO2 27 22 - 32 mmol/L   Glucose, Bld 194 (H) 70 - 99 mg/dL    Comment: Glucose reference range applies only to samples taken after fasting for at  least 8 hours.   BUN 23 8 - 23 mg/dL   Creatinine, Ser 0.72 0.44 - 1.00 mg/dL   Calcium 8.0 (L) 8.9 - 10.3 mg/dL   Total Protein 8.0 6.5 - 8.1 g/dL   Albumin 2.3 (L) 3.5 - 5.0 g/dL   AST 19 15 - 41 U/L   ALT 12 0 - 44 U/L   Alkaline Phosphatase 130 (H) 38 - 126 U/L   Total Bilirubin 0.3 0.3 - 1.2 mg/dL   GFR, Estimated >60 >60 mL/min    Comment: (NOTE) Calculated using the CKD-EPI Creatinine Equation (2021)    Anion gap 10 5 - 15    Comment: Performed at [facility], [place] [address], [place], [place] [address]  Lipase, blood     Status: None   Collection Time: 11/06/22  8:40 PM  Result Value Ref Range   Lipase 38 11 - 51 U/L    Comment: Performed at [place] [address] [place], [place] [address]  Protime-INR     Status: Abnormal   Collection Time: 11/06/22  8:40 PM  Result Value Ref Range   Prothrombin Time 42.4 (H) 11.4 - 15.2 seconds    Comment: CORRECTED ON 04/01 AT 2227: PREVIOUSLY REPORTED AS 42.3   INR 4.5 (HH) 0.8 - 1.2    Comment: REPEATED TO VERIFY CRITICAL RESULT CALLED TO, READ BACK BY AND VERIFIED WITH: MANDY FOX 11/06/22 2215 SGALLOWAY (NOTE) INR goal varies based on device and disease states. Performed at [facility], [place] [address], [place], [place] [address] CORRECTED ON 04/01 AT 2227: PREVIOUSLY REPORTED AS 4.5 CRITICAL RESULT CALLED TO, READ BACK BY AND VERIFIED WITH: MANDY FOX RN 11/06/22 2225 SGALLOWAY   Type and screen [place]     Status: None   Collection Time: 11/06/22  8:40 PM  Result Value Ref Range   ABO/RH(D) A POS    Antibody Screen NEG    Sample Expiration      11/09/2022,2359 Performed at [facility], [place] [address], [place],  [address]    CT ABDOMEN PELVIS W CONTRAST  Result Date: 11/07/2022 CLINICAL DATA:  Left lower quadrant pain with rectal bleeding. EXAM: CT ABDOMEN AND PELVIS WITH CONTRAST TECHNIQUE: Multidetector CT imaging of the abdomen and pelvis was performed using  the standard protocol following bolus administration of intravenous contrast. RADIATION DOSE REDUCTION: This exam was performed according to the departmental dose-optimization program which includes automated exposure control, adjustment of the mA and/or kV according to patient size and/or use of iterative  reconstruction technique. CONTRAST:  188mL OMNIPAQUE IOHEXOL 350 MG/ML SOLN COMPARISON:  CTs with IV contrast 06/14/2022, 05/25/2022, 03/10/2022. FINDINGS: Technical note, image quality is limited due to abundant breathing motion artifact an additional artifact from the patient's arms in the field impression body habitus. Lower chest: There is mild cardiomegaly. No pericardial effusion. No lung base infiltrate is seen through the breathing motion. Mild elevation right hemidiaphragm. Hepatobiliary: There is cholelithiasis with the largest rim calcified stone measuring 2.8 cm. No wall thickening or biliary dilatation is seen allowing for motion. The liver is mildly steatotic and no obvious mass is seen through the technical artifacts. Pancreas: No obvious abnormality is seen through the motion artifact. Spleen: No obvious abnormality is seen through the motion artifact. Adrenals/Urinary Tract: There is no adrenal mass. Cortical scarring is again seen posteriorly in the upper pole left kidney. There is a 1.7 cm cyst in the outer right kidney, unchanged, with Hounsfield density of 5.4. A 2.3 cm cyst is also noted in the superior pole of this kidney but not as well seen as previously due to motion. There is an additional 1.3 cm hypodensity posteriorly in upper to mid left kidney which previously measured fat density, today measures Hounsfield density of 6.3 but could be exaggerated due to motion. 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 There are small additional scattered too small to characterize cortical hypodensities. No follow-up imaging is recommended. There is symmetric renal excretion on the delayed images.  No bladder thickening is seen. Stomach/Bowel: Unremarkable gastric wall and small bowel. No small bowel obstruction or inflammation. The appendix is normal caliber. There is diffuse colonic diverticulosis. Moderate constipation. There is mild stranding at the junction of the descending and sigmoid colon consistent with acute diverticulitis. No diverticular abscess is seen or free air. Sizable rectal fecal impaction is again noted without wall thickening. Vascular/Lymphatic: Aortic atherosclerosis. No enlarged abdominal or pelvic lymph nodes. Reproductive: Uterus and bilateral adnexa are unremarkable. Other: Rectus diastasis again noted at and above the level of the umbilicus with outward protrusion and small umbilical fat hernia. There is no incarcerated hernia. There is no free air, free hemorrhage or free fluid. Musculoskeletal: There is osteopenia with degenerative changes thoracic and lumbar spine. There are chronic L5 pars defects with ankylosis of L5-S1 across the collapsed disc space and a grade 1 (borderline grade 2) spondylolisthesis with severe vertical foraminal stenosis. IMPRESSION: 1. Colonic diverticulosis with mild stranding at the junction of the descending and sigmoid colon consistent with acute diverticulitis. No diverticular abscess or free air. 2. Constipation and rectal fecal impaction. 3. Cholelithiasis without evidence of acute cholecystitis. 4. Aortic atherosclerosis. 5. Cardiomegaly. 6. Osteopenia and degenerative change. 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 7. 1.3 cm hypodensity posteriorly in upper to mid left kidney which previously measured fat density, today measures Hounsfield density of 6.3 but could be exaggerated due to motion. Still favor a small angiomyolipoma. Aortic Atherosclerosis (ICD10-I70.0). Electronically Signed   By: Telford Nab M.D.   On: 11/07/2022 01:59    Pending Labs Unresulted Labs (From  admission, onward)     Start     Ordered   11/06/22 1901  Urinalysis, Routine w reflex microscopic -Urine, Clean Catch  (ED Abdominal Pain)  Once,   URGENT       Question:  Specimen Source  Answer:  Urine, Clean Catch   11/06/22 1901            Vitals/Pain Today's Vitals  11/07/22 0300 11/07/22 0509 11/07/22 0600 11/07/22 0802  BP: 103/62  92/66 101/68  Pulse: 97  64 71  Resp: 19  19 19   Temp:  98 F (36.7 C)    TempSrc:  Oral    SpO2: 97%  99% 99%  Weight:      Height:      PainSc:        Isolation Precautions No active isolations  Medications Medications  sodium chloride 0.9 % bolus 500 mL (0 mLs Intravenous Stopped 11/06/22 2217)  iohexol (OMNIPAQUE) 350 MG/ML injection 100 mL (100 mLs Intravenous Contrast Given 11/07/22 0122)  ciprofloxacin (CIPRO) tablet 500 mg (500 mg Oral Given 11/07/22 0225)  metroNIDAZOLE (FLAGYL) tablet 500 mg (500 mg Oral Given 11/07/22 0225)    Mobility walks with device     Focused Assessments gastrointestinal   R Recommendations: See Admitting Provider Note  Report given to:   Additional Notes:

## 2022-11-07 NOTE — H&P (Signed)
History and Physical    Patient: Zoe Cobb [id number] DOB: 15-Nov-1955 DOA: 11/06/2022 DOS: the patient was seen and examined on 11/07/2022 PCP: Alvester Chou, NP  Patient coming from: Home - lives with wife; NOK: Wife, [phone]   Chief Complaint: Vaginal/rectal bleeding  HPI: Zoe Cobb is a 67 y.o. female with medical history significant of afib on Coumadin, HTN, chronic diastolic CHF, and super morbid obesity presenting with vaginal and rectal bleeding. She reports that "my diverticulitis flared up" and I was bleeding.  Bleeding off and on for over a year.  No diarrhea or fever.  Bleeding significantly worsened about 2 days ago, mild vaginal bleeding as well.  Dr. Irven Shelling office manages her Coumadin, was supposed to see them this week.  She thinks this is the second time she has had such an elevated INR.  She is nonambulatory at baseline, uses a [employer] fast streamer to transfer.    ER Course:  Carryover, per Dr. Alcario Drought:  Diverticulitis with bleeding, elevated INR.  Got Vit K in ED.  Got ABx, hemodynamically stable.     Review of Systems: As mentioned in the history of present illness. All other systems reviewed and are negative. Past Medical History:  Diagnosis Date   CHF (congestive heart failure)    Hypertension    Morbid obesity    Multiple sclerosis    Paroxysmal atrial fibrillation    Past Surgical History:  Procedure Laterality Date   COLONOSCOPY WITH PROPOFOL N/A 12/15/2020   Procedure: COLONOSCOPY WITH PROPOFOL;  Surgeon: Ladene Artist, MD;  Location: [facility] ENDOSCOPY;  Service: Endoscopy;  Laterality: N/A;   Social History:  reports that she quit smoking about 28 years ago. Her smoking use included cigarettes. She has a 9.00 pack-year smoking history. She has never used smokeless tobacco. She reports that she does not drink alcohol and does not use drugs.  Allergies  Allergen Reactions   Penicillins Swelling    Has patient had a PCN reaction causing immediate  rash, facial/tongue/throat swelling, SOB or lightheadedness with hypotension: Yes Has patient had a PCN reaction causing severe rash involving mucus membranes or skin necrosis: No Has patient had a PCN reaction that required hospitalization No Has patient had a PCN reaction occurring within the last 10 years: No If all of the above answers are "NO", then may proceed with Cephalosporin use.     Family History  Problem Relation Age of Onset   Hypertension Mother    Diabetes Mother    Stomach cancer Father    Kidney disease Sister    Diabetes Sister    Diabetes Sister    Diabetes Sister    Heart attack Brother    HIV/AIDS Brother    Diabetes Brother     Prior to Admission medications   Medication Sig Start Date End Date Taking? Authorizing Provider  acetaminophen (TYLENOL) 500 MG tablet Take 500 mg by mouth every 6 (six) hours as needed for moderate pain, mild pain, fever or headache.   Yes [provider]  metoprolol succinate (TOPROL-XL) 50 MG 24 hr tablet Take 0.5 tablets (25 mg total) by mouth daily. Patient taking differently: Take 75 mg by mouth daily. 08/29/21  Yes Debbe Odea, MD  Multiple Vitamins-Minerals (ONE A DAY WOMEN 50 PLUS PO) Take 1 tablet by mouth daily.   Yes [provider]  torsemide (DEMADEX) 20 MG tablet Take 20 mg by mouth 2 (two) times daily. 09/29/22  Yes [provider]  warfarin (COUMADIN) 5 MG tablet Take  2.5-5 mg by mouth See admin instructions. Take 1 tablet by mouth daily, except on Monday, Wednesday, Friday take 1/2 tablet 09/29/22  Yes [provider]    Physical Exam: Vitals:   11/07/22 0600 11/07/22 0802 11/07/22 0921 11/07/22 1456  BP: 92/66 101/68 103/67 101/63  Pulse: 64 71 68 70  Resp: 19 19 18    Temp:   (!) 97.5 F (36.4 C) 97.7 F (36.5 C)  TempSrc:   Oral   SpO2: 99% 99% 96% 95%  Weight:      Height:       General:  Appears calm and comfortable and is in NAD Eyes:  EOMI, normal lids, iris ENT:   grossly normal hearing, lips & tongue, mmm Neck:  no LAD, masses or thyromegaly Cardiovascular:  RRR, no m/r/g. No LE edema.  Respiratory:   CTA bilaterally with no wheezes/rales/rhonchi.  Normal respiratory effort. Abdomen:  soft, NT, ND (limited by habitus) Skin:  no rash or induration seen on limited exam Musculoskeletal:  grossly normal tone BUE/BLE, good ROM, no bony abnormality Psychiatric:  grossly normal mood and affect, speech fluent and appropriate, AOx3 Neurologic:  CN 2-12 grossly intact, moves all extremities in coordinated fashion   Radiological Exams on Admission: Independently reviewed - see discussion in A/P where applicable  CT ABDOMEN PELVIS W CONTRAST  Result Date: 11/07/2022 CLINICAL DATA:  Left lower quadrant pain with rectal bleeding. EXAM: CT ABDOMEN AND PELVIS WITH CONTRAST TECHNIQUE: Multidetector CT imaging of the abdomen and pelvis was performed using the standard protocol following bolus administration of intravenous contrast. RADIATION DOSE REDUCTION: This exam was performed according to the departmental dose-optimization program which includes automated exposure control, adjustment of the mA and/or kV according to patient size and/or use of iterative reconstruction technique. CONTRAST:  131mL OMNIPAQUE IOHEXOL 350 MG/ML SOLN COMPARISON:  CTs with IV contrast 06/14/2022, 05/25/2022, 03/10/2022. FINDINGS: Technical note, image quality is limited due to abundant breathing motion artifact an additional artifact from the patient's arms in the field impression body habitus. Lower chest: There is mild cardiomegaly. No pericardial effusion. No lung base infiltrate is seen through the breathing motion. Mild elevation right hemidiaphragm. Hepatobiliary: There is cholelithiasis with the largest rim calcified stone measuring 2.8 cm. No wall thickening or biliary dilatation is seen allowing for motion. The liver is mildly steatotic and no obvious mass is seen through the technical  artifacts. Pancreas: No obvious abnormality is seen through the motion artifact. Spleen: No obvious abnormality is seen through the motion artifact. Adrenals/Urinary Tract: There is no adrenal mass. Cortical scarring is again seen posteriorly in the upper pole left kidney. There is a 1.7 cm cyst in the outer right kidney, unchanged, with Hounsfield density of 5.4. A 2.3 cm cyst is also noted in the superior pole of this kidney but not as well seen as previously due to motion. There is an additional 1.3 cm hypodensity posteriorly in upper to mid left kidney which previously measured fat density, today measures Hounsfield density of 6.3 but could be exaggerated due to motion. 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 There are small additional scattered too small to characterize cortical hypodensities. No follow-up imaging is recommended. There is symmetric renal excretion on the delayed images. No bladder thickening is seen. Stomach/Bowel: Unremarkable gastric wall and small bowel. No small bowel obstruction or inflammation. The appendix is normal caliber. There is diffuse colonic diverticulosis. Moderate constipation. There is mild stranding at the junction of the descending and sigmoid colon consistent with acute diverticulitis. No  diverticular abscess is seen or free air. Sizable rectal fecal impaction is again noted without wall thickening. Vascular/Lymphatic: Aortic atherosclerosis. No enlarged abdominal or pelvic lymph nodes. Reproductive: Uterus and bilateral adnexa are unremarkable. Other: Rectus diastasis again noted at and above the level of the umbilicus with outward protrusion and small umbilical fat hernia. There is no incarcerated hernia. There is no free air, free hemorrhage or free fluid. Musculoskeletal: There is osteopenia with degenerative changes thoracic and lumbar spine. There are chronic L5 pars defects with ankylosis of L5-S1 across the collapsed disc space and a grade 1 (borderline grade 2)  spondylolisthesis with severe vertical foraminal stenosis. IMPRESSION: 1. Colonic diverticulosis with mild stranding at the junction of the descending and sigmoid colon consistent with acute diverticulitis. No diverticular abscess or free air. 2. Constipation and rectal fecal impaction. 3. Cholelithiasis without evidence of acute cholecystitis. 4. Aortic atherosclerosis. 5. Cardiomegaly. 6. Osteopenia and degenerative change. 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 7. 1.3 cm hypodensity posteriorly in upper to mid left kidney which previously measured fat density, today measures Hounsfield density of 6.3 but could be exaggerated due to motion. Still favor a small angiomyolipoma. Aortic Atherosclerosis (ICD10-I70.0). Electronically Signed   By: Telford Nab M.D.   On: 11/07/2022 01:59    EKG: none   Labs on Admission: I have personally reviewed the available labs and imaging studies at the time of the admission.  Pertinent labs:    Glucose 194 AP 130 Albumin 2.3 WBC 11.1 Hgb 9.2 - stable INR 4.5   Assessment and Plan: Principal Problem:   Supratherapeutic INR Active Problems:   Morbid obesity due to excess calories   Chronic diastolic (congestive) heart failure (HCC)   Paroxysmal atrial fibrillation   Essential hypertension   Diverticulitis of colon with bleeding   Ambulatory dysfunction   Cholelithiasis    Supratherapeutic INR -Patient presenting with rectal > vaginal bleeding -She was found to have a supratherapeutic INR -Uncertain etiology -Overnight coverage reported that she was given vitamin K in the ER, although I do not see this documented -Will follow q12h CBC and INR -Pharmacy consulted re: Coumadin -Will observe for now on Med Surg   Diverticulitis -Patient with reported rectal bleeding on presentation -CT with concern for acute diverticulitis -Will continue PO Cipro/Flagyl, as she does not have obvious  symptoms other than rectal bleeding on presentation  Ambulatory dysfunction -Likely related to habitus -There may also be a h/o MS (no recent neuro notes but this was reported in the chart)  Afib -Marginal BP in the ER - hold Toprol XL -Hold Coumadin in the setting of GI bleeding  Chronic diastolic CHF -Grade 2 diastolic dysfunction with preserved EF in 2018 -Appears compensated at this time  HTN -Marginal BP in the ER - hold Toprol XL  Cholelithiasis -Low suspicion for acute issue -Follow clinically -Unlikely to be a surgical candidate so would probably need perc drain if symptoms arose  Morbid obesity -Body mass index is 87.18 kg/m..  -Weight loss should be encouraged -Outpatient PCP/bariatric medicine f/u encouraged  -She is at high risk of morbidity/mortality due to her severe obesity    Advance Care Planning:   Code Status: Full Code - Code status was discussed with the patient and/or family at the time of admission.  The patient would want to receive full resuscitative measures at this time.   Consults: None  DVT Prophylaxis: SCDs  Family Communication: None present; she is capable  of communicating with family at this time  Severity of Illness: The appropriate patient status for this patient is OBSERVATION. Observation status is judged to be reasonable and necessary in order to provide the required intensity of service to ensure the patient's safety. The patient's presenting symptoms, physical exam findings, and initial radiographic and laboratory data in the context of their medical condition is felt to place them at decreased risk for further clinical deterioration. Furthermore, it is anticipated that the patient will be medically stable for discharge from the hospital within 2 midnights of admission.   Author: Karmen Bongo, MD 11/07/2022 7:26 PM  For on call review [email or web address].

## 2022-11-07 NOTE — ED Provider Notes (Signed)
Patient signed out pending CT imaging and lab work.  Hemoglobin at baseline of 9.2.  INR elevated at 4.5.  Patient has evidence of likely diverticulitis on CT imaging.  Initially she was given oral antibiotics.  However on my reassessment, she endorses that she has had frank bloody stools.  With elevated INR, she is at higher risk for ongoing bleeding.  She was given a dose of vitamin K.  Feel she warrants admission for observation for GI bleed and repeat hemoglobin as well as treatment for her diverticulitis.  She remains hemodynamically stable here.  Physical Exam  BP 103/62   Pulse 97   Temp 98 F (36.7 C) (Oral)   Resp 19   Ht 1.549 m (5\' 1" )   Wt (!) 209.3 kg Comment: bed scale  SpO2 97%   BMI 87.18 kg/m   Physical Exam Morbidly obese, nontoxic Procedures  Procedures  ED Course / MDM   Clinical Course as of 11/07/22 0314  Mon Nov 06, 2022  1907 She is on warfarin for PAF [SG]  1907 Echo 1/18 with LVEF 55 to 60%, G2 DD [SG]  2203 Hemoglobin(!): 9.2 Similar to baseline [SG]  2232 INR(!!): 4.5 Warfarin for PAF, recommend hold her warfarin at this time.  [SG]    Clinical Course User Index [SG] Jeanell Sparrow, DO   Medical Decision Making Amount and/or Complexity of Data Reviewed Labs: ordered. Decision-making details documented in ED Course. Radiology: ordered.  Risk Prescription drug management. Decision regarding hospitalization.   Problem List Items Addressed This Visit       Digestive   GI bleed   Other Visit Diagnoses     Elevated INR    -  Primary   Left lower quadrant abdominal pain                 , Barbette Hair, MD 11/07/22 [phone]

## 2022-11-08 DIAGNOSIS — Z79899 Other long term (current) drug therapy: Secondary | ICD-10-CM | POA: Diagnosis not present

## 2022-11-08 DIAGNOSIS — K8020 Calculus of gallbladder without cholecystitis without obstruction: Secondary | ICD-10-CM | POA: Diagnosis present

## 2022-11-08 DIAGNOSIS — Z880 Allergy status to penicillin: Secondary | ICD-10-CM | POA: Diagnosis not present

## 2022-11-08 DIAGNOSIS — N939 Abnormal uterine and vaginal bleeding, unspecified: Secondary | ICD-10-CM | POA: Diagnosis present

## 2022-11-08 DIAGNOSIS — R791 Abnormal coagulation profile: Secondary | ICD-10-CM | POA: Diagnosis not present

## 2022-11-08 DIAGNOSIS — I5032 Chronic diastolic (congestive) heart failure: Secondary | ICD-10-CM | POA: Diagnosis present

## 2022-11-08 DIAGNOSIS — D509 Iron deficiency anemia, unspecified: Secondary | ICD-10-CM | POA: Diagnosis present

## 2022-11-08 DIAGNOSIS — I480 Paroxysmal atrial fibrillation: Secondary | ICD-10-CM | POA: Diagnosis present

## 2022-11-08 DIAGNOSIS — Z800 Family history of malignant neoplasm of digestive organs: Secondary | ICD-10-CM | POA: Diagnosis not present

## 2022-11-08 DIAGNOSIS — K5733 Diverticulitis of large intestine without perforation or abscess with bleeding: Secondary | ICD-10-CM | POA: Diagnosis present

## 2022-11-08 DIAGNOSIS — D62 Acute posthemorrhagic anemia: Secondary | ICD-10-CM | POA: Diagnosis not present

## 2022-11-08 DIAGNOSIS — Z833 Family history of diabetes mellitus: Secondary | ICD-10-CM | POA: Diagnosis not present

## 2022-11-08 DIAGNOSIS — I110 Hypertensive heart disease with heart failure: Secondary | ICD-10-CM | POA: Diagnosis present

## 2022-11-08 DIAGNOSIS — Z8249 Family history of ischemic heart disease and other diseases of the circulatory system: Secondary | ICD-10-CM | POA: Diagnosis not present

## 2022-11-08 DIAGNOSIS — K625 Hemorrhage of anus and rectum: Secondary | ICD-10-CM | POA: Diagnosis present

## 2022-11-08 DIAGNOSIS — Z830 Family history of human immunodeficiency virus [HIV] disease: Secondary | ICD-10-CM | POA: Diagnosis not present

## 2022-11-08 DIAGNOSIS — Z7901 Long term (current) use of anticoagulants: Secondary | ICD-10-CM | POA: Diagnosis not present

## 2022-11-08 DIAGNOSIS — Z6841 Body Mass Index (BMI) 40.0 and over, adult: Secondary | ICD-10-CM | POA: Diagnosis not present

## 2022-11-08 DIAGNOSIS — G35 Multiple sclerosis: Secondary | ICD-10-CM | POA: Diagnosis present

## 2022-11-08 DIAGNOSIS — Z87891 Personal history of nicotine dependence: Secondary | ICD-10-CM | POA: Diagnosis not present

## 2022-11-08 DIAGNOSIS — D6832 Hemorrhagic disorder due to extrinsic circulating anticoagulants: Secondary | ICD-10-CM | POA: Diagnosis present

## 2022-11-08 DIAGNOSIS — Z841 Family history of disorders of kidney and ureter: Secondary | ICD-10-CM | POA: Diagnosis not present

## 2022-11-08 LAB — PROTIME-INR
INR: 4.7 (ref 0.8–1.2)
INR: 5.1 (ref 0.8–1.2)
INR: 3.9 — ABNORMAL HIGH (ref 0.8–1.2)
Prothrombin Time: 43.8 seconds — ABNORMAL HIGH (ref 11.4–15.2)
Prothrombin Time: 46.7 seconds — ABNORMAL HIGH (ref 11.4–15.2)
Prothrombin Time: <10 seconds — ABNORMAL LOW (ref 11.4–15.2)
Prothrombin Time: 38.2 seconds — ABNORMAL HIGH (ref 11.4–15.2)

## 2022-11-08 LAB — CBC
HCT: 28 % — ABNORMAL LOW (ref 36.0–46.0)
Hemoglobin: 8.1 g/dL — ABNORMAL LOW (ref 12.0–15.0)
MCH: 20.9 pg — ABNORMAL LOW (ref 26.0–34.0)
MCHC: 28.9 g/dL — ABNORMAL LOW (ref 30.0–36.0)
MCV: 72.2 fL — ABNORMAL LOW (ref 80.0–100.0)
Platelets: 329 10*3/uL (ref 150–400)
RBC: 3.88 MIL/uL (ref 3.87–5.11)
RDW: 19.3 % — ABNORMAL HIGH (ref 11.5–15.5)
WBC: 7.3 10*3/uL (ref 4.0–10.5)
nRBC: 0 % (ref 0.0–0.2)

## 2022-11-08 LAB — BASIC METABOLIC PANEL
Anion gap: 7 (ref 5–15)
BUN: 13 mg/dL (ref 8–23)
CO2: 27 mmol/L (ref 22–32)
Calcium: 8.1 mg/dL — ABNORMAL LOW (ref 8.9–10.3)
Chloride: 104 mmol/L (ref 98–111)
Creatinine, Ser: 0.65 mg/dL (ref 0.44–1.00)
GFR, Estimated: >60 mL/min (ref >60)
Glucose, Bld: 131 mg/dL — ABNORMAL HIGH (ref 70–99)
Potassium: 3.6 mmol/L (ref 3.5–5.1)
Sodium: 138 mmol/L (ref 135–145)

## 2022-11-08 LAB — GLUCOSE, CAPILLARY
Glucose-Capillary: 123 mg/dL — ABNORMAL HIGH (ref 70–99)
Glucose-Capillary: 108 mg/dL — ABNORMAL HIGH (ref 70–99)
Glucose-Capillary: 136 mg/dL — ABNORMAL HIGH (ref 70–99)
Glucose-Capillary: 154 mg/dL — ABNORMAL HIGH (ref 70–99)

## 2022-11-08 LAB — HEMOGLOBIN A1C
Hgb A1c MFr Bld: 7.1 % — ABNORMAL HIGH (ref 4.8–5.6)
Mean Plasma Glucose: 157 mg/dL

## 2022-11-08 MED ORDER — TORSEMIDE 20 MG PO TABS
20.0000 mg | ORAL_TABLET | Freq: Two times a day (BID) | ORAL | Status: DC
  Administered 2022-11-08 – 2022-11-09 (×2): 20 mg via ORAL
  Filled 2022-11-08 (×2): qty 1

## 2022-11-08 MED ORDER — METOPROLOL SUCCINATE ER 25 MG PO TB24
25.0000 mg | ORAL_TABLET | Freq: Every day | ORAL | Status: DC
  Administered 2022-11-09: 25 mg via ORAL
  Filled 2022-11-08: qty 1

## 2022-11-08 MED ORDER — PHYTONADIONE 1 MG/0.5 ML ORAL SOLUTION
1.0000 mg | Freq: Once | ORAL | Status: AC
  Administered 2022-11-08: 1 mg via ORAL
  Filled 2022-11-08: qty 0.5

## 2022-11-08 MED ORDER — SODIUM CHLORIDE 0.9 % IV SOLN
125.0000 mg | Freq: Every day | INTRAVENOUS | Status: DC
  Administered 2022-11-08: 125 mg via INTRAVENOUS
  Filled 2022-11-08 (×3): qty 10

## 2022-11-08 NOTE — Assessment & Plan Note (Signed)
-   Continue metoprolol - Hold warfarin - See above

## 2022-11-08 NOTE — Assessment & Plan Note (Signed)
INR trended up to 5.1 today. - Repeat INR now and repeat vitamin K if needed

## 2022-11-08 NOTE — Assessment & Plan Note (Signed)
Continue Cipro and Flagyl

## 2022-11-08 NOTE — Progress Notes (Signed)
  Progress Note   Patient: Zoe Cobb [id number] DOB: [birth year] DOA: 11/06/2022     0 DOS: the patient was seen and examined on 11/08/2022 at 10:29AM      Brief hospital course: Zoe Cobb is a 67 y.o. 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  Evidently, has had intermittent bleeding over the last year, significantly worse in the last few days.  In the ER, INR 4.5, given 1mg  PO vitamin K.     Assessment and Plan: * Supratherapeutic INR INR trended up to 5.1 today. - Repeat INR now and repeat vitamin K if needed  Diverticulitis of colon with bleeding - Continue Cipro and Flagyl  Acute blood loss anemia on chronic iron deficiency anemia Hgb trended down to 8 today.  Still with vaginal bleeding today, this seems to be improving slightly, but having to change the pads three times overnight. - Trend Hgb  Ambulatory dysfunction Unable to walk at baseline due to obesity.  Essential hypertension BP normal  Paroxysmal atrial fibrillation - Continue metoprolol - Hold warfarin - See above  Chronic diastolic (congestive) heart failure (HCC) Seems at baseline, volume status not possible to estimate given habitus. - Resume metoprolol and torsemide  Morbid obesity due to excess calories BMI 87          Subjective: No complaints, no pain.  Nursing report continued vaginal bleeding, as above.     Physical Exam: BP 123/62 (BP Location: Left Wrist)   Pulse 68   Temp (!) 97.5 F (36.4 C)   Resp 17   Ht 5\' 1"  (1.549 m)   Wt (!) 209.3 kg Comment: bed scale  SpO2 97%   BMI 87.18 kg/m   Superobese adult female, lying in bed, interactive and appropriate RRR, no murmurs, no peripheral edema Respiratory normal, stable rales or wheezes Abdomen soft no tenderness palpation or guarding    Data Reviewed: INR up to 5.1 Hemoglobin down to 8.1 Basic metabolic panel unremarkable  Family Communication: None  present    Disposition: Status is: Inpatient With the patient's worsening hemoglobin, worsening INR, and ongoing vaginal bleeding, I think that discharge home at this time, especially given her weight and difficulties with mobility would be unsafe        Author: Edwin Dada, MD 11/08/2022 3:51 PM  For on call review [email or web address].

## 2022-11-08 NOTE — Assessment & Plan Note (Signed)
Hgb trended down to 8 today.  Still with vaginal bleeding today, this seems to be improving slightly, but having to change the pads three times overnight. - Trend Hgb

## 2022-11-08 NOTE — TOC Transition Note (Addendum)
Transition of Care [facility]) - CM/SW Discharge Note   Patient Details  Name: Zoe Cobb MRN: [id number] Date of Birth: [birth year]  [facility]) CM/SW Contact:  Curlene Labrum, RN Phone Number: 11/08/2022, 2:31 PM   Clinical Narrative:    CM spoke with attending physician and patient may likely discharge to home tomorrow.  I met with the patient at the bedside and provided her with Medicare observation letter.  The patient is morbidly obese and unable to ambulance requiring PTAR transportation to home.  The patient has family at the home.  CM will provide medical necessity and PTAR arrangements to return home when ready to discharge.   Final next level of care: Home/Self Care Barriers to Discharge: No Barriers Identified   Patient Goals and CMS Choice CMS Medicare.gov Compare Post Acute Care list provided to:: Patient Choice offered to / list presented to : Patient  Discharge Placement                         Discharge Plan and Services Additional resources added to the After Visit Summary for     Discharge Planning Services: CM Consult                                 Social Determinants of Health (SDOH) Interventions SDOH Screenings   Food Insecurity: No Food Insecurity (11/07/2022)  Housing: Low Risk  (11/07/2022)  Transportation Needs: No Transportation Needs (11/07/2022)  Utilities: Not At Risk (11/07/2022)  Depression (PHQ2-9): Low Risk  (09/04/2022)  Tobacco Use: Medium Risk (11/07/2022)     Readmission Risk Interventions     No data to display

## 2022-11-08 NOTE — Assessment & Plan Note (Signed)
BP normal 

## 2022-11-08 NOTE — Assessment & Plan Note (Signed)
BMI 87

## 2022-11-08 NOTE — Hospital Course (Signed)
Zoe Cobb is a 67 y.o. 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  Evidently, has had intermittent bleeding over the last year, significantly worse in the last few days.  In the ER, INR 4.5, given 1mg  PO vitamin K.

## 2022-11-08 NOTE — Progress Notes (Signed)
ANTICOAGULATION CONSULT NOTE - follow-up  Pharmacy Consult for coumadin Indication: atrial fibrillation  Allergies  Allergen Reactions   Penicillins Swelling    Has patient had a PCN reaction causing immediate rash, facial/tongue/throat swelling, SOB or lightheadedness with hypotension: Yes Has patient had a PCN reaction causing severe rash involving mucus membranes or skin necrosis: No Has patient had a PCN reaction that required hospitalization No Has patient had a PCN reaction occurring within the last 10 years: No If all of the above answers are "NO", then may proceed with Cephalosporin use.     Patient Measurements: Height: 5\' 1"  (154.9 cm) Weight: (!) 209.3 kg (461 lb 6.4 oz) (bed scale) IBW/kg (Calculated) : 47.8 Heparin Dosing Weight:   Vital Signs: Temp: 98 F (36.7 C) (04/03 0400) Temp Source: Oral (04/03 0400) BP: 99/69 (04/03 0400) Pulse Rate: 92 (04/03 0400)  Labs: Recent Labs    11/06/22 2040 11/07/22 1938 11/08/22 0435  HGB 9.2* 9.8* 8.1*  HCT 32.0* 33.0* 28.0*  PLT 336 355 329  LABPROT 42.4* 43.8* 46.7*  INR 4.5* 4.7* 5.1*  CREATININE 0.72  --  0.65     Estimated Creatinine Clearance: 117.8 mL/min (by C-G formula based on SCr of 0.65 mg/dL).   Medical History: Past Medical History:  Diagnosis Date   CHF (congestive heart failure)    Hypertension    Morbid obesity    Multiple sclerosis    Paroxysmal atrial fibrillation     Medications:  Medications Prior to Admission  Medication Sig Dispense Refill Last Dose   acetaminophen (TYLENOL) 500 MG tablet Take 500 mg by mouth every 6 (six) hours as needed for moderate pain, mild pain, fever or headache.   Past Week   metoprolol succinate (TOPROL-XL) 50 MG 24 hr tablet Take 0.5 tablets (25 mg total) by mouth daily. (Patient taking differently: Take 75 mg by mouth daily.)   11/06/2022 at 0830   Multiple Vitamins-Minerals (ONE A DAY WOMEN 50 PLUS PO) Take 1 tablet by mouth daily.   11/06/2022   torsemide  (DEMADEX) 20 MG tablet Take 20 mg by mouth 2 (two) times daily.   11/06/2022 at am   warfarin (COUMADIN) 5 MG tablet Take 2.5-5 mg by mouth See admin instructions. Take 1 tablet by mouth daily, except on Monday, Wednesday, Friday take 1/2 tablet   11/06/2022 at 0830   Scheduled:   ciprofloxacin  500 mg Oral BID   insulin aspart  0-20 Units Subcutaneous TID WC   insulin aspart  0-5 Units Subcutaneous QHS   metroNIDAZOLE  500 mg Oral Q12H   Infusions:   lactated ringers 100 mL/hr at 11/08/22 0641    Assessment: Pt has been on coumadin for afib. Her admission INR was 4.5 and she was admitted for vaginal and rectal bleeding. Note mentioned that vitamin k was given x1 in the ED but no dose was charted. Rx consulted to continue coumadin.    PTA coumadin - 5mg  qday except 2.5mg  MWF  11/08/2022 AM update: INR 5.1(~04:30) Hgb 8.1 Vitamin K 1 mg PO administered ~ 23:30 Bleeding is unchanged from 4/2   Goal of Therapy:  INR 2-3 Monitor platelets by anticoagulation protocol: Yes   Plan:  No coumadin today INR 1400 Daily INR    BS, PharmD, BCPS Clinical Pharmacist 11/08/2022 9:39 AM  Contact: [phone] after 3 PM  "Be curious, not judgmental..." -Jamal Maes

## 2022-11-08 NOTE — Care Management Obs Status (Cosign Needed)
[place] NOTIFICATION   Patient Details  Name: Zoe Cobb MRN: [id number] Date of Birth: 18-Nov-1955   Medicare Observation Status Notification Given:  Yes    Curlene Labrum, RN 11/08/2022, 1:52 PM

## 2022-11-08 NOTE — Assessment & Plan Note (Signed)
Seems at baseline, volume status not possible to estimate given habitus. - Resume metoprolol and torsemide

## 2022-11-08 NOTE — Assessment & Plan Note (Signed)
Unable to walk at baseline due to obesity.

## 2022-11-09 DIAGNOSIS — R791 Abnormal coagulation profile: Secondary | ICD-10-CM | POA: Diagnosis not present

## 2022-11-09 LAB — PROTIME-INR
INR: 3.4 — ABNORMAL HIGH (ref 0.8–1.2)
INR: 2.9 — ABNORMAL HIGH (ref 0.8–1.2)
Prothrombin Time: 34.4 seconds — ABNORMAL HIGH (ref 11.4–15.2)
Prothrombin Time: 30.3 seconds — ABNORMAL HIGH (ref 11.4–15.2)

## 2022-11-09 LAB — GLUCOSE, CAPILLARY
Glucose-Capillary: 130 mg/dL — ABNORMAL HIGH (ref 70–99)
Glucose-Capillary: 156 mg/dL — ABNORMAL HIGH (ref 70–99)
Glucose-Capillary: 165 mg/dL — ABNORMAL HIGH (ref 70–99)

## 2022-11-09 LAB — CBC
HCT: 27.1 % — ABNORMAL LOW (ref 36.0–46.0)
Hemoglobin: 7.9 g/dL — ABNORMAL LOW (ref 12.0–15.0)
MCH: 20.6 pg — ABNORMAL LOW (ref 26.0–34.0)
MCHC: 29.2 g/dL — ABNORMAL LOW (ref 30.0–36.0)
MCV: 70.6 fL — ABNORMAL LOW (ref 80.0–100.0)
Platelets: 331 10*3/uL (ref 150–400)
RBC: 3.84 MIL/uL — ABNORMAL LOW (ref 3.87–5.11)
RDW: 19.3 % — ABNORMAL HIGH (ref 11.5–15.5)
WBC: 7.6 10*3/uL (ref 4.0–10.5)
nRBC: 0 % (ref 0.0–0.2)

## 2022-11-09 MED ORDER — WARFARIN SODIUM 1 MG PO TABS: 1.0000 mg | ORAL_TABLET | Freq: Every day | ORAL | 0 refills | Status: DC

## 2022-11-09 MED ORDER — FERROUS SULFATE 325 (65 FE) MG PO TBEC: 325.0000 mg | DELAYED_RELEASE_TABLET | Freq: Two times a day (BID) | ORAL | 3 refills | Status: DC

## 2022-11-09 MED ORDER — DOCUSATE SODIUM 100 MG PO CAPS: 100.0000 mg | ORAL_CAPSULE | Freq: Every day | ORAL | 2 refills | Status: DC

## 2022-11-09 MED ORDER — WARFARIN SODIUM 2.5 MG PO TABS: 2.5000 mg | ORAL_TABLET | Freq: Every day | ORAL | 0 refills | Status: DC

## 2022-11-09 NOTE — TOC Progression Note (Signed)
Transition of Care [facility]) - Progression Note    Patient Details  Name: Zoe Cobb MRN: [id number] Date of Birth: 07-24-1956  Transition of Care [facility]) CM/SW [place], RN Phone Number: 11/09/2022, 10:45 AM  Clinical Narrative:    CM met with the patient at the bedside and patient is being discharged home.  The patient needs ambulance transport to home.  PTAR was called and Medical necessity was completed.  Bedside nursing will provide the patient with discharge instructions prior to discharge to home.   Expected Discharge Plan: Home/Self Care Barriers to Discharge: No Barriers Identified  Expected Discharge Plan and Services   Discharge Planning Services: CM Consult   Living arrangements for the past 2 months: Apartment Expected Discharge Date: 11/09/22                                     Social Determinants of Health (SDOH) Interventions SDOH Screenings   Food Insecurity: No Food Insecurity (11/07/2022)  Housing: Low Risk  (11/07/2022)  Transportation Needs: No Transportation Needs (11/07/2022)  Utilities: Not At Risk (11/07/2022)  Depression (PHQ2-9): Low Risk  (09/04/2022)  Tobacco Use: Medium Risk (11/07/2022)    Readmission Risk Interventions     No data to display

## 2022-11-09 NOTE — Discharge Summary (Signed)
Physician Discharge Summary   Patient: Zoe Cobb MRN: [id number] DOB: [birth year]  Admit date:     11/06/2022  Discharge date: 11/09/22  Discharge Physician: Edwin Dada   PCP: Alvester Chou, NP     Recommendations at discharge:  Follow up with PCP Dr. Aris Lot in 1 week for supratherapeutic INR Follow up with Dr. Currie Paris for post-menopausal bleeding     Discharge Diagnoses: Principal Problem:   Supratherapeutic INR Active Problems:   Diverticulitis of colon with bleeding   Acute blood loss anemia on chronic iron deficiency anemia   Morbid obesity due to excess calories   Chronic diastolic (congestive) heart failure (HCC)   Paroxysmal atrial fibrillation   Essential hypertension   Ambulatory dysfunction      Hospital Course: [name] is a 67 y.o. 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  Evidently, has had intermittent bleeding over the last year, significantly worse in the last few days.  In the ER, INR 4.5, given 1mg  PO vitamin K.        * Supratherapeutic INR Acute blood loss anemia on iron deficiency anemia due to chronic vaginal blood loss INR 4.5-5.1 while here.  Given vitamin K, INR improved to 2.9 at discharge.  Warfarin dose reduced at d/c.  Has home INR kit and will check tomorrow and call Dr. Irven Shelling office for further adjustments.    For anemia, given IV iron here, discharged on oral iron.  Needs Hgb check in 7 days and iron stores check in 1 month      Diverticulitis of colon with bleeding Started on antibiotics on admission for "diverticulitis" (mild stranding seen on CT, questionable abdominal pain).  Treated for 3 days here, symptoms completely resolved, WBC normal.  Given interactions between Abx and warfarin, I think the risk of INR derangement outweighs the benefit of antibiotics at discharge.  Ambulatory dysfunction Unable to walk at baseline due to obesity.  Paroxysmal  atrial fibrillation On metoprolol and warfarin.  Chronic diastolic (congestive) heart failure (HCC) Seems at baseline, volume status not possible to estimate given habitus.  Morbid obesity due to excess calories BMI 87            [facility] Controlled Substances Registry was reviewed for this patient prior to discharge.  Consultants: None Procedures performed: CT abdomen  Disposition: Home Diet recommendation:  Discharge Diet Orders (From admission, onward)     Start     Ordered   11/09/22 0000  Diet - low sodium heart healthy        11/09/22 0941             DISCHARGE MEDICATION: Allergies as of 11/09/2022       Reactions   Penicillins Swelling   Has patient had a PCN reaction causing immediate rash, facial/tongue/throat swelling, SOB or lightheadedness with hypotension: Yes Has patient had a PCN reaction causing severe rash involving mucus membranes or skin necrosis: No Has patient had a PCN reaction that required hospitalization No Has patient had a PCN reaction occurring within the last 10 years: No If all of the above answers are "NO", then may proceed with Cephalosporin use.        Medication List     TAKE these medications    acetaminophen 500 MG tablet Commonly known as: TYLENOL Take 500 mg by mouth every 6 (six) hours as needed for moderate pain, mild pain, fever or headache.   docusate sodium 100 MG capsule  Commonly known as: Colace Take 1 capsule (100 mg total) by mouth daily.   ferrous sulfate 325 (65 FE) MG EC tablet Take 1 tablet (325 mg total) by mouth 2 (two) times daily.   metoprolol succinate 50 MG 24 hr tablet Commonly known as: TOPROL-XL Take 0.5 tablets (25 mg total) by mouth daily. What changed: how much to take   ONE A DAY WOMEN 50 PLUS PO Take 1 tablet by mouth daily.   torsemide 20 MG tablet Commonly known as: DEMADEX Take 20 mg by mouth 2 (two) times daily.   warfarin 2.5 MG tablet Commonly known as:  Coumadin Take as directed. If you are unsure how to take this medication, talk to your nurse or doctor. Original instructions: Take 1 tablet (2.5 mg total) by mouth daily. Take with 1 mg tab to equal 3.5 mg daily dose What changed:  medication strength how much to take when to take this additional instructions   warfarin 1 MG tablet Commonly known as: Coumadin Take as directed. If you are unsure how to take this medication, talk to your nurse or doctor. Original instructions: Take 1 tablet (1 mg total) by mouth daily. Take with 1 mg tab to equal 3.5 mg daily dose What changed: You were already taking a medication with the same name, and this prescription was added. Make sure you understand how and when to take each.        Follow-up Information     Alvester Chou, NP. Schedule an appointment as soon as possible for a visit in 1 week(s).   Specialty: Nurse Practitioner Contact information: [facility] [place] [address] [phone]         Darliss Cheney, MD. Call.   Specialty: Obstetrics and Gynecology Contact information: [place] [address] [phone]                 Discharge Instructions     Diet - low sodium heart healthy   Complete by: As directed    Discharge instructions   Complete by: As directed    **IMPORTANT DISCHARGE INSTRUCTIONS**   From Dr. Loleta Books: You were admitted for vaginal bleeding and elevated INR There may have been a very mild diverticulitis flare YOu were treated with antibiotics here for that and completed the course, I feel  The vaginal bleeding stopped, and your blood level is stable  You were given IV iron and you should continue oral iron supplements (ferrous sulfate) Take ferrous sulfate 325 mg once daily with a stool softener Docusate (because iron causes constipation) Have your doctor check your iron levels in 1 month   For your warfarin: REDUCE THE DOSE for  now DO NOT TAKE WARFARIN TONIGHT Resume Friday night 11/10/2022: Take Warfarin 3.5 mg daily  (1 mg tab with 2.5 mg tab daily) This is an adjustment of your dose  Have the home health agency check your INR in the next 3 days and fax to your INR clinic Dr. Einar Gip at fax [phone] (or phone [phone]) for adjustments  Please return to the emergency department for any worsening or worrisome symptoms.   Increase activity slowly   Complete by: As directed        Discharge Exam: Filed Weights   11/06/22 2005  Weight: (!) 209.3 kg    General: Pt is alert, awake, not in acute distress Cardiovascular: RRR, nl S1-S2, no murmurs appreciated.   No LE edema.   Respiratory:  Normal respiratory rate and rhythm.  CTAB without rales or wheezes. Abdominal: Abdomen soft and non-tender.  No distension or HSM.   Neuro/Psych: Strength symmetric in upper and lower extremities.  Judgment and insight appear normal.   Condition at discharge: good  The results of significant diagnostics from this hospitalization (including imaging, microbiology, ancillary and laboratory) are listed below for reference.   Imaging Studies: CT ABDOMEN PELVIS W CONTRAST  Result Date: 11/07/2022 CLINICAL DATA:  Left lower quadrant pain with rectal bleeding. EXAM: CT ABDOMEN AND PELVIS WITH CONTRAST TECHNIQUE: Multidetector CT imaging of the abdomen and pelvis was performed using the standard protocol following bolus administration of intravenous contrast. RADIATION DOSE REDUCTION: This exam was performed according to the departmental dose-optimization program which includes automated exposure control, adjustment of the mA and/or kV according to patient size and/or use of iterative reconstruction technique. CONTRAST:  112mL OMNIPAQUE IOHEXOL 350 MG/ML SOLN COMPARISON:  CTs with IV contrast 06/14/2022, 05/25/2022, 03/10/2022. FINDINGS: Technical note, image quality is limited due to abundant breathing motion artifact an  additional artifact from the patient's arms in the field impression body habitus. Lower chest: There is mild cardiomegaly. No pericardial effusion. No lung base infiltrate is seen through the breathing motion. Mild elevation right hemidiaphragm. Hepatobiliary: There is cholelithiasis with the largest rim calcified stone measuring 2.8 cm. No wall thickening or biliary dilatation is seen allowing for motion. The liver is mildly steatotic and no obvious mass is seen through the technical artifacts. Pancreas: No obvious abnormality is seen through the motion artifact. Spleen: No obvious abnormality is seen through the motion artifact. Adrenals/Urinary Tract: There is no adrenal mass. Cortical scarring is again seen posteriorly in the upper pole left kidney. There is a 1.7 cm cyst in the outer right kidney, unchanged, with Hounsfield density of 5.4. A 2.3 cm cyst is also noted in the superior pole of this kidney but not as well seen as previously due to motion. There is an additional 1.3 cm hypodensity posteriorly in upper to mid left kidney which previously measured fat density, today measures Hounsfield density of 6.3 but could be exaggerated due to motion. 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 There are small additional scattered too small to characterize cortical hypodensities. No follow-up imaging is recommended. There is symmetric renal excretion on the delayed images. No bladder thickening is seen. Stomach/Bowel: Unremarkable gastric wall and small bowel. No small bowel obstruction or inflammation. The appendix is normal caliber. There is diffuse colonic diverticulosis. Moderate constipation. There is mild stranding at the junction of the descending and sigmoid colon consistent with acute diverticulitis. No diverticular abscess is seen or free air. Sizable rectal fecal impaction is again noted without wall thickening. Vascular/Lymphatic: Aortic atherosclerosis. No enlarged abdominal or pelvic lymph nodes.  Reproductive: Uterus and bilateral adnexa are unremarkable. Other: Rectus diastasis again noted at and above the level of the umbilicus with outward protrusion and small umbilical fat hernia. There is no incarcerated hernia. There is no free air, free hemorrhage or free fluid. Musculoskeletal: There is osteopenia with degenerative changes thoracic and lumbar spine. There are chronic L5 pars defects with ankylosis of L5-S1 across the collapsed disc space and a grade 1 (borderline grade 2) spondylolisthesis with severe vertical foraminal stenosis. IMPRESSION: 1. Colonic diverticulosis with mild stranding at the junction of the descending and sigmoid colon consistent with acute diverticulitis. No diverticular abscess or free air. 2. Constipation and rectal fecal impaction. 3. Cholelithiasis without evidence of acute cholecystitis. 4. Aortic atherosclerosis. 5. Cardiomegaly. 6. Osteopenia  and degenerative change. 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 7. 1.3 cm hypodensity posteriorly in upper to mid left kidney which previously measured fat density, today measures Hounsfield density of 6.3 but could be exaggerated due to motion. Still favor a small angiomyolipoma. Aortic Atherosclerosis (ICD10-I70.0). Electronically Signed   By: Telford Nab M.D.   On: 11/07/2022 01:59   US PELVIS (TRANSABDOMINAL ONLY)  Result Date: 10/28/2022 CLINICAL DATA:  Postmenopausal bleeding EXAM: TRANSABDOMINAL ULTRASOUND OF PELVIS TECHNIQUE: Transabdominal ultrasound examination of the pelvis was performed including evaluation of the uterus, ovaries, adnexal regions, and pelvic cul-de-sac. COMPARISON:  None Available. FINDINGS: Uterus Measurements: 11 x 4.9 x 6.4 cm = volume: 179 mL. There is a mass in the region of the lower uterine segment. The mass may extend into the cervix as well. Endometrium Thickness: 20 mm.  No focal abnormality visualized. Ovaries: Neither ovary is  visualized. Other findings:  No abnormal free fluid. IMPRESSION: 1. The study is markedly limited due to patient body habitus. Additionally, the patient refused endovaginal imaging which further limits the study. 2. Evaluation of the endometrial stripe is limited. However, the best measurement on today's imaging is 20 mm which is abnormally thickened in a patient of this age. In the setting of post-menopausal bleeding, endometrial sampling is indicated to exclude carcinoma. If results are benign, sonohysterogram should be considered for focal lesion work-up. (Ref: Radiological Reasoning: Algorithmic Workup of Abnormal Vaginal Bleeding with Endovaginal Sonography and Sonohysterography. AJR 2008; [id number]) 3. There appears to be a mass measuring 4.1 x 4.2 x 5.0 cm involving the lower uterine segment, and possibly the cervix as well. Endovaginal imaging may better evaluate this mass. Electronically Signed   By: Dorise Bullion III M.D.   On: 10/28/2022 09:11    Microbiology: Results for orders placed or performed during the hospital encounter of 05/25/22  Urine Culture     Status: Abnormal   Collection Time: 05/25/22 11:41 PM   Specimen: Urine, Clean Catch  Result Value Ref Range Status   Specimen Description URINE, CLEAN CATCH  Final   Special Requests NONE  Final   Culture (A)  Final    <10,000 COLONIES/mL INSIGNIFICANT GROWTH Performed at [facility], [address] [place], [place] [address]    Report Status 05/26/2022 FINAL  Final    Labs: CBC: Recent Labs  Lab 11/06/22 2040 11/07/22 1938 11/08/22 0435 11/09/22 0425  WBC 11.1* 7.3 7.3 7.6  NEUTROABS 8.8*  --   --   --   HGB 9.2* 9.8* 8.1* 7.9*  HCT 32.0* 33.0* 28.0* 27.1*  MCV 72.1* 71.1* 72.2* 70.6*  PLT 336 355 329 [id number]   Basic Metabolic Panel: Recent Labs  Lab 11/06/22 2040 11/08/22 0435  NA 137 138  K 3.8 3.6  CL 100 104  CO2 27 27  GLUCOSE 194* 131*  BUN 23 13  CREATININE 0.72 0.65  CALCIUM 8.0* 8.1*    Liver Function Tests: Recent Labs  Lab 11/06/22 2040  AST 19  ALT 12  ALKPHOS 130*  BILITOT 0.3  PROT 8.0  ALBUMIN 2.3*   CBG: Recent Labs  Lab 11/08/22 1630 11/08/22 2035 11/09/22 0010 11/09/22 0503 11/09/22 0740  GLUCAP 136* 154* 165* 156* 130*    Discharge time spent: approximately 35 minutes spent on discharge counseling, evaluation of patient on day of discharge, and coordination of discharge planning with nursing, social work, pharmacy and case management  Signed: Edwin Dada, MD  [facility] 11/09/2022

## 2022-11-09 NOTE — Progress Notes (Signed)
ANTICOAGULATION CONSULT NOTE - follow-up  Pharmacy Consult for coumadin Indication: atrial fibrillation  Allergies  Allergen Reactions   Penicillins Swelling    Has patient had a PCN reaction causing immediate rash, facial/tongue/throat swelling, SOB or lightheadedness with hypotension: Yes Has patient had a PCN reaction causing severe rash involving mucus membranes or skin necrosis: No Has patient had a PCN reaction that required hospitalization No Has patient had a PCN reaction occurring within the last 10 years: No If all of the above answers are "NO", then may proceed with Cephalosporin use.     Patient Measurements: Height: 5\' 1"  (154.9 cm) Weight: (!) 209.3 kg (461 lb 6.4 oz) (bed scale) IBW/kg (Calculated) : 47.8 Heparin Dosing Weight:   Vital Signs: Temp: 97.8 F (36.6 C) (04/04 0458) Temp Source: Oral (04/04 0458) BP: 100/56 (04/04 0458) Pulse Rate: 73 (04/04 0458)  Labs: Recent Labs    11/06/22 2040 11/07/22 1938 11/08/22 0435 11/08/22 1522 11/08/22 1837 11/09/22 0425  HGB 9.2* 9.8* 8.1*  --   --  7.9*  HCT 32.0* 33.0* 28.0*  --   --  27.1*  PLT 336 355 329  --   --  331  LABPROT 42.4* 43.8* 46.7* <10.0* 38.2* 34.4*  INR 4.5* 4.7* 5.1* NOT CALCULATED 3.9* 3.4*  CREATININE 0.72  --  0.65  --   --   --      Estimated Creatinine Clearance: 117.8 mL/min (by C-G formula based on SCr of 0.65 mg/dL).   Medical History: Past Medical History:  Diagnosis Date   CHF (congestive heart failure)    Hypertension    Morbid obesity    Multiple sclerosis    Paroxysmal atrial fibrillation     Medications:  Medications Prior to Admission  Medication Sig Dispense Refill Last Dose   acetaminophen (TYLENOL) 500 MG tablet Take 500 mg by mouth every 6 (six) hours as needed for moderate pain, mild pain, fever or headache.   Past Week   metoprolol succinate (TOPROL-XL) 50 MG 24 hr tablet Take 0.5 tablets (25 mg total) by mouth daily. (Patient taking differently: Take 75  mg by mouth daily.)   11/06/2022 at 0830   Multiple Vitamins-Minerals (ONE A DAY WOMEN 50 PLUS PO) Take 1 tablet by mouth daily.   11/06/2022   torsemide (DEMADEX) 20 MG tablet Take 20 mg by mouth 2 (two) times daily.   11/06/2022 at am   warfarin (COUMADIN) 5 MG tablet Take 2.5-5 mg by mouth See admin instructions. Take 1 tablet by mouth daily, except on Monday, Wednesday, Friday take 1/2 tablet   11/06/2022 at 0830   Scheduled:   ciprofloxacin  500 mg Oral BID   insulin aspart  0-20 Units Subcutaneous TID WC   insulin aspart  0-5 Units Subcutaneous QHS   metoprolol succinate  25 mg Oral Daily   metroNIDAZOLE  500 mg Oral Q12H   torsemide  20 mg Oral BID   Infusions:   ferric gluconate (FERRLECIT) IVPB Stopped (11/08/22 1155)   lactated ringers 100 mL/hr at 11/09/22 0604    Assessment: Pt has been on coumadin for afib. Her admission INR was 4.5 and she was admitted for vaginal and rectal bleeding. Note mentioned that vitamin k was given x1 in the ED but no dose was charted. Rx consulted to continue coumadin.    PTA coumadin - 5mg  qday except 2.5mg  MWF  11/09/2022 AM update: INR 3.4 Hgb 7.9 Vitamin K 1 mg PO administered 4/3 PM Bleeding is unchanged from 4/3  Goal of Therapy:  INR 2-3 Monitor platelets by anticoagulation protocol: Yes   Plan:  No coumadin today INR 1400 Daily INR    BS, PharmD, BCPS Clinical Pharmacist 11/09/2022 6:57 AM  Contact: [phone] after 3 PM  "Be curious, not judgmental..." -Jamal Maes

## 2022-11-14 ENCOUNTER — Other Ambulatory Visit: Payer: Self-pay

## 2022-11-14 ENCOUNTER — Ambulatory Visit (INDEPENDENT_AMBULATORY_CARE_PROVIDER_SITE_OTHER): Payer: 59 | Admitting: Obstetrics and Gynecology

## 2022-11-14 ENCOUNTER — Encounter: Payer: Self-pay | Admitting: Obstetrics and Gynecology

## 2022-11-14 VITALS — BP 109/77 | HR 73

## 2022-11-14 DIAGNOSIS — N950 Postmenopausal bleeding: Secondary | ICD-10-CM | POA: Diagnosis not present

## 2022-11-14 NOTE — Progress Notes (Signed)
OB/GYN Pre-Op History and Physical  Zoe Cobb is a 67 y.o. No obstetric history on file. presenting for PMB.       Past Medical History:  Diagnosis Date   CHF (congestive heart failure)    Hypertension    Morbid obesity    Multiple sclerosis    Paroxysmal atrial fibrillation     Past Surgical History:  Procedure Laterality Date   COLONOSCOPY WITH PROPOFOL N/A 12/15/2020   Procedure: COLONOSCOPY WITH PROPOFOL;  Surgeon: Meryl DareStark, Malcolm T, MD;  [facility];  Service: Endoscopy;  Laterality: N/A;    OB History  No obstetric history on file.    Social History   Socioeconomic History   Marital status: Married    Spouse name: Not on file   Number of children: 2   Years of education: Not on file   Highest education level: Not on file  Occupational History   Not on file  Tobacco Use   Smoking status: Former    Packs/day: 1.00    Years: 9.00    Additional pack years: 0.00    Total pack years: 9.00    Types: Cigarettes    Quit date: 471996    Years since quitting: 28.2   Smokeless tobacco: Never  Vaping Use   Vaping Use: Never used  Substance and Sexual Activity   Alcohol use: No    Alcohol/week: 0.0 standard drinks of alcohol   Drug use: No   Sexual activity: Not on file  Other Topics Concern   Not on file  Social History Narrative   Pt lives by herself, she completed hs.    Social Determinants of Health   Financial Resource Strain: Not on file  Food Insecurity: No Food Insecurity (11/14/2022)   Hunger Vital Sign    Worried About Running Out of Food in the Last Year: Never true    Ran Out of Food in the Last Year: Never true  Transportation Needs: No Transportation Needs (11/14/2022)   PRAPARE - Administrator, Civil ServiceTransportation    Lack of Transportation (Medical): No    Lack of Transportation (Non-Medical): No  Physical Activity: Not on file  Stress: Not on file  Social Connections: Not on file    Family History  Problem Relation Age of Onset    Hypertension Mother    Diabetes Mother    Stomach cancer Father    Kidney disease Sister    Diabetes Sister    Diabetes Sister    Diabetes Sister    Heart attack Brother    HIV/AIDS Brother    Diabetes Brother     (Not in a hospital admission)   Allergies  Allergen Reactions   Penicillins Swelling    Has patient had a PCN reaction causing immediate rash, facial/tongue/throat swelling, SOB or lightheadedness with hypotension: Yes Has patient had a PCN reaction causing severe rash involving mucus membranes or skin necrosis: No Has patient had a PCN reaction that required hospitalization No Has patient had a PCN reaction occurring within the last 10 years: No If all of the above answers are "NO", then may proceed with Cephalosporin use.     Review of Systems: Negative except for what is mentioned in HPI.     Physical Exam: BP 109/77   Pulse 73  CONSTITUTIONAL: Well-developed, well-nourished female in no acute distress.  HENT:  Normocephalic, atraumatic, External right and left ear normal. Oropharynx is clear and moist EYES: Conjunctivae and EOM are normal. Pupils are equal, round, and reactive to light.  No scleral icterus.  NECK: Normal range of motion, supple, no masses SKIN: Skin is warm and dry. No rash noted. Not diaphoretic. No erythema. No pallor. NEUROLGIC: Alert and oriented to person, place, and time. Normal reflexes, muscle tone coordination. No cranial nerve deficit noted. PSYCHIATRIC: Normal mood and affect. Normal behavior. Normal judgment and thought content. RESPIRATORY: normal effort PELVIC: Deferred   Pertinent Labs/Studies:   No results found for this or any previous visit (from the past 72 hour(s)).     Assessment and Plan :Zoe Cobb is a 67 y.o. with PMB plan for hysteroscopy, D&C with IUD isnertion    Plan for hysteroscopy, D&C with IUD insertion Will send message to cards clinic regarding clearance and anti-coagulation plan   The risks  of surgery were discussed in detail with the patient including but not limited to: bleeding which may require transfusion or reoperation; infection which may require prolonged hospitalization or re-hospitalization and antibiotic therapy; injury to bowel, bladder, ureters and major vessels or other surrounding organs which may lead to other procedures; formation of adhesions; need for additional procedures including laparotomy or subsequent procedures secondary to intraoperative injury or abnormal pathology; thromboembolic phenomenon; incisional problems and other postoperative or anesthesia complications.  Patient was told that the likelihood that her condition and symptoms will be treated effectively with this surgical management was very high; the postoperative expectations were also discussed in detail. The patient also understands the alternative treatment options which were discussed in full. All questions were answered.  She was told that she will be contacted by our surgical scheduler regarding the time and date of her surgery; routine preoperative instructions will be given to her by the preoperative nursing team.  Printed patient education handouts about the procedure were given to the patient to review at home.    Lorriane Shire, M.D. Minimally Invasive Gynecologic Surgery and Pelvic Pain Specialist Attending Obstetrician & Gynecologist, [facility] [employer], [facility]

## 2022-11-15 ENCOUNTER — Ambulatory Visit: Payer: 59 | Admitting: Cardiology

## 2022-11-15 DIAGNOSIS — I480 Paroxysmal atrial fibrillation: Secondary | ICD-10-CM

## 2022-11-15 LAB — POCT INR: POC INR: 2.9

## 2022-11-15 NOTE — Progress Notes (Signed)
Description   11/15/2022  INR today was 2.9. Patient goal level is (2.0-2.5)  Prior dose was 2.5 mg Monday, Wednesday, Friday, and 5 mg all the other days.  patient will continue with this dosage plan.   Pt will recheck INR 4 weeks on 12/13/2022.  Lab Results      Component                Value               Date                      INR                      2.9                 11/15/2022                INR                      2.9 (H)             11/09/2022                INR                      3.4 (H)             11/09/2022            Paroxysmal atrial fibrillation  (primary encounter diagnosis) Plan: POCT INR

## 2022-11-17 ENCOUNTER — Telehealth: Payer: Self-pay | Admitting: Obstetrics and Gynecology

## 2022-11-17 NOTE — Telephone Encounter (Signed)
   Request for surgical clearance:  What type of surgery is being performed? Hysteroscopy, D&C and IUD insertion   When is this surgery scheduled? May 1   Are there any medications that need to be held prior to surgery and how long? Coumadin  Name of physician performing surgery? Lorriane Shire, MD    What is the office phone and fax number? Main: [phone]  Fax: [phone]   ____________________________________________________   For Zoe Berthold, MD

## 2022-11-17 NOTE — Telephone Encounter (Signed)
Pre-op Team:  This patient's primary cardiologist is Dr. Jacinto Halim. Please notify requesting office that recommendations for cardiovascular risk will need to come from Dr. Verl Dicker office.   Thank you!  Etta Grandchild. , DNP, NP-C  11/17/2022, 3:29 PM [facility] [address] Office [phone] Fax [phone]

## 2022-11-17 NOTE — Telephone Encounter (Signed)
Dr Verl Dicker office is also managing pt's warfarin, recommend anticoag recs come from his office as well.

## 2022-11-23 ENCOUNTER — Encounter: Payer: Self-pay | Admitting: Cardiology

## 2022-11-29 ENCOUNTER — Telehealth: Payer: Self-pay

## 2022-11-29 NOTE — Telephone Encounter (Addendum)
-----   Message from Lorriane Shire, MD sent at 11/24/2022  2:16 PM EDT ----- Regarding: hold anticoagulation Make sure patient knows to hold warfarin for 5 d prior to procedure  ----- Message ----- From: Yates Decamp, MD Sent: 11/23/2022   8:18 PM EDT To: Lorriane Shire, MD  Called pt and pt informed me that she remembers to stop her warfarin on 5 days before her procedure on 12/15/22.  Pt did not have any other questions.   Leonette Nutting

## 2022-12-04 ENCOUNTER — Encounter (HOSPITAL_COMMUNITY): Payer: Self-pay | Admitting: Obstetrics and Gynecology

## 2022-12-05 ENCOUNTER — Telehealth: Payer: Self-pay | Admitting: Obstetrics and Gynecology

## 2022-12-05 ENCOUNTER — Encounter (HOSPITAL_COMMUNITY): Payer: Self-pay | Admitting: Physician Assistant

## 2022-12-05 NOTE — Progress Notes (Signed)
Spoke with patient for SDW  regarding surgery 12/06/2022. Patient states she has a driver set up for 6/96/2952 and cannot have surgery 12/06/2022.  States she was told by Dr. Odie Sera office her surgery was 12/15/2022.  Spoke with Erie Noe at Dr. Odie Sera office to clarify surgery date and to advise to call patient re: re-scheduling surgery.

## 2022-12-05 NOTE — Telephone Encounter (Signed)
A nurse from the surgery center called, requesting someone contact this patient.    She is scheduled for tomorrow 12/06/2022 for her surgery, patient state that all her information she got from here said her surgery was 12/15/2022, she said the patient state there is no way that she can get her surgery done tomorrow.

## 2022-12-06 ENCOUNTER — Ambulatory Visit (HOSPITAL_COMMUNITY): Admission: RE | Admit: 2022-12-06 | Payer: 59 | Source: Ambulatory Visit | Admitting: Obstetrics and Gynecology

## 2022-12-06 HISTORY — DX: Cardiac arrhythmia, unspecified: I49.9

## 2022-12-06 SURGERY — DILATATION AND CURETTAGE /HYSTEROSCOPY
Anesthesia: Choice

## 2022-12-15 ENCOUNTER — Ambulatory Visit: Payer: 59 | Admitting: Cardiology

## 2022-12-15 DIAGNOSIS — I480 Paroxysmal atrial fibrillation: Secondary | ICD-10-CM

## 2022-12-15 LAB — POCT INR: INR: 2.9 (ref 2.0–3.0)

## 2022-12-15 NOTE — Progress Notes (Signed)
Description   11/15/2022  INR today was 2.9. Patient goal level is (2.0-2.5)  Prior dose was 2.5 mg Monday, Wednesday, Friday, and 5 mg all the other days.  Patient will now add 2.5mg  on Sun, and continue Mon, Wed, and Fri, and 5mg  all other days.   Pt will recheck INR 4 weeks on Lab Results       Component                Value               Date                      INR                      2.9                 11/15/2022                INR                      2.9 (H)             11/09/2022                INR                      3.4 (H)             11/09/2022            Paroxysmal atrial fibrillation  (primary encounter diagnosis) Plan: POCT INR  Next check 01/12/2023 10:00 A

## 2022-12-30 ENCOUNTER — Other Ambulatory Visit (HOSPITAL_COMMUNITY): Payer: Self-pay

## 2022-12-30 ENCOUNTER — Emergency Department (HOSPITAL_COMMUNITY)
Admission: EM | Admit: 2022-12-30 | Discharge: 2022-12-30 | Disposition: A | Discharge status: Home / Self Care | Payer: 59 | Attending: Emergency Medicine | Admitting: Emergency Medicine

## 2022-12-30 ENCOUNTER — Emergency Department (HOSPITAL_COMMUNITY): Payer: 59

## 2022-12-30 ENCOUNTER — Other Ambulatory Visit: Payer: Self-pay

## 2022-12-30 DIAGNOSIS — I110 Hypertensive heart disease with heart failure: Secondary | ICD-10-CM | POA: Diagnosis not present

## 2022-12-30 DIAGNOSIS — K5732 Diverticulitis of large intestine without perforation or abscess without bleeding: Secondary | ICD-10-CM | POA: Diagnosis not present

## 2022-12-30 DIAGNOSIS — Z7901 Long term (current) use of anticoagulants: Secondary | ICD-10-CM | POA: Diagnosis not present

## 2022-12-30 DIAGNOSIS — K5792 Diverticulitis of intestine, part unspecified, without perforation or abscess without bleeding: Secondary | ICD-10-CM

## 2022-12-30 DIAGNOSIS — K922 Gastrointestinal hemorrhage, unspecified: Secondary | ICD-10-CM | POA: Diagnosis present

## 2022-12-30 DIAGNOSIS — I509 Heart failure, unspecified: Secondary | ICD-10-CM | POA: Insufficient documentation

## 2022-12-30 DIAGNOSIS — Z79899 Other long term (current) drug therapy: Secondary | ICD-10-CM | POA: Diagnosis not present

## 2022-12-30 LAB — COMPREHENSIVE METABOLIC PANEL
ALT: 9 U/L (ref 0–44)
AST: 15 U/L (ref 15–41)
Albumin: 2.3 g/dL — ABNORMAL LOW (ref 3.5–5.0)
Alkaline Phosphatase: 109 U/L (ref 38–126)
Anion gap: 8 (ref 5–15)
BUN: 22 mg/dL (ref 8–23)
CO2: 27 mmol/L (ref 22–32)
Calcium: 8.1 mg/dL — ABNORMAL LOW (ref 8.9–10.3)
Chloride: 103 mmol/L (ref 98–111)
Creatinine, Ser: 0.71 mg/dL (ref 0.44–1.00)
GFR, Estimated: >60 mL/min (ref >60)
Glucose, Bld: 120 mg/dL — ABNORMAL HIGH (ref 70–99)
Potassium: 3.6 mmol/L (ref 3.5–5.1)
Sodium: 138 mmol/L (ref 135–145)
Total Bilirubin: 0.4 mg/dL (ref 0.3–1.2)
Total Protein: 7.7 g/dL (ref 6.5–8.1)

## 2022-12-30 LAB — CBC WITH DIFFERENTIAL/PLATELET
Abs Immature Granulocytes: 0.01 10*3/uL (ref 0.00–0.07)
Basophils Absolute: 0 10*3/uL (ref 0.0–0.1)
Basophils Relative: 0 %
Eosinophils Absolute: 0.3 10*3/uL (ref 0.0–0.5)
Eosinophils Relative: 4 %
HCT: 29.2 % — ABNORMAL LOW (ref 36.0–46.0)
Hemoglobin: 8.3 g/dL — ABNORMAL LOW (ref 12.0–15.0)
Immature Granulocytes: 0 %
Lymphocytes Relative: 20 %
Lymphs Abs: 1.4 10*3/uL (ref 0.7–4.0)
MCH: 20.4 pg — ABNORMAL LOW (ref 26.0–34.0)
MCHC: 28.4 g/dL — ABNORMAL LOW (ref 30.0–36.0)
MCV: 71.9 fL — ABNORMAL LOW (ref 80.0–100.0)
Monocytes Absolute: 0.4 10*3/uL (ref 0.1–1.0)
Monocytes Relative: 6 %
Neutro Abs: 4.8 10*3/uL (ref 1.7–7.7)
Neutrophils Relative %: 70 %
Platelets: 398 10*3/uL (ref 150–400)
RBC: 4.06 MIL/uL (ref 3.87–5.11)
RDW: 19 % — ABNORMAL HIGH (ref 11.5–15.5)
WBC: 7 10*3/uL (ref 4.0–10.5)
nRBC: 0 % (ref 0.0–0.2)

## 2022-12-30 LAB — LIPASE, BLOOD: Lipase: 32 U/L (ref 11–51)

## 2022-12-30 LAB — PROTIME-INR
INR: 2.7 — ABNORMAL HIGH (ref 0.8–1.2)
Prothrombin Time: 28.6 seconds — ABNORMAL HIGH (ref 11.4–15.2)

## 2022-12-30 LAB — OCCULT BLOOD X 1 CARD TO LAB, STOOL: Fecal Occult Bld: POSITIVE — AB

## 2022-12-30 MED ORDER — SODIUM CHLORIDE 0.9 % IV BOLUS
1000.0000 mL | Freq: Once | INTRAVENOUS | Status: AC
  Administered 2022-12-30: 1000 mL via INTRAVENOUS

## 2022-12-30 MED ORDER — CIPROFLOXACIN HCL 500 MG PO TABS
500.0000 mg | ORAL_TABLET | Freq: Two times a day (BID) | ORAL | 0 refills | Status: AC
  Filled 2022-12-30: qty 14, 7d supply, fill #0

## 2022-12-30 MED ORDER — IOHEXOL 300 MG/ML  SOLN
100.0000 mL | Freq: Once | INTRAMUSCULAR | Status: AC | PRN
  Administered 2022-12-30: 100 mL via INTRAVENOUS

## 2022-12-30 MED ORDER — METRONIDAZOLE 500 MG PO TABS: 500.0000 mg | ORAL_TABLET | Freq: Three times a day (TID) | ORAL | 0 refills | Status: DC

## 2022-12-30 MED ORDER — CIPROFLOXACIN HCL 500 MG PO TABS: 500.0000 mg | ORAL_TABLET | Freq: Two times a day (BID) | ORAL | 0 refills | Status: DC

## 2022-12-30 MED ORDER — MORPHINE SULFATE (PF) 4 MG/ML IV SOLN
4.0000 mg | Freq: Once | INTRAVENOUS | Status: DC
  Filled 2022-12-30: qty 1

## 2022-12-30 MED ORDER — METRONIDAZOLE 500 MG PO TABS
500.0000 mg | ORAL_TABLET | Freq: Three times a day (TID) | ORAL | 0 refills | Status: AC
  Filled 2022-12-30: qty 21, 7d supply, fill #0

## 2022-12-30 NOTE — Care Management (Signed)
Patient pharmacy wont open until Tuesday she uses [facility] which deliver medications as she has transportation issues. Messaged with provider, [facility] pharmacy is open until 2pm, and [facility] outpatient open until 4:30.  OP/TOC pharmacy will deliver to [facility] ED for patient.

## 2022-12-30 NOTE — ED Notes (Signed)
I asked patient for a urine sample. She stated she can not urinate at this time

## 2022-12-30 NOTE — ED Notes (Signed)
Pt left ED in stable condition with EMS via stretcher

## 2022-12-30 NOTE — ED Triage Notes (Signed)
EMS reports from home, called out for dark red blood in stool since yesterday morning. Pt states she takes warfarin. Also hx of diverticulitis and has had several bouts of same for past couple months.  BP 117/94 HR 64 RR 18 Sp02 100 RM CBG 139  20ga R hand.

## 2022-12-30 NOTE — Discharge Instructions (Addendum)
Please take your medications as prescribed. Take tylenol/ibuprofen for pain. I recommend close follow-up with PCP for reevaluation.  Please do not hesitate to return to emergency department if worrisome signs symptoms we discussed become apparent.  

## 2022-12-30 NOTE — ED Provider Notes (Signed)
[facility] EMERGENCY DEPARTMENT AT [facility] Provider Note   CSN: [id number] Arrival date & time: 12/30/22  0935     History  Chief Complaint  Patient presents with   Abdominal Pain   Rectal Bleeding    Zoe Cobb is a 67 y.o. female with a past medical history of A-fib on warfarin, hypertension, CHF presents today for evaluation of GI bleed.  Patient reports she noticed blood clots and dark red stool yesterday.  States she has mild rectal pain during bowel movement.  Denies history of hemorrhoids.  Stated she has had the same issue before.  She reports lower abdominal pain and cramping.  Denies any urinary symptoms.  Denies seeing any blood in the urine.  Denies any fever, nausea, vomiting, chest pain or shortness of breath.  Denies any history of abdominal surgery.   Abdominal Pain Associated symptoms: hematochezia   Rectal Bleeding Associated symptoms: abdominal pain       Past Medical History:  Diagnosis Date   CHF (congestive heart failure) (HCC)    Dysrhythmia    A-fib   Hypertension    Morbid obesity (HCC)    Multiple sclerosis (HCC)    Paroxysmal atrial fibrillation (HCC)    Past Surgical History:  Procedure Laterality Date   COLONOSCOPY WITH PROPOFOL N/A 12/15/2020   Procedure: COLONOSCOPY WITH PROPOFOL;  Surgeon: Meryl Dare, MD;  Location: [facility] ENDOSCOPY;  Service: Endoscopy;  Laterality: N/A;     Home Medications Prior to Admission medications   Medication Sig Start Date End Date Taking? Authorizing Provider  acetaminophen (TYLENOL) 500 MG tablet Take 500 mg by mouth every 6 (six) hours as needed for moderate pain, mild pain, fever or headache.    [provider]  docusate sodium (COLACE) 100 MG capsule Take 1 capsule (100 mg total) by mouth daily. 11/09/22 11/09/23  Danford, Earl Lites, MD  ferrous sulfate 325 (65 FE) MG EC tablet Take 1 tablet (325 mg total) by mouth 2 (two) times daily. 11/09/22 11/09/23  Alberteen Sam,  MD  isosorbide mononitrate (IMDUR) 60 MG 24 hr tablet Take 60 mg by mouth daily. 11/10/22   [provider]  metoprolol succinate (TOPROL-XL) 50 MG 24 hr tablet Take 50 mg by mouth daily. Take with or immediately following a meal.    [provider]  Multiple Vitamins-Minerals (ONE A DAY WOMEN 50 PLUS PO) Take 1 tablet by mouth daily.    [provider]  torsemide (DEMADEX) 20 MG tablet Take 20 mg by mouth 2 (two) times daily. 09/29/22   [provider]  warfarin (COUMADIN) 1 MG tablet Take 1 tablet (1 mg total) by mouth daily. Take with 1 mg tab to equal 3.5 mg daily dose 11/09/22 11/09/23  Danford, Earl Lites, MD  warfarin (COUMADIN) 2.5 MG tablet Take 1 tablet (2.5 mg total) by mouth daily. Take with 1 mg tab to equal 3.5 mg daily dose 11/09/22 11/09/23  Danford, Earl Lites, MD      Allergies    Penicillins    Review of Systems   Review of Systems  Gastrointestinal:  Positive for abdominal pain and hematochezia.    Physical Exam Updated Vital Signs BP 98/69   Pulse 70   Temp 98.4 F (36.9 C)   Resp 18   SpO2 98%  Physical Exam Vitals and nursing note reviewed.  Constitutional:      Appearance: Normal appearance.  HENT:     Head: Normocephalic and atraumatic.  Mouth/Throat:     Mouth: Mucous membranes are moist.  Eyes:     General: No scleral icterus. Cardiovascular:     Rate and Rhythm: Normal rate and regular rhythm.     Pulses: Normal pulses.     Heart sounds: Normal heart sounds.  Pulmonary:     Effort: Pulmonary effort is normal.     Breath sounds: Normal breath sounds.  Abdominal:     General: Abdomen is flat.     Palpations: Abdomen is soft.     Tenderness: There is abdominal tenderness in the right lower quadrant, periumbilical area and left lower quadrant.  Genitourinary:    Comments: Rectal exam done with RN present throughout examination.  No signs of hemorrhoids.  Dark stools noted. Musculoskeletal:        General: No  deformity.  Skin:    General: Skin is warm.     Findings: No rash.  Neurological:     General: No focal deficit present.     Mental Status: She is alert.  Psychiatric:        Mood and Affect: Mood normal.     ED Results / Procedures / Treatments   Labs (all labs ordered are listed, but only abnormal results are displayed) Labs Reviewed  COMPREHENSIVE METABOLIC PANEL  CBC WITH DIFFERENTIAL/PLATELET  PROTIME-INR  LIPASE, BLOOD  URINALYSIS, ROUTINE W REFLEX MICROSCOPIC  POC OCCULT BLOOD, ED    EKG None  Radiology No results found.  Procedures Procedures    Medications Ordered in ED Medications  morphine (PF) 4 MG/ML injection 4 mg (has no administration in time range)  sodium chloride 0.9 % bolus 1,000 mL (has no administration in time range)    ED Course/ Medical Decision Making/ A&P                             Medical Decision Making Amount and/or Complexity of Data Reviewed Labs: ordered. Radiology: ordered.  Risk Prescription drug management.   This patient presents to the ED for GI bleed, abdominal pain, this involves an extensive number of treatment options, and is a complaint that carries with a high risk of complications and morbidity.  The differential diagnosis includes gastroenteritis, SBO, appendicitis, diverticulitis, tuberculosis, kidney stones, pyelonephritis, cystitis, infectious etiology.  This is not an exhaustive list.  Lab tests: I ordered and personally interpreted labs.  The pertinent results include: WBC unremarkable. Hbg 8.3, last hemoglobin 7.9. Platelets unremarkable. Electrolytes unremarkable. BUN, creatinine unremarkable.  INR 2.7.  Fecal Hemoccult positive.  Imaging studies: I ordered imaging studies. I personally reviewed, interpreted imaging and agree with the radiologist's interpretations. The results include: CT abdomen pelvis showed acute diverticulitis.  Problem list/ ED course/ Critical interventions/ Medical  management: HPI: See above Vital signs within normal range and stable throughout visit. Laboratory/imaging studies significant for: See above. On physical examination, patient is afebrile and appears in no acute distress.  Rectal exam does not show signs of hemorrhoids, dark stool noted.  Hemoccult positive.  CBC with hemoglobin of 8.3, last hemoglobin was 7.9.  CT scan show acute diverticulitis, no perforations or abscess.  Patient is normotensive with blood pressure of 118/68.  INR of 2.7 which is slightly above therapeutic goal of 2 - 2.5.  Patient has a history of A-fib and has been follow-up with cardiology for monitoring of A-fib as well as her anticoagulant use.  Patient is hemodynamically stable.  Given morphine for pain.  Reevaluation of  patient after this medication showed that the patient improved.  I will send an Rx of Cipro and Flagyl to treat diverticulitis.  Advised patient to follow-up with gastroenterology for further evaluation management.  Strict return precaution discussed. I have reviewed the patient home medicines and have made adjustments as needed.  Cardiac monitoring/EKG: The patient was maintained on a cardiac monitor.  I personally reviewed and interpreted the cardiac monitor which showed an underlying rhythm of: sinus rhythm.  Additional history obtained: External records from outside source obtained and reviewed including: Chart review including previous notes, labs, imaging.  Consultations obtained:  Disposition Continued outpatient therapy. Follow-up with PCP and gastroenterology recommended for reevaluation of symptoms. Treatment plan discussed with patient.  Pt acknowledged understanding was agreeable to the plan. Worrisome signs and symptoms were discussed with patient, and patient acknowledged understanding to return to the ED if they noticed these signs and symptoms. Patient was stable upon discharge.   This chart was dictated using voice recognition software.   Despite best efforts to proofread,  errors can occur which can change the documentation meaning.          Final Clinical Impression(s) / ED Diagnoses Final diagnoses:  Diverticulitis    Rx / DC Orders ED Discharge Orders     None         Jeanelle Malling, [place] 12/30/22 1955    Rozelle Logan, DO 12/31/22 0715

## 2023-01-12 ENCOUNTER — Ambulatory Visit: Payer: 59

## 2023-01-12 DIAGNOSIS — I480 Paroxysmal atrial fibrillation: Secondary | ICD-10-CM

## 2023-01-12 LAB — POCT INR: INR: 3 (ref 2.0–3.0)

## 2023-01-15 ENCOUNTER — Telehealth: Payer: Self-pay

## 2023-01-15 NOTE — Telephone Encounter (Signed)
Contacted patient to reschedule her procedure w/ Dr. Briscoe Deutscher. Patient verified HIPAA and confirmed she was ready to reschedule. Patient would like to have surgery at the end of June. Verified that there was no OR time available in June at [facility]. Patient will wait until August schedule is available.

## 2023-02-12 ENCOUNTER — Ambulatory Visit: Payer: 59 | Admitting: Cardiology

## 2023-02-12 DIAGNOSIS — Z5181 Encounter for therapeutic drug level monitoring: Secondary | ICD-10-CM

## 2023-02-12 DIAGNOSIS — I480 Paroxysmal atrial fibrillation: Secondary | ICD-10-CM

## 2023-02-12 LAB — POCT INR: INR: 3 (ref 2.0–3.0)

## 2023-02-12 NOTE — Progress Notes (Signed)
Description   02/12/2023  INR today was 3.0 Patient goal level is (2.0-2.5)  Prior dose was 4mg  on Thurs and 2.5 all other days.    Patient will recheck INR in 1 month on  03/15/23  Lab Results      Component                Value               Date                      INR                      3.0                 02/12/2023                INR                      3.0                 01/12/2023                INR                      2.7 (H)             12/30/2022            Paroxysmal atrial fibrillation  (primary encounter diagnosis) Plan: POCT INR

## 2023-02-19 ENCOUNTER — Other Ambulatory Visit: Payer: Self-pay | Admitting: Adult Health

## 2023-02-19 DIAGNOSIS — Z139 Encounter for screening, unspecified: Secondary | ICD-10-CM

## 2023-03-01 ENCOUNTER — Observation Stay (HOSPITAL_COMMUNITY)
Admission: EM | Admit: 2023-03-01 | Discharge: 2023-03-03 | Disposition: A | Discharge status: Home Health | Payer: 59 | Attending: Internal Medicine | Admitting: Internal Medicine

## 2023-03-01 ENCOUNTER — Other Ambulatory Visit: Payer: Self-pay

## 2023-03-01 ENCOUNTER — Encounter (HOSPITAL_COMMUNITY): Payer: Self-pay

## 2023-03-01 ENCOUNTER — Emergency Department (HOSPITAL_COMMUNITY): Payer: 59

## 2023-03-01 DIAGNOSIS — D62 Acute posthemorrhagic anemia: Secondary | ICD-10-CM | POA: Diagnosis not present

## 2023-03-01 DIAGNOSIS — Z79899 Other long term (current) drug therapy: Secondary | ICD-10-CM | POA: Diagnosis not present

## 2023-03-01 DIAGNOSIS — Z7901 Long term (current) use of anticoagulants: Secondary | ICD-10-CM | POA: Diagnosis not present

## 2023-03-01 DIAGNOSIS — N950 Postmenopausal bleeding: Principal | ICD-10-CM | POA: Insufficient documentation

## 2023-03-01 DIAGNOSIS — I110 Hypertensive heart disease with heart failure: Secondary | ICD-10-CM | POA: Insufficient documentation

## 2023-03-01 DIAGNOSIS — I10 Essential (primary) hypertension: Secondary | ICD-10-CM | POA: Diagnosis present

## 2023-03-01 DIAGNOSIS — R791 Abnormal coagulation profile: Secondary | ICD-10-CM | POA: Insufficient documentation

## 2023-03-01 DIAGNOSIS — I5032 Chronic diastolic (congestive) heart failure: Secondary | ICD-10-CM | POA: Diagnosis not present

## 2023-03-01 DIAGNOSIS — D259 Leiomyoma of uterus, unspecified: Secondary | ICD-10-CM | POA: Diagnosis not present

## 2023-03-01 DIAGNOSIS — I480 Paroxysmal atrial fibrillation: Secondary | ICD-10-CM | POA: Diagnosis not present

## 2023-03-01 DIAGNOSIS — N939 Abnormal uterine and vaginal bleeding, unspecified: Secondary | ICD-10-CM | POA: Diagnosis present

## 2023-03-01 LAB — BASIC METABOLIC PANEL
Anion gap: 12 (ref 5–15)
BUN: 16 mg/dL (ref 8–23)
CO2: 24 mmol/L (ref 22–32)
Calcium: 8.2 mg/dL — ABNORMAL LOW (ref 8.9–10.3)
Chloride: 101 mmol/L (ref 98–111)
Creatinine, Ser: 0.62 mg/dL (ref 0.44–1.00)
GFR, Estimated: >60 mL/min (ref >60)
Glucose, Bld: 105 mg/dL — ABNORMAL HIGH (ref 70–99)
Potassium: 3.8 mmol/L (ref 3.5–5.1)
Sodium: 137 mmol/L (ref 135–145)

## 2023-03-01 LAB — CBC WITH DIFFERENTIAL/PLATELET
Abs Immature Granulocytes: 0.03 10*3/uL (ref 0.00–0.07)
Basophils Absolute: 0 10*3/uL (ref 0.0–0.1)
Basophils Relative: 1 %
Eosinophils Absolute: 0.3 10*3/uL (ref 0.0–0.5)
Eosinophils Relative: 4 %
HCT: 27.5 % — ABNORMAL LOW (ref 36.0–46.0)
Hemoglobin: 7.7 g/dL — ABNORMAL LOW (ref 12.0–15.0)
Immature Granulocytes: 0 %
Lymphocytes Relative: 14 %
Lymphs Abs: 1.2 10*3/uL (ref 0.7–4.0)
MCH: 19.7 pg — ABNORMAL LOW (ref 26.0–34.0)
MCHC: 28 g/dL — ABNORMAL LOW (ref 30.0–36.0)
MCV: 70.5 fL — ABNORMAL LOW (ref 80.0–100.0)
Monocytes Absolute: 0.7 10*3/uL (ref 0.1–1.0)
Monocytes Relative: 7 %
Neutro Abs: 6.5 10*3/uL (ref 1.7–7.7)
Neutrophils Relative %: 74 %
Platelets: 379 10*3/uL (ref 150–400)
RBC: 3.9 MIL/uL (ref 3.87–5.11)
RDW: 20.2 % — ABNORMAL HIGH (ref 11.5–15.5)
WBC: 8.8 10*3/uL (ref 4.0–10.5)
nRBC: 0 % (ref 0.0–0.2)

## 2023-03-01 LAB — TYPE AND SCREEN
ABO/RH(D): A POS
Antibody Screen: NEGATIVE
Unit division: 0

## 2023-03-01 LAB — HEMOGLOBIN AND HEMATOCRIT, BLOOD
HCT: 28.6 % — ABNORMAL LOW (ref 36.0–46.0)
Hemoglobin: 8 g/dL — ABNORMAL LOW (ref 12.0–15.0)

## 2023-03-01 LAB — BPAM RBC
Blood Product Expiration Date: 202408162359
ISSUE DATE / TIME: 202407251710
Unit Type and Rh: 6200

## 2023-03-01 LAB — PROTIME-INR
INR: 3.6 — ABNORMAL HIGH (ref 0.8–1.2)
Prothrombin Time: 35.8 seconds — ABNORMAL HIGH (ref 11.4–15.2)

## 2023-03-01 LAB — PREPARE RBC (CROSSMATCH)

## 2023-03-01 MED ORDER — MEGESTROL ACETATE 40 MG PO TABS
40.0000 mg | ORAL_TABLET | Freq: Two times a day (BID) | ORAL | Status: DC
  Administered 2023-03-01 – 2023-03-03 (×4): 40 mg via ORAL
  Filled 2023-03-01 (×5): qty 1

## 2023-03-01 MED ORDER — ONDANSETRON HCL 4 MG PO TABS: 4.0000 mg | ORAL_TABLET | Freq: Four times a day (QID) | ORAL | Status: DC | PRN

## 2023-03-01 MED ORDER — ESTROGENS CONJUGATED 25 MG IJ SOLR
25.0000 mg | Freq: Once | INTRAMUSCULAR | Status: AC
  Administered 2023-03-03: 25 mg via INTRAVENOUS
  Filled 2023-03-01: qty 25

## 2023-03-01 MED ORDER — ONDANSETRON HCL 4 MG/2ML IJ SOLN: 4.0000 mg | Freq: Four times a day (QID) | INTRAMUSCULAR | Status: DC | PRN

## 2023-03-01 MED ORDER — ACETAMINOPHEN 650 MG RE SUPP: 650.0000 mg | Freq: Four times a day (QID) | RECTAL | Status: DC | PRN

## 2023-03-01 MED ORDER — PHYTONADIONE 5 MG PO TABS
2.5000 mg | ORAL_TABLET | Freq: Once | ORAL | Status: AC
  Administered 2023-03-01: 2.5 mg via ORAL
  Filled 2023-03-01: qty 1

## 2023-03-01 MED ORDER — SODIUM CHLORIDE 0.9% IV SOLUTION: Freq: Once | INTRAVENOUS | Status: DC

## 2023-03-01 MED ORDER — ACETAMINOPHEN 325 MG PO TABS: 650.0000 mg | ORAL_TABLET | Freq: Four times a day (QID) | ORAL | Status: DC | PRN

## 2023-03-01 NOTE — ED Triage Notes (Signed)
BIB [facility] from home for vag bleed x 3 days clots became worse today per caregiver. Admits to Warfarin daily. Admits that has happened in the past but went away,has not stopped this time. A&O x4

## 2023-03-01 NOTE — ED Notes (Signed)
ED TO INPATIENT HANDOFF REPORT  Name/Age/Gender Zoe Cobb 67 y.o. female  Code Status    Code Status Orders  (From admission, onward)           Start     Ordered   03/01/23 1501  Full code  Continuous       Question:  By:  Answer:  Consent: discussion documented in EHR   03/01/23 1501           Code Status History     Date Active Date Inactive Code Status Order ID Comments User Context   11/07/2022 0954 11/09/2022 1634 Full Code [id number]  Jonah Blue, MD Inpatient   09/09/2022 1743 09/12/2022 0203 Full Code [id number]  Champ Mungo, DO ED   11/01/2021 1500 11/04/2021 0209 Full Code [id number]  Bobette Mo, MD ED   08/25/2021 1337 08/30/2021 0038 Full Code [id number]  Orland Mustard, MD ED   12/12/2020 1147 12/17/2020 0714 Full Code [id number]  Dolan Amen, MD ED   10/12/2016 1539 10/18/2016 1903 Full Code [id number]  Berton Bon, MD Inpatient   09/27/2016 1550 09/30/2016 1956 Full Code [id number]  Raliegh Ip, DO Inpatient   08/28/2016 1409 08/30/2016 1944 Full Code [id number]  Beaulah Dinning, MD ED       Home/SNF/Other Home  Chief Complaint Vagina bleeding [N93.9]  Level of Care/Admitting Diagnosis ED Disposition     ED Disposition  Admit   Condition  --   Comment  Hospital Area: [facility] [[id number]]  Level of Care: Telemetry [5]  Admit to tele based on following criteria: Complex arrhythmia (Bradycardia/Tachycardia)  May place patient in observation at [facility] or [facility] if equivalent level of care is available:: No  Covid Evaluation: Asymptomatic - no recent exposure (last 10 days) testing not required  Diagnosis: Vagina bleeding [188416]  Admitting Physician: Bobette Mo [[id number]]  Attending Physician: Bobette Mo [[id number]]          Medical History Past Medical History:  Diagnosis Date   CHF (congestive heart failure) (HCC)    Dysrhythmia    A-fib   Hypertension    Morbid  obesity (HCC)    Multiple sclerosis (HCC)    Paroxysmal atrial fibrillation (HCC)     Allergies Allergies  Allergen Reactions   Penicillins Swelling    Has patient had a PCN reaction causing immediate rash, facial/tongue/throat swelling, SOB or lightheadedness with hypotension: Yes Has patient had a PCN reaction causing severe rash involving mucus membranes or skin necrosis: No Has patient had a PCN reaction that required hospitalization No Has patient had a PCN reaction occurring within the last 10 years: No If all of the above answers are "NO", then may proceed with Cephalosporin use.     IV Location/Drains/Wounds Patient Lines/Drains/Airways Status     Active Line/Drains/Airways     Name Placement date Placement time Site Days   Peripheral IV 03/01/23 22 G Posterior;Right Hand 03/01/23  1105  Hand  less than 1            Labs/Imaging Results for orders placed or performed during the hospital encounter of 03/01/23 (from the past 48 hour(s))  Basic metabolic panel     Status: Abnormal   Collection Time: 03/01/23 11:00 AM  Result Value Ref Range   Sodium 137 135 - 145 mmol/L   Potassium 3.8 3.5 - 5.1 mmol/L   Chloride 101 98 - 111 mmol/L   CO2 24 22 - 32 mmol/L  Glucose, Bld 105 (H) 70 - 99 mg/dL    Comment: Glucose reference range applies only to samples taken after fasting for at least 8 hours.   BUN 16 8 - 23 mg/dL   Creatinine, Ser 6.21 0.44 - 1.00 mg/dL   Calcium 8.2 (L) 8.9 - 10.3 mg/dL   GFR, Estimated >30 >86 mL/min    Comment: (NOTE) Calculated using the CKD-EPI Creatinine Equation (2021)    Anion gap 12 5 - 15    Comment: Performed at [facility], [address] [place], [place] [address]  CBC with Differential     Status: Abnormal   Collection Time: 03/01/23 11:00 AM  Result Value Ref Range   WBC 8.8 4.0 - 10.5 K/uL   RBC 3.90 3.87 - 5.11 MIL/uL   Hemoglobin 7.7 (L) 12.0 - 15.0 g/dL    Comment: Reticulocyte Hemoglobin  testing may be clinically indicated, consider ordering this additional test [id number]    HCT 27.5 (L) 36.0 - 46.0 %   MCV 70.5 (L) 80.0 - 100.0 fL   MCH 19.7 (L) 26.0 - 34.0 pg   MCHC 28.0 (L) 30.0 - 36.0 g/dL   RDW 13.2 (H) 44.0 - 10.2 %   Platelets 379 150 - 400 K/uL   nRBC 0.0 0.0 - 0.2 %   Neutrophils Relative % 74 %   Neutro Abs 6.5 1.7 - 7.7 K/uL   Lymphocytes Relative 14 %   Lymphs Abs 1.2 0.7 - 4.0 K/uL   Monocytes Relative 7 %   Monocytes Absolute 0.7 0.1 - 1.0 K/uL   Eosinophils Relative 4 %   Eosinophils Absolute 0.3 0.0 - 0.5 K/uL   Basophils Relative 1 %   Basophils Absolute 0.0 0.0 - 0.1 K/uL   Immature Granulocytes 0 %   Abs Immature Granulocytes 0.03 0.00 - 0.07 K/uL    Comment: Performed at [facility], [address] [place], [place] [address]  Protime-INR     Status: Abnormal   Collection Time: 03/01/23 11:00 AM  Result Value Ref Range   Prothrombin Time 35.8 (H) 11.4 - 15.2 seconds   INR 3.6 (H) 0.8 - 1.2    Comment: (NOTE) INR goal varies based on device and disease states. Performed at [facility], [address] [place], [place] [address]   Type and screen [facility]     Status: None (Preliminary result)   Collection Time: 03/01/23  1:13 PM  Result Value Ref Range   ABO/RH(D) PENDING    Antibody Screen PENDING    Sample Expiration      03/04/2023,2359 Performed at [facility], [address], [place], [place] [address]    US PELVIS (TRANSABDOMINAL ONLY)  Result Date: 03/01/2023 CLINICAL DATA:  [id number] Vaginal bleeding [id number] EXAM: TRANSABDOMINAL ULTRASOUND OF PELVIS TECHNIQUE: Transabdominal ultrasound examination of the pelvis was performed including evaluation of the uterus, ovaries, adnexal regions, and pelvic cul-de-sac. COMPARISON:  CT scan abdomen and pelvis from 12/30/2022. FINDINGS: The technologist noted markedly limited exam due to body habitus and patient  related factors. Uterus Measurements: 5.7 x 7.0 x 13.4 cm = volume: 280.9 mL. There is a 4.4 x 5.0 x 5.2 cm lesion measured in the uterus, which corresponds to a fundal leiomyoma better seen on the prior CT scan from 12/30/2022. Endometrium: Markedly limited evaluation. Only a portion of endometrium seen, which is grossly within normal limits. : Up to 12 mm.  No focal abnormality visualized. Right ovary:  Not visualized. Left ovary: Not visualized. Other findings:  No abnormal free fluid. IMPRESSION: *Limited exam.  Probable uterine leiomyoma. *Markedly limited evaluation of endometrium. *Nonvisualization of bilateral ovaries. Electronically Signed   By: Jules Schick M.D.   On: 03/01/2023 14:07    Pending Labs Unresulted Labs (From admission, onward)     Start     Ordered   03/02/23 0500  CBC  Tomorrow morning,   R        03/01/23 1501   03/02/23 0500  Comprehensive metabolic panel  Tomorrow morning,   R        03/01/23 1501   03/01/23 1457  Prepare RBC (crossmatch)  (Blood Administration Adult)  Once,   R       Question Answer Comment  # of Units 1 unit   Transfusion Indications Hemoglobin 8 gm/dL or less and orthopedic or cardiac surgery or pre-existing cardiac condition   Number of Units to Keep Ahead NO units ahead   If emergent release call blood bank Not emergent release      03/01/23 1457   03/01/23 1456  CBC  Once,   STAT        03/01/23 1455            Vitals/Pain Today's Vitals   03/01/23 1001 03/01/23 1036 03/01/23 1045 03/01/23 1439  BP:  104/68 (!) 109/56   Pulse:   76   Resp:  16    Temp:  97.7 F (36.5 C)  97.7 F (36.5 C)  TempSrc:  Oral  Oral  SpO2:  95% 95%   PainSc: 0-No pain       Isolation Precautions No active isolations  Medications Medications  megestrol (MEGACE) tablet 40 mg (has no administration in time range)  conjugated estrogens (PREMARIN) injection 25 mg (has no administration in time range)  acetaminophen (TYLENOL) tablet 650 mg (has no  administration in time range)    Or  acetaminophen (TYLENOL) suppository 650 mg (has no administration in time range)  ondansetron (ZOFRAN) tablet 4 mg (has no administration in time range)    Or  ondansetron (ZOFRAN) injection 4 mg (has no administration in time range)  0.9 %  sodium chloride infusion (Manually program via Guardrails IV Fluids) (has no administration in time range)  phytonadione (VITAMIN K) tablet 2.5 mg (2.5 mg Oral Given 03/01/23 1304)    Mobility walks with person assist

## 2023-03-01 NOTE — ED Notes (Addendum)
Surveyor, mining, Charity fundraiser. Blood ready

## 2023-03-01 NOTE — H&P (Signed)
History and Physical    Patient: Zoe Cobb JYN:[id number] DOB: 27-Sep-1955 DOA: 03/01/2023 DOS: the patient was seen and examined on 03/01/2023 PCP: Marletta Lor, NP  Patient coming from: Home  Chief Complaint:  Chief Complaint  Patient presents with   Vaginal Bleeding   HPI: Zoe Cobb is a 67 y.o. female with medical history significant of chronic diastolic CHF, hypertension, cholelithiasis, chronic hepatitis C, diverticulosis, diverticulitis, history of lower GI bleed, class III obesity with a BMI of 84.08, multiple sclerosis, thrombocytopenia, paroxysmal atrial fibrillation on warfarin with a history of chronic/recurrent vaginal bleeding who came to the emergency department due to significant vaginal bleeding for the past 3 days. He denied fever, chills, rhinorrhea, sore throat, wheezing or hemoptysis.  She has been mildly lightheaded, but no chest pain, palpitations, diaphoresis, PND, orthopnea or pitting edema of the lower extremities.  No abdominal pain, nausea, emesis, diarrhea, constipation, melena or hematochezia.  No flank pain, dysuria, frequency or hematuria.  No polyuria, polydipsia, polyphagia or blurred vision.   Lab work: Her CBC showed a white count of 8.8, hemoglobin 7.7 g/dL platelets 865.  PT was 35.9 INR 3.6.  BMP showed a glucose of 105 and calcium of 8.2 mg/dL.  The rest of the BMP measurements were normal.  Imaging: Transabdominal ultrasound shows probable uterine leiomyoma, but overall limited study due to the patient's body habitus.  ED course: Initial vital signs were temperature 97.7 F, pulse 76, respiration 16, BP 104/68 mmHg O2 sat 95% on room air.  The EDP discussed the case with GYN on-call.  They recommended vitamin K 2.5 p.o. x 1, Megace 40 mg p.o. twice daily and transfusion as needed.  They stated that they will review the case later this evening or tomorrow morning.   Review of Systems: As mentioned in the history of present illness. All other  systems reviewed and are negative. Past Medical History:  Diagnosis Date   CHF (congestive heart failure) (HCC)    Dysrhythmia    A-fib   Hypertension    Morbid obesity (HCC)    Multiple sclerosis (HCC)    Paroxysmal atrial fibrillation (HCC)    Past Surgical History:  Procedure Laterality Date   COLONOSCOPY WITH PROPOFOL N/A 12/15/2020   Procedure: COLONOSCOPY WITH PROPOFOL;  Surgeon: Meryl Dare, MD;  Location: [facility] ENDOSCOPY;  Service: Endoscopy;  Laterality: N/A;   Social History:  reports that she quit smoking about 28 years ago. Her smoking use included cigarettes. She started smoking about 37 years ago. She has a 9 pack-year smoking history. She has never used smokeless tobacco. She reports that she does not drink alcohol and does not use drugs.  Allergies  Allergen Reactions   Penicillins Swelling    Has patient had a PCN reaction causing immediate rash, facial/tongue/throat swelling, SOB or lightheadedness with hypotension: Yes Has patient had a PCN reaction causing severe rash involving mucus membranes or skin necrosis: No Has patient had a PCN reaction that required hospitalization No Has patient had a PCN reaction occurring within the last 10 years: No If all of the above answers are "NO", then may proceed with Cephalosporin use.     Family History  Problem Relation Age of Onset   Hypertension Mother    Diabetes Mother    Stomach cancer Father    Kidney disease Sister    Diabetes Sister    Diabetes Sister    Diabetes Sister    Heart attack Brother    HIV/AIDS Brother  Diabetes Brother     Prior to Admission medications   Medication Sig Start Date End Date Taking? Authorizing Provider  acetaminophen (TYLENOL) 500 MG tablet Take 500 mg by mouth every 6 (six) hours as needed for moderate pain, mild pain, fever or headache.    [provider]  docusate sodium (COLACE) 100 MG capsule Take 1 capsule (100 mg total) by mouth daily. 11/09/22 11/09/23   Danford, Earl Lites, MD  ferrous sulfate 325 (65 FE) MG EC tablet Take 1 tablet (325 mg total) by mouth 2 (two) times daily. 11/09/22 11/09/23  Alberteen Sam, MD  isosorbide mononitrate (IMDUR) 60 MG 24 hr tablet Take 60 mg by mouth daily. 11/10/22   [provider]  metoprolol succinate (TOPROL-XL) 50 MG 24 hr tablet Take 50 mg by mouth daily. Take with or immediately following a meal.    [provider]  Multiple Vitamins-Minerals (ONE A DAY WOMEN 50 PLUS PO) Take 1 tablet by mouth daily.    [provider]  torsemide (DEMADEX) 20 MG tablet Take 20 mg by mouth 2 (two) times daily. 09/29/22   [provider]  warfarin (COUMADIN) 1 MG tablet Take 1 tablet (1 mg total) by mouth daily. Take with 1 mg tab to equal 3.5 mg daily dose 11/09/22 11/09/23  Danford, Earl Lites, MD  warfarin (COUMADIN) 2.5 MG tablet Take 1 tablet (2.5 mg total) by mouth daily. Take with 1 mg tab to equal 3.5 mg daily dose 11/09/22 11/09/23  Alberteen Sam, MD    Physical Exam: Vitals:   03/01/23 1036 03/01/23 1045 03/01/23 1439  BP: 104/68 (!) 109/56   Pulse:  76   Resp: 16    Temp: 97.7 F (36.5 C)  97.7 F (36.5 C)  TempSrc: Oral  Oral  SpO2: 95% 95%    Physical Exam Vitals and nursing note reviewed.  Constitutional:      General: She is awake. She is not in acute distress.    Appearance: Normal appearance. She is morbidly obese.  HENT:     Head: Normocephalic.     Nose: No rhinorrhea.     Mouth/Throat:     Mouth: Mucous membranes are moist.  Eyes:     General: No scleral icterus.    Pupils: Pupils are equal, round, and reactive to light.  Neck:     Vascular: No JVD.  Cardiovascular:     Rate and Rhythm: Normal rate and regular rhythm.     Heart sounds: S1 normal and S2 normal.     Comments: Stage III lymphedema. Pulmonary:     Effort: Pulmonary effort is normal.     Breath sounds: Normal breath sounds.  Abdominal:     General: Bowel sounds are normal.      Palpations: Abdomen is soft.  Musculoskeletal:     Cervical back: Neck supple.     Right lower leg: Edema present.     Left lower leg: Edema present.  Skin:    General: Skin is warm and dry.  Neurological:     General: No focal deficit present.     Mental Status: She is alert and oriented to person, place, and time.  Psychiatric:        Mood and Affect: Mood normal.        Behavior: Behavior normal. Behavior is cooperative.     Data Reviewed:  Results are pending, will review when available.  Assessment and Plan: Acute on chronic blood loss anemia Secondary to  Vagina bleeding Observation/PCU. Transfused 1 units of PRBC. Monitor hematocrit and hemoglobin. Transfuse further as needed. GYN recommended: Vitamin K 2.5 mg p.o. x 1. Megace 40 mg twice daily. However, risk for thromboembolism would increase substantially. Follow-up with GYN as an outpatient.   Active Problems:   Morbid obesity due to excess calories (HCC) Awaiting updated weight measurement. Needs lifestyle modifications. Consider nutritional services evaluation. Follow-up with PCP and/or bariatric clinic.     Chronic diastolic (congestive) heart failure (HCC) No signs of decompensation at the moment.     Paroxysmal atrial fibrillation (HCC) CHA?DS?-VASc Score of at least 4. Resume Toprol-XL 25 mg p.o. daily. Holding warfarin due to bleeding.     Essential hypertension Resume metoprolol. Monitor blood pressure and heart rate. Monitor renal function electrolytes.    Hypocalcemia  Check calcium with albumin level in the morning.    Advance Care Planning:   Code Status: Full Code   Consults:   Family Communication:   Severity of Illness: The appropriate patient status for this patient is OBSERVATION. Observation status is judged to be reasonable and necessary in order to provide the required intensity of service to ensure the patient's safety. The patient's presenting symptoms, physical  exam findings, and initial radiographic and laboratory data in the context of their medical condition is felt to place them at decreased risk for further clinical deterioration. Furthermore, it is anticipated that the patient will be medically stable for discharge from the hospital within 2 midnights of admission.   Author: Bobette Mo, MD 03/01/2023 2:57 PM  For on call review [email or web address].

## 2023-03-01 NOTE — ED Notes (Signed)
Blood bank has blood ready for this patient.

## 2023-03-01 NOTE — ED Provider Notes (Signed)
Zoe Cobb   CSN: [id number] Arrival date & time: 03/01/23  4098     History  Chief Complaint  Patient presents with   Vaginal Bleeding    Zoe Cobb is a 67 y.o. female.  Zoe Cobb is a 67 y.o. female with hx of CHF, obesity, Afib on anticoagulation, HTN, who presents via EMS for the patient with vaginal bleeding.  Patient reports that bleeding started 3 days ago but yesterday her caregiver noted that she was passing large clots.  She does take warfarin daily and last took this medication yesterday.  Last INR check on 7/8, INR was 3.0, no adjustments made to regimen, goal is 2-2.5.  Followed by Dr. Jacinto Halim.  Patient reports that she did have a similar episode of vaginal bleeding in April due to elevated INR and was admitted to the hospital.  Has since followed up with Dr. Briscoe Deutscher with OB/GYN was supposed to have a diagnostic hysteroscopy for postmenopausal bleeding but this had to be rescheduled    Vaginal Bleeding Associated symptoms: no fever        Home Medications Prior to Admission medications   Medication Sig Start Date End Date Taking? Authorizing Provider  acetaminophen (TYLENOL) 500 MG tablet Take 500 mg by mouth every 6 (six) hours as needed for moderate pain, mild pain, fever or headache.    [provider]  docusate sodium (COLACE) 100 MG capsule Take 1 capsule (100 mg total) by mouth daily. 11/09/22 11/09/23  Danford, Earl Lites, MD  ferrous sulfate 325 (65 FE) MG EC tablet Take 1 tablet (325 mg total) by mouth 2 (two) times daily. 11/09/22 11/09/23  Alberteen Sam, MD  isosorbide mononitrate (IMDUR) 60 MG 24 hr tablet Take 60 mg by mouth daily. 11/10/22   [provider]  metoprolol succinate (TOPROL-XL) 50 MG 24 hr tablet Take 50 mg by mouth daily. Take with or immediately following a meal.    [provider]  Multiple Vitamins-Minerals (ONE A DAY WOMEN 50 PLUS PO)  Take 1 tablet by mouth daily.    [provider]  torsemide (DEMADEX) 20 MG tablet Take 20 mg by mouth 2 (two) times daily. 09/29/22   [provider]  warfarin (COUMADIN) 1 MG tablet Take 1 tablet (1 mg total) by mouth daily. Take with 1 mg tab to equal 3.5 mg daily dose 11/09/22 11/09/23  Danford, Earl Lites, MD  warfarin (COUMADIN) 2.5 MG tablet Take 1 tablet (2.5 mg total) by mouth daily. Take with 1 mg tab to equal 3.5 mg daily dose 11/09/22 11/09/23  Danford, Earl Lites, MD      Allergies    Penicillins    Review of Systems   Review of Systems  Constitutional:  Negative for chills and fever.  Genitourinary:  Positive for vaginal bleeding.  All other systems reviewed and are negative.   Physical Exam Updated Vital Signs There were no vitals taken for this visit. Physical Exam Vitals and nursing Cobb reviewed.  Constitutional:      General: She is not in acute distress.    Appearance: Normal appearance. She is well-developed. She is obese. She is not ill-appearing or diaphoretic.     Comments: Alert, chronically ill-appearing but in no acute distress  HENT:     Head: Normocephalic and atraumatic.  Eyes:     General:        Right eye: No discharge.        Left  eye: No discharge.     Comments: Conjunctivae pale  Cardiovascular:     Rate and Rhythm: Normal rate and regular rhythm.     Pulses: Normal pulses.     Heart sounds: Normal heart sounds.  Pulmonary:     Effort: Pulmonary effort is normal. No respiratory distress.     Breath sounds: Normal breath sounds. No wheezing or rales.     Comments: Respirations equal and unlabored, patient able to speak in full sentences, lungs clear to auscultation bilaterally  Abdominal:     General: Bowel sounds are normal. There is no distension.     Palpations: Abdomen is soft. There is no mass.     Tenderness: There is no abdominal tenderness. There is no guarding.     Comments: Abdomen soft, nondistended, nontender to  palpation in all quadrants without guarding or peritoneal signs  Genitourinary:    Comments: Chaperone was present during pelvic exam. Exam significantly limited due to body habitus, active bleeding noted with multiple dime sized clots present Musculoskeletal:        General: No deformity.     Cervical back: Neck supple.  Skin:    General: Skin is warm and dry.     Capillary Refill: Capillary refill takes less than 2 seconds.  Neurological:     Mental Status: She is alert and oriented to person, place, and time.     Coordination: Coordination normal.     Comments: Speech is clear, able to follow commands Moves extremities without ataxia, coordination intact  Psychiatric:        Mood and Affect: Mood normal.        Behavior: Behavior normal.     ED Results / Procedures / Treatments   Labs (all labs ordered are listed, but only abnormal results are displayed) Labs Reviewed  BASIC METABOLIC PANEL - Abnormal; Notable for the following components:      Result Value   Glucose, Bld 105 (*)    Calcium 8.2 (*)    All other components within normal limits  CBC WITH DIFFERENTIAL/PLATELET - Abnormal; Notable for the following components:   Hemoglobin 7.7 (*)    HCT 27.5 (*)    MCV 70.5 (*)    MCH 19.7 (*)    MCHC 28.0 (*)    RDW 20.2 (*)    All other components within normal limits  PROTIME-INR - Abnormal; Notable for the following components:   Prothrombin Time 35.8 (*)    INR 3.6 (*)    All other components within normal limits  TYPE AND SCREEN  PREPARE RBC (CROSSMATCH)  TYPE AND SCREEN  PREPARE RBC (CROSSMATCH)  BLOOD TRANSFUSION REPORT - SCANNED   Narrative:    Ordered by an unspecified provider.    EKG None  Radiology US PELVIS (TRANSABDOMINAL ONLY)  Result Date: 03/01/2023 CLINICAL DATA:  [id number] Vaginal bleeding [id number] EXAM: TRANSABDOMINAL ULTRASOUND OF PELVIS TECHNIQUE: Transabdominal ultrasound examination of the pelvis was performed including evaluation of the  uterus, ovaries, adnexal regions, and pelvic cul-de-sac. COMPARISON:  CT scan abdomen and pelvis from 12/30/2022. FINDINGS: The technologist noted markedly limited exam due to body habitus and patient related factors. Uterus Measurements: 5.7 x 7.0 x 13.4 cm = volume: 280.9 mL. There is a 4.4 x 5.0 x 5.2 cm lesion measured in the uterus, which corresponds to a fundal leiomyoma better seen on the prior CT scan from 12/30/2022. Endometrium: Markedly limited evaluation. Only a portion of endometrium seen, which is grossly within normal limits. :  Up to 12 mm.  No focal abnormality visualized. Right ovary: Not visualized. Left ovary: Not visualized. Other findings:  No abnormal free fluid. IMPRESSION: *Limited exam.  Probable uterine leiomyoma. *Markedly limited evaluation of endometrium. *Nonvisualization of bilateral ovaries. Electronically Signed   By: Jules Schick M.D.   On: 03/01/2023 14:07     Procedures .Critical Care  Performed by: Dartha Lodge, PA-C Authorized by: Dartha Lodge, PA-C   Critical care provider statement:    Critical care time (minutes):  30   Critical care was necessary to treat or prevent imminent or life-threatening deterioration of the following conditions:  Circulatory failure   Critical care was time spent personally by me on the following activities:  Development of treatment plan with patient or surrogate, discussions with consultants, evaluation of patient's response to treatment, examination of patient, ordering and review of laboratory studies, ordering and review of radiographic studies, ordering and performing treatments and interventions, pulse oximetry, re-evaluation of patient's condition and review of old charts   Care discussed with: admitting provider       Medications Ordered in ED Medications  phytonadione (VITAMIN K) tablet 2.5 mg (2.5 mg Oral Given 03/01/23 1304)  conjugated estrogens (PREMARIN) injection 25 mg (25 mg Intravenous Given by Other 03/03/23  1246)  0.9 %  sodium chloride infusion (Manually program via Guardrails IV Fluids) (0 mLs Intravenous Stopped 03/03/23 1247)  phytonadione (VITAMIN K) tablet 2.5 mg (2.5 mg Oral Given 03/02/23 1224)    ED Course/ Medical Decision Making/ A&P                             Medical Decision Making Amount and/or Complexity of Data Reviewed Labs: ordered. Radiology: ordered.  Risk Prescription drug management. Decision regarding hospitalization.   67 y.o. female presents to the ED with complaints of vaginal bleeding, this involves an extensive number of treatment options, and is a complaint that carries with it a high risk of complications and morbidity.  The differential diagnosis includes warfarin coagulopathy, postmenopausal bleeding due to endometrial cancer or uterine fibroids.  On arrival pt is nontoxic, vitals stable, blood pressure of 104/68, no tachycardia despite bleeding fortunately.   Additional history obtained from EMS. Previous records obtained and reviewed including prior admission for elevated INR and vaginal bleeding as well as gynecology clinic notes and prior pelvic ultrasound  Lab Tests:  I Ordered, reviewed, and interpreted labs, which included: INR 3.6, hemoglobin 7.7, was 8.32 months ago.  No leukocytosis, no significant electrolyte derangements and normal renal function.  Imaging Studies ordered:  I ordered imaging studies which included a transabdominal pelvic ultrasound, I independently visualized and interpreted imaging which showed uterine leiomyoma, exam very limited due to body habitus  ED Course:   Critical interventions: Vitamin K given for elevated INR and active bleeding, fortunately patient remains hemodynamically stable  I consulted gynecology and discussed lab and imaging findings with Dr. Alysia Penna, who recommends giving Premarin to help slow vaginal bleeding.  Will see patient in consult and look into when diagnostic hysteroscopy can be scheduled  Case  discussed with Dr. Robb Matar with [facility] who will see and admit the patient.  Request 1 unit blood transfusion.    Portions of this Cobb were generated with Scientist, clinical (histocompatibility and immunogenetics). Dictation errors may occur despite best attempts at proofreading.         Final Clinical Impression(s) / ED Diagnoses Final diagnoses:  Postmenopausal vaginal bleeding  Elevated  INR    Rx / DC Orders ED Discharge Orders     None         Legrand Rams 03/22/23 1117    Lorre Nick, MD 03/26/23 1443

## 2023-03-02 DIAGNOSIS — N950 Postmenopausal bleeding: Secondary | ICD-10-CM | POA: Diagnosis not present

## 2023-03-02 DIAGNOSIS — I5032 Chronic diastolic (congestive) heart failure: Secondary | ICD-10-CM

## 2023-03-02 DIAGNOSIS — N939 Abnormal uterine and vaginal bleeding, unspecified: Secondary | ICD-10-CM | POA: Diagnosis not present

## 2023-03-02 LAB — VITAMIN B12: Vitamin B-12: 228 pg/mL (ref 180–914)

## 2023-03-02 LAB — TYPE AND SCREEN
ABO/RH(D): A POS
Antibody Screen: NEGATIVE
Unit division: 0

## 2023-03-02 LAB — CBC
HCT: 29.8 % — ABNORMAL LOW (ref 36.0–46.0)
Hemoglobin: 8.7 g/dL — ABNORMAL LOW (ref 12.0–15.0)
MCH: 21.4 pg — ABNORMAL LOW (ref 26.0–34.0)
MCHC: 29.2 g/dL — ABNORMAL LOW (ref 30.0–36.0)
MCV: 73.4 fL — ABNORMAL LOW (ref 80.0–100.0)
Platelets: 373 10*3/uL (ref 150–400)
RBC: 4.06 MIL/uL (ref 3.87–5.11)
RDW: 21.4 % — ABNORMAL HIGH (ref 11.5–15.5)
WBC: 8.2 10*3/uL (ref 4.0–10.5)
nRBC: 0 % (ref 0.0–0.2)

## 2023-03-02 LAB — BPAM RBC
Blood Product Expiration Date: 202407292359
ISSUE DATE / TIME: 202407261246
Unit Type and Rh: 6200

## 2023-03-02 LAB — FERRITIN: Ferritin: 16 ng/mL (ref 11–307)

## 2023-03-02 LAB — RETICULOCYTES
Immature Retic Fract: 27.4 % — ABNORMAL HIGH (ref 2.3–15.9)
RBC.: 3.83 MIL/uL — ABNORMAL LOW (ref 3.87–5.11)
Retic Count, Absolute: 44.4 10*3/uL (ref 19.0–186.0)
Retic Ct Pct: 1.2 % (ref 0.4–3.1)

## 2023-03-02 LAB — IRON AND TIBC
Iron: 18 ug/dL — ABNORMAL LOW (ref 28–170)
Saturation Ratios: 7 % — ABNORMAL LOW (ref 10.4–31.8)
TIBC: 252 ug/dL (ref 250–450)
UIBC: 234 ug/dL

## 2023-03-02 LAB — PROTIME-INR
INR: 3.4 — ABNORMAL HIGH (ref 0.8–1.2)
Prothrombin Time: 34.7 seconds — ABNORMAL HIGH (ref 11.4–15.2)

## 2023-03-02 LAB — FOLATE: Folate: 23.2 ng/mL (ref >5.9)

## 2023-03-02 LAB — PREPARE RBC (CROSSMATCH)

## 2023-03-02 MED ORDER — PHYTONADIONE 5 MG PO TABS
2.5000 mg | ORAL_TABLET | Freq: Once | ORAL | Status: AC
  Administered 2023-03-02: 2.5 mg via ORAL
  Filled 2023-03-02: qty 1

## 2023-03-02 MED ORDER — SODIUM CHLORIDE 0.9% IV SOLUTION: Freq: Once | INTRAVENOUS | Status: AC

## 2023-03-02 MED ORDER — SODIUM CHLORIDE 0.9 % IV SOLN
250.0000 mg | Freq: Every day | INTRAVENOUS | Status: DC
  Administered 2023-03-02: 250 mg via INTRAVENOUS
  Filled 2023-03-02 (×2): qty 20

## 2023-03-02 MED ORDER — VITAMIN B-12 1000 MCG PO TABS
500.0000 ug | ORAL_TABLET | Freq: Every day | ORAL | Status: DC
  Administered 2023-03-02 – 2023-03-03 (×2): 500 ug via ORAL
  Filled 2023-03-02 (×2): qty 1

## 2023-03-02 NOTE — Progress Notes (Signed)
[facility] PROGRESS NOTE    Progress Note  Florrie Fritsche  GNF:[id number] DOB: 1956-04-16 DOA: 03/01/2023 PCP: Marletta Lor, NP     Brief Narrative:   Zoe Cobb is an 67 y.o. female past medical history significant for chronic diastolic heart failure, essential hypertension chronic hepatitis C, diverticulosis, history of GI bleed MS, paroxysmal atrial fibrillation on Coumadin with a history of recurrent vaginal bleed comes into the emergency room for bleeding for 3 days, hemoglobin in the ED was 7.7 platelet count 379 PT 35 INR 3.6.  Transabdominal ultrasound showed probable uterine leiomyoma   Assessment/Plan:   Acute on chronic blood loss anemia secondary to  Vagina bleeding in the setting of Coumadin use: She is status post 1 unit of packed red blood cells still 7.7, will transfuse an additional unit packed red blood cells recheck a CBC post transfusional. Will go ahead and hold Coumadin. GI was curb sided recommended vitamin K, INR on admission was 3.6 INR this morning is pending. GYN has texted me they relate her surgery has been rescheduled for 03/12/2023. Check an anemia panel.  Morbid obesity due to excess calories [facility]) Noted she has been counseled.  Chronic diastolic (congestive) heart failure (HCC) Zoe signs of decompensation at this time continue Toprol.  Paroxysmal atrial fibrillation (HCC) Paroxysmal atrial fibrillation holding Coumadin she received IV vitamin K on admission. Continue Toprol. Chads Vascor at least 4.  Essential hypertension: Continue metoprolol, blood pressure is controlled.  Hypocalcemia: Corrected for her albumin is unremarkable.   DVT prophylaxis: SCDs, INR still therapeutic Family Communication:none Status is: Observation The patient remains OBS appropriate and will d/c before 2 midnights.    Code Status:     Code Status Orders  (From admission, onward)           Start     Ordered   03/01/23 1501  Full code   Continuous       Question:  By:  Answer:  Consent: discussion documented in EHR   03/01/23 1501           Code Status History     Date Active Date Inactive Code Status Order ID Comments User Context   11/07/2022 0954 11/09/2022 1634 Full Code [id number]  Jonah Blue, MD Inpatient   09/09/2022 1743 09/12/2022 0203 Full Code [id number]  Champ Mungo, DO ED   11/01/2021 1500 11/04/2021 0209 Full Code [id number]  Bobette Mo, MD ED   08/25/2021 1337 08/30/2021 0038 Full Code [id number]  Orland Mustard, MD ED   12/12/2020 1147 12/17/2020 0714 Full Code [id number]  Dolan Amen, MD ED   10/12/2016 1539 10/18/2016 1903 Full Code [id number]  Berton Bon, MD Inpatient   09/27/2016 1550 09/30/2016 1956 Full Code [id number]  Raliegh Ip, DO Inpatient   08/28/2016 1409 08/30/2016 1944 Full Code [id number]  Beaulah Dinning, MD ED         IV Access:   Peripheral IV   Procedures and diagnostic studies:   US PELVIS (TRANSABDOMINAL ONLY)  Result Date: 03/01/2023 CLINICAL DATA:  [id number] Vaginal bleeding [id number] EXAM: TRANSABDOMINAL ULTRASOUND OF PELVIS TECHNIQUE: Transabdominal ultrasound examination of the pelvis was performed including evaluation of the uterus, ovaries, adnexal regions, and pelvic cul-de-sac. COMPARISON:  CT scan abdomen and pelvis from 12/30/2022. FINDINGS: The technologist noted markedly limited exam due to body habitus and patient related factors. Uterus Measurements: 5.7 x 7.0 x 13.4 cm = volume: 280.9 mL. There is a 4.4 x 5.0 x 5.2 cm lesion measured in the  uterus, which corresponds to a fundal leiomyoma better seen on the prior CT scan from 12/30/2022. Endometrium: Markedly limited evaluation. Only a portion of endometrium seen, which is grossly within normal limits. : Up to 12 mm.  Zoe focal abnormality visualized. Right ovary: Not visualized. Left ovary: Not visualized. Other findings:  Zoe abnormal free fluid. IMPRESSION: *Limited exam.  Probable uterine leiomyoma.  *Markedly limited evaluation of endometrium. *Nonvisualization of bilateral ovaries. Electronically Signed   By: Jules Schick M.D.   On: 03/01/2023 14:07     Medical Consultants:   None.   Subjective:    Zoe Cobb Zoe complaints.  Objective:    Vitals:   03/01/23 2004 03/02/23 0023 03/02/23 0549 03/02/23 0601  BP: 118/64 (!) 117/56 (!) 83/50 (!) 93/58  Pulse: 80 86 87 84  Resp: 18 18 18    Temp: 98.5 F (36.9 C) 98.7 F (37.1 C) 98.2 F (36.8 C)   TempSrc: Oral Oral Oral   SpO2: 97% 97% 95%    SpO2: 95 %   Intake/Output Summary (Last 24 hours) at 03/02/2023 0847 Last data filed at 03/02/2023 0559 Gross per 24 hour  Intake 572 ml  Output 0 ml  Net 572 ml   There were Zoe vitals filed for this visit.  Exam: General exam: In Zoe acute distress. Respiratory system: Good air movement and clear to auscultation. Cardiovascular system: S1 & S2 heard, RRR. Zoe JVD. Gastrointestinal system: Abdomen is nondistended, soft and nontender.  Extremities: Zoe pedal edema. Skin: Zoe rashes, lesions or ulcers Psychiatry: Judgement and insight appear normal. Mood & affect appropriate.    Data Reviewed:    Labs: Basic Metabolic Panel: Recent Labs  Lab 03/01/23 1100 03/02/23 0424  NA 137 138  K 3.8 3.6  CL 101 103  CO2 24 27  GLUCOSE 105* 158*  BUN 16 13  CREATININE 0.62 0.67  CALCIUM 8.2* 7.9*   GFR CrCl cannot be calculated (Unknown ideal weight.). Liver Function Tests: Recent Labs  Lab 03/02/23 0424  AST 17  ALT 12  ALKPHOS 72  BILITOT 0.4  PROT 6.7  ALBUMIN 2.0*   Zoe results for input(s): "LIPASE", "AMYLASE" in the last 168 hours. Zoe results for input(s): "AMMONIA" in the last 168 hours. Coagulation profile Recent Labs  Lab 03/01/23 1100  INR 3.6*   COVID-19 Labs  Zoe results for input(s): "DDIMER", "FERRITIN", "LDH", "CRP" in the last 72 hours.  Lab Results  Component Value Date   SARSCOV2NAA NEGATIVE 08/25/2021   SARSCOV2NAA NEGATIVE  12/12/2020    CBC: Recent Labs  Lab 03/01/23 1100 03/01/23 1652 03/02/23 0424  WBC 8.8  --  9.1  NEUTROABS 6.5  --   --   HGB 7.7* 8.0* 7.7*  HCT 27.5* 28.6* 26.9*  MCV 70.5*  --  71.9*  PLT 379  --  372   Cardiac Enzymes: Zoe results for input(s): "CKTOTAL", "CKMB", "CKMBINDEX", "TROPONINI" in the last 168 hours. BNP (last 3 results) Zoe results for input(s): "PROBNP" in the last 8760 hours. CBG: Zoe results for input(s): "GLUCAP" in the last 168 hours. D-Dimer: Zoe results for input(s): "DDIMER" in the last 72 hours. Hgb A1c: Zoe results for input(s): "HGBA1C" in the last 72 hours. Lipid Profile: Zoe results for input(s): "CHOL", "HDL", "LDLCALC", "TRIG", "CHOLHDL", "LDLDIRECT" in the last 72 hours. Thyroid function studies: Zoe results for input(s): "TSH", "T4TOTAL", "T3FREE", "THYROIDAB" in the last 72 hours.  Invalid input(s): "FREET3" Anemia work up: Recent Labs    03/02/23 0813  RETICCTPCT  1.2   Sepsis Labs: Recent Labs  Lab 03/01/23 1100 03/02/23 0424  WBC 8.8 9.1   Microbiology Zoe results found for this or any previous visit (from the past 240 hour(s)).   Medications:    sodium chloride   Intravenous Once   sodium chloride   Intravenous Once   conjugated estrogens  25 mg Intravenous Once   megestrol  40 mg Oral BID   Continuous Infusions:    LOS: 0 days   Marinda Elk  [facility]  03/02/2023, 8:47 AM

## 2023-03-02 NOTE — Care Management Obs Status (Signed)
MEDICARE OBSERVATION STATUS NOTIFICATION   Patient Details  Name: Zoe Cobb MRN: [id number] Date of Birth: 01-Jun-1956   Medicare Observation Status Notification Given:  Hart Robinsons, LCSW 03/02/2023, 2:46 PM

## 2023-03-03 DIAGNOSIS — N950 Postmenopausal bleeding: Secondary | ICD-10-CM | POA: Diagnosis not present

## 2023-03-03 DIAGNOSIS — N939 Abnormal uterine and vaginal bleeding, unspecified: Secondary | ICD-10-CM | POA: Diagnosis not present

## 2023-03-03 MED ORDER — WARFARIN SODIUM 5 MG PO TABS: 5.0000 mg | ORAL_TABLET | ORAL | Status: DC

## 2023-03-03 MED ORDER — CYANOCOBALAMIN 500 MCG PO TABS: 500.0000 ug | ORAL_TABLET | Freq: Every day | ORAL | 1 refills | Status: DC

## 2023-03-03 MED ORDER — WARFARIN SODIUM 2.5 MG PO TABS: 2.5000 mg | ORAL_TABLET | Freq: Every day | ORAL | Status: DC

## 2023-03-03 NOTE — Discharge Summary (Signed)
Physician Discharge Summary  Zoe Cobb ZOX:[id number] DOB: January 11, 1956 DOA: 03/01/2023  PCP: Marletta Lor, NP  Admit date: 03/01/2023 Discharge date: 03/03/2023  Admitted From: Home Disposition:  Home  Recommendations for Outpatient Follow-up:  Follow up with GYN for surgery on 03/12/2023   Home Health:no Equipment/Devices:None  Discharge Condition:Stable CODE STATUS:Full Diet recommendation: Heart Healthy    Brief/Interim Summary: 67 y.o. female past medical history significant for chronic diastolic heart failure, essential hypertension chronic hepatitis C, diverticulosis, history of GI bleed MS, paroxysmal atrial fibrillation on Coumadin with a history of recurrent vaginal bleed comes into the emergency room for bleeding for 3 days, hemoglobin in the ED was 7.7 platelet count 379 PT 35 INR 3.6.  Transabdominal ultrasound showed probable uterine leiomyoma   Discharge Diagnoses:  Principal Problem:   Vagina bleeding Active Problems:   Morbid obesity due to excess calories (HCC)   Chronic diastolic (congestive) heart failure (HCC)   Paroxysmal atrial fibrillation (HCC)   Essential hypertension   Hypocalcemia Acute on chronic blood loss anemia secondary to vaginal bleed in the setting of Coumadin use and leiomyoma: She status post 2 unit packed red blood cells, her hemoglobin this morning is 8.7. Her Coumadin was held she was given vitamin K her INR slowly drifted down. She will continue to be off Coumadin until surgery. Her iron level was low she was given IV iron.  Morbid obesity due to excessive caloric intake: Noted she has been counseled.  Chronic diastolic heart failure: No signs of decompensation no changes made to her medication.  Paroxysmal atrial fibrillation: Rate controlled metoprolol with a chads Vascor of at least 5. Currently in sinus rhythm. Will continue to hold Coumadin as her surgery is next week she can resume it once OB/GYN relates is okay to restart  Coumadin.  Essential hypertension: Continue metoprolol.      Discharge Instructions  Discharge Instructions     Diet - low sodium heart healthy   Complete by: As directed    Increase activity slowly   Complete by: As directed       Allergies as of 03/03/2023       Reactions   Penicillins Swelling   Has patient had a PCN reaction causing immediate rash, facial/tongue/throat swelling, SOB or lightheadedness with hypotension: Yes Has patient had a PCN reaction causing severe rash involving mucus membranes or skin necrosis: No Has patient had a PCN reaction that required hospitalization No Has patient had a PCN reaction occurring within the last 10 years: No If all of the above answers are "NO", then may proceed with Cephalosporin use.        Medication List     STOP taking these medications    docusate sodium 100 MG capsule Commonly known as: Colace       TAKE these medications    acetaminophen 500 MG tablet Commonly known as: TYLENOL Take 500 mg by mouth every 6 (six) hours as needed for moderate pain, mild pain, fever or headache.   cyanocobalamin 500 MCG tablet Commonly known as: VITAMIN B12 Take 1 tablet (500 mcg total) by mouth daily.   ferrous sulfate 325 (65 FE) MG EC tablet Take 1 tablet (325 mg total) by mouth 2 (two) times daily. What changed: when to take this   isosorbide mononitrate 60 MG 24 hr tablet Commonly known as: IMDUR Take 60 mg by mouth daily.   metformin 1000 MG (OSM) 24 hr tablet Commonly known as: FORTAMET Take 1,000 mg by mouth 2 (two) times  daily with a meal.   metoprolol succinate 50 MG 24 hr tablet Commonly known as: TOPROL-XL Take 50 mg by mouth daily. Take with or immediately following a meal.   ONE A DAY WOMEN 50 PLUS PO Take 1 tablet by mouth daily.   torsemide 20 MG tablet Commonly known as: DEMADEX Take 20 mg by mouth 2 (two) times daily.   warfarin 5 MG tablet Commonly known as: COUMADIN Take as directed.  If you are unsure how to take this medication, talk to your nurse or doctor. Original instructions: Take 1 tablet (5 mg total) by mouth See admin instructions. On Tues, and Thurs Start taking on: March 13, 2023 What changed:  These instructions start on March 13, 2023. If you are unsure what to do until then, ask your doctor or other care provider. Another medication with the same name was removed. Continue taking this medication, and follow the directions you see here.   warfarin 2.5 MG tablet Commonly known as: Coumadin Take as directed. If you are unsure how to take this medication, talk to your nurse or doctor. Original instructions: Take 1 tablet (2.5 mg total) by mouth daily. Take with 1 mg tab to equal 3.5 mg daily dose Start taking on: March 13, 2023 What changed:  These instructions start on March 13, 2023. If you are unsure what to do until then, ask your doctor or other care provider. Another medication with the same name was removed. Continue taking this medication, and follow the directions you see here.        Follow-up Information     Lorriane Shire, MD Follow up.   Specialty: Obstetrics and Gynecology Contact information: [address] [place] [address] [phone]                Allergies  Allergen Reactions   Penicillins Swelling    Has patient had a PCN reaction causing immediate rash, facial/tongue/throat swelling, SOB or lightheadedness with hypotension: Yes Has patient had a PCN reaction causing severe rash involving mucus membranes or skin necrosis: No Has patient had a PCN reaction that required hospitalization No Has patient had a PCN reaction occurring within the last 10 years: No If all of the above answers are "NO", then may proceed with Cephalosporin use.     Consultations: None   Procedures/Studies: US PELVIS (TRANSABDOMINAL ONLY)  Result Date: 03/01/2023 CLINICAL DATA:  [id number] Vaginal bleeding [id number] EXAM: TRANSABDOMINAL  ULTRASOUND OF PELVIS TECHNIQUE: Transabdominal ultrasound examination of the pelvis was performed including evaluation of the uterus, ovaries, adnexal regions, and pelvic cul-de-sac. COMPARISON:  CT scan abdomen and pelvis from 12/30/2022. FINDINGS: The technologist noted markedly limited exam due to body habitus and patient related factors. Uterus Measurements: 5.7 x 7.0 x 13.4 cm = volume: 280.9 mL. There is a 4.4 x 5.0 x 5.2 cm lesion measured in the uterus, which corresponds to a fundal leiomyoma better seen on the prior CT scan from 12/30/2022. Endometrium: Markedly limited evaluation. Only a portion of endometrium seen, which is grossly within normal limits. : Up to 12 mm.  No focal abnormality visualized. Right ovary: Not visualized. Left ovary: Not visualized. Other findings:  No abnormal free fluid. IMPRESSION: *Limited exam.  Probable uterine leiomyoma. *Markedly limited evaluation of endometrium. *Nonvisualization of bilateral ovaries. Electronically Signed   By: Jules Schick M.D.   On: 03/01/2023 14:07   (Echo, Carotid, EGD, Colonoscopy, ERCP)    Subjective: No complaints  Discharge Exam: Vitals:   03/02/23 2057 03/03/23 6295  BP: (!) 92/59 119/61  Pulse: 87 84  Resp: 18 18  Temp: 98.4 F (36.9 C) 97.7 F (36.5 C)  SpO2: 93% 98%   Vitals:   03/02/23 1315 03/02/23 1600 03/02/23 2057 03/03/23 0546  BP: (!) 113/57 110/60 (!) 92/59 119/61  Pulse: 90  87 84  Resp: 20 18 18 18   Temp: 98.5 F (36.9 C) 98.3 F (36.8 C) 98.4 F (36.9 C) 97.7 F (36.5 C)  TempSrc: Oral Oral Oral Oral  SpO2: 96% 95% 93% 98%    General: Pt is alert, awake, not in acute distress Cardiovascular: RRR, S1/S2 +, no rubs, no gallops Respiratory: CTA bilaterally, no wheezing, no rhonchi Abdominal: Soft, NT, ND, bowel sounds + Extremities: no edema, no cyanosis    The results of significant diagnostics from this hospitalization (including imaging, microbiology, ancillary and laboratory) are listed  below for reference.     Microbiology: No results found for this or any previous visit (from the past 240 hour(s)).   Labs: BNP (last 3 results) No results for input(s): "BNP" in the last 8760 hours. Basic Metabolic Panel: Recent Labs  Lab 03/01/23 1100 03/02/23 0424  NA 137 138  K 3.8 3.6  CL 101 103  CO2 24 27  GLUCOSE 105* 158*  BUN 16 13  CREATININE 0.62 0.67  CALCIUM 8.2* 7.9*   Liver Function Tests: Recent Labs  Lab 03/02/23 0424  AST 17  ALT 12  ALKPHOS 72  BILITOT 0.4  PROT 6.7  ALBUMIN 2.0*   No results for input(s): "LIPASE", "AMYLASE" in the last 168 hours. No results for input(s): "AMMONIA" in the last 168 hours. CBC: Recent Labs  Lab 03/01/23 1100 03/01/23 1652 03/02/23 0424 03/02/23 1816  WBC 8.8  --  9.1 8.2  NEUTROABS 6.5  --   --   --   HGB 7.7* 8.0* 7.7* 8.7*  HCT 27.5* 28.6* 26.9* 29.8*  MCV 70.5*  --  71.9* 73.4*  PLT 379  --  372 373   Cardiac Enzymes: No results for input(s): "CKTOTAL", "CKMB", "CKMBINDEX", "TROPONINI" in the last 168 hours. BNP: Invalid input(s): "POCBNP" CBG: No results for input(s): "GLUCAP" in the last 168 hours. D-Dimer No results for input(s): "DDIMER" in the last 72 hours. Hgb A1c No results for input(s): "HGBA1C" in the last 72 hours. Lipid Profile No results for input(s): "CHOL", "HDL", "LDLCALC", "TRIG", "CHOLHDL", "LDLDIRECT" in the last 72 hours. Thyroid function studies No results for input(s): "TSH", "T4TOTAL", "T3FREE", "THYROIDAB" in the last 72 hours.  Invalid input(s): "FREET3" Anemia work up Recent Labs    03/02/23 0813  VITAMINB12 228  FOLATE 23.2  FERRITIN 16  TIBC 252  IRON 18*  RETICCTPCT 1.2   Urinalysis    Component Value Date/Time   COLORURINE YELLOW 06/14/2022 1925   APPEARANCEUR HAZY (A) 06/14/2022 1925   LABSPEC 1.034 (H) 06/14/2022 1925   PHURINE 5.0 06/14/2022 1925   GLUCOSEU NEGATIVE 06/14/2022 1925   HGBUR LARGE (A) 06/14/2022 1925   BILIRUBINUR NEGATIVE  06/14/2022 1925   KETONESUR NEGATIVE 06/14/2022 1925   PROTEINUR 30 (A) 06/14/2022 1925   NITRITE NEGATIVE 06/14/2022 1925   LEUKOCYTESUR TRACE (A) 06/14/2022 1925   Sepsis Labs Recent Labs  Lab 03/01/23 1100 03/02/23 0424 03/02/23 1816  WBC 8.8 9.1 8.2   Microbiology No results found for this or any previous visit (from the past 240 hour(s)).    SIGNED:   Marinda Elk, MD  [facility] 03/03/2023, 8:29 AM Pager   If  7PM-7AM, please contact night-coverage [email or web address] Password [email or web address]

## 2023-03-03 NOTE — TOC Initial Note (Signed)
Transition of Care [facility]) - Initial/Assessment Note    Patient Details  Name: Zoe Cobb MRN: [id number] Date of Birth: 03-25-1956  [facility] CM/SW Contact:    Adrian Prows, RN Phone Number: 03/03/2023, 11:52 AM  Clinical Narrative:                 [facility] consult for d/c planning; spoke w/ pt in room; pt says she is from home and plans to return at d/c; she identified POC Marin Roberts (dtr) [phone]; pt denies food/housing insecurity, and difficulty paying for food; she uses [facility] for transport; pt says she has glasses, wheel chair and hospital bed; she has HH Aides w/ [facility]; pt says she does not have home oxygen; she verified d/c address: [address] [place], [place] [address]; pt also says her family is aware of her d/c and are at home to accept her; [facility] called at 59; spoke w/ operator # [phone]; no TOC needs.  Expected Discharge Plan: Home w Home Health Services Barriers to Discharge: No Barriers Identified   Patient Goals and CMS Choice Patient states their goals for this hospitalization and ongoing recovery are:: home   Choice offered to / list presented to : NA      Expected Discharge Plan and Services   Discharge Planning Services: CM Consult Post Acute Care Choice: Resumption of Svcs/PTA Provider, Durable Medical Equipment (wheel chair, hospital bed; [facility] Aides w/ [facility]) Living arrangements for the past 2 months: Apartment Expected Discharge Date: 03/03/23                                    Prior Living Arrangements/Services Living arrangements for the past 2 months: Apartment Lives with:: Adult Children, Spouse Patient language and need for interpreter reviewed:: Yes Do you feel safe going back to the place where you live?: Yes      Need for Family Participation in Patient Care: Yes (Comment) Care giver support system in place?: Yes (comment) Current home services: DME (wheel chair, hospital bed; HH Aides w/ [facility]) Criminal  Activity/Legal Involvement Pertinent to Current Situation/Hospitalization: No - Comment as needed  Activities of Daily Living Home Assistive Devices/Equipment: Wheelchair ADL Screening (condition at time of admission) Patient's cognitive ability adequate to safely complete daily activities?: Yes Is the patient deaf or have difficulty hearing?: No Does the patient have difficulty seeing, even when wearing glasses/contacts?: No Does the patient have difficulty concentrating, remembering, or making decisions?: No Patient able to express need for assistance with ADLs?: Yes Does the patient have difficulty dressing or bathing?: Yes Independently performs ADLs?: No Communication: Independent Dressing (OT): Dependent Is this a change from baseline?: Pre-admission baseline Grooming: Dependent Is this a change from baseline?: Pre-admission baseline Feeding: Independent Bathing: Dependent Is this a change from baseline?: Pre-admission baseline Toileting: Needs assistance Is this a change from baseline?: Pre-admission baseline In/Out Bed: Dependent Is this a change from baseline?: Pre-admission baseline Walks in Home: Dependent Is this a change from baseline?: Pre-admission baseline Does the patient have difficulty walking or climbing stairs?: Yes Weakness of Legs: Both Weakness of Arms/Hands: None  Permission Sought/Granted Permission sought to share information with : Case Manager Permission granted to share information with : Yes, Verbal Permission Granted  Share Information with NAME: Case Manager     Permission granted to share info w Relationship: Marin Roberts (dtr) [phone]     Emotional Assessment Appearance::  Appears stated age Attitude/Demeanor/Rapport: Gracious Affect (typically observed): Accepting Orientation: : Oriented to Self, Oriented to Place, Oriented to  Time, Oriented to Situation Alcohol / Substance Use: Not Applicable Psych Involvement: No  (comment)  Admission diagnosis:  Vagina bleeding [N93.9] Patient Active Problem List   Diagnosis Date Noted   Hypocalcemia 03/01/2023   Diverticulitis of colon with bleeding 11/07/2022   Ambulatory dysfunction 11/07/2022   Cholelithiasis 11/07/2022   Supratherapeutic INR 09/10/2022   Acute GI bleeding 09/09/2022   Vagina bleeding 11/01/2021   Hyperproteinemia 11/01/2021   Hypoalbuminemia 11/01/2021   Chronic hepatitis C (HCC) 08/25/2021   Essential hypertension 08/25/2021   Diverticulosis of colon 12/15/2020   Acute blood loss anemia on chronic iron deficiency anemia 12/13/2020   GI bleed 12/12/2020   Anemia    Hematochezia    Lower abdominal pain    Atrial fibrillation (HCC) 10/31/2018   Long term (current) use of anticoagulants 10/05/2018   Peripheral edema    Thrombocytopenia (HCC)    Chronic diastolic (congestive) heart failure (HCC) 09/27/2016   Swelling of left lower extremity 09/27/2016   Paroxysmal atrial fibrillation (HCC) 09/27/2016   AKI (acute kidney injury) (HCC) 09/27/2016   Shortness of breath 08/28/2016   Pressure injury of skin 08/28/2016   Morbid obesity due to excess calories (HCC) 06/28/2015   PCP:  Marletta Lor, NP Pharmacy:   [facility] - [place], [place] - [address] [place] [address] Phone: [phone] Fax: [phone]     Social Determinants of Health (SDOH) Social History: SDOH Screenings   Food Insecurity: No Food Insecurity (03/03/2023)  Housing: Low Risk  (03/03/2023)  Transportation Needs: No Transportation Needs (03/03/2023)  Utilities: Not At Risk (03/03/2023)  Depression (PHQ2-9): Low Risk  (11/14/2022)  Tobacco Use: Medium Risk (03/01/2023)   SDOH Interventions: Food Insecurity Interventions: Intervention Not Indicated, Inpatient TOC Housing Interventions: Intervention Not Indicated, Inpatient TOC Transportation Interventions: Intervention Not Indicated, Inpatient TOC Utilities  Interventions: Intervention Not Indicated, Inpatient TOC   Readmission Risk Interventions     No data to display

## 2023-03-03 NOTE — Plan of Care (Signed)

## 2023-03-03 NOTE — Progress Notes (Signed)
Reviewed written d/c instructions w pt and all questions answered, she verbalized understanding. D/C home via [facility] w all belongings in stable condition.

## 2023-03-07 NOTE — Progress Notes (Addendum)
COVID Vaccine received:  []  No [x]  Yes Date of any COVID positive Test in last 90 days: No PCP - Marletta Lor NP Cardiologist - Dr. Jacinto Halim MD  Chest x-ray - 11/01/21 EPIC EKG -  06/15/22 EPIC Stress Test -  ECHO - 08/29/16 EPIC Cardiac Cath -   Bowel Prep - [x]  No  []   Yes ______  Pacemaker / ICD device [x]  No []  Yes   Spinal Cord Stimulator:[x]  No []  Yes       History of Sleep Apnea? [x]  No []  Yes   CPAP used?- [x]  No []  Yes    Does the patient monitor blood sugar?          []  No [x]  Yes  []  N/A  Patient has: []  NO Hx DM   []  Pre-DM                 []  DM1  [x]   DM2 Does patient have a [employer] or Dexacom? [x]  No []  Yes   Fasting Blood Sugar Ranges- pt. Does not know Checks Blood Sugar __1_ time a month  GLP1 agonist / usual dose - No GLP1 instructions: No SGLT-2 inhibitors / usual dose - No SGLT-2 instructions:   Blood Thinner / Instructions:Coumadin Stopped taking 03/03/23 Aspirin Instructions:No  Comments:   Activity level: Patient is  unable to climb a flight of stairs without difficulty; [x]  No CP  []  No SOB, but would have _immobile__   Patient can not perform ADLs without assistance.   Anesthesia review: HTN, CHF,A-fib, Anemia, HepC, Long time use anti coags,MS, recent hospital 03/02/23, Immobile  Patient denies shortness of breath, fever, cough and chest pain at PAT appointment.  Patient verbalized understanding and agreement to the Pre-Surgical Instructions that were given to them at this PAT appointment. Patient was also educated of the need to review these PAT instructions again prior to his/her surgery.I reviewed the appropriate phone numbers to call if they have any and questions or concerns.

## 2023-03-08 NOTE — Patient Instructions (Addendum)
SURGICAL WAITING ROOM VISITATION  Patients having surgery or a procedure may have no more than 2 support people in the waiting area - these visitors may rotate.    Children under the age of 75 must have an adult with them who is not the patient.  Due to an increase in RSV and influenza rates and associated hospitalizations, children ages 54 and under may not visit patients in [facility] hospitals.  If the patient needs to stay at the hospital during part of their recovery, the visitor guidelines for inpatient rooms apply. Pre-op nurse will coordinate an appropriate time for 1 support person to accompany patient in pre-op.  This support person may not rotate.    Please refer to the [facility] website for the visitor guidelines for Inpatients (after your surgery is over and you are in a regular room).       Your procedure is scheduled on: 03/12/23   Report to [facility] Main Entrance    Report to admitting at 10:30 AM   Call this number if you have problems the morning of surgery [phone]   Do not eat food  or drink liquids :After Midnight.      Oral Hygiene is also important to reduce your risk of infection.                                    Remember - BRUSH YOUR TEETH THE MORNING OF SURGERY WITH YOUR REGULAR TOOTHPASTE   Stop all vitamins and herbal supplements 7 days before surgery.   Take these medicines the morning of surgery with A SIP OF WATER: Tylenol if needed, Imdur, Metoprolol, Torsemide  DO NOT TAKE ANY ORAL DIABETIC MEDICATIONS DAY OF YOUR SURGERY             You may not have any metal on your body including hair pins, jewelry, and body piercing             Do not wear make-up, lotions, powders, perfumes, or deodorant  Do not wear nail polish including gel and S&S, artificial/acrylic nails, or any other type of covering on natural nails including finger and toenails. If you have artificial nails, gel coating, etc. that needs to be removed by a nail  salon please have this removed prior to surgery or surgery may need to be canceled/ delayed if the surgeon/ anesthesia feels like they are unable to be safely monitored.   Do not shave  48 hours prior to surgery.    Do not bring valuables to the hospital. [facility] IS NOT             RESPONSIBLE   FOR VALUABLES.   Contacts, glasses, dentures or bridgework may not be worn into surgery.  DO NOT BRING YOUR HOME MEDICATIONS TO THE HOSPITAL. PHARMACY WILL DISPENSE MEDICATIONS LISTED ON YOUR MEDICATION LIST TO YOU DURING YOUR ADMISSION IN THE HOSPITAL!    Patients discharged on the day of surgery will not be allowed to drive home.  Someone NEEDS to stay with you for the first 24 hours after anesthesia.   Special Instructions: Bring a copy of your healthcare power of attorney and living will documents the day of surgery if you haven't scanned them before.              Please read over the following fact sheets you were given: IF YOU HAVE QUESTIONS ABOUT YOUR PRE-OP  INSTRUCTIONS PLEASE CALL [phone] Rosey Bath   If you received a COVID test during your pre-op visit  it is requested that you wear a mask when out in public, stay away from anyone that may not be feeling well and notify your surgeon if you develop symptoms. If you test positive for Covid or have been in contact with anyone that has tested positive in the last 10 days please notify you surgeon.    [facility] - Preparing for Surgery Before surgery, you can play an important role.  Because skin is not sterile, your skin needs to be as free of germs as possible.  You can reduce the number of germs on your skin by washing with CHG (chlorahexidine gluconate) soap before surgery.  CHG is an antiseptic cleaner which kills germs and bonds with the skin to continue killing germs even after washing. Please DO NOT use if you have an allergy to CHG or antibacterial soaps.  If your skin becomes reddened/irritated stop using the CHG and inform your  nurse when you arrive at Short Stay. Do not shave (including legs and underarms) for at least 48 hours prior to the first CHG shower.  You may shave your face/neck.  Please follow these instructions carefully:  1.  Shower with CHG Soap the night before surgery and the  morning of surgery.  2.  If you choose to wash your hair, wash your hair first as usual with your normal  shampoo.  3.  After you shampoo, rinse your hair and body thoroughly to remove the shampoo.                             4.  Use CHG as you would any other liquid soap.  You can apply chg directly to the skin and wash.  Gently with a scrungie or clean washcloth.  5.  Apply the CHG Soap to your body ONLY FROM THE NECK DOWN.   Do   not use on face/ open                           Wound or open sores. Avoid contact with eyes, ears mouth and   genitals (private parts).                       Wash face,  Genitals (private parts) with your normal soap.             6.  Wash thoroughly, paying special attention to the area where your    surgery  will be performed.  7.  Thoroughly rinse your body with warm water from the neck down.  8.  DO NOT shower/wash with your normal soap after using and rinsing off the CHG Soap.                9.  Pat yourself dry with a clean towel.            10.  Wear clean pajamas.            11.  Place clean sheets on your bed the night of your first shower and do not  sleep with pets. Day of Surgery : Do not apply any lotions/deodorants the morning of surgery.  Please wear clean clothes to the hospital/surgery center.  FAILURE TO FOLLOW THESE INSTRUCTIONS MAY RESULT IN THE CANCELLATION OF YOUR SURGERY  PATIENT SIGNATURE_________________________________  NURSE SIGNATURE__________________________________  ________________________________________________________________________How to Manage Your Diabetes Before and After Surgery  Why is it important to control my blood sugar before and after  surgery? Improving blood sugar levels before and after surgery helps healing and can limit problems. A way of improving blood sugar control is eating a healthy diet by:  Eating less sugar and carbohydrates  Increasing activity/exercise  Talking with your doctor about reaching your blood sugar goals High blood sugars (greater than 180 mg/dL) can raise your risk of infections and slow your recovery, so you will need to focus on controlling your diabetes during the weeks before surgery. Make sure that the doctor who takes care of your diabetes knows about your planned surgery including the date and location.  How do I manage my blood sugar before surgery? Check your blood sugar at least 4 times a day, starting 2 days before surgery, to make sure that the level is not too high or low. Check your blood sugar the morning of your surgery when you wake up and every 2 hours until you get to the Short Stay unit. If your blood sugar is less than 70 mg/dL, you will need to treat for low blood sugar: Do not take insulin. Treat a low blood sugar (less than 70 mg/dL) with  cup of clear juice (cranberry or apple), 4 glucose tablets, OR glucose gel. Recheck blood sugar in 15 minutes after treatment (to make sure it is greater than 70 mg/dL). If your blood sugar is not greater than 70 mg/dL on recheck, call [phone] for further instructions. Report your blood sugar to the short stay nurse when you get to Short Stay.  If you are admitted to the hospital after surgery: Your blood sugar will be checked by the staff and you will probably be given insulin after surgery (instead of oral diabetes medicines) to make sure you have good blood sugar levels. The goal for blood sugar control after surgery is 80-180 mg/dL.   WHAT DO I DO ABOUT MY DIABETES MEDICATION?  Do not take oral diabetes medicines (pills) the morning of surgery. HOLD METFORMIN THE DAY OF SURGERY  DO NOT TAKE THE FOLLOWING 7 DAYS PRIOR TO  SURGERY: Ozempic, Wegovy, Rybelsus (Semaglutide), Byetta (exenatide), Bydureon (exenatide ER), Victoza, Saxenda (liraglutide), or Trulicity (dulaglutide) Mounjaro (Tirzepatide) Adlyxin (Lixisenatide), Polyethylene Glycol Loxenatide.  Patient Signature:  Date:   Nurse Signature:  Date:   Reviewed and Endorsed by [facility] Patient Education Committee, August 2015

## 2023-03-08 NOTE — Progress Notes (Signed)
Please send preop orders for PST visit 03/09/23

## 2023-03-09 ENCOUNTER — Encounter (HOSPITAL_COMMUNITY)
Admission: RE | Admit: 2023-03-09 | Discharge: 2023-03-09 | Disposition: A | Discharge status: Home / Self Care | Payer: 59 | Source: Ambulatory Visit | Attending: Obstetrics and Gynecology | Admitting: Obstetrics and Gynecology

## 2023-03-09 ENCOUNTER — Encounter (HOSPITAL_COMMUNITY): Payer: Self-pay

## 2023-03-09 ENCOUNTER — Other Ambulatory Visit: Payer: Self-pay

## 2023-03-09 VITALS — BP 118/76 | HR 98 | Temp 98.3°F | Resp 18 | Ht 62.0 in

## 2023-03-09 DIAGNOSIS — G35 Multiple sclerosis: Secondary | ICD-10-CM | POA: Insufficient documentation

## 2023-03-09 DIAGNOSIS — Z01812 Encounter for preprocedural laboratory examination: Secondary | ICD-10-CM | POA: Diagnosis not present

## 2023-03-09 DIAGNOSIS — E119 Type 2 diabetes mellitus without complications: Secondary | ICD-10-CM | POA: Diagnosis not present

## 2023-03-09 DIAGNOSIS — Z6841 Body Mass Index (BMI) 40.0 and over, adult: Secondary | ICD-10-CM | POA: Insufficient documentation

## 2023-03-09 DIAGNOSIS — N840 Polyp of corpus uteri: Secondary | ICD-10-CM | POA: Diagnosis not present

## 2023-03-09 DIAGNOSIS — Z01818 Encounter for other preprocedural examination: Secondary | ICD-10-CM | POA: Diagnosis present

## 2023-03-09 DIAGNOSIS — I5030 Unspecified diastolic (congestive) heart failure: Secondary | ICD-10-CM | POA: Insufficient documentation

## 2023-03-09 DIAGNOSIS — N950 Postmenopausal bleeding: Secondary | ICD-10-CM | POA: Diagnosis not present

## 2023-03-09 DIAGNOSIS — I110 Hypertensive heart disease with heart failure: Secondary | ICD-10-CM | POA: Insufficient documentation

## 2023-03-09 DIAGNOSIS — I10 Essential (primary) hypertension: Secondary | ICD-10-CM

## 2023-03-09 HISTORY — DX: Unspecified osteoarthritis, unspecified site: M19.90

## 2023-03-09 HISTORY — DX: Type 2 diabetes mellitus without complications: E11.9

## 2023-03-09 LAB — BASIC METABOLIC PANEL
Anion gap: 10 (ref 5–15)
BUN: 16 mg/dL (ref 8–23)
CO2: 22 mmol/L (ref 22–32)
Calcium: 8.3 mg/dL — ABNORMAL LOW (ref 8.9–10.3)
Chloride: 102 mmol/L (ref 98–111)
Creatinine, Ser: 0.7 mg/dL (ref 0.44–1.00)
GFR, Estimated: >60 mL/min (ref >60)
Glucose, Bld: 97 mg/dL (ref 70–99)
Potassium: 3.9 mmol/L (ref 3.5–5.1)
Sodium: 134 mmol/L — ABNORMAL LOW (ref 135–145)

## 2023-03-09 LAB — HEMOGLOBIN A1C
Hgb A1c MFr Bld: 6.9 % — ABNORMAL HIGH (ref 4.8–5.6)
Mean Plasma Glucose: 151.33 mg/dL

## 2023-03-09 LAB — CBC
HCT: 32.6 % — ABNORMAL LOW (ref 36.0–46.0)
Hemoglobin: 9.3 g/dL — ABNORMAL LOW (ref 12.0–15.0)
MCH: 21.2 pg — ABNORMAL LOW (ref 26.0–34.0)
MCHC: 28.5 g/dL — ABNORMAL LOW (ref 30.0–36.0)
MCV: 74.4 fL — ABNORMAL LOW (ref 80.0–100.0)
Platelets: 455 10*3/uL — ABNORMAL HIGH (ref 150–400)
RBC: 4.38 MIL/uL (ref 3.87–5.11)
RDW: 23.8 % — ABNORMAL HIGH (ref 11.5–15.5)
WBC: 8.5 10*3/uL (ref 4.0–10.5)
nRBC: 0 % (ref 0.0–0.2)

## 2023-03-09 LAB — GLUCOSE, CAPILLARY: Glucose-Capillary: 99 mg/dL (ref 70–99)

## 2023-03-09 NOTE — Anesthesia Preprocedure Evaluation (Addendum)
Anesthesia Evaluation  Patient identified by MRN, date of birth, ID band Patient awake    Reviewed: Allergy & Precautions, NPO status , Patient's Chart, lab work & pertinent test results  Airway Mallampati: III  TM Distance: >3 FB Neck ROM: Full    Dental  (+) Dental Advisory Given, Poor Dentition, Chipped, Missing   Pulmonary former smoker   Pulmonary exam normal breath sounds clear to auscultation       Cardiovascular hypertension, +CHF  Normal cardiovascular exam+ dysrhythmias Atrial Fibrillation  Rhythm:Regular Rate:Normal     Neuro/Psych Multiple sclerosis     GI/Hepatic negative GI ROS,,,(+) Hepatitis -, C  Endo/Other  diabetes  Morbid obesity (BMI 84)  Renal/GU Renal disease     Musculoskeletal  (+) Arthritis ,    Abdominal   Peds  Hematology  (+) Blood dyscrasia (Coumadin)   Anesthesia Other Findings Day of surgery medications reviewed with the patient.  Reproductive/Obstetrics                              Anesthesia Physical Anesthesia Plan  ASA: 4  Anesthesia Plan: General   Post-op Pain Management: Tylenol PO (pre-op)*   Induction: Intravenous  PONV Risk Score and Plan: 4 or greater and Dexamethasone and Ondansetron  Airway Management Planned: Oral ETT and Video Laryngoscope Planned  Additional Equipment:   Intra-op Plan:   Post-operative Plan: Extubation in OR  Informed Consent: I have reviewed the patients History and Physical, chart, labs and discussed the procedure including the risks, benefits and alternatives for the proposed anesthesia with the patient or authorized representative who has indicated his/her understanding and acceptance.     Dental advisory given  Plan Discussed with: CRNA  Anesthesia Plan Comments: (See PAT note from 8/2 by Sherlie Ban PA-C )         Anesthesia Quick Evaluation

## 2023-03-09 NOTE — Progress Notes (Signed)
Case: [id number] Date/Time: 03/12/23 1230   Procedure: DILATATION AND CURETTAGE /HYSTEROSCOPY   Anesthesia type: Choice   Pre-op diagnosis:      PMB     Polyp   Location: [facility] ROOM 07 / [facility]   Surgeons: Lorriane Shire, MD       DISCUSSION: Zoe Cobb is a 67 yo female who presents to PAT prior to surgery above. PMH significant for former smoking, HTN, HFpEF, morbid obesity, A.fib on Coumadin, MS, and diabetes. Patient presented to the ED on 03/01/2023 for vaginal bleeding.  She was found to have a hemoglobin of 7.7.  Transabdominal ultrasound showed uterine fibroid.  Her anticoagulation was held and she was given a blood transfusion.  She was discharged on 03/03/2023.  Hemoglobin on PAT labs was 9.3.  No prior anesthesia complications.  Hospitalist noted that on exam she appeared euvolemic.  She also was in sinus rhythm and is on a beta-blocker.  Her Coumadin will continue to be held until after surgery.  VS: BP 118/76   Pulse 98   Temp 36.8 C (Oral)   Resp 18   Ht 5\' 2"  (1.575 m)   SpO2 96%   BMI 84.39 kg/m   PROVIDERS: Marletta Lor, NP   LABS: Labs reviewed: Acceptable for surgery. (all labs ordered are listed, but only abnormal results are displayed)  Labs Reviewed  BASIC METABOLIC PANEL - Abnormal; Notable for the following components:      Result Value   Sodium 134 (*)    Calcium 8.3 (*)    All other components within normal limits  CBC - Abnormal; Notable for the following components:   Hemoglobin 9.3 (*)    HCT 32.6 (*)    MCV 74.4 (*)    MCH 21.2 (*)    MCHC 28.5 (*)    RDW 23.8 (*)    Platelets 455 (*)    All other components within normal limits  GLUCOSE, CAPILLARY  HEMOGLOBIN A1C     IMAGES:  Abdominal US 03/01/23:  IMPRESSION: *Limited exam.  Probable uterine leiomyoma. *Markedly limited evaluation of endometrium. *Nonvisualization of bilateral ovaries.   EKG:   CV:  Echo 08/29/2016:  Study Conclusions   - Left ventricle: The  cavity size was mildly dilated. Wall    thickness was increased in a pattern of mild LVH. Systolic    function was normal. The estimated ejection fraction was in the    range of 55% to 60%. Wall motion was normal; there were no    regional wall motion abnormalities. Features are consistent with    a pseudonormal left ventricular filling pattern, with concomitant    abnormal relaxation and increased filling pressure (grade 2    diastolic dysfunction).  - Mitral valve: Calcified annulus.  - Left atrium: The atrium was moderately dilated.  - Right atrium: The atrium was mildly dilated.  - Pulmonary arteries: Systolic pressure was mildly increased. PA    peak pressure: 42 mm Hg (S).   Impressions:   - Technically difficult; definity used; normal LV systolic    function; grade 2 diastolic dysfunction; moderate LAE; mild RAE;    mild TR with mildly elevated pulmonary pressure.   Past Medical History:  Diagnosis Date   Arthritis    CHF (congestive heart failure) (HCC)    Diabetes mellitus without complication (HCC)    Dysrhythmia    A-fib   Hypertension    Morbid obesity (HCC)    Multiple sclerosis (HCC)    Paroxysmal atrial  fibrillation [facility])     Past Surgical History:  Procedure Laterality Date   COLONOSCOPY WITH PROPOFOL N/A 12/15/2020   Procedure: COLONOSCOPY WITH PROPOFOL;  Surgeon: Meryl Dare, MD;  Location: [facility] ENDOSCOPY;  Service: Endoscopy;  Laterality: N/A;    MEDICATIONS:  acetaminophen (TYLENOL) 500 MG tablet   cyanocobalamin (VITAMIN B12) 500 MCG tablet   ferrous sulfate 325 (65 FE) MG EC tablet   isosorbide mononitrate (IMDUR) 60 MG 24 hr tablet   metformin (FORTAMET) 1000 MG (OSM) 24 hr tablet   metoprolol succinate (TOPROL-XL) 50 MG 24 hr tablet   Multiple Vitamins-Minerals (ONE A DAY WOMEN 50 PLUS PO)   torsemide (DEMADEX) 20 MG tablet   [START ON 03/13/2023] warfarin (COUMADIN) 2.5 MG tablet   [START ON 03/13/2023] warfarin (COUMADIN) 5 MG tablet   No  current facility-administered medications for this encounter.   Marcille Blanco [facility] Short Stay/Anesthesiology [facility] Phone [phone] 03/09/2023 3:00 PM

## 2023-03-12 ENCOUNTER — Encounter (HOSPITAL_COMMUNITY)
Admission: RE | Disposition: A | Discharge status: Home / Self Care | Payer: Self-pay | Source: Ambulatory Visit | Attending: Obstetrics and Gynecology

## 2023-03-12 ENCOUNTER — Other Ambulatory Visit: Payer: Self-pay

## 2023-03-12 ENCOUNTER — Ambulatory Visit (HOSPITAL_BASED_OUTPATIENT_CLINIC_OR_DEPARTMENT_OTHER): Payer: 59 | Admitting: Certified Registered Nurse Anesthetist

## 2023-03-12 ENCOUNTER — Ambulatory Visit (HOSPITAL_COMMUNITY): Payer: 59 | Admitting: Medical

## 2023-03-12 ENCOUNTER — Ambulatory Visit (HOSPITAL_COMMUNITY)
Admission: RE | Admit: 2023-03-12 | Discharge: 2023-03-12 | Disposition: A | Discharge status: Home / Self Care | Payer: 59 | Source: Ambulatory Visit | Attending: Obstetrics and Gynecology | Admitting: Obstetrics and Gynecology

## 2023-03-12 ENCOUNTER — Encounter (HOSPITAL_COMMUNITY): Payer: Self-pay | Admitting: Obstetrics and Gynecology

## 2023-03-12 DIAGNOSIS — Z7984 Long term (current) use of oral hypoglycemic drugs: Secondary | ICD-10-CM | POA: Insufficient documentation

## 2023-03-12 DIAGNOSIS — Z87891 Personal history of nicotine dependence: Secondary | ICD-10-CM | POA: Diagnosis not present

## 2023-03-12 DIAGNOSIS — Z7901 Long term (current) use of anticoagulants: Secondary | ICD-10-CM | POA: Diagnosis not present

## 2023-03-12 DIAGNOSIS — N840 Polyp of corpus uteri: Secondary | ICD-10-CM | POA: Diagnosis not present

## 2023-03-12 DIAGNOSIS — I110 Hypertensive heart disease with heart failure: Secondary | ICD-10-CM | POA: Insufficient documentation

## 2023-03-12 DIAGNOSIS — C541 Malignant neoplasm of endometrium: Secondary | ICD-10-CM | POA: Insufficient documentation

## 2023-03-12 DIAGNOSIS — I480 Paroxysmal atrial fibrillation: Secondary | ICD-10-CM

## 2023-03-12 DIAGNOSIS — I509 Heart failure, unspecified: Secondary | ICD-10-CM | POA: Insufficient documentation

## 2023-03-12 DIAGNOSIS — Z6841 Body Mass Index (BMI) 40.0 and over, adult: Secondary | ICD-10-CM | POA: Insufficient documentation

## 2023-03-12 DIAGNOSIS — N950 Postmenopausal bleeding: Secondary | ICD-10-CM

## 2023-03-12 DIAGNOSIS — E119 Type 2 diabetes mellitus without complications: Secondary | ICD-10-CM | POA: Insufficient documentation

## 2023-03-12 DIAGNOSIS — I5032 Chronic diastolic (congestive) heart failure: Secondary | ICD-10-CM | POA: Diagnosis not present

## 2023-03-12 DIAGNOSIS — N939 Abnormal uterine and vaginal bleeding, unspecified: Secondary | ICD-10-CM

## 2023-03-12 HISTORY — PX: HYSTEROSCOPY WITH D & C: SHX1775

## 2023-03-12 LAB — GLUCOSE, CAPILLARY
Glucose-Capillary: 102 mg/dL — ABNORMAL HIGH (ref 70–99)
Glucose-Capillary: 98 mg/dL (ref 70–99)

## 2023-03-12 SURGERY — DILATATION AND CURETTAGE /HYSTEROSCOPY
Anesthesia: General

## 2023-03-12 MED ORDER — MIDAZOLAM HCL 5 MG/5ML IJ SOLN
INTRAMUSCULAR | Status: DC | PRN
  Administered 2023-03-12: 2 mg via INTRAVENOUS

## 2023-03-12 MED ORDER — ROCURONIUM BROMIDE 10 MG/ML (PF) SYRINGE
PREFILLED_SYRINGE | INTRAVENOUS | Status: AC
  Filled 2023-03-12: qty 10

## 2023-03-12 MED ORDER — FENTANYL CITRATE (PF) 100 MCG/2ML IJ SOLN
INTRAMUSCULAR | Status: DC | PRN
  Administered 2023-03-12: 25 ug via INTRAVENOUS
  Administered 2023-03-12: 50 ug via INTRAVENOUS
  Administered 2023-03-12: 25 ug via INTRAVENOUS

## 2023-03-12 MED ORDER — SUGAMMADEX SODIUM 200 MG/2ML IV SOLN
INTRAVENOUS | Status: DC | PRN
  Administered 2023-03-12: 200 mg via INTRAVENOUS

## 2023-03-12 MED ORDER — LIDOCAINE 2% (20 MG/ML) 5 ML SYRINGE
INTRAMUSCULAR | Status: DC | PRN
  Administered 2023-03-12: 100 mg via INTRAVENOUS

## 2023-03-12 MED ORDER — ROCURONIUM BROMIDE 10 MG/ML (PF) SYRINGE
PREFILLED_SYRINGE | INTRAVENOUS | Status: DC | PRN
  Administered 2023-03-12: 40 mg via INTRAVENOUS

## 2023-03-12 MED ORDER — METOPROLOL SUCCINATE ER 25 MG PO TB24
50.0000 mg | ORAL_TABLET | Freq: Once | ORAL | Status: AC
  Administered 2023-03-12: 50 mg via ORAL

## 2023-03-12 MED ORDER — PROPOFOL 10 MG/ML IV BOLUS
INTRAVENOUS | Status: DC | PRN
Start: 2023-03-12 — End: 2023-03-12
  Administered 2023-03-12: 170 mg via INTRAVENOUS

## 2023-03-12 MED ORDER — ONDANSETRON HCL 4 MG/2ML IJ SOLN: 4.0000 mg | Freq: Once | INTRAMUSCULAR | Status: DC | PRN

## 2023-03-12 MED ORDER — ONDANSETRON HCL 4 MG/2ML IJ SOLN
INTRAMUSCULAR | Status: AC
  Filled 2023-03-12: qty 2

## 2023-03-12 MED ORDER — LIDOCAINE HCL URETHRAL/MUCOSAL 2 % EX GEL
CUTANEOUS | Status: DC | PRN
  Administered 2023-03-12: 1

## 2023-03-12 MED ORDER — SODIUM CHLORIDE 0.9 % IR SOLN
Status: DC | PRN
  Administered 2023-03-12: 6000 mL

## 2023-03-12 MED ORDER — METOPROLOL SUCCINATE ER 25 MG PO TB24
ORAL_TABLET | ORAL | Status: AC
  Filled 2023-03-12: qty 2

## 2023-03-12 MED ORDER — SUCCINYLCHOLINE CHLORIDE 200 MG/10ML IV SOSY
PREFILLED_SYRINGE | INTRAVENOUS | Status: DC | PRN
  Administered 2023-03-12: 160 mg via INTRAVENOUS

## 2023-03-12 MED ORDER — LEVONORGESTREL 20 MCG/DAY IU IUD
1.0000 | INTRAUTERINE_SYSTEM | INTRAUTERINE | Status: DC
  Filled 2023-03-12: qty 1

## 2023-03-12 MED ORDER — LACTATED RINGERS IV SOLN: INTRAVENOUS | Status: DC

## 2023-03-12 MED ORDER — LIDOCAINE HCL (PF) 2 % IJ SOLN
INTRAMUSCULAR | Status: AC
  Filled 2023-03-12: qty 5

## 2023-03-12 MED ORDER — ACETAMINOPHEN 500 MG PO TABS
1000.0000 mg | ORAL_TABLET | ORAL | Status: AC
  Administered 2023-03-12: 1000 mg via ORAL
  Filled 2023-03-12: qty 2

## 2023-03-12 MED ORDER — INSULIN ASPART 100 UNIT/ML IJ SOLN: 0.0000 [IU] | INTRAMUSCULAR | Status: DC | PRN

## 2023-03-12 MED ORDER — CHLORHEXIDINE GLUCONATE 0.12 % MT SOLN
15.0000 mL | Freq: Once | OROMUCOSAL | Status: AC
  Administered 2023-03-12: 15 mL via OROMUCOSAL

## 2023-03-12 MED ORDER — MIDAZOLAM HCL 2 MG/2ML IJ SOLN
INTRAMUSCULAR | Status: AC
  Filled 2023-03-12: qty 2

## 2023-03-12 MED ORDER — ORAL CARE MOUTH RINSE: 15.0000 mL | Freq: Once | OROMUCOSAL | Status: AC

## 2023-03-12 MED ORDER — PHENYLEPHRINE 80 MCG/ML (10ML) SYRINGE FOR IV PUSH (FOR BLOOD PRESSURE SUPPORT)
PREFILLED_SYRINGE | INTRAVENOUS | Status: DC | PRN
Start: 2023-03-12 — End: 2023-03-12
  Administered 2023-03-12: 240 ug via INTRAVENOUS

## 2023-03-12 MED ORDER — DEXAMETHASONE SODIUM PHOSPHATE 10 MG/ML IJ SOLN
INTRAMUSCULAR | Status: AC
  Filled 2023-03-12: qty 1

## 2023-03-12 MED ORDER — PHENYLEPHRINE HCL-NACL 20-0.9 MG/250ML-% IV SOLN
INTRAVENOUS | Status: DC | PRN
  Administered 2023-03-12: 35 ug/min via INTRAVENOUS

## 2023-03-12 MED ORDER — BUPIVACAINE HCL (PF) 0.5 % IJ SOLN
INTRAMUSCULAR | Status: AC
  Filled 2023-03-12: qty 30

## 2023-03-12 MED ORDER — LIDOCAINE HCL URETHRAL/MUCOSAL 2 % EX GEL
CUTANEOUS | Status: AC
  Filled 2023-03-12: qty 30

## 2023-03-12 MED ORDER — FENTANYL CITRATE (PF) 100 MCG/2ML IJ SOLN
INTRAMUSCULAR | Status: AC
  Filled 2023-03-12: qty 2

## 2023-03-12 MED ORDER — ONDANSETRON HCL 4 MG/2ML IJ SOLN
INTRAMUSCULAR | Status: DC | PRN
Start: 2023-03-12 — End: 2023-03-12
  Administered 2023-03-12: 4 mg via INTRAVENOUS

## 2023-03-12 MED ORDER — PROPOFOL 10 MG/ML IV BOLUS
INTRAVENOUS | Status: AC
  Filled 2023-03-12: qty 20

## 2023-03-12 MED ORDER — FENTANYL CITRATE PF 50 MCG/ML IJ SOSY: 25.0000 ug | PREFILLED_SYRINGE | INTRAMUSCULAR | Status: DC | PRN

## 2023-03-12 MED ORDER — DEXAMETHASONE SODIUM PHOSPHATE 10 MG/ML IJ SOLN
INTRAMUSCULAR | Status: DC | PRN
  Administered 2023-03-12: 10 mg via INTRAVENOUS

## 2023-03-12 SURGICAL SUPPLY — 18 items
CATH ROBINSON RED A/P 16FR (CATHETERS) IMPLANT
DEVICE MYOSURE LITE (MISCELLANEOUS) IMPLANT
DEVICE MYOSURE REACH (MISCELLANEOUS) IMPLANT
GAUZE 4X4 16PLY ~~LOC~~+RFID DBL (SPONGE) IMPLANT
GAUZE PAD ABD 8X10 STRL (GAUZE/BANDAGES/DRESSINGS) IMPLANT
GLOVE BIO SURGEON STRL SZ7 (GLOVE) ×1 IMPLANT
GLOVE BIOGEL PI IND STRL 7.0 (GLOVE) ×1 IMPLANT
GOWN STRL REUS W/ TWL XL LVL3 (GOWN DISPOSABLE) IMPLANT
GOWN STRL REUS W/TWL XL LVL3 (GOWN DISPOSABLE)
KIT PROCEDURE FLUENT (KITS) ×1 IMPLANT
KIT TURNOVER KIT A (KITS) IMPLANT
MYOSURE XL FIBROID (MISCELLANEOUS)
PACK VAGINAL MINOR WOMEN LF (CUSTOM PROCEDURE TRAY) ×1 IMPLANT
PAD OB MATERNITY 4.3X12.25 (PERSONAL CARE ITEMS) ×1 IMPLANT
SEAL CERVICAL OMNI LOK (ABLATOR) IMPLANT
SEAL ROD LENS SCOPE MYOSURE (ABLATOR) ×1 IMPLANT
SLEEVE SCD COMPRESS KNEE MED (STOCKING) ×1 IMPLANT
SYSTEM TISS REMOVAL MYOSURE XL (MISCELLANEOUS) IMPLANT

## 2023-03-12 NOTE — H&P (Signed)
OB/GYN Pre-Op History and Physical  Zoe Cobb is a 67 y.o. No obstetric history on file. presenting for postmenopausal bleeding.       Past Medical History:  Diagnosis Date   Arthritis    CHF (congestive heart failure) (HCC)    Diabetes mellitus without complication (HCC)    Dysrhythmia    A-fib   Hypertension    Morbid obesity (HCC)    Multiple sclerosis (HCC)    Paroxysmal atrial fibrillation (HCC)     Past Surgical History:  Procedure Laterality Date   COLONOSCOPY WITH PROPOFOL N/A 12/15/2020   Procedure: COLONOSCOPY WITH PROPOFOL;  Surgeon: Meryl Dare, MD;  Location: [facility] ENDOSCOPY;  Service: Endoscopy;  Laterality: N/A;    OB History  No obstetric history on file.    Social History   Socioeconomic History   Marital status: Married    Spouse name: Not on file   Number of children: 2   Years of education: Not on file   Highest education level: Not on file  Occupational History   Not on file  Tobacco Use   Smoking status: Former    Current packs/day: 0.00    Average packs/day: 1 pack/day for 9.0 years (9.0 ttl pk-yrs)    Types: Cigarettes    Start date: 33    Quit date: 73    Years since quitting: 28.6   Smokeless tobacco: Never  Vaping Use   Vaping status: Never Used  Substance and Sexual Activity   Alcohol use: No    Alcohol/week: 0.0 standard drinks of alcohol   Drug use: No   Sexual activity: Not on file  Other Topics Concern   Not on file  Social History Narrative   Pt lives by herself, she completed hs.    Social Determinants of Health   Financial Resource Strain: Not on file  Food Insecurity: No Food Insecurity (03/03/2023)   Hunger Vital Sign    Worried About Running Out of Food in the Last Year: Never true    Ran Out of Food in the Last Year: Never true  Transportation Needs: No Transportation Needs (03/03/2023)   PRAPARE - Administrator, Civil Service (Medical): No    Lack of Transportation (Non-Medical): No   Physical Activity: Not on file  Stress: Not on file  Social Connections: Not on file    Family History  Problem Relation Age of Onset   Hypertension Mother    Diabetes Mother    Stomach cancer Father    Kidney disease Sister    Diabetes Sister    Diabetes Sister    Diabetes Sister    Heart attack Brother    HIV/AIDS Brother    Diabetes Brother     Medications Prior to Admission  Medication Sig Dispense Refill Last Dose   acetaminophen (TYLENOL) 500 MG tablet Take 500 mg by mouth every 6 (six) hours as needed for moderate pain, mild pain, fever or headache.      cyanocobalamin (VITAMIN B12) 500 MCG tablet Take 1 tablet (500 mcg total) by mouth daily. 30 tablet 1    ferrous sulfate 325 (65 FE) MG EC tablet Take 1 tablet (325 mg total) by mouth 2 (two) times daily. (Patient taking differently: Take 325 mg by mouth daily with breakfast.) 60 tablet 3    isosorbide mononitrate (IMDUR) 60 MG 24 hr tablet Take 60 mg by mouth daily.      metformin (FORTAMET) 1000 MG (OSM) 24 hr tablet Take 1,000  mg by mouth 2 (two) times daily with a meal.      metoprolol succinate (TOPROL-XL) 50 MG 24 hr tablet Take 50 mg by mouth daily. Take with or immediately following a meal.      Multiple Vitamins-Minerals (ONE A DAY WOMEN 50 PLUS PO) Take 1 tablet by mouth daily.      torsemide (DEMADEX) 20 MG tablet Take 20 mg by mouth 2 (two) times daily.      [START ON 03/13/2023] warfarin (COUMADIN) 2.5 MG tablet Take 1 tablet (2.5 mg total) by mouth daily. Take with 1 mg tab to equal 3.5 mg daily dose      [START ON 03/13/2023] warfarin (COUMADIN) 5 MG tablet Take 1 tablet (5 mg total) by mouth See admin instructions. On Tues, and Thurs       Allergies  Allergen Reactions   Penicillins Swelling    Has patient had a PCN reaction causing immediate rash, facial/tongue/throat swelling, SOB or lightheadedness with hypotension: Yes Has patient had a PCN reaction causing severe rash involving mucus membranes or skin  necrosis: No Has patient had a PCN reaction that required hospitalization No Has patient had a PCN reaction occurring within the last 10 years: No If all of the above answers are "NO", then may proceed with Cephalosporin use.     Review of Systems: Negative except for what is mentioned in HPI.     Physical Exam: There were no vitals taken for this visit. CONSTITUTIONAL: Well-developed, well-nourished female in no acute distress.  HENT:  Normocephalic, atraumatic, External right and left ear normal. Oropharynx is clear and moist EYES: Conjunctivae and EOM are normal. Pupils are equal, round, and reactive to light. No scleral icterus.  SKIN: Skin is warm and dry. No rash noted. Not diaphoretic. No erythema. No pallor. NEUROLGIC: Alert and oriented to person, place, and time. Normal reflexes, muscle tone coordination. No cranial nerve deficit noted. PSYCHIATRIC: Normal mood and affect. Normal behavior. Normal judgment and thought content. RESPIRATORY: normal effort PELVIC: Deferred    Pertinent Labs/Studies:   Results for orders placed or performed during the hospital encounter of 03/09/23 (from the past 72 hour(s))  Glucose, capillary     Status: None   Collection Time: 03/09/23  1:18 PM  Result Value Ref Range   Glucose-Capillary 99 70 - 99 mg/dL    Comment: Glucose reference range applies only to samples taken after fasting for at least 8 hours.  Hemoglobin A1c per protocol     Status: Abnormal   Collection Time: 03/09/23  1:28 PM  Result Value Ref Range   Hgb A1c MFr Bld 6.9 (H) 4.8 - 5.6 %    Comment: (NOTE) Pre diabetes:          5.7%-6.4%  Diabetes:              >6.4%  Glycemic control for   <7.0% adults with diabetes    Mean Plasma Glucose 151.33 mg/dL    Comment: Performed at [facility], [address], [place], [place] [address]  Basic metabolic panel per protocol     Status: Abnormal   Collection Time: 03/09/23  1:28 PM  Result Value Ref Range   Sodium  134 (L) 135 - 145 mmol/L   Potassium 3.9 3.5 - 5.1 mmol/L   Chloride 102 98 - 111 mmol/L   CO2 22 22 - 32 mmol/L   Glucose, Bld 97 70 - 99 mg/dL    Comment: Glucose reference range applies only  to samples taken after fasting for at least 8 hours.   BUN 16 8 - 23 mg/dL   Creatinine, Ser 7.82 0.44 - 1.00 mg/dL   Calcium 8.3 (L) 8.9 - 10.3 mg/dL   GFR, Estimated >95 >62 mL/min    Comment: (NOTE) Calculated using the CKD-EPI Creatinine Equation (2021)    Anion gap 10 5 - 15    Comment: Performed at [facility], [address], [place], [place] [address]  CBC per protocol     Status: Abnormal   Collection Time: 03/09/23  1:28 PM  Result Value Ref Range   WBC 8.5 4.0 - 10.5 K/uL   RBC 4.38 3.87 - 5.11 MIL/uL   Hemoglobin 9.3 (L) 12.0 - 15.0 g/dL   HCT 57.8 (L) 46.9 - 62.9 %   MCV 74.4 (L) 80.0 - 100.0 fL   MCH 21.2 (L) 26.0 - 34.0 pg   MCHC 28.5 (L) 30.0 - 36.0 g/dL   RDW 52.8 (H) 41.3 - 24.4 %   Platelets 455 (H) 150 - 400 K/uL   nRBC 0.0 0.0 - 0.2 %    Comment: Performed at [facility], [address], [place], [place] [address]       Assessment and Plan :Zoe Cobb is a 67 y.o. No obstetric history on file. here for hysteroscopy, D&C and IUD insertion for PMB.       Lorriane Shire, M.D. Minimally Invasive Gynecologic Surgery and Pelvic Pain Specialist Attending Obstetrician & Gynecologist, [facility] for [employer], [facility]

## 2023-03-12 NOTE — Transfer of Care (Signed)
Immediate Anesthesia Transfer of Care Note  Patient: Zoe Cobb  Procedure(s) Performed: DIAGNOSTIC HYSTEROSCOPY, ENDOMETRIAL BIOPSY  Patient Location: PACU  Anesthesia Type:General  Level of Consciousness: awake, alert , and oriented  Airway & Oxygen Therapy: Patient Spontanous Breathing and Patient connected to face mask oxygen  Post-op Assessment: Report given to RN and Post -op Vital signs reviewed and stable  Post vital signs: Reviewed and stable  Last Vitals:  Vitals Value Taken Time  BP 117/60 03/12/23 1322  Temp    Pulse 110 03/12/23 1325  Resp 14 03/12/23 1325  SpO2 100 % 03/12/23 1325  Vitals shown include unfiled device data.  Last Pain:  Vitals:   03/12/23 1100  TempSrc: Oral  PainSc:          Complications: No notable events documented.

## 2023-03-12 NOTE — Brief Op Note (Signed)
03/12/2023  1:24 PM  PATIENT:  Zoe Cobb  67 y.o. female  PRE-OPERATIVE DIAGNOSIS:  PMB   POST-OPERATIVE DIAGNOSIS:  PMB  PROCEDURE:  Procedure(s): DIAGNOSTIC HYSTEROSCOPY, ENDOMETRIAL BIOPSY (N/A)  SURGEON:  Surgeons and Role:    Lorriane Shire, MD - Primary    * Lennart Pall, MD - Assisting  PHYSICIAN ASSISTANT: n/a  ASSISTANTSBerton Lan, MD   ANESTHESIA:   general  EBL:  20 mL   BLOOD ADMINISTERED:none  DRAINS: none   LOCAL MEDICATIONS USED:  OTHER topical lidocaine  SPECIMEN:  Source of Specimen:  endometrial biopsy  DISPOSITION OF SPECIMEN:  PATHOLOGY  COUNTS:  YES  TOURNIQUET:  * No tourniquets in log *  DICTATION: .Note written in EPIC  PLAN OF CARE: Discharge to home after PACU  PATIENT DISPOSITION:  PACU - hemodynamically stable.   Delay start of Pharmacological VTE agent (>24hrs) due to surgical blood loss or risk of bleeding: no

## 2023-03-12 NOTE — Anesthesia Procedure Notes (Signed)
Procedure Name: Intubation Date/Time: 03/12/2023 12:16 PM  Performed by: Orest Dikes, CRNAPre-anesthesia Checklist: Patient identified, Emergency Drugs available, Suction available and Patient being monitored Patient Re-evaluated:Patient Re-evaluated prior to induction Oxygen Delivery Method: Circle system utilized Preoxygenation: Pre-oxygenation with 100% oxygen Induction Type: IV induction Ventilation: Mask ventilation without difficulty Laryngoscope Size: Glidescope and 4 Grade View: Grade I Tube type: Oral Tube size: 7.0 mm Number of attempts: 1 Airway Equipment and Method: Stylet Placement Confirmation: ETT inserted through vocal cords under direct vision, positive ETCO2 and breath sounds checked- equal and bilateral Secured at: 21 cm Tube secured with: Tape Dental Injury: Teeth and Oropharynx as per pre-operative assessment  Comments: Elective Glidescope intubation due to patient size. Easy mask-no airway needed. DL x 1 with Glidescope 4 and grade 1 view. ETT passed with ease.

## 2023-03-12 NOTE — Op Note (Signed)
Adeleine Lacasse PROCEDURE DATE: 03/12/2023  PREOPERATIVE DIAGNOSIS: postmenopausal bleeding  POSTOPERATIVE DIAGNOSIS: postmenopausal bleeding PROCEDURE:   diagnostic hysteroscopy, endometrial biopsy SURGEON: Lorriane Shire, MD ASSISTANT:  Harvie Bridge, MD    An experienced assistant was required given the standard of surgical care given the complexity of the case.  This assistant was needed for exposure, dissection, suctioning, retraction, instrument exchange, and for overall help during the procedure.  INDICATIONS: 67 y.o. No obstetric history on file. with postmenopausal bleeding.  Risks of surgery were discussed with the patient including but not limited to: bleeding which may require transfusion; infection which may require antibiotics; injury to surrounding organs; need for additional procedures including laparotomy;  and other postoperative/anesthesia complications. Written informed consent was obtained.    FINDINGS:  Normal external genitalia, atrophic vaginal introitus, normal appearing urethral orifice Normal anterior lip of cervix, on bimnaual dilated cervical os with abundant friable tissue. On attempted hysteroscopy, abundant tissue visualized, bilateral tubal ostia not seen   ANESTHESIA: General,  INTRAVENOUS FLUIDS:  500 ml of LR ESTIMATED BLOOD LOSS:  20 ml URINE OUTPUT: 200 ml SPECIMENS: endometrial biopsy FLUID DEFICIT: ~1500 ml normal saline but note that ~1200cc spilled onto the floor and under the patient COMPLICATIONS:  None immediate.   Procedure: The patient was taken to the operating room where spinal analgesia was inserted. SCDs were in place.  Time out was performed. Patient was placed in dorsolithotomy in [place] stirrups. She was prepped and draped in the usual sterile fashion. A Red Rubber catheter was used to drain her bladder. A speculum was placed in the vagina. The cervix was difficult to visualize due to vaginal wall redundancy and high cervix. A glove  was placed on the speculum and what appeared to the cervix was grasped anteriorly. Paracervical block was not administered due to inadequate visualization of the cervicovaginal junction. Hysteroscope was introduced into the vagina and vaginoscopy was performed and eventually the cervix appeared to be entered as the visualized tissue appeared proliferative and friable. The hysteroscope was removed as well as the speculum and tenaculum and bimanual exam noted the dilated cervix. Polyp forceps were used to remove tissue from the dilated cervix. Tissue was passed off. Attempted bimanual massage performed. There was small, hemostatic abrasion noted at the posterior fourchette. Lidocaine jelly was instilled into the vagina after all instruments were removed from the vagina. All instrument, needle and lap counts were correct x2. The patient was awakened and is recovering in stable condition.   Lorriane Shire, MD Minimally Invasive Gynecologic Surgery and Chronic Pelvic Pain Specialist Obstetrics and Gynecology, [facility] for [facility], [facility] 03/12/2023

## 2023-03-12 NOTE — Anesthesia Postprocedure Evaluation (Signed)
Anesthesia Post Note  Patient: Zoe Cobb  Procedure(s) Performed: DIAGNOSTIC HYSTEROSCOPY, ENDOMETRIAL BIOPSY     Patient location during evaluation: PACU Anesthesia Type: General Level of consciousness: awake and alert Pain management: pain level controlled Vital Signs Assessment: post-procedure vital signs reviewed and stable Respiratory status: spontaneous breathing, nonlabored ventilation, respiratory function stable and patient connected to nasal cannula oxygen Cardiovascular status: blood pressure returned to baseline and stable Postop Assessment: no apparent nausea or vomiting Anesthetic complications: no   No notable events documented.  Last Vitals:  Vitals:   03/12/23 1345 03/12/23 1518  BP: 115/78 (!) 118/98  Pulse: (!) 111 (!) 112  Resp: 18 17  Temp: (!) 36.3 C 36.4 C  SpO2: 93% 94%    Last Pain:  Vitals:   03/12/23 1518  TempSrc: Oral  PainSc: 0-No pain                 Collene Schlichter

## 2023-03-12 NOTE — Discharge Instructions (Addendum)
Post-surgical Instructions, Outpatient Surgery  You may expect to feel dizzy, weak, and drowsy for as long as 24 hours after receiving the medicine that made you sleep (anesthetic). For the first 24 hours after your surgery:   Do not drive a car, ride a bicycle, participate in physical activities, or take public transportation until you are done taking narcotic pain medicines or as directed by Dr. Briscoe Deutscher.  Do not drink alcohol or take tranquilizers.  Do not take medicine that has not been prescribed by your physicians.  Do not sign important papers or make important decisions while on narcotic pain medicines.  Have a responsible person with you.   PAIN MANAGEMENT Ibuprofen 800mg .  (This is the same as 4-200mg  over the counter tablets of Motrin or ibuprofen.)  Take this every 6 hours or as needed for cramping.   Acetaminophen 1000mg  (This is the same as 2-500mg  over the counter extra strength tylenol). Take this every 6 hours for the first 3 days or as needed afterwards for pain   DO'S AND DON'T'S Do not take a tub bath for 1 weeks.  You may shower on the first day after your surgery Do not do any heavy lifting for one to two weeks.  This increases the chance of bleeding. Do move around as you feel able.  Stairs are fine.  You may begin to exercise again as you feel able.  Do not lift any weights for two weeks. Do not put anything in the vagina for two weeks--no tampons, intercourse, or douching.    REGULAR MEDIATIONS/VITAMINS: You may restart all of your regular medications as prescribed. You may restart all of your vitamins as you normally take them.    PLEASE CALL OR SEEK MEDICAL CARE IF: You have persistent nausea and vomiting.  You have trouble eating or drinking.  You have an oral temperature above 100.5.  You have constipation that is not helped by adjusting diet or increasing fluid intake. Pain medicines are a common cause of constipation.  You have heavy vaginal bleeding You  have redness or drainage from your incision(s) or there is increasing pain or tenderness near or in the surgical site.

## 2023-03-13 ENCOUNTER — Telehealth: Payer: Self-pay | Admitting: Obstetrics and Gynecology

## 2023-03-13 ENCOUNTER — Encounter (HOSPITAL_COMMUNITY): Payer: Self-pay | Admitting: Obstetrics and Gynecology

## 2023-03-13 DIAGNOSIS — C55 Malignant neoplasm of uterus, part unspecified: Secondary | ICD-10-CM

## 2023-03-13 NOTE — Telephone Encounter (Signed)
Called patient and confirmed ID [id number]. Notified of pathology demonstrating adenocarcinoma. Will place referral to gyn oncology. All questions answered.    FINAL MICROSCOPIC DIAGNOSIS:   A. 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   Lorriane Shire, MD, FACOG Minimally Invasive Gynecologic Surgery  Obstetrics and Gynecology, [facility] for [facility], [facility] 03/13/2023

## 2023-03-14 ENCOUNTER — Other Ambulatory Visit: Payer: 59

## 2023-03-14 ENCOUNTER — Telehealth: Payer: Self-pay

## 2023-03-14 NOTE — Telephone Encounter (Signed)
I will not sign on this and I have not and will not enroll anyone for home monitoring

## 2023-03-14 NOTE — Telephone Encounter (Signed)
Patient already has a scheduled appointment for 03/19/23 for in clinic INR check.

## 2023-03-14 NOTE — Telephone Encounter (Signed)
[name] from MD INR Patient  representative called to report a result for patient:  Patient had a range of 1.3 today.

## 2023-03-15 ENCOUNTER — Encounter: Payer: Self-pay | Admitting: Gynecologic Oncology

## 2023-03-15 ENCOUNTER — Telehealth: Payer: Self-pay

## 2023-03-15 NOTE — Telephone Encounter (Signed)
Spoke with Zoe Cobb regarding her referral to GYN oncology. She has an appointment scheduled with Dr. Pricilla Holm on 03/23/23 at 9:45. Patient agrees to date and time. She has been provided with office address and location. She is also aware of our mask and visitor policy. Patient verbalized understanding and will call with any questions.

## 2023-03-15 NOTE — Telephone Encounter (Signed)
Left message for patient to call back and schedule new patient visit.  Trying to get her scheduled for 8/16 with Dr Pricilla Holm

## 2023-03-19 ENCOUNTER — Ambulatory Visit: Payer: 59 | Admitting: Cardiology

## 2023-03-19 DIAGNOSIS — Z5181 Encounter for therapeutic drug level monitoring: Secondary | ICD-10-CM

## 2023-03-19 DIAGNOSIS — I480 Paroxysmal atrial fibrillation: Secondary | ICD-10-CM

## 2023-03-19 LAB — POCT INR: INR: 2.2 (ref 2.0–3.0)

## 2023-03-20 NOTE — Progress Notes (Signed)
Description   03/19/2023  INR today was 2.2.  Patient goal level is (2.0-2.5)  Prior dose was 4mg  on Thurs and 2.5 all other days.   Patient will continue the same dose of 4mg  on Thurs and 2.5mg  all other days.   Patient will recheck INR in 1 month on 04/19/23 @ 10:15  Lab Results      Component                Value               Date                      INR                      2.2                 03/19/2023                INR                      3.4 (H)             03/02/2023                INR                      3.6 (H)             03/01/2023            Paroxysmal atrial fibrillation  (primary encounter diagnosis) Plan: POCT INR

## 2023-03-22 NOTE — Progress Notes (Signed)
GYNECOLOGIC ONCOLOGY NEW PATIENT CONSULTATION   Patient Name: Zoe Cobb  Patient Age: 67 y.o. Date of Service: 03/23/23 Referring Provider: Lorriane Shire, MD  Primary Care Provider: Marletta Lor, NP Consulting Provider: Eugene Garnet, MD   Assessment/Plan:  Postmenopausal patient with significant medical comorbidities and recent diagnosis of low-grade endometrioid endometrial adenocarcinoma.  We reviewed the nature of endometrial cancer and its recommended surgical staging, including total hysterectomy, bilateral salpingo-oophorectomy, and lymph node assessment. Given her multiple medical co-morbidities, she is not a suitable surgical candidate at this time.  We discussed other treatment options for endometrial cancer including medical management with hormonal therapy versus radiation therapy.    We reviewed the role of progesterone therapy and the effect on preneoplastic and neoplastic lesions, believed to include induction of apoptosis in addition to tissue sloughing during withdrawal bleeding.  Activation of the progesterone receptors is believed to lead stromal decidualization and thinning of the lining.  We reviewed the 3 most studied options, to include levonorgesterol IUD (10mcg/d), oral medorxyprogesterone acetate, or oral megesterol acetate.    She understands that all options have few side effects, most common being infrequent edema, GI disturbances, and thromboembolic events), but that local progesterone through IUD may have a stronger effect on the endometrium with less systemic side effects.  Given her weight and comorbidities, avoiding side effects related to high-dose progesterone therapy would be beneficial.  For monitoring, she understands that there is no recommend standard of care.  We will base her surveillance on GOG 224 protocol.  This will include EMB at 3 months following initiation of treatment.  EMBs will be performed at 3 to 6 month intervals, until a  minimum of 3 negative biopsy results are obtained, after which sampling frequeny may then be yearly or until new abnormal uterine bleeding develops.  If persistence of low-grade endometrioid cancer is noted after 9 months of treatment, then we will discuss additional agents or fitness/readiness for surgery. Would also consider radiation treatment at that time.    We also reviewed the importance of weight loss in overall health as well as reduction in cancer risk.  The patient has achieved almost 100 pound weight loss in about a year.  She voices good motivation today to continue weight loss.  Plan to add MMR and p53 to recent biopsy.  We discussed the plan for IUD placement.  This was offered in clinic versus in the OR.  Patient's preference is to proceed to have this done under esthesia.  We will plan for exam under anesthesia, tilt test, hysteroscopy with endometrial sampling, and any other indicated procedures including Mirena IUD placement.  Risks were discussed with the patient and include but are not limited to bleeding, need for blood transfusion, infection, uterine perforation, damage to surrounding structures requiring repair, VTE, stroke, heart attack, and rarely death.  Perioperative instructions were reviewed with her.  She will receive DVT and antibiotic prophylaxis as indicated.  Will discuss possible MRI to assess for myometrial invasion after procedure (weight is below bed limit).  A copy of this note was sent to the patient's referring provider.   75 minutes of total time was spent for this patient encounter, including preparation, face-to-face counseling with the patient and coordination of care, and documentation of the encounter.  Zoe Garnet, MD  Division of Gynecologic Oncology  Department of Obstetrics and Gynecology  [facility]  ___________________________________________  Chief Complaint: Chief Complaint  Patient presents with    Endometrial adenocarcinoma [facility])  History of Present Illness:  Zoe Cobb is a 67 y.o. y.o. female who is seen in consultation at the request of Zoe Cobb for an evaluation of endometrial cancer.  The patient was admitted in 10/2021 for anemia in the setting of vaginal bleeding with plan for outpatient follow-up within a week, which appears did not happen.  Patient was started on 40 mg of Megace twice daily for a total of 7 days.  In January, the patient was seen as a new patient for menopausal bleeding for more than a year.  This was described as intermittent without associated pain or cramping.  Plan at that time was to proceed with EUA and D&C with ultrasound to assess endometrial lining.  Also discussed suppression with progesterone containing IUD.  The patient has had multiple admissions since for vaginal and/or rectal bleeding.  She was admitted in February for bleeding in the setting of an INR elevated at 3.5.  The patient was admitted in April with vaginal and rectal bleeding found to have a supratherapeutic INR in the setting of being on Coumadin for A-fib.  She received vitamin K.  She was treated for diverticulitis at that time.  She was briefly admitted again in July after she presented with recurrent vaginal bleeding with an episode lasting for 3 days.  Her hemoglobin was noted to be 7.7.  This is in the setting of being on Coumadin for A-fib with an INR of 3.6.  Her Coumadin was held while he received vitamin K until INR decreased.  She was also given 2 units of packed red blood cells.  Pelvic ultrasound exam was performed on 03/01/2023 and this revealed a uterus measuring 5.7 x 7 x 13.4 cm with a 4 x 5 x 5.2 lesion measured in the uterus corresponding to a fundal leiomyoma better seen on prior CT scan.  Endometrium with markedly limited evaluation, measures up to 12 mm.  Neither ovary seen.  The patient was taken for hysteroscopy with endometrial sampling on 03/12/2023.  Findings  at surgery included normal-appearing cervix with abundant friable tissue within a dilated cervical os.  Hysteroscopy was abandoned secondary to abundant tissue limiting view.  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  Patient reports overall doing since having her procedure the patient reports doing well today.  Since having her procedure, she reports having more days without bleeding than she did previously.  Some days she still has heavy bleeding similar to a menses or greater.  She was having passage of clots about half the size of tennis balls prior to her surgery.  She has not had any passage of clots since the surgery.  She denies any dizziness.  Endorses decreased appetite for 3-4 months although notes that since her episode of diverticulitis last year she had a change in appetite as well as made changes to her diet.  She weighed 503 at the time of that admission and most recently weighed 416 pounds at the time of her recent surgery.  She feels her clothes are much looser.  She endorses normal bowel and bladder function.  She occasionally has pressure in her pelvis when she is laying in bed.  She denies any pelvic pain or cramping.  The patient lives in an apartment with her spouse.  She has an aide who comes in the morning and another in the evening to help her.  She is not able to stand or walk.  PAST MEDICAL HISTORY:  Past Medical  History:  Diagnosis Date   Arthritis    CHF (congestive heart failure) (HCC)    Diabetes mellitus without complication (HCC)    metformin   Dysrhythmia    A-fib   Hypertension    Morbid obesity (HCC)    Multiple sclerosis (HCC)    asymptomatic   Paroxysmal atrial fibrillation (HCC)      PAST SURGICAL HISTORY:  Past Surgical History:  Procedure Laterality Date   COLONOSCOPY WITH PROPOFOL N/A 12/15/2020   Procedure: COLONOSCOPY WITH PROPOFOL;  Surgeon: Meryl Dare, MD;  Location: [facility];   Service: Endoscopy;  Laterality: N/A;   HYSTEROSCOPY WITH D & C N/A 03/12/2023   Procedure: DIAGNOSTIC HYSTEROSCOPY, ENDOMETRIAL BIOPSY;  Surgeon: Zoe Shire, MD;  Location: [facility] ORS;  Service: Gynecology;  Laterality: N/A;    OB/GYN HISTORY:  OB History  Gravida Para Term Preterm AB Living  2 2          SAB IAB Ectopic Multiple Live Births               # Outcome Date GA Lbr Len/2nd Weight Sex Type Anes PTL Lv  2 Para           1 Para             No LMP recorded. Patient is postmenopausal.  Age at menarche: 5  Age at menopause: 61 Hx of HRT: denies Hx of STDs: denies Last pap: years ago History of abnormal pap smears: denies  SCREENING STUDIES:  Last mammogram: 2019  Last colonoscopy: 2022  MEDICATIONS: Outpatient Encounter Medications as of 03/23/2023  Medication Sig   acetaminophen (TYLENOL) 500 MG tablet Take 500 mg by mouth every 6 (six) hours as needed for moderate pain, mild pain, fever or headache.   cyanocobalamin (VITAMIN B12) 500 MCG tablet Take 1 tablet (500 mcg total) by mouth daily.   ferrous sulfate 325 (65 FE) MG EC tablet Take 1 tablet (325 mg total) by mouth 2 (two) times daily. (Patient taking differently: Take 325 mg by mouth daily with breakfast.)   isosorbide mononitrate (IMDUR) 60 MG 24 hr tablet Take 60 mg by mouth daily.   metformin (FORTAMET) 1000 MG (OSM) 24 hr tablet Take 1,000 mg by mouth 2 (two) times daily with a meal.   metoprolol succinate (TOPROL-XL) 50 MG 24 hr tablet Take 50 mg by mouth daily. Take with or immediately following a meal.   Multiple Vitamins-Minerals (ONE A DAY WOMEN 50 PLUS PO) Take 1 tablet by mouth daily.   torsemide (DEMADEX) 20 MG tablet Take 20 mg by mouth 2 (two) times daily.   warfarin (COUMADIN) 2.5 MG tablet Take 1 tablet (2.5 mg total) by mouth daily. Take with 1 mg tab to equal 3.5 mg daily dose   warfarin (COUMADIN) 5 MG tablet Take 1 tablet (5 mg total) by mouth See admin instructions. On Tues, and Thurs    No facility-administered encounter medications on file as of 03/23/2023.    ALLERGIES:  Allergies  Allergen Reactions   Penicillins Swelling    Has patient had a PCN reaction causing immediate rash, facial/tongue/throat swelling, SOB or lightheadedness with hypotension: Yes Has patient had a PCN reaction causing severe rash involving mucus membranes or skin necrosis: No Has patient had a PCN reaction that required hospitalization No Has patient had a PCN reaction occurring within the last 10 years: No If all of the above answers are "NO", then may proceed with Cephalosporin use.  FAMILY HISTORY:  Family History  Problem Relation Age of Onset   Hypertension Mother    Diabetes Mother    Stomach cancer Father    Kidney disease Sister    Diabetes Sister    Diabetes Sister    Diabetes Sister    Heart attack Brother    HIV/AIDS Brother    Diabetes Brother    Prostate cancer Brother    Colon cancer Neg Hx    Breast cancer Neg Hx    Ovarian cancer Neg Hx    Endometrial cancer Neg Hx    Pancreatic cancer Neg Hx      SOCIAL HISTORY:  Social Connections: Not on file    REVIEW OF SYSTEMS:  Denies appetite changes, fevers, chills, fatigue, unexplained weight changes. Denies hearing loss, neck lumps or masses, mouth sores, ringing in ears or voice changes. Denies cough or wheezing.  Denies shortness of breath. Denies chest pain or palpitations. Denies leg swelling. Denies abdominal distention, pain, blood in stools, constipation, diarrhea, nausea, vomiting, or early satiety. Denies pain with intercourse, dysuria, frequency, hematuria or incontinence. Denies hot flashes, pelvic pain, vaginal bleeding or vaginal discharge.   Denies joint pain, back pain or muscle pain/cramps. Denies itching, rash, or wounds. Denies dizziness, headaches, numbness or seizures. Denies swollen lymph nodes or glands, denies easy bruising or bleeding. Denies anxiety, depression, confusion, or  decreased concentration.  Physical Exam:  Vital Signs for this encounter:  Blood pressure 100/78, pulse (!) 114, temperature 98.5 F (36.9 C), temperature source Oral, resp. rate 19, SpO2 96%. There is no height or weight on file to calculate BMI. General: Alert, oriented, no acute distress.  HEENT: Normocephalic, atraumatic. Sclera anicteric.  Chest: Clear to auscultation bilaterally. No wheezes, rhonchi, or rales. Cardiovascular: Mildly tachycardic in atrial fibrillation, no murmurs, rubs, or gallops.  Abdomen: Obese.  Extremities: Grossly normal range of motion. Warm, well perfused. Trace edema bilaterally.  Skin: No rashes or lesions.  Lymphatics: No cervical, supraclavicular.  GU: Deferred.  LABORATORY AND RADIOLOGIC DATA:  Outside medical records were reviewed to synthesize the above history, along with the history and physical obtained during the visit.   Lab Results  Component Value Date   WBC 8.5 03/09/2023   HGB 9.3 (L) 03/09/2023   HCT 32.6 (L) 03/09/2023   PLT 455 (H) 03/09/2023   GLUCOSE 97 03/09/2023   CHOL 139 08/28/2016   TRIG 53 08/28/2016   HDL 58 08/28/2016   LDLCALC 70 08/28/2016   ALT 12 03/02/2023   AST 17 03/02/2023   NA 134 (L) 03/09/2023   K 3.9 03/09/2023   CL 102 03/09/2023   CREATININE 0.70 03/09/2023   BUN 16 03/09/2023   CO2 22 03/09/2023   TSH 3.223 08/25/2021   INR 2.2 03/19/2023   HGBA1C 6.9 (H) 03/09/2023

## 2023-03-22 NOTE — H&P (View-Only) (Signed)
 GYNECOLOGIC ONCOLOGY NEW PATIENT CONSULTATION   Patient Name: Zoe Cobb  Patient Age: 67 y.o. Date of Service: 03/23/23 Referring Provider: Lorriane Shire, MD  Primary Care Provider: Marletta Lor, NP Consulting Provider: Eugene Garnet, MD   Assessment/Plan:  Postmenopausal patient with significant medical comorbidities and recent diagnosis of low-grade endometrioid endometrial adenocarcinoma.  We reviewed the nature of endometrial cancer and its recommended surgical staging, including total hysterectomy, bilateral salpingo-oophorectomy, and lymph node assessment. Given her multiple medical co-morbidities, she is not a suitable surgical candidate at this time.  We discussed other treatment options for endometrial cancer including medical management with hormonal therapy versus radiation therapy.    We reviewed the role of progesterone therapy and the effect on preneoplastic and neoplastic lesions, believed to include induction of apoptosis in addition to tissue sloughing during withdrawal bleeding.  Activation of the progesterone receptors is believed to lead stromal decidualization and thinning of the lining.  We reviewed the 3 most studied options, to include levonorgesterol IUD (10mcg/d), oral medorxyprogesterone acetate, or oral megesterol acetate.    She understands that all options have few side effects, most common being infrequent edema, GI disturbances, and thromboembolic events), but that local progesterone through IUD may have a stronger effect on the endometrium with less systemic side effects.  Given her weight and comorbidities, avoiding side effects related to high-dose progesterone therapy would be beneficial.  For monitoring, she understands that there is no recommend standard of care.  We will base her surveillance on GOG 224 protocol.  This will include EMB at 3 months following initiation of treatment.  EMBs will be performed at 3 to 6 month intervals, until a  minimum of 3 negative biopsy results are obtained, after which sampling frequeny may then be yearly or until new abnormal uterine bleeding develops.  If persistence of low-grade endometrioid cancer is noted after 9 months of treatment, then we will discuss additional agents or fitness/readiness for surgery. Would also consider radiation treatment at that time.    We also reviewed the importance of weight loss in overall health as well as reduction in cancer risk.  The patient has achieved almost 100 pound weight loss in about a year.  She voices good motivation today to continue weight loss.  Plan to add MMR and p53 to recent biopsy.  We discussed the plan for IUD placement.  This was offered in clinic versus in the OR.  Patient's preference is to proceed to have this done under esthesia.  We will plan for exam under anesthesia, tilt test, hysteroscopy with endometrial sampling, and any other indicated procedures including Mirena IUD placement.  Risks were discussed with the patient and include but are not limited to bleeding, need for blood transfusion, infection, uterine perforation, damage to surrounding structures requiring repair, VTE, stroke, heart attack, and rarely death.  Perioperative instructions were reviewed with her.  She will receive DVT and antibiotic prophylaxis as indicated.  Will discuss possible MRI to assess for myometrial invasion after procedure (weight is below bed limit).  A copy of this note was sent to the patient's referring provider.   75 minutes of total time was spent for this patient encounter, including preparation, face-to-face counseling with the patient and coordination of care, and documentation of the encounter.  Eugene Garnet, MD  Division of Gynecologic Oncology  Department of Obstetrics and Gynecology  [facility]  ___________________________________________  Chief Complaint: Chief Complaint  Patient presents with    Endometrial adenocarcinoma [facility])  History of Present Illness:  Zoe Cobb is a 67 y.o. y.o. female who is seen in consultation at the request of Dr. Briscoe Deutscher for an evaluation of endometrial cancer.  The patient was admitted in 10/2021 for anemia in the setting of vaginal bleeding with plan for outpatient follow-up within a week, which appears did not happen.  Patient was started on 40 mg of Megace twice daily for a total of 7 days.  In January, the patient was seen as a new patient for menopausal bleeding for more than a year.  This was described as intermittent without associated pain or cramping.  Plan at that time was to proceed with EUA and D&C with ultrasound to assess endometrial lining.  Also discussed suppression with progesterone containing IUD.  The patient has had multiple admissions since for vaginal and/or rectal bleeding.  She was admitted in February for bleeding in the setting of an INR elevated at 3.5.  The patient was admitted in April with vaginal and rectal bleeding found to have a supratherapeutic INR in the setting of being on Coumadin for A-fib.  She received vitamin K.  She was treated for diverticulitis at that time.  She was briefly admitted again in July after she presented with recurrent vaginal bleeding with an episode lasting for 3 days.  Her hemoglobin was noted to be 7.7.  This is in the setting of being on Coumadin for A-fib with an INR of 3.6.  Her Coumadin was held while he received vitamin K until INR decreased.  She was also given 2 units of packed red blood cells.  Pelvic ultrasound exam was performed on 03/01/2023 and this revealed a uterus measuring 5.7 x 7 x 13.4 cm with a 4 x 5 x 5.2 lesion measured in the uterus corresponding to a fundal leiomyoma better seen on prior CT scan.  Endometrium with markedly limited evaluation, measures up to 12 mm.  Neither ovary seen.  The patient was taken for hysteroscopy with endometrial sampling on 03/12/2023.  Findings  at surgery included normal-appearing cervix with abundant friable tissue within a dilated cervical os.  Hysteroscopy was abandoned secondary to abundant tissue limiting view.  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  Patient reports overall doing since having her procedure the patient reports doing well today.  Since having her procedure, she reports having more days without bleeding than she did previously.  Some days she still has heavy bleeding similar to a menses or greater.  She was having passage of clots about half the size of tennis balls prior to her surgery.  She has not had any passage of clots since the surgery.  She denies any dizziness.  Endorses decreased appetite for 3-4 months although notes that since her episode of diverticulitis last year she had a change in appetite as well as made changes to her diet.  She weighed 503 at the time of that admission and most recently weighed 416 pounds at the time of her recent surgery.  She feels her clothes are much looser.  She endorses normal bowel and bladder function.  She occasionally has pressure in her pelvis when she is laying in bed.  She denies any pelvic pain or cramping.  The patient lives in an apartment with her spouse.  She has an aide who comes in the morning and another in the evening to help her.  She is not able to stand or walk.  PAST MEDICAL HISTORY:  Past Medical  History:  Diagnosis Date   Arthritis    CHF (congestive heart failure) (HCC)    Diabetes mellitus without complication (HCC)    metformin   Dysrhythmia    A-fib   Hypertension    Morbid obesity (HCC)    Multiple sclerosis (HCC)    asymptomatic   Paroxysmal atrial fibrillation (HCC)      PAST SURGICAL HISTORY:  Past Surgical History:  Procedure Laterality Date   COLONOSCOPY WITH PROPOFOL N/A 12/15/2020   Procedure: COLONOSCOPY WITH PROPOFOL;  Surgeon: Meryl Dare, MD;  Location: [facility];   Service: Endoscopy;  Laterality: N/A;   HYSTEROSCOPY WITH D & C N/A 03/12/2023   Procedure: DIAGNOSTIC HYSTEROSCOPY, ENDOMETRIAL BIOPSY;  Surgeon: Lorriane Shire, MD;  Location: [facility] ORS;  Service: Gynecology;  Laterality: N/A;    OB/GYN HISTORY:  OB History  Gravida Para Term Preterm AB Living  2 2          SAB IAB Ectopic Multiple Live Births               # Outcome Date GA Lbr Len/2nd Weight Sex Type Anes PTL Lv  2 Para           1 Para             No LMP recorded. Patient is postmenopausal.  Age at menarche: 5  Age at menopause: 61 Hx of HRT: denies Hx of STDs: denies Last pap: years ago History of abnormal pap smears: denies  SCREENING STUDIES:  Last mammogram: 2019  Last colonoscopy: 2022  MEDICATIONS: Outpatient Encounter Medications as of 03/23/2023  Medication Sig   acetaminophen (TYLENOL) 500 MG tablet Take 500 mg by mouth every 6 (six) hours as needed for moderate pain, mild pain, fever or headache.   cyanocobalamin (VITAMIN B12) 500 MCG tablet Take 1 tablet (500 mcg total) by mouth daily.   ferrous sulfate 325 (65 FE) MG EC tablet Take 1 tablet (325 mg total) by mouth 2 (two) times daily. (Patient taking differently: Take 325 mg by mouth daily with breakfast.)   isosorbide mononitrate (IMDUR) 60 MG 24 hr tablet Take 60 mg by mouth daily.   metformin (FORTAMET) 1000 MG (OSM) 24 hr tablet Take 1,000 mg by mouth 2 (two) times daily with a meal.   metoprolol succinate (TOPROL-XL) 50 MG 24 hr tablet Take 50 mg by mouth daily. Take with or immediately following a meal.   Multiple Vitamins-Minerals (ONE A DAY WOMEN 50 PLUS PO) Take 1 tablet by mouth daily.   torsemide (DEMADEX) 20 MG tablet Take 20 mg by mouth 2 (two) times daily.   warfarin (COUMADIN) 2.5 MG tablet Take 1 tablet (2.5 mg total) by mouth daily. Take with 1 mg tab to equal 3.5 mg daily dose   warfarin (COUMADIN) 5 MG tablet Take 1 tablet (5 mg total) by mouth See admin instructions. On Tues, and Thurs    No facility-administered encounter medications on file as of 03/23/2023.    ALLERGIES:  Allergies  Allergen Reactions   Penicillins Swelling    Has patient had a PCN reaction causing immediate rash, facial/tongue/throat swelling, SOB or lightheadedness with hypotension: Yes Has patient had a PCN reaction causing severe rash involving mucus membranes or skin necrosis: No Has patient had a PCN reaction that required hospitalization No Has patient had a PCN reaction occurring within the last 10 years: No If all of the above answers are "NO", then may proceed with Cephalosporin use.  FAMILY HISTORY:  Family History  Problem Relation Age of Onset   Hypertension Mother    Diabetes Mother    Stomach cancer Father    Kidney disease Sister    Diabetes Sister    Diabetes Sister    Diabetes Sister    Heart attack Brother    HIV/AIDS Brother    Diabetes Brother    Prostate cancer Brother    Colon cancer Neg Hx    Breast cancer Neg Hx    Ovarian cancer Neg Hx    Endometrial cancer Neg Hx    Pancreatic cancer Neg Hx      SOCIAL HISTORY:  Social Connections: Not on file    REVIEW OF SYSTEMS:  Denies appetite changes, fevers, chills, fatigue, unexplained weight changes. Denies hearing loss, neck lumps or masses, mouth sores, ringing in ears or voice changes. Denies cough or wheezing.  Denies shortness of breath. Denies chest pain or palpitations. Denies leg swelling. Denies abdominal distention, pain, blood in stools, constipation, diarrhea, nausea, vomiting, or early satiety. Denies pain with intercourse, dysuria, frequency, hematuria or incontinence. Denies hot flashes, pelvic pain, vaginal bleeding or vaginal discharge.   Denies joint pain, back pain or muscle pain/cramps. Denies itching, rash, or wounds. Denies dizziness, headaches, numbness or seizures. Denies swollen lymph nodes or glands, denies easy bruising or bleeding. Denies anxiety, depression, confusion, or  decreased concentration.  Physical Exam:  Vital Signs for this encounter:  Blood pressure 100/78, pulse (!) 114, temperature 98.5 F (36.9 C), temperature source Oral, resp. rate 19, SpO2 96%. There is no height or weight on file to calculate BMI. General: Alert, oriented, no acute distress.  HEENT: Normocephalic, atraumatic. Sclera anicteric.  Chest: Clear to auscultation bilaterally. No wheezes, rhonchi, or rales. Cardiovascular: Mildly tachycardic in atrial fibrillation, no murmurs, rubs, or gallops.  Abdomen: Obese.  Extremities: Grossly normal range of motion. Warm, well perfused. Trace edema bilaterally.  Skin: No rashes or lesions.  Lymphatics: No cervical, supraclavicular.  GU: Deferred.  LABORATORY AND RADIOLOGIC DATA:  Outside medical records were reviewed to synthesize the above history, along with the history and physical obtained during the visit.   Lab Results  Component Value Date   WBC 8.5 03/09/2023   HGB 9.3 (L) 03/09/2023   HCT 32.6 (L) 03/09/2023   PLT 455 (H) 03/09/2023   GLUCOSE 97 03/09/2023   CHOL 139 08/28/2016   TRIG 53 08/28/2016   HDL 58 08/28/2016   LDLCALC 70 08/28/2016   ALT 12 03/02/2023   AST 17 03/02/2023   NA 134 (L) 03/09/2023   K 3.9 03/09/2023   CL 102 03/09/2023   CREATININE 0.70 03/09/2023   BUN 16 03/09/2023   CO2 22 03/09/2023   TSH 3.223 08/25/2021   INR 2.2 03/19/2023   HGBA1C 6.9 (H) 03/09/2023

## 2023-03-23 ENCOUNTER — Inpatient Hospital Stay: Payer: 59 | Attending: Gynecologic Oncology | Admitting: Gynecologic Oncology

## 2023-03-23 ENCOUNTER — Encounter: Payer: Self-pay | Admitting: Oncology

## 2023-03-23 ENCOUNTER — Encounter: Payer: Self-pay | Admitting: Gynecologic Oncology

## 2023-03-23 ENCOUNTER — Telehealth: Payer: Self-pay

## 2023-03-23 ENCOUNTER — Inpatient Hospital Stay (HOSPITAL_BASED_OUTPATIENT_CLINIC_OR_DEPARTMENT_OTHER): Payer: 59 | Admitting: Gynecologic Oncology

## 2023-03-23 ENCOUNTER — Other Ambulatory Visit: Payer: Self-pay

## 2023-03-23 VITALS — BP 100/78 | HR 114 | Temp 98.5°F | Resp 19

## 2023-03-23 DIAGNOSIS — N950 Postmenopausal bleeding: Secondary | ICD-10-CM | POA: Insufficient documentation

## 2023-03-23 DIAGNOSIS — I5032 Chronic diastolic (congestive) heart failure: Secondary | ICD-10-CM

## 2023-03-23 DIAGNOSIS — Z7901 Long term (current) use of anticoagulants: Secondary | ICD-10-CM | POA: Diagnosis not present

## 2023-03-23 DIAGNOSIS — Z6841 Body Mass Index (BMI) 40.0 and over, adult: Secondary | ICD-10-CM | POA: Diagnosis not present

## 2023-03-23 DIAGNOSIS — C541 Malignant neoplasm of endometrium: Secondary | ICD-10-CM

## 2023-03-23 DIAGNOSIS — Z79899 Other long term (current) drug therapy: Secondary | ICD-10-CM | POA: Diagnosis not present

## 2023-03-23 DIAGNOSIS — I480 Paroxysmal atrial fibrillation: Secondary | ICD-10-CM | POA: Insufficient documentation

## 2023-03-23 DIAGNOSIS — Z7984 Long term (current) use of oral hypoglycemic drugs: Secondary | ICD-10-CM | POA: Diagnosis not present

## 2023-03-23 DIAGNOSIS — G35 Multiple sclerosis: Secondary | ICD-10-CM | POA: Insufficient documentation

## 2023-03-23 DIAGNOSIS — I110 Hypertensive heart disease with heart failure: Secondary | ICD-10-CM | POA: Insufficient documentation

## 2023-03-23 DIAGNOSIS — I509 Heart failure, unspecified: Secondary | ICD-10-CM | POA: Diagnosis not present

## 2023-03-23 DIAGNOSIS — D500 Iron deficiency anemia secondary to blood loss (chronic): Secondary | ICD-10-CM | POA: Diagnosis not present

## 2023-03-23 DIAGNOSIS — R102 Pelvic and perineal pain: Secondary | ICD-10-CM | POA: Insufficient documentation

## 2023-03-23 DIAGNOSIS — E119 Type 2 diabetes mellitus without complications: Secondary | ICD-10-CM | POA: Insufficient documentation

## 2023-03-23 NOTE — Patient Instructions (Addendum)
We will need to reach out to your cardiologist to obtain clearance to proceed with the below procedure along with your Coumadin recommendations.   Preparing for your Surgery  Plan for surgery on April 04, 2023 with Dr. Eugene Garnet at [facility]. You will be scheduled for examination under anesthesia, dilation and curettage of the uterus with hysteroscopy, possible myosure, mirena intrauterine device placement, tilt test.   Pre-operative Testing -You will receive a phone call from presurgical testing at [facility] to discuss surgery instructions and arrange for lab work if needed.  -Bring your insurance card, copy of an advanced directive if applicable, medication list.  -You should not be taking blood thinners or aspirin at least ten days prior to surgery unless instructed by your surgeon.  -Do not take supplements such as fish oil (omega 3), red yeast rice, turmeric before your surgery. You want to avoid medications with aspirin in them including headache powders such as BC or Goody's), Excedrin migraine.  Day Before Surgery at Home -You will be advised you can have clear liquids up until 3 hours before your surgery.    Your role in recovery Your role is to become active as soon as directed by your doctor, while still giving yourself time to heal.  Rest when you feel tired. You will be asked to do the following in order to speed your recovery:  - Cough and breathe deeply. This helps to clear and expand your lungs and can prevent pneumonia after surgery.  - STAY ACTIVE WHEN YOU GET HOME. Do mild physical activity. Walking or moving your legs help your circulation and body functions return to normal. Do not try to get up or walk alone the first time after surgery.   -If you develop swelling on one leg or the other, pain in the back of your leg, redness/warmth in one of your legs, please call the office or go to the Emergency Room to have a doppler to rule out a blood  clot. For shortness of breath, chest pain-seek care in the Emergency Room as soon as possible. - Actively manage your pain. Managing your pain lets you move in comfort. We will ask you to rate your pain on a scale of zero to 10. It is your responsibility to tell your doctor or nurse where and how much you hurt so your pain can be treated.  Special Considerations -Your final pathology results from surgery should be available around one week after surgery and the results will be relayed to you when available.  -FMLA forms can be faxed to [phone] and please allow 5-7 business days for completion.  Pain Management After Surgery -Make sure that you have Tylenol at home IF YOU ARE ABLE TO TAKE THESE MEDICATION to use on a regular basis after surgery for pain control.   -Review the attached handout on narcotic use and their risks and side effects.   Bowel Regimen -It is important to prevent constipation and drink adequate amounts of liquids.   Risks of Surgery Risks of surgery are low but include bleeding, infection, damage to surrounding structures, re-operation, blood clots, and very rarely death.  AFTER SURGERY INSTRUCTIONS  Return to work: 1-2 days if applicable  Activity: 1. Be up and out of the bed during the day.  Take a nap if needed.  You may walk up steps but be careful and use the hand rail.  Stair climbing will tire you more than you think, you may need to  stop part way and rest.   2. No lifting or straining for 1 week over 10 pounds. No pushing, pulling, straining for 1 week.  3. No driving for minimum 24 hours after surgery if you were cleared to drive before surgery.  Do not drive if you are taking narcotic pain medicine and make sure that your reaction time has returned.   4. You can shower as soon as the next day after surgery. Shower daily. No tub baths or submerging your body in water for 2 weeks after surgery.   5. No sexual activity and nothing in the vagina for 2  weeks.  6. You may experience vaginal spotting and discharge after surgery.  The spotting is normal but if you experience heavy bleeding, call our office.  7. Take Tylenol for pain if you are able to take these medication and only use narcotic pain medication for severe pain not relieved by the Tylenol.  Monitor your Tylenol intake to a max of 4,000 mg in a 24 hour period.   Diet: 1. Low sodium Heart Healthy Diet is recommended but you are cleared to resume your normal (before surgery) diet after your procedure.  2. It is safe to use a laxative, such as Miralax or Colace, if you have difficulty moving your bowels.   Wound Care: 1. Keep clean and dry.  Shower daily.  Reasons to call the Doctor: Fever - Oral temperature greater than 100.4 degrees Fahrenheit Foul-smelling vaginal discharge Difficulty urinating Nausea and vomiting Increased pain at the site of the incision that is unrelieved with pain medicine. Difficulty breathing with or without chest pain New calf pain especially if only on one side Sudden, continuing increased vaginal bleeding with or without clots.   Contacts: For questions or concerns you should contact:  Dr. Eugene Garnet at [phone]  Warner Mccreedy, NP at [phone]  After Hours: call [phone] and have the GYN Oncologist paged/contacted (after 5 pm or on the weekends).  Messages sent via mychart are for non-urgent matters and are not responded to after hours so for urgent needs, please call the after hours number.

## 2023-03-23 NOTE — Telephone Encounter (Signed)
Faxed over surgical optimization form to cardiologist Dr Yates Decamp fax #[phone].Marland Kitchen

## 2023-03-23 NOTE — Progress Notes (Signed)
Requested MMR and P53 on accession [id number] with [facility] via email.

## 2023-03-27 ENCOUNTER — Encounter: Payer: Self-pay | Admitting: Cardiology

## 2023-03-27 NOTE — Telephone Encounter (Signed)
Surgical clearance received from [facility] cardiovascular regarding clearance for upcoming surgery.   It is ok to precede w/o further cardiac testing. Can withhold Warfarin for 5 days prior to procedure and re-start same day post procedure.   Melissa Cross APP notified.

## 2023-03-27 NOTE — Progress Notes (Signed)
Patient here for new patient consultation with Dr. Pricilla Holm and for a pre-operative appointment prior to her scheduled surgery on April 04, 2023. She is scheduled for a examination under anesthesia, dilation and curettage of the uterus with hysteroscopy, possible myosure, mirena intrauterine device placement, tilt test. The surgery was discussed in detail.  See after visit summary for additional details. Visual aids used to discuss items related to surgery.   Discussed post-op pain management in detail including the aspects of the enhanced recovery pathway.  We discussed the use of tylenol post-op. Discussed bowel regimen in detail.     Discussed measures to take at home to prevent DVT including frequent mobility.  Reportable signs and symptoms of DVT discussed. Post-operative instructions discussed and expectations for after surgery.     10 minutes spent with the patient/preparing information.  Verbalizing understanding of material discussed. No needs or concerns voiced at the end of the visit.   Advised patient to call for any needs.   This appointment is included in the global surgical bundle as pre-operative teaching and has no charge.

## 2023-03-27 NOTE — Patient Instructions (Signed)
We will need to reach out to your cardiologist to obtain clearance to proceed with the below procedure along with your Coumadin recommendations.    Preparing for your Surgery   Plan for surgery on April 04, 2023 with Dr. Eugene Garnet at [facility]. You will be scheduled for examination under anesthesia, dilation and curettage of the uterus with hysteroscopy, possible myosure, mirena intrauterine device placement, tilt test.    Pre-operative Testing -You will receive a phone call from presurgical testing at [facility] to discuss surgery instructions and arrange for lab work if needed.   -Bring your insurance card, copy of an advanced directive if applicable, medication list.   -You should not be taking blood thinners or aspirin at least ten days prior to surgery unless instructed by your surgeon.   -Do not take supplements such as fish oil (omega 3), red yeast rice, turmeric before your surgery. You want to avoid medications with aspirin in them including headache powders such as BC or Goody's), Excedrin migraine.   Day Before Surgery at Home -You will be advised you can have clear liquids up until 3 hours before your surgery.     Your role in recovery Your role is to become active as soon as directed by your doctor, while still giving yourself time to heal.  Rest when you feel tired. You will be asked to do the following in order to speed your recovery:   - Cough and breathe deeply. This helps to clear and expand your lungs and can prevent pneumonia after surgery.  - STAY ACTIVE WHEN YOU GET HOME. Do mild physical activity. Walking or moving your legs help your circulation and body functions return to normal. Do not try to get up or walk alone the first time after surgery.   -If you develop swelling on one leg or the other, pain in the back of your leg, redness/warmth in one of your legs, please call the office or go to the Emergency Room to have a doppler to rule out  a blood clot. For shortness of breath, chest pain-seek care in the Emergency Room as soon as possible. - Actively manage your pain. Managing your pain lets you move in comfort. We will ask you to rate your pain on a scale of zero to 10. It is your responsibility to tell your doctor or nurse where and how much you hurt so your pain can be treated.   Special Considerations -Your final pathology results from surgery should be available around one week after surgery and the results will be relayed to you when available.   -FMLA forms can be faxed to [phone] and please allow 5-7 business days for completion.   Pain Management After Surgery -Make sure that you have Tylenol at home IF YOU ARE ABLE TO TAKE THESE MEDICATION to use on a regular basis after surgery for pain control.    -Review the attached handout on narcotic use and their risks and side effects.    Bowel Regimen -It is important to prevent constipation and drink adequate amounts of liquids.    Risks of Surgery Risks of surgery are low but include bleeding, infection, damage to surrounding structures, re-operation, blood clots, and very rarely death.   AFTER SURGERY INSTRUCTIONS   Return to work: 1-2 days if applicable   Activity: 1. Be up and out of the bed during the day.  Take a nap if needed.  You may walk up steps but be careful and  use the hand rail.  Stair climbing will tire you more than you think, you may need to stop part way and rest.    2. No lifting or straining for 1 week over 10 pounds. No pushing, pulling, straining for 1 week.   3. No driving for minimum 24 hours after surgery if you were cleared to drive before surgery.  Do not drive if you are taking narcotic pain medicine and make sure that your reaction time has returned.    4. You can shower as soon as the next day after surgery. Shower daily. No tub baths or submerging your body in water for 2 weeks after surgery.    5. No sexual activity and nothing  in the vagina for 2 weeks.   6. You may experience vaginal spotting and discharge after surgery.  The spotting is normal but if you experience heavy bleeding, call our office.   7. Take Tylenol for pain if you are able to take these medication and only use narcotic pain medication for severe pain not relieved by the Tylenol.  Monitor your Tylenol intake to a max of 4,000 mg in a 24 hour period.    Diet: 1. Low sodium Heart Healthy Diet is recommended but you are cleared to resume your normal (before surgery) diet after your procedure.   2. It is safe to use a laxative, such as Miralax or Colace, if you have difficulty moving your bowels.    Wound Care: 1. Keep clean and dry.  Shower daily.   Reasons to call the Doctor: Fever - Oral temperature greater than 100.4 degrees Fahrenheit Foul-smelling vaginal discharge Difficulty urinating Nausea and vomiting Increased pain at the site of the incision that is unrelieved with pain medicine. Difficulty breathing with or without chest pain New calf pain especially if only on one side Sudden, continuing increased vaginal bleeding with or without clots.   Contacts: For questions or concerns you should contact:   Dr. Eugene Garnet at [phone]   Warner Mccreedy, NP at [phone]   After Hours: call [phone] and have the GYN Oncologist paged/contacted (after 5 pm or on the weekends).   Messages sent via mychart are for non-urgent matters and are not responded to after hours so for urgent needs, please call the after hours number.

## 2023-03-28 ENCOUNTER — Telehealth: Payer: Self-pay | Admitting: *Deleted

## 2023-03-28 ENCOUNTER — Encounter (HOSPITAL_COMMUNITY): Payer: Self-pay | Admitting: Gynecologic Oncology

## 2023-03-28 NOTE — Patient Instructions (Addendum)
SURGICAL WAITING ROOM VISITATION  Patients having surgery or a procedure may have no more than 2 support people in the waiting area - these visitors may rotate.    Children under the age of 18 must have an adult with them who is not the patient.  Due to an increase in RSV and influenza rates and associated hospitalizations, children ages 32 and under may not visit patients in [facility] hospitals.  If the patient needs to stay at the hospital during part of their recovery, the visitor guidelines for inpatient rooms apply. Pre-op nurse will coordinate an appropriate time for 1 support person to accompany patient in pre-op.  This support person may not rotate.    Please refer to the [facility] website for the visitor guidelines for Inpatients (after your surgery is over and you are in a regular room).    Your procedure is scheduled on: 04/04/23   Report to [facility] Main Entrance    Report to admitting at 5:15 AM   Call this number if you have problems the morning of surgery [phone]   Do not eat food :After Midnight.   After Midnight you may have the following liquids until 4:30 AM DAY OF SURGERY  Water Non-Citrus Juices (without pulp, NO RED-Apple, White grape, White cranberry) Black Coffee (NO MILK/CREAM OR CREAMERS, sugar ok)  Clear Tea (NO MILK/CREAM OR CREAMERS, sugar ok) regular and decaf                             Plain Jell-O (NO RED)                                           Fruit ices (not with fruit pulp, NO RED)                                     Popsicles (NO RED)                                                               Sports drinks like Gatorade (NO RED)                      If you have questions, please contact your surgeon's office.   FOLLOW BOWEL PREP AND ANY ADDITIONAL PRE OP INSTRUCTIONS YOU RECEIVED FROM YOUR SURGEON'S OFFICE!!!     Oral Hygiene is also important to reduce your risk of infection.                                     Remember - BRUSH YOUR TEETH THE MORNING OF SURGERY WITH YOUR REGULAR TOOTHPASTE  DENTURES WILL BE REMOVED PRIOR TO SURGERY PLEASE DO NOT APPLY "Poly grip" OR ADHESIVES!!!   Stop all vitamins and herbal supplements 7 days before surgery.   Take these medicines the morning of surgery with A SIP OF WATER: Isosorbide, Metoprolol  DO NOT TAKE ANY ORAL DIABETIC MEDICATIONS DAY OF YOUR SURGERY  How to Manage Your Diabetes Before and After Surgery  Why is it important to control my blood sugar before and after surgery? Improving blood sugar levels before and after surgery helps healing and can limit problems. A way of improving blood sugar control is eating a healthy diet by:  Eating less sugar and carbohydrates  Increasing activity/exercise  Talking with your doctor about reaching your blood sugar goals High blood sugars (greater than 180 mg/dL) can raise your risk of infections and slow your recovery, so you will need to focus on controlling your diabetes during the weeks before surgery. Make sure that the doctor who takes care of your diabetes knows about your planned surgery including the date and location.  How do I manage my blood sugar before surgery? Check your blood sugar at least 4 times a day, starting 2 days before surgery, to make sure that the level is not too high or low. Check your blood sugar the morning of your surgery when you wake up and every 2 hours until you get to the Short Stay unit. If your blood sugar is less than 70 mg/dL, you will need to treat for low blood sugar: Do not take insulin. Treat a low blood sugar (less than 70 mg/dL) with  cup of clear juice (cranberry or apple), 4 glucose tablets, OR glucose gel. Recheck blood sugar in 15 minutes after treatment (to make sure it is greater than 70 mg/dL). If your blood sugar is not greater than 70 mg/dL on recheck, call [phone] for further instructions. Report your blood sugar to the short stay nurse when you  get to Short Stay.  If you are admitted to the hospital after surgery: Your blood sugar will be checked by the staff and you will probably be given insulin after surgery (instead of oral diabetes medicines) to make sure you have good blood sugar levels. The goal for blood sugar control after surgery is 80-180 mg/dL.   WHAT DO I DO ABOUT MY DIABETES MEDICATION?  Do not take oral diabetes medicines (pills) the morning of surgery.  THE DAY BEFORE SURGERY, take Metformin.      THE MORNING OF SURGERY, take do not take Metformin   Reviewed and Endorsed by [facility] Patient Education Committee, August 2015                              You may not have any metal on your body including hair pins, jewelry, and body piercing             Do not wear make-up, lotions, powders, perfumes, or deodorant  Do not wear nail polish including gel and S&S, artificial/acrylic nails, or any other type of covering on natural nails including finger and toenails. If you have artificial nails, gel coating, etc. that needs to be removed by a nail salon please have this removed prior to surgery or surgery may need to be canceled/ delayed if the surgeon/ anesthesia feels like they are unable to be safely monitored.   Do not shave  48 hours prior to surgery.    Do not bring valuables to the hospital. [facility] IS NOT             RESPONSIBLE   FOR VALUABLES.   Contacts, glasses, dentures or bridgework may not be worn into surgery  DO NOT BRING YOUR HOME MEDICATIONS TO THE HOSPITAL. PHARMACY WILL DISPENSE MEDICATIONS LISTED ON YOUR MEDICATION LIST  TO YOU DURING YOUR ADMISSION IN THE HOSPITAL!    Patients discharged on the day of surgery will not be allowed to drive home.  Someone NEEDS to stay with you for the first 24 hours after anesthesia.              Please read over the following fact sheets you were given: IF YOU HAVE QUESTIONS ABOUT YOUR PRE-OP INSTRUCTIONS PLEASE CALL [phone] Contras   If you  received a COVID test during your pre-op visit  it is requested that you wear a mask when out in public, stay away from anyone that may not be feeling well and notify your surgeon if you develop symptoms. If you test positive for Covid or have been in contact with anyone that has tested positive in the last 10 days please notify you surgeon.     - Preparing for Surgery Before surgery, you can play an important role.  Because skin is not sterile, your skin needs to be as free of germs as possible.  You can reduce the number of germs on your skin by washing with CHG (chlorahexidine gluconate) soap before surgery.  CHG is an antiseptic cleaner which kills germs and bonds with the skin to continue killing germs even after washing. Please DO NOT use if you have an allergy to CHG or antibacterial soaps.  If your skin becomes reddened/irritated stop using the CHG and inform your nurse when you arrive at Short Stay. Do not shave (including legs and underarms) for at least 48 hours prior to the first CHG shower.  You may shave your face/neck.  Please follow these instructions carefully:  1.  Shower with CHG Soap the night before surgery and the  morning of surgery.  2.  If you choose to wash your hair, wash your hair first as usual with your normal  shampoo.  3.  After you shampoo, rinse your hair and body thoroughly to remove the shampoo.                             4.  Use CHG as you would any other liquid soap.  You can apply chg directly to the skin and wash.  Gently with a scrungie or clean washcloth.  5.  Apply the CHG Soap to your body ONLY FROM THE NECK DOWN.   Do   not use on face/ open                           Wound or open sores. Avoid contact with eyes, ears mouth and   genitals (private parts).                       Wash face,  Genitals (private parts) with your normal soap.             6.  Wash thoroughly, paying special attention to the area where your    surgery  will be  performed.  7.  Thoroughly rinse your body with warm water from the neck down.  8.  DO NOT shower/wash with your normal soap after using and rinsing off the CHG Soap.                9.  Pat yourself dry with a clean towel.            10.  Wear clean pajamas.  11.  Place clean sheets on your bed the night of your first shower and do not  sleep with pets. Day of Surgery : Do not apply any lotions/deodorants the morning of surgery.  Please wear clean clothes to the hospital/surgery center.  FAILURE TO FOLLOW THESE INSTRUCTIONS MAY RESULT IN THE CANCELLATION OF YOUR SURGERY  PATIENT SIGNATURE_________________________________  NURSE SIGNATURE__________________________________  ________________________________________________________________________

## 2023-03-28 NOTE — Progress Notes (Signed)
Bari bed requested

## 2023-03-28 NOTE — Progress Notes (Signed)
COVID Vaccine Completed: yes  Date of COVID positive in last 90 days: no  PCP - Marletta Lor, NP Cardiologist - Yates Decamp, MD LOV 06/21/22  Chest x-ray - n/a EKG - 06/15/22 Epic Stress Test - n/a ECHO - 08/29/16 Epic Cardiac Cath - n/a Pacemaker/ICD device last checked: n/a Spinal Cord Stimulator:n/a  Bowel Prep - no  Sleep Study - n/a CPAP -   Fasting Blood Sugar -  Checks Blood Sugar 1 times a day  Last dose of GLP1 agonist-  N/A GLP1 instructions:  N/A   Last dose of SGLT-2 inhibitors-  N/A SGLT-2 instructions: N/A   Blood Thinner Instructions:  Warfarin , hold 5 days Aspirin Instructions: Last Dose:03/29/23 1000  Activity level: Can perform activities of daily living without stopping and without symptoms of chest pain or shortness of breath. Patient is immobile   Anesthesia review: CHF, A fib, HTN, SOB, thrombocytopenia, DM2, BMI 78  Patient denies shortness of breath, fever, cough and chest pain at PAT appointment  Patient verbalized understanding of instructions that were given to them at the PAT appointment. Patient was also instructed that they will need to review over the PAT instructions again at home before surgery.

## 2023-03-28 NOTE — Telephone Encounter (Signed)
Spoke with Zoe Cobb and reiterated recommendations from Dr. Jacinto Halim that patient is to hold warfarin for 5 days prior to procedure. Pt is to take her last dose on Thursday, August 22 nd. The day of surgery does not count for the 5 days prior. Pt is to re-start same day post procedure.  Pt verbalized understanding and had no further questions at this time. Pt reminded that the office would call her the day before her scheduled surgery for pre-op call.

## 2023-04-03 ENCOUNTER — Telehealth: Payer: Self-pay | Admitting: *Deleted

## 2023-04-03 NOTE — Telephone Encounter (Signed)
Telephone call to check on pre-operative status.  Patient compliant with pre-operative instructions.  Reinforced nothing to eat after midnight. Clear liquids until 0530. Patient to arrive at 0630.  No questions or concerns voiced.  Instructed to call for any needs. 

## 2023-04-03 NOTE — Pre-Procedure Instructions (Signed)
Called patient regarding surgery for tomorrow.  Informed patient that we now need her to arrive at [facility] tomorrow @ 5:45AM.  She stated she would do the best she could, since this is 2nd time the time has been changed.

## 2023-04-04 ENCOUNTER — Encounter (HOSPITAL_COMMUNITY)
Admission: RE | Disposition: A | Discharge status: Home / Self Care | Payer: Self-pay | Source: Ambulatory Visit | Attending: Gynecologic Oncology

## 2023-04-04 ENCOUNTER — Other Ambulatory Visit: Payer: Self-pay

## 2023-04-04 ENCOUNTER — Ambulatory Visit (HOSPITAL_BASED_OUTPATIENT_CLINIC_OR_DEPARTMENT_OTHER)
Admission: RE | Admit: 2023-04-04 | Discharge: 2023-04-04 | Disposition: A | Discharge status: Home / Self Care | Payer: 59 | Source: Ambulatory Visit | Attending: Gynecologic Oncology | Admitting: Gynecologic Oncology

## 2023-04-04 ENCOUNTER — Encounter (HOSPITAL_COMMUNITY): Payer: Self-pay | Admitting: Gynecologic Oncology

## 2023-04-04 ENCOUNTER — Ambulatory Visit (HOSPITAL_COMMUNITY): Payer: 59 | Admitting: Physician Assistant

## 2023-04-04 ENCOUNTER — Ambulatory Visit (HOSPITAL_BASED_OUTPATIENT_CLINIC_OR_DEPARTMENT_OTHER): Payer: 59 | Admitting: Physician Assistant

## 2023-04-04 DIAGNOSIS — Z7901 Long term (current) use of anticoagulants: Secondary | ICD-10-CM | POA: Insufficient documentation

## 2023-04-04 DIAGNOSIS — I110 Hypertensive heart disease with heart failure: Secondary | ICD-10-CM | POA: Insufficient documentation

## 2023-04-04 DIAGNOSIS — D62 Acute posthemorrhagic anemia: Secondary | ICD-10-CM | POA: Diagnosis not present

## 2023-04-04 DIAGNOSIS — D259 Leiomyoma of uterus, unspecified: Secondary | ICD-10-CM

## 2023-04-04 DIAGNOSIS — I509 Heart failure, unspecified: Secondary | ICD-10-CM | POA: Insufficient documentation

## 2023-04-04 DIAGNOSIS — Z6841 Body Mass Index (BMI) 40.0 and over, adult: Secondary | ICD-10-CM | POA: Insufficient documentation

## 2023-04-04 DIAGNOSIS — E119 Type 2 diabetes mellitus without complications: Secondary | ICD-10-CM | POA: Insufficient documentation

## 2023-04-04 DIAGNOSIS — C541 Malignant neoplasm of endometrium: Secondary | ICD-10-CM | POA: Insufficient documentation

## 2023-04-04 DIAGNOSIS — I5032 Chronic diastolic (congestive) heart failure: Secondary | ICD-10-CM

## 2023-04-04 DIAGNOSIS — Z87891 Personal history of nicotine dependence: Secondary | ICD-10-CM | POA: Insufficient documentation

## 2023-04-04 DIAGNOSIS — I480 Paroxysmal atrial fibrillation: Secondary | ICD-10-CM | POA: Insufficient documentation

## 2023-04-04 HISTORY — PX: INTRAUTERINE DEVICE (IUD) INSERTION: SHX5877

## 2023-04-04 HISTORY — PX: DILATATION & CURETTAGE/HYSTEROSCOPY WITH MYOSURE: SHX6511

## 2023-04-04 LAB — GLUCOSE, CAPILLARY
Glucose-Capillary: 66 mg/dL — ABNORMAL LOW (ref 70–99)
Glucose-Capillary: 155 mg/dL — ABNORMAL HIGH (ref 70–99)
Glucose-Capillary: 98 mg/dL (ref 70–99)

## 2023-04-04 SURGERY — DILATATION & CURETTAGE/HYSTEROSCOPY WITH MYOSURE
Anesthesia: General | Site: Vagina

## 2023-04-04 MED ORDER — 0.9 % SODIUM CHLORIDE (POUR BTL) OPTIME
TOPICAL | Status: DC | PRN
  Administered 2023-04-04: 1000 mL

## 2023-04-04 MED ORDER — PROPOFOL 10 MG/ML IV BOLUS
INTRAVENOUS | Status: AC
  Filled 2023-04-04: qty 20

## 2023-04-04 MED ORDER — SODIUM CHLORIDE 0.9 % IR SOLN
Status: DC | PRN
  Administered 2023-04-04 (×2): 3000 mL

## 2023-04-04 MED ORDER — INSULIN ASPART 100 UNIT/ML IJ SOLN: 0.0000 [IU] | INTRAMUSCULAR | Status: DC | PRN

## 2023-04-04 MED ORDER — GLYCOPYRROLATE 0.2 MG/ML IJ SOLN
INTRAMUSCULAR | Status: AC
  Filled 2023-04-04: qty 1

## 2023-04-04 MED ORDER — PROMETHAZINE HCL 25 MG/ML IJ SOLN: 6.2500 mg | INTRAMUSCULAR | Status: DC | PRN

## 2023-04-04 MED ORDER — LIDOCAINE HCL 1 % IJ SOLN
INTRAMUSCULAR | Status: DC | PRN
  Administered 2023-04-04: 10 mL

## 2023-04-04 MED ORDER — ROCURONIUM BROMIDE 10 MG/ML (PF) SYRINGE
PREFILLED_SYRINGE | INTRAVENOUS | Status: DC | PRN
  Administered 2023-04-04: 40 mg via INTRAVENOUS

## 2023-04-04 MED ORDER — DEXTROSE 50 % IV SOLN
INTRAVENOUS | Status: AC
  Filled 2023-04-04: qty 50

## 2023-04-04 MED ORDER — DEXAMETHASONE SODIUM PHOSPHATE 4 MG/ML IJ SOLN: 4.0000 mg | INTRAMUSCULAR | Status: DC

## 2023-04-04 MED ORDER — MIDAZOLAM HCL 2 MG/2ML IJ SOLN
INTRAMUSCULAR | Status: AC
  Filled 2023-04-04: qty 2

## 2023-04-04 MED ORDER — PROPOFOL 10 MG/ML IV BOLUS
INTRAVENOUS | Status: DC | PRN
  Administered 2023-04-04: 140 mg via INTRAVENOUS

## 2023-04-04 MED ORDER — MIDAZOLAM HCL 5 MG/5ML IJ SOLN
INTRAMUSCULAR | Status: DC | PRN
  Administered 2023-04-04: 2 mg via INTRAVENOUS

## 2023-04-04 MED ORDER — LEVONORGESTREL 20 MCG/DAY IU IUD
1.0000 | INTRAUTERINE_SYSTEM | INTRAUTERINE | Status: AC
  Administered 2023-04-04: 1 via INTRAUTERINE
  Filled 2023-04-04: qty 1

## 2023-04-04 MED ORDER — HYDROMORPHONE HCL 1 MG/ML IJ SOLN: 0.2500 mg | INTRAMUSCULAR | Status: DC | PRN

## 2023-04-04 MED ORDER — SUGAMMADEX SODIUM 200 MG/2ML IV SOLN
INTRAVENOUS | Status: DC | PRN
  Administered 2023-04-04 (×2): 200 mg via INTRAVENOUS

## 2023-04-04 MED ORDER — OXYCODONE HCL 5 MG/5ML PO SOLN: 5.0000 mg | Freq: Once | ORAL | Status: DC | PRN

## 2023-04-04 MED ORDER — ACETAMINOPHEN 500 MG PO TABS
500.0000 mg | ORAL_TABLET | ORAL | Status: AC
  Administered 2023-04-04: 500 mg via ORAL
  Filled 2023-04-04: qty 1

## 2023-04-04 MED ORDER — CHLORHEXIDINE GLUCONATE 0.12 % MT SOLN
15.0000 mL | Freq: Once | OROMUCOSAL | Status: AC
  Administered 2023-04-04: 15 mL via OROMUCOSAL

## 2023-04-04 MED ORDER — DEXAMETHASONE SODIUM PHOSPHATE 10 MG/ML IJ SOLN
INTRAMUSCULAR | Status: AC
  Filled 2023-04-04: qty 1

## 2023-04-04 MED ORDER — DEXTROSE 50 % IV SOLN
12.5000 g | INTRAVENOUS | Status: AC
  Administered 2023-04-04: 12.5 g via INTRAVENOUS

## 2023-04-04 MED ORDER — AMISULPRIDE (ANTIEMETIC) 5 MG/2ML IV SOLN: 10.0000 mg | Freq: Once | INTRAVENOUS | Status: DC | PRN

## 2023-04-04 MED ORDER — MEPERIDINE HCL 50 MG/ML IJ SOLN: 6.2500 mg | INTRAMUSCULAR | Status: DC | PRN

## 2023-04-04 MED ORDER — OXYCODONE HCL 5 MG PO TABS: 5.0000 mg | ORAL_TABLET | Freq: Once | ORAL | Status: DC | PRN

## 2023-04-04 MED ORDER — LIDOCAINE 2% (20 MG/ML) 5 ML SYRINGE
INTRAMUSCULAR | Status: DC | PRN
  Administered 2023-04-04: 60 mg via INTRAVENOUS

## 2023-04-04 MED ORDER — ROCURONIUM BROMIDE 10 MG/ML (PF) SYRINGE
PREFILLED_SYRINGE | INTRAVENOUS | Status: AC
  Filled 2023-04-04: qty 10

## 2023-04-04 MED ORDER — LACTATED RINGERS IV SOLN: INTRAVENOUS | Status: DC

## 2023-04-04 MED ORDER — ONDANSETRON HCL 4 MG/2ML IJ SOLN
INTRAMUSCULAR | Status: AC
  Filled 2023-04-04: qty 2

## 2023-04-04 MED ORDER — LIDOCAINE HCL (PF) 2 % IJ SOLN
INTRAMUSCULAR | Status: AC
  Filled 2023-04-04: qty 5

## 2023-04-04 MED ORDER — LIDOCAINE HCL (PF) 1 % IJ SOLN
INTRAMUSCULAR | Status: AC
  Filled 2023-04-04: qty 30

## 2023-04-04 MED ORDER — ONDANSETRON HCL 4 MG/2ML IJ SOLN
INTRAMUSCULAR | Status: DC | PRN
Start: 2023-04-04 — End: 2023-04-04
  Administered 2023-04-04: 4 mg via INTRAVENOUS

## 2023-04-04 MED ORDER — FENTANYL CITRATE (PF) 100 MCG/2ML IJ SOLN
INTRAMUSCULAR | Status: DC | PRN
  Administered 2023-04-04 (×2): 50 ug via INTRAVENOUS

## 2023-04-04 MED ORDER — FENTANYL CITRATE (PF) 100 MCG/2ML IJ SOLN
INTRAMUSCULAR | Status: AC
  Filled 2023-04-04: qty 2

## 2023-04-04 MED ORDER — SUCCINYLCHOLINE CHLORIDE 200 MG/10ML IV SOSY
PREFILLED_SYRINGE | INTRAVENOUS | Status: DC | PRN
  Administered 2023-04-04: 140 mg via INTRAVENOUS

## 2023-04-04 MED ORDER — SUCCINYLCHOLINE CHLORIDE 200 MG/10ML IV SOSY
PREFILLED_SYRINGE | INTRAVENOUS | Status: AC
  Filled 2023-04-04: qty 10

## 2023-04-04 MED ORDER — DEXAMETHASONE SODIUM PHOSPHATE 4 MG/ML IJ SOLN
INTRAMUSCULAR | Status: DC | PRN
  Administered 2023-04-04: 5 mg via INTRAVENOUS

## 2023-04-04 SURGICAL SUPPLY — 23 items
BAG COUNTER SPONGE SURGICOUNT (BAG) ×2 IMPLANT
BAG SPNG CNTER NS LX DISP (BAG) ×2
DEVICE MYOSURE LITE (MISCELLANEOUS) IMPLANT
DEVICE MYOSURE REACH (MISCELLANEOUS) IMPLANT
DILATOR CANAL MILEX (MISCELLANEOUS) IMPLANT
GAUZE 4X4 16PLY ~~LOC~~+RFID DBL (SPONGE) IMPLANT
GLOVE BIO SURGEON STRL SZ 6 (GLOVE) ×2 IMPLANT
GLOVE BIO SURGEON STRL SZ 6.5 (GLOVE) IMPLANT
GOWN STRL REUS W/ TWL LRG LVL3 (GOWN DISPOSABLE) ×2 IMPLANT
GOWN STRL REUS W/TWL LRG LVL3 (GOWN DISPOSABLE) ×2
IV NS IRRIG 3000ML ARTHROMATIC (IV SOLUTION) ×2 IMPLANT
KIT PROCEDURE FLUENT (KITS) IMPLANT
KIT TURNOVER KIT A (KITS) IMPLANT
LOOP CUTTING BIPOLAR 21FR (ELECTRODE) IMPLANT
MYOSURE XL FIBROID (MISCELLANEOUS)
Mirena IUD IMPLANT
PACK VAGINAL WOMENS (CUSTOM PROCEDURE TRAY) ×2 IMPLANT
PAD OB MATERNITY 4.3X12.25 (PERSONAL CARE ITEMS) IMPLANT
PAD POSITIONING PINK XL (MISCELLANEOUS) IMPLANT
SEAL ROD LENS SCOPE MYOSURE (ABLATOR) IMPLANT
SYSTEM TISS REMOVAL MYOSURE XL (MISCELLANEOUS) IMPLANT
TOWEL OR 17X26 10 PK STRL BLUE (TOWEL DISPOSABLE) ×2 IMPLANT
WATER STERILE IRR 500ML POUR (IV SOLUTION) ×2 IMPLANT

## 2023-04-04 NOTE — Interval H&P Note (Signed)
History and Physical Interval Note:  04/04/2023 6:24 AM  Zoe Cobb  has presented today for surgery, with the diagnosis of ENDOMETRIAL CANCER.  The various methods of treatment have been discussed with the patient and family. After consideration of risks, benefits and other options for treatment, the patient has consented to  Procedure(s): DILATATION & CURETTAGE/HYSTEROSCOPY WITH MYOSURE (N/A) INTRAUTERINE DEVICE (IUD) INSERTION MIRENA, TILT TEST (N/A) as a surgical intervention.  The patient's history has been reviewed, patient examined, no change in status, stable for surgery.  I have reviewed the patient's chart and labs.  Questions were answered to the patient's satisfaction.     Carver Fila

## 2023-04-04 NOTE — Transfer of Care (Signed)
Immediate Anesthesia Transfer of Care Note  Patient: Zoe Cobb  Procedure(s) Performed: DILATATION & CURETTAGE/HYSTEROSCOPY WITH MYOSURE (Vagina ) INTRAUTERINE DEVICE (IUD) INSERTION MIRENA, TILT TEST (Cervix)  Patient Location: PACU  Anesthesia Type:General  Level of Consciousness: awake and alert   Airway & Oxygen Therapy: Patient Spontanous Breathing  Post-op Assessment: Report given to RN and Post -op Vital signs reviewed and stable  Post vital signs: Reviewed and stable  Last Vitals:  Vitals Value Taken Time  BP 104/61 04/04/23 0931  Temp    Pulse    Resp 13 04/04/23 0933  SpO2    Vitals shown include unfiled device data.  Last Pain:  Vitals:   04/04/23 0600  TempSrc: Oral  PainSc:          Complications: No notable events documented.

## 2023-04-04 NOTE — Discharge Instructions (Addendum)
AFTER SURGERY INSTRUCTIONS   Return to work: 1-2 days if applicable   Activity: 1. Be up and out of the bed during the day.  Take a nap if needed.  You may walk up steps but be careful and use the hand rail.  Stair climbing will tire you more than you think, you may need to stop part way and rest.    2. No lifting or straining for 1 week over 10 pounds. No pushing, pulling, straining for 1 week.   3. No driving for minimum 24 hours after surgery if you were cleared to drive before surgery.  Do not drive if you are taking narcotic pain medicine and make sure that your reaction time has returned.    4. You can shower as soon as the next day after surgery. Shower daily. No tub baths or submerging your body in water for 2 weeks after surgery.    5. No sexual activity and nothing in the vagina for 2 weeks.   6. You may experience vaginal spotting and discharge after surgery.  The spotting is normal but if you experience heavy bleeding, call our office.   7. Take Tylenol for pain if you are able to take these medication and only use narcotic pain medication for severe pain not relieved by the Tylenol.  Monitor your Tylenol intake to a max of 4,000 mg in a 24 hour period.    Diet: 1. Low sodium Heart Healthy Diet is recommended but you are cleared to resume your normal (before surgery) diet after your procedure.   2. It is safe to use a laxative, such as Miralax or Colace, if you have difficulty moving your bowels.    Wound Care: 1. Keep clean and dry.  Shower daily.   Reasons to call the Doctor: Fever - Oral temperature greater than 100.4 degrees Fahrenheit Foul-smelling vaginal discharge Difficulty urinating Nausea and vomiting Increased pain at the site of the incision that is unrelieved with pain medicine. Difficulty breathing with or without chest pain New calf pain especially if only on one side Sudden, continuing increased vaginal bleeding with or without clots.   Contacts: For  questions or concerns you should contact:   Dr. Eugene Garnet at [phone]   Warner Mccreedy, NP at [phone]   After Hours: call [phone] and have the GYN Oncologist paged/contacted (after 5 pm or on the weekends).   Messages sent via mychart are for non-urgent matters and are not responded to after hours so for urgent needs, please call the after hours number.

## 2023-04-04 NOTE — Anesthesia Preprocedure Evaluation (Addendum)
Anesthesia Evaluation  Patient identified by MRN, date of birth, ID band Patient awake    Reviewed: Allergy & Precautions, NPO status , Patient's Chart, lab work & pertinent test results  Airway Mallampati: III  TM Distance: >3 FB Neck ROM: Full    Dental  (+) Dental Advisory Given, Poor Dentition, Chipped, Missing   Pulmonary former smoker   Pulmonary exam normal breath sounds clear to auscultation       Cardiovascular hypertension, Pt. on medications +CHF  Normal cardiovascular exam+ dysrhythmias Atrial Fibrillation  Rhythm:Regular Rate:Normal     Neuro/Psych Multiple sclerosis     GI/Hepatic negative GI ROS,,,(+) Hepatitis -, C  Endo/Other  diabetes  Morbid obesity (BMI 84)  Renal/GU Renal disease     Musculoskeletal  (+) Arthritis , Osteoarthritis,    Abdominal  (+) + obese  Peds  Hematology  (+) Blood dyscrasia (Coumadin), anemia   Anesthesia Other Findings Day of surgery medications reviewed with the patient.  Reproductive/Obstetrics                             Anesthesia Physical Anesthesia Plan  ASA: 4  Anesthesia Plan: General   Post-op Pain Management: Tylenol PO (pre-op)*   Induction: Intravenous  PONV Risk Score and Plan: 3 and Dexamethasone, Ondansetron, Midazolam and Treatment may vary due to age or medical condition  Airway Management Planned: Oral ETT  Additional Equipment:   Intra-op Plan:   Post-operative Plan: Extubation in OR  Informed Consent: I have reviewed the patients History and Physical, chart, labs and discussed the procedure including the risks, benefits and alternatives for the proposed anesthesia with the patient or authorized representative who has indicated his/her understanding and acceptance.     Dental advisory given  Plan Discussed with: CRNA  Anesthesia Plan Comments: (See PAT note from 8/2 by Sherlie Ban PA-C )         Anesthesia Quick Evaluation

## 2023-04-04 NOTE — Progress Notes (Signed)
Post procedure call Daughter Amil Amen to provide update @ [phone] and call Clydie Braun @ [phone] to arrange transportation home

## 2023-04-04 NOTE — Anesthesia Procedure Notes (Signed)
Procedure Name: Intubation Date/Time: 04/04/2023 8:20 AM  Performed by: Vanessa Fullerton, CRNAPre-anesthesia Checklist: Emergency Drugs available, Suction available, Patient identified and Patient being monitored Patient Re-evaluated:Patient Re-evaluated prior to induction Oxygen Delivery Method: Circle system utilized Preoxygenation: Pre-oxygenation with 100% oxygen Induction Type: IV induction Ventilation: Mask ventilation without difficulty Laryngoscope Size: Glidescope and 4 Grade View: Grade I Tube type: Oral Tube size: 7.0 mm Number of attempts: 1 Airway Equipment and Method: Video-laryngoscopy Placement Confirmation: ETT inserted through vocal cords under direct vision, positive ETCO2 and breath sounds checked- equal and bilateral Secured at: 21 cm Tube secured with: Tape Dental Injury: Teeth and Oropharynx as per pre-operative assessment  Comments: Easy mask and glidescope

## 2023-04-04 NOTE — Op Note (Signed)
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 Cervix normal on speculum exam. Uterus sounds to 9 cm. On hysteroscopy, multiple areas with fibroid and polypoid tissue within the endometrium.  Mirena IUD lot #[id number].  Procedure: The patient was identified in the preoperative holding area. Informed consent was signed on the chart. Patient was seen history was reviewed and exam was performed.   The patient was then taken to the operating room and placed in the supine position with SCD hose on.  General anesthesia was then induced without difficulty. She was then placed in the dorsolithotomy position. Shoulder blocks were placed and the patient was secured to the bed with padding and tape across her chest.  The perineum was prepped with Betadine. The vagina was prepped with Betadine. The patient was then draped after the prep was dried.   Timeout was performed the patient, procedure, antibiotic, allergy, and length of procedure.   A Tilt test was performed. While the patient was able to be ventilated during the Tilt test, it became challenging to ventilate her during the procedure itself (with the bed flat).  The speculum was placed in the vagina. The single tooth tenaculum was placed on the anterior lip of the cervix. 10 cc of 0.25% marcaine was injected as a paracervical block. The uterine sound was placed in the cervix and advanced to the fundus. The cervix was successively  dilated using pratts dilators to 46F. The Myosure hysteroscope was inserted into the uterus with findings noted above. Given patient's position on the bed, I could not advance the camera all the way to the fundus (thus did not visualize bilateral tubal ostia). The Myosure Reach was then used to resected multiple areas of polypoid tissue within the mid and lower uterus body. This specimen was collected and sent to pathology.   The hysteroscope was removed from the uterus. The Mirena IUD was inserted to the fundus, pulled back, deployed, and inserted back to the fundus. The inserter was then removed and the strings cut at 4-5 cm. The tenaculum was removed and hemostasis was observed. Bimanual massage was performed.   The vagina was irrigated.  All instrument, suture, laparotomy, Ray-Tec, and needle counts were correct x2. The patient tolerated the procedure well and was taken recovery room in stable condition.   Carver Fila, MD

## 2023-04-04 NOTE — Anesthesia Postprocedure Evaluation (Signed)
Anesthesia Post Note  Patient: Zoe Cobb  Procedure(s) Performed: DILATATION & CURETTAGE/HYSTEROSCOPY WITH MYOSURE (Vagina ) INTRAUTERINE DEVICE (IUD) INSERTION MIRENA, TILT TEST (Cervix)     Patient location during evaluation: PACU Anesthesia Type: General Level of consciousness: awake and alert Pain management: pain level controlled Vital Signs Assessment: post-procedure vital signs reviewed and stable Respiratory status: spontaneous breathing, nonlabored ventilation and respiratory function stable Cardiovascular status: blood pressure returned to baseline and stable Postop Assessment: no apparent nausea or vomiting Anesthetic complications: no   No notable events documented.  Last Vitals:  Vitals:   04/04/23 1000 04/04/23 1015  BP: 97/64 105/65  Pulse: (!) 105 60  Resp: 16   Temp:  (!) 36.3 C  SpO2: 93% 94%    Last Pain:  Vitals:   04/04/23 1015  TempSrc:   PainSc: 0-No pain                 Lowella Curb

## 2023-04-05 ENCOUNTER — Encounter (HOSPITAL_COMMUNITY): Payer: Self-pay | Admitting: Gynecologic Oncology

## 2023-04-05 ENCOUNTER — Telehealth: Payer: Self-pay | Admitting: *Deleted

## 2023-04-05 ENCOUNTER — Ambulatory Visit
Admission: RE | Admit: 2023-04-05 | Discharge: 2023-04-05 | Disposition: A | Discharge status: Home / Self Care | Payer: 59 | Source: Ambulatory Visit | Attending: Adult Health | Admitting: Adult Health

## 2023-04-05 ENCOUNTER — Other Ambulatory Visit: Payer: Self-pay | Admitting: Adult Health

## 2023-04-05 DIAGNOSIS — Z139 Encounter for screening, unspecified: Secondary | ICD-10-CM

## 2023-04-05 NOTE — Telephone Encounter (Signed)
PC to patient, POD 1 - D&C on 04/04/23.  Patient states she is fine, is "out & about."  She states she is eating & drinking well, urinating well with no dysuria, no vaginal bleeding.  Patient reports BM this morning.  Denies any fever, chills or pain.  Patient states she has restarted her coumadin this morning.  Patient aware of upcoming appointments.  Patient informed to contact this office with any questions/concerns, [phone].  Patient verbalizes understanding.

## 2023-04-07 ENCOUNTER — Other Ambulatory Visit: Payer: Self-pay

## 2023-04-07 ENCOUNTER — Inpatient Hospital Stay (HOSPITAL_COMMUNITY)
Admission: EM | Admit: 2023-04-07 | Discharge: 2023-04-11 | DRG: 744 | Disposition: A | Discharge status: Home / Self Care | Payer: 59 | Attending: Internal Medicine | Admitting: Internal Medicine

## 2023-04-07 ENCOUNTER — Encounter (HOSPITAL_COMMUNITY): Payer: Self-pay

## 2023-04-07 ENCOUNTER — Inpatient Hospital Stay (HOSPITAL_COMMUNITY): Payer: 59

## 2023-04-07 DIAGNOSIS — Z6841 Body Mass Index (BMI) 40.0 and over, adult: Secondary | ICD-10-CM | POA: Diagnosis not present

## 2023-04-07 DIAGNOSIS — Z8249 Family history of ischemic heart disease and other diseases of the circulatory system: Secondary | ICD-10-CM | POA: Diagnosis not present

## 2023-04-07 DIAGNOSIS — I480 Paroxysmal atrial fibrillation: Secondary | ICD-10-CM | POA: Diagnosis present

## 2023-04-07 DIAGNOSIS — Z833 Family history of diabetes mellitus: Secondary | ICD-10-CM

## 2023-04-07 DIAGNOSIS — C541 Malignant neoplasm of endometrium: Secondary | ICD-10-CM | POA: Diagnosis not present

## 2023-04-07 DIAGNOSIS — Z7984 Long term (current) use of oral hypoglycemic drugs: Secondary | ICD-10-CM | POA: Diagnosis not present

## 2023-04-07 DIAGNOSIS — Z87891 Personal history of nicotine dependence: Secondary | ICD-10-CM | POA: Diagnosis not present

## 2023-04-07 DIAGNOSIS — Z7901 Long term (current) use of anticoagulants: Secondary | ICD-10-CM

## 2023-04-07 DIAGNOSIS — Z7401 Bed confinement status: Secondary | ICD-10-CM

## 2023-04-07 DIAGNOSIS — B1920 Unspecified viral hepatitis C without hepatic coma: Secondary | ICD-10-CM | POA: Diagnosis present

## 2023-04-07 DIAGNOSIS — D6832 Hemorrhagic disorder due to extrinsic circulating anticoagulants: Secondary | ICD-10-CM | POA: Diagnosis present

## 2023-04-07 DIAGNOSIS — Z8042 Family history of malignant neoplasm of prostate: Secondary | ICD-10-CM

## 2023-04-07 DIAGNOSIS — E876 Hypokalemia: Secondary | ICD-10-CM | POA: Diagnosis present

## 2023-04-07 DIAGNOSIS — N950 Postmenopausal bleeding: Secondary | ICD-10-CM | POA: Diagnosis present

## 2023-04-07 DIAGNOSIS — D509 Iron deficiency anemia, unspecified: Secondary | ICD-10-CM | POA: Diagnosis present

## 2023-04-07 DIAGNOSIS — I5032 Chronic diastolic (congestive) heart failure: Secondary | ICD-10-CM | POA: Diagnosis present

## 2023-04-07 DIAGNOSIS — K5732 Diverticulitis of large intestine without perforation or abscess without bleeding: Secondary | ICD-10-CM | POA: Diagnosis present

## 2023-04-07 DIAGNOSIS — I4891 Unspecified atrial fibrillation: Secondary | ICD-10-CM | POA: Diagnosis not present

## 2023-04-07 DIAGNOSIS — E119 Type 2 diabetes mellitus without complications: Secondary | ICD-10-CM | POA: Diagnosis present

## 2023-04-07 DIAGNOSIS — I110 Hypertensive heart disease with heart failure: Secondary | ICD-10-CM | POA: Diagnosis present

## 2023-04-07 DIAGNOSIS — M1990 Unspecified osteoarthritis, unspecified site: Secondary | ICD-10-CM | POA: Diagnosis present

## 2023-04-07 DIAGNOSIS — Z9889 Other specified postprocedural states: Secondary | ICD-10-CM

## 2023-04-07 DIAGNOSIS — Z880 Allergy status to penicillin: Secondary | ICD-10-CM | POA: Diagnosis not present

## 2023-04-07 DIAGNOSIS — T45515A Adverse effect of anticoagulants, initial encounter: Secondary | ICD-10-CM | POA: Diagnosis present

## 2023-04-07 DIAGNOSIS — I9589 Other hypotension: Secondary | ICD-10-CM | POA: Diagnosis present

## 2023-04-07 DIAGNOSIS — D538 Other specified nutritional anemias: Secondary | ICD-10-CM | POA: Diagnosis present

## 2023-04-07 DIAGNOSIS — R0981 Nasal congestion: Secondary | ICD-10-CM

## 2023-04-07 DIAGNOSIS — G35 Multiple sclerosis: Secondary | ICD-10-CM | POA: Diagnosis present

## 2023-04-07 DIAGNOSIS — Z79899 Other long term (current) drug therapy: Secondary | ICD-10-CM | POA: Diagnosis not present

## 2023-04-07 DIAGNOSIS — D259 Leiomyoma of uterus, unspecified: Secondary | ICD-10-CM | POA: Diagnosis present

## 2023-04-07 DIAGNOSIS — N939 Abnormal uterine and vaginal bleeding, unspecified: Secondary | ICD-10-CM | POA: Diagnosis present

## 2023-04-07 DIAGNOSIS — Z7409 Other reduced mobility: Secondary | ICD-10-CM | POA: Diagnosis present

## 2023-04-07 DIAGNOSIS — Z830 Family history of human immunodeficiency virus [HIV] disease: Secondary | ICD-10-CM

## 2023-04-07 DIAGNOSIS — Z993 Dependence on wheelchair: Secondary | ICD-10-CM

## 2023-04-07 DIAGNOSIS — D62 Acute posthemorrhagic anemia: Secondary | ICD-10-CM | POA: Diagnosis not present

## 2023-04-07 DIAGNOSIS — Z841 Family history of disorders of kidney and ureter: Secondary | ICD-10-CM

## 2023-04-07 DIAGNOSIS — Z975 Presence of (intrauterine) contraceptive device: Secondary | ICD-10-CM

## 2023-04-07 DIAGNOSIS — Z800 Family history of malignant neoplasm of digestive organs: Secondary | ICD-10-CM

## 2023-04-07 DIAGNOSIS — E538 Deficiency of other specified B group vitamins: Secondary | ICD-10-CM | POA: Diagnosis present

## 2023-04-07 LAB — PREPARE RBC (CROSSMATCH)

## 2023-04-07 LAB — COMPREHENSIVE METABOLIC PANEL
ALT: 12 U/L (ref 0–44)
AST: 18 U/L (ref 15–41)
Albumin: 2.4 g/dL — ABNORMAL LOW (ref 3.5–5.0)
Alkaline Phosphatase: 59 U/L (ref 38–126)
Anion gap: 10 (ref 5–15)
BUN: 32 mg/dL — ABNORMAL HIGH (ref 8–23)
CO2: 22 mmol/L (ref 22–32)
Calcium: 7.6 mg/dL — ABNORMAL LOW (ref 8.9–10.3)
Chloride: 105 mmol/L (ref 98–111)
Creatinine, Ser: 0.9 mg/dL (ref 0.44–1.00)
GFR, Estimated: >60 mL/min (ref >60)
Glucose, Bld: 146 mg/dL — ABNORMAL HIGH (ref 70–99)
Potassium: 4.1 mmol/L (ref 3.5–5.1)
Sodium: 137 mmol/L (ref 135–145)
Total Bilirubin: 0.6 mg/dL (ref 0.3–1.2)
Total Protein: 7.6 g/dL (ref 6.5–8.1)

## 2023-04-07 LAB — GLUCOSE, CAPILLARY
Glucose-Capillary: 139 mg/dL — ABNORMAL HIGH (ref 70–99)
Glucose-Capillary: 105 mg/dL — ABNORMAL HIGH (ref 70–99)

## 2023-04-07 LAB — HEMOGLOBIN AND HEMATOCRIT, BLOOD
HCT: 27.2 % — ABNORMAL LOW (ref 36.0–46.0)
Hemoglobin: 8 g/dL — ABNORMAL LOW (ref 12.0–15.0)

## 2023-04-07 LAB — PROTIME-INR
INR: 2.3 — ABNORMAL HIGH (ref 0.8–1.2)
INR: 1.2 (ref 0.8–1.2)
Prothrombin Time: 25.5 seconds — ABNORMAL HIGH (ref 11.4–15.2)
Prothrombin Time: 15.2 seconds (ref 11.4–15.2)

## 2023-04-07 LAB — CBC
HCT: 28.4 % — ABNORMAL LOW (ref 36.0–46.0)
Hemoglobin: 8.2 g/dL — ABNORMAL LOW (ref 12.0–15.0)
MCH: 21.6 pg — ABNORMAL LOW (ref 26.0–34.0)
MCHC: 28.9 g/dL — ABNORMAL LOW (ref 30.0–36.0)
MCV: 74.9 fL — ABNORMAL LOW (ref 80.0–100.0)
Platelets: 242 10*3/uL (ref 150–400)
RBC: 3.79 MIL/uL — ABNORMAL LOW (ref 3.87–5.11)
RDW: 26.6 % — ABNORMAL HIGH (ref 11.5–15.5)
WBC: 8.2 10*3/uL (ref 4.0–10.5)
nRBC: 0 % (ref 0.0–0.2)

## 2023-04-07 LAB — URINALYSIS, ROUTINE W REFLEX MICROSCOPIC
Bilirubin Urine: NEGATIVE
Glucose, UA: NEGATIVE mg/dL
Hgb urine dipstick: NEGATIVE
Ketones, ur: 5 mg/dL — AB
Leukocytes,Ua: NEGATIVE
Nitrite: NEGATIVE
Protein, ur: NEGATIVE mg/dL
Specific Gravity, Urine: 1.03 (ref 1.005–1.030)
pH: 5 (ref 5.0–8.0)

## 2023-04-07 LAB — MRSA NEXT GEN BY PCR, NASAL: MRSA by PCR Next Gen: NOT DETECTED

## 2023-04-07 LAB — LACTIC ACID, PLASMA: Lactic Acid, Venous: 1.8 mmol/L (ref 0.5–1.9)

## 2023-04-07 MED ORDER — SODIUM CHLORIDE 0.9% IV SOLUTION: Freq: Once | INTRAVENOUS | Status: AC

## 2023-04-07 MED ORDER — ONDANSETRON HCL 4 MG PO TABS: 4.0000 mg | ORAL_TABLET | Freq: Four times a day (QID) | ORAL | Status: DC | PRN

## 2023-04-07 MED ORDER — CHLORHEXIDINE GLUCONATE CLOTH 2 % EX PADS
6.0000 | MEDICATED_PAD | Freq: Every day | CUTANEOUS | Status: DC
  Administered 2023-04-07 – 2023-04-11 (×5): 6 via TOPICAL

## 2023-04-07 MED ORDER — ONDANSETRON HCL 4 MG/2ML IJ SOLN: 4.0000 mg | Freq: Four times a day (QID) | INTRAMUSCULAR | Status: DC | PRN

## 2023-04-07 MED ORDER — INSULIN ASPART 100 UNIT/ML IJ SOLN
0.0000 [IU] | Freq: Every day | INTRAMUSCULAR | Status: DC
  Filled 2023-04-07: qty 0.05

## 2023-04-07 MED ORDER — GUAIFENESIN-DM 100-10 MG/5ML PO SYRP
10.0000 mL | ORAL_SOLUTION | ORAL | Status: DC | PRN
  Administered 2023-04-08 (×4): 10 mL via ORAL
  Filled 2023-04-07 (×4): qty 10

## 2023-04-07 MED ORDER — SODIUM CHLORIDE 0.9 % IV SOLN
100.0000 mg | Freq: Two times a day (BID) | INTRAVENOUS | Status: DC
  Administered 2023-04-07 – 2023-04-08 (×2): 100 mg via INTRAVENOUS
  Filled 2023-04-07 (×3): qty 100

## 2023-04-07 MED ORDER — SODIUM CHLORIDE 0.9 % IV SOLN
2.0000 g | INTRAVENOUS | Status: DC
  Administered 2023-04-07: 2 g via INTRAVENOUS
  Filled 2023-04-07: qty 20

## 2023-04-07 MED ORDER — LACTATED RINGERS IV BOLUS
1000.0000 mL | Freq: Once | INTRAVENOUS | Status: AC
  Administered 2023-04-07: 1000 mL via INTRAVENOUS

## 2023-04-07 MED ORDER — TRAZODONE HCL 50 MG PO TABS: 25.0000 mg | ORAL_TABLET | Freq: Every evening | ORAL | Status: DC | PRN

## 2023-04-07 MED ORDER — SODIUM CHLORIDE 0.9 % IV SOLN: INTRAVENOUS | Status: DC

## 2023-04-07 MED ORDER — PROTHROMBIN COMPLEX CONC HUMAN 500 UNITS IV KIT
1616.0000 [IU] | PACK | Status: AC
  Administered 2023-04-07: 1616 [IU] via INTRAVENOUS
  Filled 2023-04-07: qty 1116

## 2023-04-07 MED ORDER — HYDROMORPHONE HCL 1 MG/ML IJ SOLN: 0.5000 mg | INTRAMUSCULAR | Status: DC | PRN

## 2023-04-07 MED ORDER — VITAMIN K1 10 MG/ML IJ SOLN
5.0000 mg | Freq: Once | INTRAVENOUS | Status: DC
  Filled 2023-04-07: qty 0.5

## 2023-04-07 MED ORDER — ORAL CARE MOUTH RINSE: 15.0000 mL | OROMUCOSAL | Status: DC | PRN

## 2023-04-07 MED ORDER — ACETAMINOPHEN 650 MG RE SUPP: 650.0000 mg | Freq: Four times a day (QID) | RECTAL | Status: DC | PRN

## 2023-04-07 MED ORDER — ACETAMINOPHEN 325 MG PO TABS: 650.0000 mg | ORAL_TABLET | Freq: Four times a day (QID) | ORAL | Status: DC | PRN

## 2023-04-07 MED ORDER — ALBUTEROL SULFATE (2.5 MG/3ML) 0.083% IN NEBU: 2.5000 mg | INHALATION_SOLUTION | RESPIRATORY_TRACT | Status: DC | PRN

## 2023-04-07 MED ORDER — VITAMIN K1 10 MG/ML IJ SOLN
10.0000 mg | INTRAVENOUS | Status: AC
  Administered 2023-04-07: 10 mg via INTRAVENOUS
  Filled 2023-04-07: qty 1

## 2023-04-07 MED ORDER — INSULIN ASPART 100 UNIT/ML IJ SOLN
0.0000 [IU] | Freq: Three times a day (TID) | INTRAMUSCULAR | Status: DC
  Filled 2023-04-07: qty 0.15

## 2023-04-07 MED ORDER — IOHEXOL 300 MG/ML  SOLN
100.0000 mL | Freq: Once | INTRAMUSCULAR | Status: AC | PRN
  Administered 2023-04-07: 100 mL via INTRAVENOUS

## 2023-04-07 NOTE — Plan of Care (Signed)
Discussed CT results with Dr. Aron Baba.  Recommends continue same management, agrees with blood transfusion and reversing INR.  Unlikely to be any surgical intervention.  In case of severe active bleed, could obtain CTA to evaluate for potential embolization target.

## 2023-04-07 NOTE — ED Notes (Signed)
ED TO INPATIENT HANDOFF REPORT  Name/Age/Gender Zoe Cobb 67 y.o. female  Code Status    Code Status Orders  (From admission, onward)           Start     Ordered   04/07/23 1334  Full code  Continuous       Question:  By:  Answer:  Consent: discussion documented in EHR   04/07/23 1335           Code Status History     Date Active Date Inactive Code Status Order ID Comments User Context   04/04/2023 0542 04/04/2023 1552 Full Code [id number]  Doylene Bode, NP Inpatient   03/12/2023 1023 03/12/2023 2033 Full Code [id number]  Lorriane Shire, MD Inpatient   03/01/2023 1501 03/03/2023 1807 Full Code [id number]  Bobette Mo, MD ED   11/07/2022 0954 11/09/2022 1634 Full Code [id number]  Jonah Blue, MD Inpatient   09/09/2022 1743 09/12/2022 0203 Full Code [id number]  Champ Mungo, DO ED   11/01/2021 1500 11/04/2021 0209 Full Code [id number]  Bobette Mo, MD ED   08/25/2021 1337 08/30/2021 0038 Full Code [id number]  Orland Mustard, MD ED   12/12/2020 1147 12/17/2020 0714 Full Code [id number]  Dolan Amen, MD ED   10/12/2016 1539 10/18/2016 1903 Full Code [id number]  Berton Bon, MD Inpatient   09/27/2016 1550 09/30/2016 1956 Full Code [id number]  Raliegh Ip, DO Inpatient   08/28/2016 1409 08/30/2016 1944 Full Code [id number]  Beaulah Dinning, MD ED       Home/SNF/Other Home  Chief Complaint Acute blood loss anemia [D62]  Level of Care/Admitting Diagnosis ED Disposition     ED Disposition  Admit   Condition  --   Comment  Hospital Area: [facility] [[id number]]  Level of Care: Stepdown [14]  Admit to SDU based on following criteria: Severe physiological/psychological symptoms:  Any diagnosis requiring assessment & intervention at least every 4 hours on an ongoing basis to obtain desired patient outcomes including stability and rehabilitation  May admit patient to [facility] or [facility] if equivalent level of care is available::  Yes  Covid Evaluation: Asymptomatic - no recent exposure (last 10 days) testing not required  Diagnosis: Acute blood loss anemia [235573]  Admitting Physician: Maryln Gottron [[id number]]  Attending Physician: Olexa.Dam, MIR Jaxson.Roy [[id number]]  Certification:: I certify this patient will need inpatient services for at least 2 midnights  Expected Medical Readiness: 04/10/2023          Medical History Past Medical History:  Diagnosis Date   Arthritis    CHF (congestive heart failure) (HCC)    Diabetes mellitus without complication (HCC)    metformin   Dysrhythmia    A-fib   Hypertension    Morbid obesity (HCC)    Multiple sclerosis (HCC)    asymptomatic   Paroxysmal atrial fibrillation (HCC)     Allergies Allergies  Allergen Reactions   Penicillins Swelling    Has patient had a PCN reaction causing immediate rash, facial/tongue/throat swelling, SOB or lightheadedness with hypotension: Yes Has patient had a PCN reaction causing severe rash involving mucus membranes or skin necrosis: No Has patient had a PCN reaction that required hospitalization No Has patient had a PCN reaction occurring within the last 10 years: No If all of the above answers are "NO", then may proceed with Cephalosporin use.     IV Location/Drains/Wounds Patient Lines/Drains/Airways Status     Active Line/Drains/Airways     Name  Placement date Placement time Site Days   Peripheral IV 04/07/23 22 G Anterior;Left Hand 04/07/23  0900  Hand  less than 1   Peripheral IV 04/07/23 20 G Anterior;Right Wrist 04/07/23  1330  Wrist  less than 1            Labs/Imaging Results for orders placed or performed during the hospital encounter of 04/07/23 (from the past 48 hour(s))  CBC     Status: Abnormal   Collection Time: 04/07/23  9:58 AM  Result Value Ref Range   WBC 8.2 4.0 - 10.5 K/uL   RBC 3.79 (L) 3.87 - 5.11 MIL/uL   Hemoglobin 8.2 (L) 12.0 - 15.0 g/dL    Comment: Reticulocyte Hemoglobin testing may  be clinically indicated, consider ordering this additional test [id number]    HCT 28.4 (L) 36.0 - 46.0 %   MCV 74.9 (L) 80.0 - 100.0 fL   MCH 21.6 (L) 26.0 - 34.0 pg   MCHC 28.9 (L) 30.0 - 36.0 g/dL   RDW 78.2 (H) 95.6 - 21.3 %   Platelets 242 150 - 400 K/uL   nRBC 0.0 0.0 - 0.2 %    Comment: Performed at [facility], [address], [place], [place] [address]  Protime-INR     Status: Abnormal   Collection Time: 04/07/23  9:58 AM  Result Value Ref Range   Prothrombin Time 25.5 (H) 11.4 - 15.2 seconds   INR 2.3 (H) 0.8 - 1.2    Comment: (NOTE) INR goal varies based on device and disease states. Performed at [facility], [address] [place], [place] [address]   Comprehensive metabolic panel     Status: Abnormal   Collection Time: 04/07/23  9:58 AM  Result Value Ref Range   Sodium 137 135 - 145 mmol/L   Potassium 4.1 3.5 - 5.1 mmol/L   Chloride 105 98 - 111 mmol/L   CO2 22 22 - 32 mmol/L   Glucose, Bld 146 (H) 70 - 99 mg/dL    Comment: Glucose reference range applies only to samples taken after fasting for at least 8 hours.   BUN 32 (H) 8 - 23 mg/dL   Creatinine, Ser 2.95 0.44 - 1.00 mg/dL   Calcium 7.6 (L) 8.9 - 10.3 mg/dL   Total Protein 7.6 6.5 - 8.1 g/dL   Albumin 2.4 (L) 3.5 - 5.0 g/dL   AST 18 15 - 41 U/L   ALT 12 0 - 44 U/L   Alkaline Phosphatase 59 38 - 126 U/L   Total Bilirubin 0.6 0.3 - 1.2 mg/dL   GFR, Estimated >28 >41 mL/min    Comment: (NOTE) Calculated using the CKD-EPI Creatinine Equation (2021)    Anion gap 10 5 - 15    Comment: Performed at [facility], [address] [place], [place] [address]  Type and screen [facility]     Status: None   Collection Time: 04/07/23  9:58 AM  Result Value Ref Range   ABO/RH(D) A POS    Antibody Screen NEG    Sample Expiration      04/10/2023,2359 Performed at [facility], [address] [place], [place]  [address]    No results found.  Pending Labs Unresulted Labs (From admission, onward)     Start     Ordered   04/08/23 0500  Comprehensive metabolic panel  Tomorrow morning,   R        04/07/23 1335  04/08/23 0500  CBC  Tomorrow morning,   R        04/07/23 1335   04/08/23 0500  Protime-INR  Tomorrow morning,   R        04/07/23 1335   04/07/23 1406  Hemoglobin and hematocrit, blood  Now then every 8 hours,   R (with TIMED occurrences)      04/07/23 1405   04/07/23 1350  Lactic acid, plasma  (Lactic Acid)  STAT Now then every 3 hours,   R (with STAT occurrences)      04/07/23 1349   04/07/23 1344  Culture, blood (Routine X 2) w Reflex to ID Panel  BLOOD CULTURE X 2,   R (with TIMED occurrences)      04/07/23 1343   04/07/23 1321  Prepare RBC (crossmatch)  (Blood Administration Adult)  Once,   R       Question Answer Comment  # of Units 1 unit   Transfusion Indications Hemoglobin 8 gm/dL or less and orthopedic or cardiac surgery or pre-existing cardiac condition   Number of Units to Keep Ahead NO units ahead   If emergent release call blood bank [facility] [phone]      04/07/23 1321            Vitals/Pain Today's Vitals   04/07/23 1130 04/07/23 1237 04/07/23 1330 04/07/23 1358  BP: (!) 83/61 (!) 90/50 (!) 79/49   Pulse: 86 85 90   Resp: (!) 23 20 18    Temp:    98.5 F (36.9 C)  TempSrc:    Oral  SpO2: 99% 100% 100%   Weight:      Height:      PainSc:        Isolation Precautions No active isolations  Medications Medications  insulin aspart (novoLOG) injection 0-15 Units (has no administration in time range)  insulin aspart (novoLOG) injection 0-5 Units (has no administration in time range)  acetaminophen (TYLENOL) tablet 650 mg (has no administration in time range)    Or  acetaminophen (TYLENOL) suppository 650 mg (has no administration in time range)  HYDROmorphone (DILAUDID) injection 0.5-1 mg (has no administration in time range)  traZODone (DESYREL)  tablet 25 mg (has no administration in time range)  ondansetron (ZOFRAN) tablet 4 mg (has no administration in time range)    Or  ondansetron (ZOFRAN) injection 4 mg (has no administration in time range)  albuterol (PROVENTIL) (2.5 MG/3ML) 0.083% nebulizer solution 2.5 mg (has no administration in time range)  prothrombin complex conc human (KCENTRA) IVPB 1,616 Units (has no administration in time range)  phytonadione (VITAMIN K) 10 mg in dextrose 5 % 50 mL IVPB (has no administration in time range)  cefTRIAXone (ROCEPHIN) 2 g in sodium chloride 0.9 % 100 mL IVPB (2 g Intravenous New Bag/Given 04/07/23 1413)  doxycycline (VIBRAMYCIN) 100 mg in sodium chloride 0.9 % 250 mL IVPB (has no administration in time range)  0.9 %  sodium chloride infusion (has no administration in time range)  lactated ringers bolus 1,000 mL (0 mLs Intravenous Stopped 04/07/23 1338)  0.9 %  sodium chloride infusion (Manually program via Guardrails IV Fluids) ( Intravenous New Bag/Given 04/07/23 1403)  lactated ringers bolus 1,000 mL (1,000 mLs Intravenous New Bag/Given 04/07/23 1338)  iohexol (OMNIPAQUE) 300 MG/ML solution 100 mL (100 mLs Intravenous Contrast Given 04/07/23 1431)    Mobility non-ambulatory

## 2023-04-07 NOTE — ED Triage Notes (Signed)
Patient BIB [facility] from home. Began having vaginal bleeding last night. Patients aide said she has not seen blood clots this large before. Patient takes warfarin. Same thing happened 3 weeks ago. Complaining of lower abdominal pain.   EMS normal saline

## 2023-04-07 NOTE — H&P (Signed)
History and Physical  Zoe Cobb ZOX:[id number] DOB: Aug 30, 1955 DOA: 04/07/2023  PCP: Marletta Lor, NP   Chief Complaint: Vaginal bleeding  HPI: Zoe Cobb is a 67 y.o. female with medical history significant for morbid obesity, hypertension, non-insulin-dependent type 2 diabetes recent diagnosis of endometrial cancer who had D&C with Dr. Pricilla Holm of [facility] on 8/28 and is being admitted to the hospital with vaginal bleeding and acute on chronic blood loss anemia.  Patient states that she has been doing well since her procedure, she restarted her Coumadin as directed on 8/29.  She denies any nausea or vomiting, has been having diffuse lower abdominal pain which is new for her, started having bleeding last night, she blood copiously through the night, says that she has never bled this much, never seen such a large clots.  She denies any fevers or chills, any pain anywhere else.  ED Course: On evaluation in the emergency department, she has been afebrile, heart rate around 90 blood pressure has been as low as 84/54.  She was given 1 L of IV fluids, latest blood pressure is still 79/49.  She is saturating well on room air.  On my interview, states that she feels dizzy.  Denies chest pain.  CBC is remarkable for hemoglobin 8.3, down from 9.2 about 1 month ago.  INR 2.3, CMP unremarkable.  Review of Systems: Please see HPI for pertinent positives and negatives. A complete 10 system review of systems are otherwise negative.  Past Medical History:  Diagnosis Date   Arthritis    CHF (congestive heart failure) (HCC)    Diabetes mellitus without complication (HCC)    metformin   Dysrhythmia    A-fib   Hypertension    Morbid obesity (HCC)    Multiple sclerosis (HCC)    asymptomatic   Paroxysmal atrial fibrillation (HCC)    Past Surgical History:  Procedure Laterality Date   COLONOSCOPY WITH PROPOFOL N/A 12/15/2020   Procedure: COLONOSCOPY WITH PROPOFOL;  Surgeon: Meryl Dare, MD;   Location: [facility] ENDOSCOPY;  Service: Endoscopy;  Laterality: N/A;   DILATATION & CURETTAGE/HYSTEROSCOPY WITH MYOSURE N/A 04/04/2023   Procedure: DILATATION & CURETTAGE/HYSTEROSCOPY WITH MYOSURE;  Surgeon: Carver Fila, MD;  Location: [facility] ORS;  Service: Gynecology;  Laterality: N/A;   HYSTEROSCOPY WITH D & C N/A 03/12/2023   Procedure: DIAGNOSTIC HYSTEROSCOPY, ENDOMETRIAL BIOPSY;  Surgeon: Lorriane Shire, MD;  Location: [facility] ORS;  Service: Gynecology;  Laterality: N/A;   INTRAUTERINE DEVICE (IUD) INSERTION N/A 04/04/2023   Procedure: INTRAUTERINE DEVICE (IUD) INSERTION MIRENA, TILT TEST;  Surgeon: Carver Fila, MD;  Location: [facility] ORS;  Service: Gynecology;  Laterality: N/A;    Social History:  reports that she quit smoking about 28 years ago. Her smoking use included cigarettes. She started smoking about 37 years ago. She has a 9 pack-year smoking history. She has never used smokeless tobacco. She reports that she does not drink alcohol and does not use drugs.   Allergies  Allergen Reactions   Penicillins Swelling    Has patient had a PCN reaction causing immediate rash, facial/tongue/throat swelling, SOB or lightheadedness with hypotension: Yes Has patient had a PCN reaction causing severe rash involving mucus membranes or skin necrosis: No Has patient had a PCN reaction that required hospitalization No Has patient had a PCN reaction occurring within the last 10 years: No If all of the above answers are "NO", then may proceed with Cephalosporin use.     Family History  Problem Relation Age of Onset  Hypertension Mother    Diabetes Mother    Stomach cancer Father    Kidney disease Sister    Diabetes Sister    Diabetes Sister    Diabetes Sister    Heart attack Brother    HIV/AIDS Brother    Diabetes Brother    Prostate cancer Brother    Colon cancer Neg Hx    Breast cancer Neg Hx    Ovarian cancer Neg Hx    Endometrial cancer Neg Hx    Pancreatic cancer Neg Hx       Prior to Admission medications   Medication Sig Start Date End Date Taking? Authorizing Provider  traMADol (ULTRAM) 50 MG tablet Take 50 mg by mouth every 8 (eight) hours as needed. 04/04/23  Yes [provider]  acetaminophen (TYLENOL) 325 MG tablet Take 650 mg by mouth every 6 (six) hours as needed for moderate pain.    [provider]  cyanocobalamin (VITAMIN B12) 500 MCG tablet Take 1 tablet (500 mcg total) by mouth daily. 03/03/23   Marinda Elk, MD  ferrous sulfate 325 (65 FE) MG EC tablet Take 1 tablet (325 mg total) by mouth 2 (two) times daily. Patient taking differently: Take 325 mg by mouth daily with breakfast. 11/09/22 11/09/23  Danford, Earl Lites, MD  isosorbide mononitrate (IMDUR) 60 MG 24 hr tablet Take 60 mg by mouth daily. 11/10/22   [provider]  metFORMIN (GLUCOPHAGE) 1000 MG tablet Take 1,000 mg by mouth 2 (two) times daily. 02/15/23   [provider]  metoprolol succinate (TOPROL-XL) 50 MG 24 hr tablet Take 50 mg by mouth daily. Take with or immediately following a meal.    [provider]  Multiple Vitamins-Minerals (ONE A DAY WOMEN 50 PLUS PO) Take 1 tablet by mouth daily.    [provider]  torsemide (DEMADEX) 20 MG tablet Take 20 mg by mouth 2 (two) times daily. 09/29/22   [provider]  warfarin (COUMADIN) 5 MG tablet Take 1 tablet (5 mg total) by mouth See admin instructions. On Tues, and Thurs Patient taking differently: Take 2.5-5 mg by mouth See admin instructions. 2.5 mg on Thurs, all other days 5 mg 03/13/23   Marinda Elk, MD    Physical Exam: BP (!) 79/49 (BP Location: Right Arm)   Pulse 90   Temp 98.5 F (36.9 C) (Oral)   Resp 18   Ht 5\' 1"  (1.549 m)   Wt (!) 188 kg   SpO2 100%   BMI 78.31 kg/m   General:  Alert, oriented, calm, in no acute distress, feels a little dizzy.  Super morbidly obese. Eyes: EOMI, clear conjuctivae, white sclerea Neck: supple, no masses, trachea  mildline  Cardiovascular: RRR, no murmurs or rubs, no peripheral edema  Respiratory: Very difficult exam due to body habitus, but clear to auscultation bilaterally, no wheezes, no crackles  Abdomen: soft, diffusely tender in the lower quadrants, nondistended, normal bowel tones heard  GU exam deferred, ER provider notes active vaginal bleeding with large clots Skin: dry, no rashes  Musculoskeletal: no joint effusions, normal range of motion  Psychiatric: appropriate affect, normal speech  Neurologic: extraocular muscles intact, clear speech, moving all extremities with intact sensorium          Labs on Admission:  Basic Metabolic Panel: Recent Labs  Lab 04/07/23 0958  NA 137  K 4.1  CL 105  CO2 22  GLUCOSE 146*  BUN 32*  CREATININE 0.90  CALCIUM 7.6*  Liver Function Tests: Recent Labs  Lab 04/07/23 0958  AST 18  ALT 12  ALKPHOS 59  BILITOT 0.6  PROT 7.6  ALBUMIN 2.4*   No results for input(s): "LIPASE", "AMYLASE" in the last 168 hours. No results for input(s): "AMMONIA" in the last 168 hours. CBC: Recent Labs  Lab 04/07/23 0958  WBC 8.2  HGB 8.2*  HCT 28.4*  MCV 74.9*  PLT 242   Cardiac Enzymes: No results for input(s): "CKTOTAL", "CKMB", "CKMBINDEX", "TROPONINI" in the last 168 hours.  BNP (last 3 results) No results for input(s): "BNP" in the last 8760 hours.  ProBNP (last 3 results) No results for input(s): "PROBNP" in the last 8760 hours.  CBG: Recent Labs  Lab 04/04/23 0558 04/04/23 0652 04/04/23 0938  GLUCAP 66* 155* 98    Radiological Exams on Admission: No results found.  Assessment/Plan Brigette Burrous is a 67 y.o. female with medical history significant for morbid obesity, hypertension, non-insulin-dependent type 2 diabetes recent diagnosis of endometrial cancer who had D&C with Dr. Pricilla Holm of [facility] on 8/28 and is being admitted to the hospital with vaginal bleeding and acute on chronic blood loss anemia.   Acute blood loss  anemia-likely due to recent instrumentation, known bleeding from endometrial cancer, and resumption of her Coumadin. -Inpatient admission to stepdown unit -Given active bleeding, will give 1 unit PRBC -Trend hemoglobin every 8 hours -Will give Kcentra to reverse INR  Hypotension-presumed to be due to her acute blood loss, could be septic but given lack of fevers, leukocytosis or other evidence of infection I do not have a high suspicion for this.  Given her very large body habitus, blood pressures also may not be accurate.  However I am concerned that patient is dizzy, which may be an indication that she is truly hypotensive. -Bolus another liter LR -Start maintenance IV fluids -Blood culture x 2 -Check lactate -Discussed with Dr. Judeth Horn of critical care, who has seen in consultation -Start empiric IV Rocephin and IV doxycycline to cover for possible endometritis -Check CT abdomen pelvis with contrast when stable  Endometrial cancer-deemed not a surgical candidate, had recent D&C with IUD placement with Dr. Pricilla Holm on 8/28.  History of atrial fibrillation-this is her indication for anticoagulation, she denies prior history of VTE. -Will hold antihypertensives and anticoagulation  Super morbid obesity-BMI 78.31, complicating all aspects of care  Type 2 Diabetes-non-insulin-dependent -Plan for carb controlled diet when no longer n.p.o. -Hold metformin -Sliding scale insulin in the meantime  DVT prophylaxis: SCDs only    Code Status: Full Code  Consults called: Pulmonary critical care, EDP discussed with on-call gynecologic oncology.  Admission status: The appropriate patient status for this patient is INPATIENT. Inpatient status is judged to be reasonable and necessary in order to provide the required intensity of service to ensure the patient's safety. The patient's presenting symptoms, physical exam findings, and initial radiographic and laboratory data in the context of their  chronic comorbidities is felt to place them at high risk for further clinical deterioration. Furthermore, it is not anticipated that the patient will be medically stable for discharge from the hospital within 2 midnights of admission.    I certify that at the point of admission it is my clinical judgment that the patient will require inpatient hospital care spanning beyond 2 midnights from the point of admission due to high intensity of service, high risk for further deterioration and high frequency of surveillance required  Time spent: 65 minutes  Shalece Staffa Sharlette Dense MD  [facility] Pager [phone]  If 7PM-7AM, please contact night-coverage [email or web address] Password [email or web address]  04/07/2023, 1:50 PM

## 2023-04-07 NOTE — ED Provider Notes (Signed)
[place] EMERGENCY DEPARTMENT AT [facility] Provider Note   CSN: [id number] Arrival date & time: 04/07/23  4132     History  Chief Complaint  Patient presents with   Vaginal Bleeding    Zoe Cobb is a 67 y.o. female with history of newly diagnosed endometrial cancer, paroxysmal afib on coumadin, CHF (LV EF 55-60%, echo 2018), diabetes, HTN, multiple sclerosis, hepatitis C, who presents to the ER complaining of vaginal bleeding and lower abdominal pain. Had D&C with IUD placement by OBGYN on 8/28. States started having vaginal bleeding last night. Reports having "never seen blood clots this large before".    Vaginal Bleeding      Home Medications Prior to Admission medications   Medication Sig Start Date End Date Taking? Authorizing Provider  traMADol (ULTRAM) 50 MG tablet Take 50 mg by mouth every 8 (eight) hours as needed. 04/04/23  Yes [provider]  acetaminophen (TYLENOL) 325 MG tablet Take 650 mg by mouth every 6 (six) hours as needed for moderate pain.    [provider]  cyanocobalamin (VITAMIN B12) 500 MCG tablet Take 1 tablet (500 mcg total) by mouth daily. 03/03/23   Marinda Elk, MD  ferrous sulfate 325 (65 FE) MG EC tablet Take 1 tablet (325 mg total) by mouth 2 (two) times daily. Patient taking differently: Take 325 mg by mouth daily with breakfast. 11/09/22 11/09/23  Danford, Earl Lites, MD  isosorbide mononitrate (IMDUR) 60 MG 24 hr tablet Take 60 mg by mouth daily. 11/10/22   [provider]  metFORMIN (GLUCOPHAGE) 1000 MG tablet Take 1,000 mg by mouth 2 (two) times daily. 02/15/23   [provider]  metoprolol succinate (TOPROL-XL) 50 MG 24 hr tablet Take 50 mg by mouth daily. Take with or immediately following a meal.    [provider]  Multiple Vitamins-Minerals (ONE A DAY WOMEN 50 PLUS PO) Take 1 tablet by mouth daily.    [provider]  torsemide (DEMADEX) 20 MG tablet Take 20 mg  by mouth 2 (two) times daily. 09/29/22   [provider]  warfarin (COUMADIN) 5 MG tablet Take 1 tablet (5 mg total) by mouth See admin instructions. On Tues, and Thurs Patient taking differently: Take 2.5-5 mg by mouth See admin instructions. 2.5 mg on Thurs, all other days 5 mg 03/13/23   Marinda Elk, MD      Allergies    Penicillins    Review of Systems   Review of Systems  Genitourinary:  Positive for vaginal bleeding.  All other systems reviewed and are negative.   Physical Exam Updated Vital Signs BP (!) 79/49 (BP Location: Right Arm)   Pulse 90   Temp 98.5 F (36.9 C) (Oral)   Resp 18   Ht 5\' 1"  (1.549 m)   Wt (!) 188 kg   SpO2 100%   BMI 78.31 kg/m  Physical Exam Vitals and nursing note reviewed. Exam conducted with a chaperone present.  Constitutional:      Appearance: Normal appearance. She is morbidly obese.  HENT:     Head: Normocephalic and atraumatic.  Eyes:     Conjunctiva/sclera: Conjunctivae normal.  Cardiovascular:     Rate and Rhythm: Normal rate and regular rhythm.  Pulmonary:     Effort: Pulmonary effort is normal. No respiratory distress.     Breath sounds: Normal breath sounds.  Abdominal:     General: There is no distension.     Palpations: Abdomen is soft.  Tenderness: There is no abdominal tenderness.  Genitourinary:    Vagina: Bleeding present.     Comments: Exam significantly limited by body habitus. External exam significant for active moderate vaginal bleeding with bright red clots Skin:    General: Skin is warm and dry.  Neurological:     General: No focal deficit present.     Mental Status: She is alert.     ED Results / Procedures / Treatments   Labs (all labs ordered are listed, but only abnormal results are displayed) Labs Reviewed  CBC - Abnormal; Notable for the following components:      Result Value   RBC 3.79 (*)    Hemoglobin 8.2 (*)    HCT 28.4 (*)    MCV 74.9 (*)    MCH 21.6 (*)    MCHC 28.9  (*)    RDW 26.6 (*)    All other components within normal limits  PROTIME-INR - Abnormal; Notable for the following components:   Prothrombin Time 25.5 (*)    INR 2.3 (*)    All other components within normal limits  COMPREHENSIVE METABOLIC PANEL - Abnormal; Notable for the following components:   Glucose, Bld 146 (*)    BUN 32 (*)    Calcium 7.6 (*)    Albumin 2.4 (*)    All other components within normal limits  TYPE AND SCREEN  PREPARE RBC (CROSSMATCH)    EKG None  Radiology No results found.  Procedures .Critical Care  Performed by: Su Monks, PA-C Authorized by: Su Monks, PA-C   Critical care provider statement:    Critical care time (minutes):  30   Critical care was necessary to treat or prevent imminent or life-threatening deterioration of the following conditions:  Shock   Critical care was time spent personally by me on the following activities:  Development of treatment plan with patient or surrogate, discussions with consultants, evaluation of patient's response to treatment, examination of patient, ordering and review of laboratory studies, ordering and review of radiographic studies, ordering and performing treatments and interventions, pulse oximetry, re-evaluation of patient's condition and review of old charts   Care discussed with: admitting provider       Medications Ordered in ED Medications  0.9 %  sodium chloride infusion (Manually program via Guardrails IV Fluids) (has no administration in time range)  insulin aspart (novoLOG) injection 0-15 Units (has no administration in time range)  insulin aspart (novoLOG) injection 0-5 Units (has no administration in time range)  acetaminophen (TYLENOL) tablet 650 mg (has no administration in time range)    Or  acetaminophen (TYLENOL) suppository 650 mg (has no administration in time range)  HYDROmorphone (DILAUDID) injection 0.5-1 mg (has no administration in time range)  traZODone (DESYREL)  tablet 25 mg (has no administration in time range)  ondansetron (ZOFRAN) tablet 4 mg (has no administration in time range)    Or  ondansetron (ZOFRAN) injection 4 mg (has no administration in time range)  albuterol (PROVENTIL) (2.5 MG/3ML) 0.083% nebulizer solution 2.5 mg (has no administration in time range)  prothrombin complex conc human (KCENTRA) IVPB 1,500 Units (has no administration in time range)  phytonadione (VITAMIN K) 10 mg in dextrose 5 % 50 mL IVPB (has no administration in time range)  lactated ringers bolus 1,000 mL (0 mLs Intravenous Stopped 04/07/23 1338)  lactated ringers bolus 1,000 mL (1,000 mLs Intravenous New Bag/Given 04/07/23 1338)    ED Course/ Medical Decision Making/ A&P  Medical Decision Making Amount and/or Complexity of Data Reviewed Labs: ordered.  Risk Prescription drug management. Decision regarding hospitalization.   This patient is a 67 y.o. female  who presents to the ED for concern of vaginal bleeding.   Differential diagnoses prior to evaluation: The emergent differential diagnosis includes, but is not limited to,  Abnormal uterine bleeding, vaginal/cervical trauma, post-operative complication, malignancy. This is not an exhaustive differential.   Past Medical History / Co-morbidities / Social History: newly diagnosed endometrial cancer, paroxysmal afib on coumadin, CHF (LV EF 55-60%, echo 2018), diabetes, HTN, multiple sclerosis, hepatitis C  Additional history: Chart reviewed. Pertinent results include: Seen by OBGYN on 8/5 for post menopausal bleeding. Underwent hysteroscopy and endometrial biopsy, pathology showed endometrioid carcinoma, referred to GYN-Onc. They felt she was not a surgical candidate at this time. They agreed to hysteroscopy and IUD placement which occurred on 8/28. Telephone note states patient restarted her coumadin the morning of 8/29.   Physical Exam: Physical exam performed. The pertinent  findings include: Soft blood pressures, maintaining normal MAP. GU exam difficult due to body habitus, but external exam significant for active vaginal bleeding with large clots.   Lab Tests/Imaging studies: I personally interpreted labs/imaging and the pertinent results include:  Hemoglobin 8.2, dropped 1 point in 4 weeks, however appears to be around patient's baseline. CMP grossly unremarkable. PT-INR supratherapeutic at 25.5 and 2.3 respectively. Type and screen ordered.   Medications: I ordered medication including IVF.  I have reviewed the patients home medicines and have made adjustments as needed.  Consultations obtained: I consulted with Dr Gloris Manchester with gynecologic oncology. He agrees with admission to hospitalist for observation and trending H&H. He will notify patient's surgeon Dr Pricilla Holm about her presentation to the ER.   I consulted with hospitalist Dr Erenest Blank who will admit. We agreed to start patient on 1 unit PRBC's and additional fluid bolus. He will evaluate patient and determine whether or not patient is stable for the floor.  Bed request placed at 1340.   Disposition: After consideration of the diagnostic results and the patients response to treatment, I feel that patient would benefit from medical admission for vaginal hemorrhage with endometrial cancer, supratherapeutic INR 2.3 on coumadin for paroxysmal afib.   Final Clinical Impression(s) / ED Diagnoses Final diagnoses:  Vaginal bleeding  Endometrial cancer (HCC)    Rx / DC Orders ED Discharge Orders     None      Portions of this report may have been transcribed using voice recognition software. Every effort was made to ensure accuracy; however, inadvertent computerized transcription errors may be present.    Jeanella Flattery 04/07/23 1342    Wynetta Fines, MD 04/07/23 1556

## 2023-04-07 NOTE — Consult Note (Signed)
NAME:  Zoe Cobb, MRN:  [id number], DOB:  1956/03/02, LOS: 0 ADMISSION DATE:  04/07/2023, CONSULTATION DATE:  04/07/23  REFERRING MD:  TRH, CHIEF COMPLAINT:  dizzy   History of Present Illness:  67 year old woman with recent vaginal bleeding found and diagnosed with low-grade endometrioid endometrial adenocarcinoma with recent D&C and IUD placement 8/28 presents with severe vaginal bleeding.  Takes warfarin for A-fib.  INR 2.3.  Significant increase in bleeding over the last 24 hours per her report.  Previously was spotting.  Felt a little dizzy so came to the hospital.  Blood pressure is all over the place suspect not accurate.  Checking wrist.  No good spot given habitus.  She is essentially asymptomatic other than the lightheadedness which could be related to anemia but certainly could be related to low blood pressure.  Labs largely at baseline.  Hemoglobin only down 1 g from 1 month prior.  With ongoing preceding blood loss.  Pertinent  Medical History  Morbid obesity, recent IUD placement/D&C  Significant Hospital Events: Including procedures, antibiotic start and stop dates in addition to other pertinent events   8/31 admitted with significant vaginal bleeding over the last day, low blood pressures on blood pressure cuff prompting consultation  Interim History / Subjective:    Objective   Blood pressure (!) 126/53, pulse (!) 101, temperature (!) 97.4 F (36.3 C), resp. rate 17, height 5\' 1"  (1.549 m), weight (!) 188 kg, SpO2 96%.       No intake or output data in the 24 hours ending 04/07/23 1828 Filed Weights   04/07/23 0923 04/07/23 1627  Weight: (!) 188 kg (!) 188 kg    Examination: General: Chronically ill-appearing, lying in bed in no acute distress HENT: Moist mucous membranes, no icterus Lungs: Diminished, distant, clear Cardiovascular: Distant, regular rate and rhythm, no murmur Abdomen: Nondistended, no tenderness Extremities: Chronic venous stasis Neuro:  Moves all extremities no focal deficits, alert and oriented   Resolved Hospital Problem list     Assessment & Plan:  Hypotension: Suspect related to acute blood loss anemia with vaginal bleeding.  Possible sepsis given recent gynecologic intervention.  Quite possible readings are also inaccurate given relative lack of symptoms despite multiple low blood pressure readings. -- Type and screen -- Reverse INR with Kcentra -- Additional 1 L fluid -- Antibiotics per primary -- CT abdomen pelvis to rule out abdominal or pelvic catastrophe, exam is reassuring  PCCM is available as needed, will monitor throughout the day and if blood pressures improved we will sign off.  Best Practice (right click and "Reselect all SmartList Selections" daily)   Per primary  Labs   CBC: Recent Labs  Lab 04/07/23 0958  WBC 8.2  HGB 8.2*  HCT 28.4*  MCV 74.9*  PLT 242    Basic Metabolic Panel: Recent Labs  Lab 04/07/23 0958  NA 137  K 4.1  CL 105  CO2 22  GLUCOSE 146*  BUN 32*  CREATININE 0.90  CALCIUM 7.6*   GFR: Estimated Creatinine Clearance: 96.8 mL/min (by C-G formula based on SCr of 0.9 mg/dL). Recent Labs  Lab 04/07/23 0958  WBC 8.2    Liver Function Tests: Recent Labs  Lab 04/07/23 0958  AST 18  ALT 12  ALKPHOS 59  BILITOT 0.6  PROT 7.6  ALBUMIN 2.4*   No results for input(s): "LIPASE", "AMYLASE" in the last 168 hours. No results for input(s): "AMMONIA" in the last 168 hours.  ABG    Component Value  Date/Time   PHART 7.415 10/12/2016 1215   PCO2ART 48.4 (H) 10/12/2016 1215   PO2ART 69.0 (L) 10/12/2016 1215   HCO3 31.0 (H) 10/12/2016 1215   TCO2 32 10/12/2016 1215   O2SAT 94.0 10/12/2016 1215     Coagulation Profile: Recent Labs  Lab 04/07/23 0958  INR 2.3*    Cardiac Enzymes: No results for input(s): "CKTOTAL", "CKMB", "CKMBINDEX", "TROPONINI" in the last 168 hours.  HbA1C: Hgb A1c MFr Bld  Date/Time Value Ref Range Status  03/09/2023 01:28 PM  6.9 (H) 4.8 - 5.6 % Final    Comment:    (NOTE) Pre diabetes:          5.7%-6.4%  Diabetes:              >6.4%  Glycemic control for   <7.0% adults with diabetes   11/07/2022 07:38 PM 7.1 (H) 4.8 - 5.6 % Final    Comment:    (NOTE)         Prediabetes: 5.7 - 6.4         Diabetes: >6.4         Glycemic control for adults with diabetes: <7.0     CBG: Recent Labs  Lab 04/04/23 0558 04/04/23 0652 04/04/23 0938 04/07/23 1758  GLUCAP 66* 155* 98 139*    Review of Systems:   No chest pain.  No abdominal pain.  Comprehensive review of systems otherwise negative.  Past Medical History:  She,  has a past medical history of Arthritis, CHF (congestive heart failure) (HCC), Diabetes mellitus without complication (HCC), Dysrhythmia, Hypertension, Morbid obesity (HCC), Multiple sclerosis (HCC), and Paroxysmal atrial fibrillation (HCC).   Surgical History:   Past Surgical History:  Procedure Laterality Date   COLONOSCOPY WITH PROPOFOL N/A 12/15/2020   Procedure: COLONOSCOPY WITH PROPOFOL;  Surgeon: Meryl Dare, MD;  Location: [facility] ENDOSCOPY;  Service: Endoscopy;  Laterality: N/A;   DILATATION & CURETTAGE/HYSTEROSCOPY WITH MYOSURE N/A 04/04/2023   Procedure: DILATATION & CURETTAGE/HYSTEROSCOPY WITH MYOSURE;  Surgeon: Carver Fila, MD;  Location: [facility] ORS;  Service: Gynecology;  Laterality: N/A;   HYSTEROSCOPY WITH D & C N/A 03/12/2023   Procedure: DIAGNOSTIC HYSTEROSCOPY, ENDOMETRIAL BIOPSY;  Surgeon: Lorriane Shire, MD;  Location: [facility] ORS;  Service: Gynecology;  Laterality: N/A;   INTRAUTERINE DEVICE (IUD) INSERTION N/A 04/04/2023   Procedure: INTRAUTERINE DEVICE (IUD) INSERTION MIRENA, TILT TEST;  Surgeon: Carver Fila, MD;  Location: [facility] ORS;  Service: Gynecology;  Laterality: N/A;     Social History:   reports that she quit smoking about 28 years ago. Her smoking use included cigarettes. She started smoking about 37 years ago. She has a 9 pack-year smoking history.  She has never used smokeless tobacco. She reports that she does not drink alcohol and does not use drugs.   Family History:  Her family history includes Diabetes in her brother, mother, sister, sister, and sister; HIV/AIDS in her brother; Heart attack in her brother; Hypertension in her mother; Kidney disease in her sister; Prostate cancer in her brother; Stomach cancer in her father. There is no history of Colon cancer, Breast cancer, Ovarian cancer, Endometrial cancer, or Pancreatic cancer.   Allergies Allergies  Allergen Reactions   Penicillins Swelling    Has patient had a PCN reaction causing immediate rash, facial/tongue/throat swelling, SOB or lightheadedness with hypotension: Yes Has patient had a PCN reaction causing severe rash involving mucus membranes or skin necrosis: No Has patient had a PCN reaction that required hospitalization No Has patient had  a PCN reaction occurring within the last 10 years: No If all of the above answers are "NO", then may proceed with Cephalosporin use.      Home Medications  Prior to Admission medications   Medication Sig Start Date End Date Taking? Authorizing Provider  traMADol (ULTRAM) 50 MG tablet Take 50 mg by mouth every 8 (eight) hours as needed. 04/04/23  Yes [provider]  acetaminophen (TYLENOL) 325 MG tablet Take 650 mg by mouth every 6 (six) hours as needed for moderate pain.    [provider]  cyanocobalamin (VITAMIN B12) 500 MCG tablet Take 1 tablet (500 mcg total) by mouth daily. 03/03/23   Marinda Elk, MD  ferrous sulfate 325 (65 FE) MG EC tablet Take 1 tablet (325 mg total) by mouth 2 (two) times daily. Patient taking differently: Take 325 mg by mouth daily with breakfast. 11/09/22 11/09/23  Danford, Earl Lites, MD  isosorbide mononitrate (IMDUR) 60 MG 24 hr tablet Take 60 mg by mouth daily. 11/10/22   [provider]  metFORMIN (GLUCOPHAGE) 1000 MG tablet Take 1,000 mg by mouth 2 (two) times daily.  02/15/23   [provider]  metoprolol succinate (TOPROL-XL) 50 MG 24 hr tablet Take 50 mg by mouth daily. Take with or immediately following a meal.    [provider]  Multiple Vitamins-Minerals (ONE A DAY WOMEN 50 PLUS PO) Take 1 tablet by mouth daily.    [provider]  torsemide (DEMADEX) 20 MG tablet Take 20 mg by mouth 2 (two) times daily. 09/29/22   [provider]  warfarin (COUMADIN) 5 MG tablet Take 1 tablet (5 mg total) by mouth See admin instructions. On Tues, and Thurs Patient taking differently: Take 2.5-5 mg by mouth See admin instructions. 2.5 mg on Thurs, all other days 5 mg 03/13/23   Marinda Elk, MD     Critical care time:    CRITICAL CARE Performed by: Karren Burly   Total critical care time: 31 minutes  Critical care time was exclusive of separately billable procedures and treating other patients.  Critical care was necessary to treat or prevent imminent or life-threatening deterioration.  Critical care was time spent personally by me on the following activities: development of treatment plan with patient and/or surrogate as well as nursing, discussions with consultants, evaluation of patient's response to treatment, examination of patient, obtaining history from patient or surrogate, ordering and performing treatments and interventions, ordering and review of laboratory studies, ordering and review of radiographic studies, pulse oximetry and re-evaluation of patient's condition.  Karren Burly, MD

## 2023-04-08 DIAGNOSIS — N939 Abnormal uterine and vaginal bleeding, unspecified: Secondary | ICD-10-CM

## 2023-04-08 DIAGNOSIS — D62 Acute posthemorrhagic anemia: Secondary | ICD-10-CM | POA: Diagnosis not present

## 2023-04-08 DIAGNOSIS — I4891 Unspecified atrial fibrillation: Secondary | ICD-10-CM | POA: Diagnosis not present

## 2023-04-08 DIAGNOSIS — C541 Malignant neoplasm of endometrium: Secondary | ICD-10-CM | POA: Diagnosis not present

## 2023-04-08 DIAGNOSIS — Z6841 Body Mass Index (BMI) 40.0 and over, adult: Secondary | ICD-10-CM

## 2023-04-08 LAB — GLUCOSE, CAPILLARY
Glucose-Capillary: 126 mg/dL — ABNORMAL HIGH (ref 70–99)
Glucose-Capillary: 125 mg/dL — ABNORMAL HIGH (ref 70–99)
Glucose-Capillary: 136 mg/dL — ABNORMAL HIGH (ref 70–99)
Glucose-Capillary: 151 mg/dL — ABNORMAL HIGH (ref 70–99)

## 2023-04-08 LAB — CBC
HCT: 24.1 % — ABNORMAL LOW (ref 36.0–46.0)
HCT: 24.4 % — ABNORMAL LOW (ref 36.0–46.0)
Hemoglobin: 7.2 g/dL — ABNORMAL LOW (ref 12.0–15.0)
Hemoglobin: 7.6 g/dL — ABNORMAL LOW (ref 12.0–15.0)
MCH: 22.9 pg — ABNORMAL LOW (ref 26.0–34.0)
MCH: 24.6 pg — ABNORMAL LOW (ref 26.0–34.0)
MCHC: 29.9 g/dL — ABNORMAL LOW (ref 30.0–36.0)
MCHC: 31.1 g/dL (ref 30.0–36.0)
MCV: 76.8 fL — ABNORMAL LOW (ref 80.0–100.0)
MCV: 79 fL — ABNORMAL LOW (ref 80.0–100.0)
Platelets: 165 10*3/uL (ref 150–400)
Platelets: 164 10*3/uL (ref 150–400)
RBC: 3.14 MIL/uL — ABNORMAL LOW (ref 3.87–5.11)
RBC: 3.09 MIL/uL — ABNORMAL LOW (ref 3.87–5.11)
RDW: 25.7 % — ABNORMAL HIGH (ref 11.5–15.5)
RDW: 24.1 % — ABNORMAL HIGH (ref 11.5–15.5)
WBC: 11.8 10*3/uL — ABNORMAL HIGH (ref 4.0–10.5)
WBC: 9.7 10*3/uL (ref 4.0–10.5)
nRBC: 0 % (ref 0.0–0.2)
nRBC: 0 % (ref 0.0–0.2)

## 2023-04-08 LAB — COMPREHENSIVE METABOLIC PANEL
ALT: 12 U/L (ref 0–44)
AST: 17 U/L (ref 15–41)
Albumin: 2.3 g/dL — ABNORMAL LOW (ref 3.5–5.0)
Alkaline Phosphatase: 55 U/L (ref 38–126)
Anion gap: 10 (ref 5–15)
BUN: 24 mg/dL — ABNORMAL HIGH (ref 8–23)
CO2: 22 mmol/L (ref 22–32)
Calcium: 7.6 mg/dL — ABNORMAL LOW (ref 8.9–10.3)
Chloride: 108 mmol/L (ref 98–111)
Creatinine, Ser: 0.67 mg/dL (ref 0.44–1.00)
GFR, Estimated: >60 mL/min (ref >60)
Glucose, Bld: 131 mg/dL — ABNORMAL HIGH (ref 70–99)
Potassium: 3.6 mmol/L (ref 3.5–5.1)
Sodium: 140 mmol/L (ref 135–145)
Total Bilirubin: 0.7 mg/dL (ref 0.3–1.2)
Total Protein: 6.9 g/dL (ref 6.5–8.1)

## 2023-04-08 LAB — HEMOGLOBIN AND HEMATOCRIT, BLOOD
HCT: 22.7 % — ABNORMAL LOW (ref 36.0–46.0)
HCT: 24.3 % — ABNORMAL LOW (ref 36.0–46.0)
HCT: 24.7 % — ABNORMAL LOW (ref 36.0–46.0)
HCT: 23.2 % — ABNORMAL LOW (ref 36.0–46.0)
Hemoglobin: 6.6 g/dL — CL (ref 12.0–15.0)
Hemoglobin: 7.3 g/dL — ABNORMAL LOW (ref 12.0–15.0)
Hemoglobin: 7.6 g/dL — ABNORMAL LOW (ref 12.0–15.0)
Hemoglobin: 7.1 g/dL — ABNORMAL LOW (ref 12.0–15.0)

## 2023-04-08 LAB — LACTIC ACID, PLASMA: Lactic Acid, Venous: 1.4 mmol/L (ref 0.5–1.9)

## 2023-04-08 LAB — PROTIME-INR
INR: 1.2 (ref 0.8–1.2)
Prothrombin Time: 15.2 s (ref 11.4–15.2)

## 2023-04-08 LAB — PREPARE RBC (CROSSMATCH)

## 2023-04-08 MED ORDER — SODIUM CHLORIDE 0.9 % IV BOLUS
250.0000 mL | Freq: Once | INTRAVENOUS | Status: AC
  Administered 2023-04-08: 250 mL via INTRAVENOUS

## 2023-04-08 MED ORDER — SODIUM CHLORIDE 0.9 % IV BOLUS: 2000.0000 mL | Freq: Once | INTRAVENOUS | Status: DC

## 2023-04-08 MED ORDER — PNEUMOCOCCAL 20-VAL CONJ VACC 0.5 ML IM SUSY: 0.5000 mL | PREFILLED_SYRINGE | INTRAMUSCULAR | Status: DC | PRN

## 2023-04-08 MED ORDER — ENSURE ENLIVE PO LIQD
237.0000 mL | Freq: Two times a day (BID) | ORAL | Status: DC
  Administered 2023-04-11: 237 mL via ORAL

## 2023-04-08 MED ORDER — SODIUM CHLORIDE 0.9% IV SOLUTION: Freq: Once | INTRAVENOUS | Status: DC

## 2023-04-08 NOTE — Plan of Care (Signed)

## 2023-04-08 NOTE — Progress Notes (Addendum)
PROGRESS NOTE    Zoe Cobb  GNF:[id number]  DOB: 1956-05-07  DOA: 04/07/2023 PCP: Marletta Lor, NP Outpatient Specialists:   Hospital course:  67 year old woman with A-fib on Coumadin and grade 1 endometrial adenocarcinoma and recent hysteroscopy with endometrial sampling and IUD placement was admitted significant vaginal bleeding.  Patient has had multiple admissions for vaginal bleeding in the past and is sometimes supratherapeutic on her INR.  Patient was treated with Kcentra, seen by critical care and admitted to [facility] for fluid/blood resuscitation and support.  Patient was seen by Dr. Pricilla Holm who is her outpatient GYN oncologist to follows her closely.   Subjective:  Patient states she is feeling okay.  Denies any shortness of breath or chest pain.  Notes that she did have a little bit of bleeding this morning when they changed her but does not believe that she has passed any clots in the past 12 to 16 hours  Objective: Vitals:   04/08/23 1134 04/08/23 1200 04/08/23 1300 04/08/23 1320  BP: 101/80 (!) 97/57 102/72 93/66  Pulse: (!) 122 (!) 125 (!) 117 (!) 122  Resp: 17 (!) 21 (!) 21 20  Temp: 98.3 F (36.8 C)   98 F (36.7 C)  TempSrc: Oral   Oral  SpO2: 100% 98% 100% 94%  Weight:      Height:        Intake/Output Summary (Last 24 hours) at 04/08/2023 1354 Last data filed at 04/08/2023 1320 Gross per 24 hour  Intake 2899 ml  Output 1125 ml  Net 1774 ml   Filed Weights   04/07/23 0923 04/07/23 1627  Weight: (!) 188 kg (!) 188 kg     Exam:  General: Friendly morbidly obese patient lying in bed in no distress. Eyes: sclera anicteric, conjuctiva mild injection bilaterally CVS: Distant heart sounds, tacky Respiratory:  decreased air entry bilaterally secondary to body habitus GI: Obese, soft, nontender LE: Warm and well-perfused Neuro: A/O x 3,  grossly nonfocal.  Psych: patient is logical and coherent, judgement and insight appear normal, mood and affect  appropriate to situation.  Data Reviewed:  Basic Metabolic Panel: Recent Labs  Lab 04/07/23 0958 04/08/23 0338  NA 137 140  K 4.1 3.6  CL 105 108  CO2 22 22  GLUCOSE 146* 131*  BUN 32* 24*  CREATININE 0.90 0.67  CALCIUM 7.6* 7.6*    CBC: Recent Labs  Lab 04/07/23 0958 04/07/23 2240 04/08/23 0338 04/08/23 1036  WBC 8.2  --  11.8*  --   HGB 8.2* 8.0* 7.2* 6.6*  HCT 28.4* 27.2* 24.1* 22.7*  MCV 74.9*  --  76.8*  --   PLT 242  --  165  --      Scheduled Meds:  sodium chloride   Intravenous Once   Chlorhexidine Gluconate Cloth  6 each Topical Daily   insulin aspart  0-15 Units Subcutaneous TID WC   insulin aspart  0-5 Units Subcutaneous QHS   Continuous Infusions:  sodium chloride Stopped (04/08/23 1119)     Assessment & Plan:   Vaginal bleeding with ABLA Anticoagulation on Coumadin for atrial fibrillation Patient's hemoglobin was 9 earlier this month, was 8.2 when she presented to the ED and is 6.6 this morning despite receiving 1 unit of blood overnight. Her blood pressures also are now in the 90s over 60s, patient is asymptomatic She is getting transfused 2 units PRBC, will recheck CBC in between units and after the second unit continue aggressive repletion as necessary.  Follow  H&H every 6 hours. Low threshold for contacting PCCM if patient does not respond well to blood transfusions as is expected or if she develops further vaginal bleeding. Plan is to contact Dr. Pricilla Holm or GYN oncology if she is unavailable if vaginal bleeding restarts Antibiotics discontinued as Dr. Pricilla Holm feels the likelihood of endometritis is very low  Atrial fibrillation Patient is tachycardic due to hypotension and anemia She is off of her usual rate control/antihypertensive medications Coumadin has been reversed with Kcentra as well Patient is followed by Dr. Jacinto Halim for anticoagulation, unclear if there are better alternatives for her Of note her BMI is 60 which may limit her  choices Dr. Pricilla Holm was hoping either cardiology or hematology would provide input on alternatives to anticoagulation One possibility possibility is to consider Lovenox at discharge  DM2 SSI initiated earlier today Continue present management for now  Super morbid obesity BMI 78, this complicates all aspects of care     DVT prophylaxis: SCD Code Status: Full Family Communication: None today      Studies: CT ABDOMEN PELVIS W CONTRAST  Result Date: 04/07/2023 CLINICAL DATA:  Abdominal pain, vaginal bleeding after recent dilatation and curettage procedure EXAM: CT ABDOMEN AND PELVIS WITH CONTRAST TECHNIQUE: Multidetector CT imaging of the abdomen and pelvis was performed using the standard protocol following bolus administration of intravenous contrast. RADIATION DOSE REDUCTION: This exam was performed according to the departmental dose-optimization program which includes automated exposure control, adjustment of the mA and/or kV according to patient size and/or use of iterative reconstruction technique. CONTRAST:  OMNIPAQUE IOHEXOL 300 MG/ML  SOLN COMPARISON:  12/30/2022, 03/01/2023 FINDINGS: Evaluation is limited by patient body habitus. Lower chest: No acute pleural or parenchymal lung disease. Hepatobiliary: Multiple calcified gallstones are identified without evidence of acute cholecystitis. The liver is unremarkable. No biliary duct dilation. Pancreas: Unremarkable. No pancreatic ductal dilatation or surrounding inflammatory changes. Spleen: Normal in size without focal abnormality. Adrenals/Urinary Tract: Stable bilateral renal hypodensities, likely cysts. No specific follow-up is recommended. No urinary tract calculi or obstruction. The adrenals and bladder are unremarkable. Stomach/Bowel: No bowel obstruction or ileus. Normal appendix lower central abdomen. Diverticulosis of the distal colon without diverticulitis. Moderate retained stool within the rectal vault.  Vascular/Lymphatic: Aortic atherosclerosis. No enlarged abdominal or pelvic lymph nodes. Reproductive: There has been interval placement of an IUD. At the level of the cervix there is an 8.0 x 8.3 x 7.2 cm hyperdense masslike area compatible hematoma given recent invasive uterine procedure. There are no adnexal masses. Other: No free fluid or free intraperitoneal gas. No abdominal wall hernia. Musculoskeletal: No acute or destructive bony abnormalities. Stable multilevel thoracolumbar spondylosis and facet hypertrophy, most pronounced at T11-12 and L5-S1. 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 Reconstructed images demonstrate no additional findings. IMPRESSION: 1. 8.3 cm hyperdense masslike area at the level of cervix, consistent with postprocedural hematoma given recent D and C with IUD placement. 2. Cholelithiasis without acute cholecystitis. 3.  Aortic Atherosclerosis (ICD10-I70.0). Electronically Signed   By: Sharlet Salina M.D.   On: 04/07/2023 16:04    Principal Problem:   Acute blood loss anemia Active Problems:   Morbid obesity with BMI of 70 and over, adult [facility])   Vaginal bleeding     Mikelle Myrick Orma Flaming, [facility]  If 7PM-7AM, please contact night-coverage [email or web address]   LOS: 1 day

## 2023-04-08 NOTE — Consult Note (Addendum)
GYNECOLOGIC ONCOLOGY INPATIENT CONSULTATION  Date of Service: 04/08/23 Requesting Service: [facility] Requesting Provider: Dr. Teena Dunk Consulting Provider: Eugene Garnet, MD   HISTORY OF PRESENT ILLNESS: Zoe Cobb is a 67 y.o. woman who is seen in consultation at the request of Dr. Kirby Crigler for evaluation of vaginal bleeding after recent hysteroscopy, endometrial sampling and Mirena IUD placement. 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  The patient held her Coumadin prior to her recent procedure, restarted it on 8/29.  She endorses beginning to have vaginal bleeding and pelvic pain on 8/30 with passage of large blood clots.  This is large-volume vaginal bleeding.  On presentation to the emergency department, mildly hypotensive (blood pressures normally run low), heart rate in the 90s.  Hemoglobin 8.3 with an INR of 2.3.  Patient was admitted to the stepdown unit and transfused 1 unit of packed red blood cells.  She was also given Kcentra to reverse her INR.  The setting of hypotension, CT scan was obtained to rule out source of infection.  This was notable for a blood clot in the cervix versus upper vagina.  She was started on Rocephin and doxycycline for possible endometritis.  This morning, the patient reports doing well.  Dizziness has resolved.  Both she and RN note no bleeding overnight.  Abdominal/pelvic pain is improving.  Denies any shortness of breath or chest pain.  Treatment History: The patient was admitted in 10/2021 for anemia in the setting of vaginal bleeding with plan for outpatient follow-up within a week, which appears did not happen.  Patient was started on 40 mg of Megace twice daily for a total of 7 days.   In January, the patient was seen as a new patient for menopausal bleeding for more than a year.  This was described as intermittent without associated pain  or cramping.  Plan at that time was to proceed with EUA and D&C with ultrasound to assess endometrial lining.  Also discussed suppression with progesterone containing IUD.   The patient has had multiple admissions since for vaginal and/or rectal bleeding.  She was admitted in February for bleeding in the setting of an INR elevated at 3.5.  The patient was admitted in April with vaginal and rectal bleeding found to have a supratherapeutic INR in the setting of being on Coumadin for A-fib.  She received vitamin K.  She was treated for diverticulitis at that time.  She was briefly admitted again in July after she presented with recurrent vaginal bleeding with an episode lasting for 3 days.  Her hemoglobin was noted to be 7.7.  This is in the setting of being on Coumadin for A-fib with an INR of 3.6.  Her Coumadin was held while he received vitamin K until INR decreased.  She was also given 2 units of packed red blood cells.   Pelvic ultrasound exam was performed on 03/01/2023 and this revealed a uterus measuring 5.7 x 7 x 13.4 cm with a 4 x 5 x 5.2 lesion measured in the uterus corresponding to a fundal leiomyoma better seen on prior CT scan.  Endometrium with markedly limited evaluation, measures up to 12 mm.  Neither ovary seen.   The patient was taken for hysteroscopy with endometrial sampling on 03/12/2023.  Findings at surgery included normal-appearing cervix with abundant friable tissue within a dilated cervical os.  Hysteroscopy was abandoned secondary to abundant tissue limiting view.  Pathology revealed endometrioid  carcinoma with extensive squamous morular formation, FIGO grade 1.  PAST MEDICAL HISTORY: Past Medical History:  Diagnosis Date   Arthritis    CHF (congestive heart failure) (HCC)    Diabetes mellitus without complication (HCC)    metformin   Dysrhythmia    A-fib   Hypertension    Morbid obesity (HCC)    Multiple sclerosis (HCC)    asymptomatic   Paroxysmal atrial fibrillation  (HCC)     PAST SURGICAL HISTORY: Past Surgical History:  Procedure Laterality Date   COLONOSCOPY WITH PROPOFOL N/A 12/15/2020   Procedure: COLONOSCOPY WITH PROPOFOL;  Surgeon: Meryl Dare, MD;  Location: [facility] ENDOSCOPY;  Service: Endoscopy;  Laterality: N/A;   DILATATION & CURETTAGE/HYSTEROSCOPY WITH MYOSURE N/A 04/04/2023   Procedure: DILATATION & CURETTAGE/HYSTEROSCOPY WITH MYOSURE;  Surgeon: Carver Fila, MD;  Location: [facility] ORS;  Service: Gynecology;  Laterality: N/A;   HYSTEROSCOPY WITH D & C N/A 03/12/2023   Procedure: DIAGNOSTIC HYSTEROSCOPY, ENDOMETRIAL BIOPSY;  Surgeon: Lorriane Shire, MD;  Location: [facility] ORS;  Service: Gynecology;  Laterality: N/A;   INTRAUTERINE DEVICE (IUD) INSERTION N/A 04/04/2023   Procedure: INTRAUTERINE DEVICE (IUD) INSERTION MIRENA, TILT TEST;  Surgeon: Carver Fila, MD;  Location: [facility] ORS;  Service: Gynecology;  Laterality: N/A;    OB/GYN HISTORY: OB History  Gravida Para Term Preterm AB Living  2 2          SAB IAB Ectopic Multiple Live Births               # Outcome Date GA Lbr Len/2nd Weight Sex Type Anes PTL Lv  2 Para           1 Para            MEDICATIONS:  Current Facility-Administered Medications:    0.9 %  sodium chloride infusion, , Intravenous, Continuous, Kirby Crigler, Mir M, MD, Last Rate: 100 mL/hr at 04/08/23 0600, Infusion Verify at 04/08/23 0600   acetaminophen (TYLENOL) tablet 650 mg, 650 mg, Oral, Q6H PRN **OR** acetaminophen (TYLENOL) suppository 650 mg, 650 mg, Rectal, Q6H PRN, Kirby Crigler, Mir M, MD   albuterol (PROVENTIL) (2.5 MG/3ML) 0.083% nebulizer solution 2.5 mg, 2.5 mg, Nebulization, Q2H PRN, Kirby Crigler, Mir M, MD   cefTRIAXone (ROCEPHIN) 2 g in sodium chloride 0.9 % 100 mL IVPB, 2 g, Intravenous, Q24H, Kirby Crigler, Mir M, MD, Last Rate: 200 mL/hr at 04/08/23 0600, Infusion Verify at 04/08/23 0600   Chlorhexidine Gluconate Cloth 2 % PADS 6 each, 6 each, Topical, Daily, Kirby Crigler, Mir M, MD, 6 each at 04/07/23  1548   doxycycline (VIBRAMYCIN) 100 mg in sodium chloride 0.9 % 250 mL IVPB, 100 mg, Intravenous, Q12H, Kirby Crigler, Mir M, MD, Stopped at 04/08/23 0513   guaiFENesin-dextromethorphan (ROBITUSSIN DM) 100-10 MG/5ML syrup 10 mL, 10 mL, Oral, Q4H PRN, Kirby Crigler, Mir M, MD, 10 mL at 04/08/23 4098   HYDROmorphone (DILAUDID) injection 0.5-1 mg, 0.5-1 mg, Intravenous, Q2H PRN, Kirby Crigler, Mir M, MD   insulin aspart (novoLOG) injection 0-15 Units, 0-15 Units, Subcutaneous, TID WC, Kirby Crigler, Mir M, MD   insulin aspart (novoLOG) injection 0-5 Units, 0-5 Units, Subcutaneous, QHS, Ikramullah, Mir M, MD   ondansetron (ZOFRAN) tablet 4 mg, 4 mg, Oral, Q6H PRN **OR** ondansetron (ZOFRAN) injection 4 mg, 4 mg, Intravenous, Q6H PRN, Kirby Crigler, Mir M, MD   Oral care mouth rinse, 15 mL, Mouth Rinse, PRN, Kirby Crigler, Mir M, MD   traZODone (DESYREL) tablet 25 mg, 25 mg, Oral, QHS PRN, Kirby Crigler, Mir M, MD  ALLERGIES: Allergies  Allergen  Reactions   Penicillins Swelling    Has patient had a PCN reaction causing immediate rash, facial/tongue/throat swelling, SOB or lightheadedness with hypotension: Yes Has patient had a PCN reaction causing severe rash involving mucus membranes or skin necrosis: No Has patient had a PCN reaction that required hospitalization No Has patient had a PCN reaction occurring within the last 10 years: No If all of the above answers are "NO", then may proceed with Cephalosporin use.     FAMILY HISTORY: Family History  Problem Relation Age of Onset   Hypertension Mother    Diabetes Mother    Stomach cancer Father    Kidney disease Sister    Diabetes Sister    Diabetes Sister    Diabetes Sister    Heart attack Brother    HIV/AIDS Brother    Diabetes Brother    Prostate cancer Brother    Colon cancer Neg Hx    Breast cancer Neg Hx    Ovarian cancer Neg Hx    Endometrial cancer Neg Hx    Pancreatic cancer Neg Hx     SOCIAL HISTORY: Social History   Socioeconomic  History   Marital status: Married    Spouse name: Not on file   Number of children: 2   Years of education: Not on file   Highest education level: Not on file  Occupational History   Not on file  Tobacco Use   Smoking status: Former    Current packs/day: 0.00    Average packs/day: 1 pack/day for 9.0 years (9.0 ttl pk-yrs)    Types: Cigarettes    Start date: 59    Quit date: 1996    Years since quitting: 28.6   Smokeless tobacco: Never  Vaping Use   Vaping status: Never Used  Substance and Sexual Activity   Alcohol use: No    Alcohol/week: 0.0 standard drinks of alcohol   Drug use: No   Sexual activity: Not on file  Other Topics Concern   Not on file  Social History Narrative   Pt lives by herself, she completed hs.    Social Determinants of Health   Financial Resource Strain: Not on file  Food Insecurity: No Food Insecurity (03/03/2023)   Hunger Vital Sign    Worried About Running Out of Food in the Last Year: Never true    Ran Out of Food in the Last Year: Never true  Transportation Needs: No Transportation Needs (03/03/2023)   PRAPARE - Administrator, Civil Service (Medical): No    Lack of Transportation (Non-Medical): No  Physical Activity: Not on file  Stress: Not on file  Social Connections: Not on file  Intimate Partner Violence: Not At Risk (03/03/2023)   Humiliation, Afraid, Rape, and Kick questionnaire    Fear of Current or Ex-Partner: No    Emotionally Abused: No    Physically Abused: No    Sexually Abused: No    PHYSICAL EXAM: BP (!) 103/57   Pulse 87   Temp 98.2 F (36.8 C) (Oral)   Resp 17   Ht 5\' 1"  (1.549 m)   Wt (!) 414 lb 7.4 oz (188 kg) Comment: verbalized per report; unable to obtain from bed  SpO2 99%   BMI 78.31 kg/m  General: Alert, oriented, no acute distress. HEENT: Normocephalic, atraumatic.  Sclera anicteric, posterior oropharynx clear.   Chest: Clear to auscultation bilaterally. Cardiovascular: Regular rate and  rhythm, no murmurs, rubs, or gallops. Abdomen: Obese.  Normoactive bowel sounds.  Soft, nondistended, nontender to palpation.   Extremities: Trace lower extremity edema bilaterally.  No calf tenderness to palpation.  Warm and well-perfused. GU: Foley catheter in place.  Small amount of blood on Chux underneath the patient, no evidence of active bleeding.  LABORATORY AND RADIOLOGIC DATA:    Latest Ref Rng & Units 04/08/2023    3:38 AM 04/07/2023   10:40 PM 04/07/2023    9:58 AM  CBC  WBC 4.0 - 10.5 K/uL 11.8   8.2   Hemoglobin 12.0 - 15.0 g/dL 7.2  8.0  8.2   Hematocrit 36.0 - 46.0 % 24.1  27.2  28.4   Platelets 150 - 400 K/uL 165   242       Latest Ref Rng & Units 04/08/2023    3:38 AM 04/07/2023    9:58 AM 03/09/2023    1:28 PM  BMP  Glucose 70 - 99 mg/dL 409  811  97   BUN 8 - 23 mg/dL 24  32  16   Creatinine 0.44 - 1.00 mg/dL 9.14  7.82  9.56   Sodium 135 - 145 mmol/L 140  137  134   Potassium 3.5 - 5.1 mmol/L 3.6  4.1  3.9   Chloride 98 - 111 mmol/L 108  105  102   CO2 22 - 32 mmol/L 22  22  22    Calcium 8.9 - 10.3 mg/dL 7.6  7.6  8.3    CT A/P on 8/31: 1. 8.3 cm hyperdense masslike area at the level of cervix, consistent with postprocedural hematoma given recent D and C with IUD placement. 2. Cholelithiasis without acute cholecystitis. 3.  Aortic Atherosclerosis (ICD10-I70.0).  ASSESSMENT AND PLAN: Zoe Cobb is a 67 y.o. 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  Acute on chronic anemia: Related to postprocedure heavy vaginal bleeding in the setting of endometrial sampling and restarting Coumadin.  Patient received reversal yesterday with minimal bleeding overnight and this morning.  Hemoglobin down trended to 7.2 despite 1 unit of blood yesterday.  I suspect that we are just seeing the  nadir from her initial bleeding prior to presenting to the emergency department and during the day yesterday.  Dizziness has resolved.  Blood pressure in the 90s over 50s while I was in the room, similar to her baseline.  Heart rate in the 80s.   I would recommend transfusion with 1-2 units of packed red blood cells.  Continue monitoring bleeding.  Agree with continued Foley catheter for accurate I&O as well as to help determine true bleeding versus overestimation due to urine mixing with blood.   I spent some time discussing with the patient options if she were to start having increased bleeding again.  For the reasons I had previously discussed with her, she is not a candidate for definitive surgical intervention.  One option would be to add some oral progesterone, such as Provera or Megace.  Oral progesterone cannot mildly increase the risk of VTE which in the setting of her immobility and cancer diagnosis would be preferable to avoid.  Also discussed the possibility of uterine artery embolization.  While this is typically something we would try to avoid in the setting of a patient who may need future radiation treatment, goal would be to continue treatment of her endometrial cancer with progesterone for at least 6-9 months and if failure to  progress, could consider radiation at that time.  Given anticipated time lapse before we would consider radiation therapy, I think uterine artery embolization could be a useful tool for refractory vaginal bleeding that would not compromise further cancer directed treatment.  At this time, given significant decrease in vaginal bleeding, I would not recommend this intervention unless bleeding significantly increased.  A-fib and anticoagulation: The patient has had multiple admissions and emergency department visits for bleeding in the setting of supratherapeutic INR.  It may be worth a discussion about whether there is another option for her anticoagulation now in the  setting of endometrial cancer which puts her at risk of future episodes of bleeding. I would recommend continuing to hold her Coumadin at the time of discharge.  If bleeding remains normal for 5-7 days after discharge, we will reassess restarting blood thinner.  Recommendations: -Transfuse 1-2 units packed red blood cells -Recommend stopping antibiotics.  Normal WBC, afebrile.  I think low likelihood that she has endometritis. - Continue to monitor vaginal bleeding.  Continue Foley catheter for accurate I&O and assessment of vaginal bleeding. -Recommend discussion with cardiologist and/or hematology regarding any other option for anticoagulation given multiple admissions with supratherapeutic INR -I would recommend holding anticoagulation at discharge if bleeding remains minimal.  This can be restarted in the outpatient setting -If bleeding increases, please contact me.  We can consider adding some oral progesterone or, if heavy bleeding, can discuss with interventional radiology possibility of uterine artery embolization  The above assessment and plan was communicated to the patient's primary team.  Eugene Garnet MD Gynecologic Oncology

## 2023-04-09 DIAGNOSIS — D62 Acute posthemorrhagic anemia: Secondary | ICD-10-CM

## 2023-04-09 LAB — BASIC METABOLIC PANEL
Anion gap: 5 (ref 5–15)
BUN: 17 mg/dL (ref 8–23)
CO2: 23 mmol/L (ref 22–32)
Calcium: 7.1 mg/dL — ABNORMAL LOW (ref 8.9–10.3)
Chloride: 108 mmol/L (ref 98–111)
Creatinine, Ser: 0.59 mg/dL (ref 0.44–1.00)
GFR, Estimated: >60 mL/min (ref >60)
Glucose, Bld: 135 mg/dL — ABNORMAL HIGH (ref 70–99)
Potassium: 3.1 mmol/L — ABNORMAL LOW (ref 3.5–5.1)
Sodium: 136 mmol/L (ref 135–145)

## 2023-04-09 LAB — HEMOGLOBIN AND HEMATOCRIT, BLOOD
HCT: 23.3 % — ABNORMAL LOW (ref 36.0–46.0)
HCT: 27.3 % — ABNORMAL LOW (ref 36.0–46.0)
HCT: 24 % — ABNORMAL LOW (ref 36.0–46.0)
HCT: 28.1 % — ABNORMAL LOW (ref 36.0–46.0)
HCT: 25.5 % — ABNORMAL LOW (ref 36.0–46.0)
Hemoglobin: 7 g/dL — ABNORMAL LOW (ref 12.0–15.0)
Hemoglobin: 8.3 g/dL — ABNORMAL LOW (ref 12.0–15.0)
Hemoglobin: 7.5 g/dL — ABNORMAL LOW (ref 12.0–15.0)
Hemoglobin: 8.5 g/dL — ABNORMAL LOW (ref 12.0–15.0)
Hemoglobin: 7.8 g/dL — ABNORMAL LOW (ref 12.0–15.0)

## 2023-04-09 LAB — GLUCOSE, CAPILLARY
Glucose-Capillary: 137 mg/dL — ABNORMAL HIGH (ref 70–99)
Glucose-Capillary: 119 mg/dL — ABNORMAL HIGH (ref 70–99)
Glucose-Capillary: 131 mg/dL — ABNORMAL HIGH (ref 70–99)
Glucose-Capillary: 168 mg/dL — ABNORMAL HIGH (ref 70–99)

## 2023-04-09 LAB — PROTIME-INR
INR: 1.2 (ref 0.8–1.2)
Prothrombin Time: 15.3 s — ABNORMAL HIGH (ref 11.4–15.2)

## 2023-04-09 LAB — PREPARE RBC (CROSSMATCH)

## 2023-04-09 MED ORDER — SODIUM CHLORIDE 0.9 % IV SOLN
250.0000 mg | Freq: Once | INTRAVENOUS | Status: AC
  Administered 2023-04-09: 250 mg via INTRAVENOUS
  Filled 2023-04-09: qty 20

## 2023-04-09 MED ORDER — SODIUM CHLORIDE 0.9% IV SOLUTION: Freq: Once | INTRAVENOUS | Status: AC

## 2023-04-09 MED ORDER — POTASSIUM CHLORIDE CRYS ER 20 MEQ PO TBCR
40.0000 meq | EXTENDED_RELEASE_TABLET | Freq: Once | ORAL | Status: AC
  Administered 2023-04-09: 40 meq via ORAL
  Filled 2023-04-09: qty 2

## 2023-04-09 MED ORDER — METOPROLOL TARTRATE 25 MG PO TABS
25.0000 mg | ORAL_TABLET | Freq: Two times a day (BID) | ORAL | Status: DC
  Administered 2023-04-09 – 2023-04-11 (×4): 25 mg via ORAL
  Filled 2023-04-09 (×5): qty 1

## 2023-04-09 NOTE — Progress Notes (Signed)
PROGRESS NOTE  Zoe Cobb  DOB: [birth year]  PCP: Marletta Lor, NP WUJ:[id number]  DOA: 04/07/2023  LOS: 2 days  Hospital Day: 3  Brief narrative: Zoe Cobb is a 67 y.o. female with PMH significant for morbid obesity, DM2, HTN, CHF, A-fib on Coumadin, arthritis. Patient had multiple admissions since 2023 March for acute anemia requiring blood transfusions.  Pelvic ultrasound in July 2024 showed of 14 cm mass in the uterus.  Seen by GYN as an outpatient.  8/5 underwent hysteroscopy with endometrial sampling and IUD placement.  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  8/28, CT underwent repeat sampling which confirmed previous pathology finding.  She is chronically on Coumadin and was held for the procedure and resumed on 8/29.  Soon after that, patient started to have heavy vaginal bleeding with clots. 8/31, patient presented to the ED for evaluation of heavy bleeding and abdominal pain  In the ED, she had mild hypotension Labs with hemoglobin 8.3 and INR 2.3.  Given a unit of PRBC.  Also given 1 dose of Kcentra to reverse INR CT scan showed blood clot in the cervix Started on IV antibiotics for possible endometritis Admitted to [facility] GYN oncology was consulted  Subjective: Patient was seen and examined this morning.  Elderly African-American female.  Morbidly obese.  Lying on bed.  Not in distress. States he is still bleeding. Chart reviewed In the last 24 hours, patient has had blood pressure dropped to 80s Most recent labs from this morning with hemoglobin low at 7, INR 1.2, potassium 3.1  Assessment and plan: Persistent vaginal bleeding  Endometrial carcinoma Patient presented with heavy vaginal bleeding after endometrial sampling that confirmed endometrioid carcinoma Last seen by GYN oncology yesterday. If bleeding continues or worsens, noted a tentative plan to start oral progesterone versus uterine artery embolization  Acute  on chronic blood loss anemia Chronic anticoagulation Chronic iron deficiency Chronic vitamin B12 deficiency Hemoglobin at presentation was over 7.2.  Received a total of 3 units of PRBC so far.  Hemoglobin this morning is further low at 7.  1 more unit of PRBC transfusion ordered. Last anemia panel from July 2024 showed low ferritin level. I would order 1 dose of IV iron today Continue oral iron and vitamin B12 supplement Anticoagulant hold.  Continue to monitor. Recent Labs    09/09/22 1603 09/09/22 1700 09/09/22 2204 03/02/23 0813 03/02/23 1816 04/08/23 0338 04/08/23 1036 04/08/23 1821 04/08/23 1936 04/08/23 2323 04/09/23 0253 04/09/23 1250 04/09/23 1426  HGB 8.4*  --    < >  --    < > 7.2*   < > 7.6* 7.6* 7.1* 7.0* 8.3* 7.5*  MCV  --   --    < >  --    < > 76.8*  --  79.0*  --   --   --   --   --   VITAMINB12  --   --   --  228  --   --   --   --   --   --   --   --   --   FOLATE  --   --   --  23.2  --   --   --   --   --   --   --   --   --   FERRITIN 14  --   --  16  --   --   --   --   --   --   --   --   --  TIBC 354  --   --  252  --   --   --   --   --   --   --   --   --   IRON 25*  --   --  18*  --   --   --   --   --   --   --   --   --   RETICCTPCT  --  1.1  --  1.2  --   --   --   --   --   --   --   --   --    < > = values in this interval not displayed.   Hypotension  H/o HTN, chronic diastolic CHF Blood pressure running low in 80s due to bleeding PTA meds-Toprol 50 mg daily, Imdur 60 mg daily, torsemide 20 mg twice daily Because of hypotension, currently all meds on hold.  Blood pressure improving.  I will resume metoprolol today.   A fib with RVR In the ED, patient's heart rhythm flipped to A-fib with RVR in the setting of persistent bleeding and hypotension.   PTA on metoprolol.  Resume today.   Chronically anticoagulated with Coumadin.  On hold for now because of bleeding. In the setting of bleeding, patient was also given Kcentra.  INR now in normal  range. Recent Labs  Lab 04/07/23 0958 04/07/23 2240 04/08/23 0338 04/09/23 0253  INR 2.3* 1.2 1.2 1.2   Hypokalemia Potassium low at 3.1.  Replacement given.  Continue to monitor Recent Labs  Lab 04/07/23 0958 04/08/23 0338 04/09/23 0253  K 4.1 3.6 3.1*   Type 2 diabetes mellitus A1c 6.9 on 03/09/2023, could be falsely low because of chronic anemia PTA meds-metformin 1000 mg twice daily Continue SSI/Accu-Cheks Recent Labs  Lab 04/08/23 1225 04/08/23 1647 04/08/23 2113 04/09/23 0829 04/09/23 1207  GLUCAP 125* 136* 151* 137* 119*   Morbid Obesity  Body mass index is 78.31 kg/m. Patient has been advised to make an attempt to improve diet and exercise patterns to aid in weight loss.  Mobility: Wheelchair-bound since 1996.  Uses [employer] lift at home  Goals of care   Code Status: Full Code     DVT prophylaxis:  SCDs Start: 04/07/23 1333   Antimicrobials: None currently Fluid: Currently on normal saline at 100 mL/h.  Blood pressure rising up.  I will stop it at this time Consultants: GYN oncology Family Communication: None at bedside  Status: Inpatient Level of care:  Stepdown   Patient is from: Home Needs to continue in-hospital care: Continues to have vaginal bleeding and requiring blood transfusion Anticipated d/c to: Pending clinical course      Diet:  Diet Order             Diet heart healthy/carb modified Room service appropriate? Yes; Fluid consistency: Thin  Diet effective now                   Scheduled Meds:  sodium chloride   Intravenous Once   Chlorhexidine Gluconate Cloth  6 each Topical Daily   feeding supplement  237 mL Oral BID BM   insulin aspart  0-15 Units Subcutaneous TID WC   insulin aspart  0-5 Units Subcutaneous QHS   metoprolol tartrate  25 mg Oral BID    PRN meds: acetaminophen **OR** acetaminophen, albuterol, guaiFENesin-dextromethorphan, HYDROmorphone (DILAUDID) injection, ondansetron **OR** ondansetron (ZOFRAN) IV,  mouth rinse, pneumococcal 20-valent conjugate vaccine, traZODone   Infusions:  Antimicrobials: Anti-infectives (From admission, onward)    Start     Dose/Rate Route Frequency Ordered Stop   04/07/23 1430  cefTRIAXone (ROCEPHIN) 2 g in sodium chloride 0.9 % 100 mL IVPB  Status:  Discontinued        2 g 200 mL/hr over 30 Minutes Intravenous Every 24 hours 04/07/23 1400 04/08/23 0908   04/07/23 1430  doxycycline (VIBRAMYCIN) 100 mg in sodium chloride 0.9 % 250 mL IVPB  Status:  Discontinued        100 mg 125 mL/hr over 120 Minutes Intravenous Every 12 hours 04/07/23 1400 04/08/23 0908       Nutritional status:  Body mass index is 78.31 kg/m.          Objective: Vitals:   04/09/23 1400 04/09/23 1500  BP: (!) 147/89 (!) 155/94  Pulse: 79 78  Resp: 17 (!) 23  Temp:    SpO2: 97% 97%    Intake/Output Summary (Last 24 hours) at 04/09/2023 1610 Last data filed at 04/09/2023 1400 Gross per 24 hour  Intake 3931.24 ml  Output 1775 ml  Net 2156.24 ml   Filed Weights   04/07/23 0923 04/07/23 1627  Weight: (!) 188 kg (!) 188 kg   Weight change:  Body mass index is 78.31 kg/m.   Physical Exam: General exam: Elderly morbidly obese African-American female.  Not in pain Skin: No rashes, lesions or ulcers. HEENT: Atraumatic, normocephalic, no obvious bleeding Lungs: Clear to auscultation bilaterally CVS: Regular rate and rhythm, no murmur GI/Abd: soft, nontender, distended from obesity, bowel sound present CNS: Alert, awake, oriented x 3 Psychiatry: Mood appropriate Extremities: Chronic bilateral lower extremity lymphedema  Data Review: I have personally reviewed the laboratory data and studies available.  F/u labs ordered Unresulted Labs (From admission, onward)     Start     Ordered   04/10/23 0500  Basic metabolic panel  Daily,   R     Question:  Specimen collection method  Answer:  Lab=Lab collect   04/09/23 0854   04/10/23 0500  CBC with Differential/Platelet   Daily,   R     Question:  Specimen collection method  Answer:  Lab=Lab collect   04/09/23 0854   04/10/23 0500  Phosphorus  Tomorrow morning,   R       Question:  Specimen collection method  Answer:  Lab=Lab collect   04/09/23 0854   04/10/23 0500  Magnesium  Tomorrow morning,   R       Question:  Specimen collection method  Answer:  Lab=Lab collect   04/09/23 0854   04/08/23 1420  Hemoglobin and hematocrit, blood  Now then every 6 hours,   R (with TIMED occurrences)     Question:  Specimen collection method  Answer:  Lab=Lab collect   04/08/23 1419            Total time spent in review of labs and imaging, patient evaluation, formulation of plan, documentation and communication with family: 55 minutes  Signed, Lorin Glass, MD [facility] 04/09/2023

## 2023-04-10 DIAGNOSIS — D62 Acute posthemorrhagic anemia: Secondary | ICD-10-CM | POA: Diagnosis not present

## 2023-04-10 LAB — CBC WITH DIFFERENTIAL/PLATELET
Abs Immature Granulocytes: 0.11 10*3/uL — ABNORMAL HIGH (ref 0.00–0.07)
Basophils Absolute: 0 10*3/uL (ref 0.0–0.1)
Basophils Relative: 1 %
Eosinophils Absolute: 0.2 10*3/uL (ref 0.0–0.5)
Eosinophils Relative: 3 %
HCT: 25.1 % — ABNORMAL LOW (ref 36.0–46.0)
Hemoglobin: 7.6 g/dL — ABNORMAL LOW (ref 12.0–15.0)
Immature Granulocytes: 2 %
Lymphocytes Relative: 20 %
Lymphs Abs: 1.4 10*3/uL (ref 0.7–4.0)
MCH: 24.9 pg — ABNORMAL LOW (ref 26.0–34.0)
MCHC: 30.3 g/dL (ref 30.0–36.0)
MCV: 82.3 fL (ref 80.0–100.0)
Monocytes Absolute: 0.6 10*3/uL (ref 0.1–1.0)
Monocytes Relative: 9 %
Neutro Abs: 4.8 10*3/uL (ref 1.7–7.7)
Neutrophils Relative %: 65 %
Platelets: 180 10*3/uL (ref 150–400)
RBC: 3.05 MIL/uL — ABNORMAL LOW (ref 3.87–5.11)
RDW: 24.5 % — ABNORMAL HIGH (ref 11.5–15.5)
WBC: 7.2 10*3/uL (ref 4.0–10.5)
nRBC: 0 % (ref 0.0–0.2)

## 2023-04-10 LAB — PHOSPHORUS: Phosphorus: 2.3 mg/dL — ABNORMAL LOW (ref 2.5–4.6)

## 2023-04-10 LAB — HEMOGLOBIN AND HEMATOCRIT, BLOOD
HCT: 26 % — ABNORMAL LOW (ref 36.0–46.0)
Hemoglobin: 7.8 g/dL — ABNORMAL LOW (ref 12.0–15.0)

## 2023-04-10 LAB — BASIC METABOLIC PANEL
Anion gap: 4 — ABNORMAL LOW (ref 5–15)
BUN: 11 mg/dL (ref 8–23)
CO2: 23 mmol/L (ref 22–32)
Calcium: 7.4 mg/dL — ABNORMAL LOW (ref 8.9–10.3)
Chloride: 112 mmol/L — ABNORMAL HIGH (ref 98–111)
Creatinine, Ser: 0.61 mg/dL (ref 0.44–1.00)
GFR, Estimated: >60 mL/min (ref >60)
Glucose, Bld: 120 mg/dL — ABNORMAL HIGH (ref 70–99)
Potassium: 3.5 mmol/L (ref 3.5–5.1)
Sodium: 139 mmol/L (ref 135–145)

## 2023-04-10 LAB — GLUCOSE, CAPILLARY
Glucose-Capillary: 113 mg/dL — ABNORMAL HIGH (ref 70–99)
Glucose-Capillary: 132 mg/dL — ABNORMAL HIGH (ref 70–99)
Glucose-Capillary: 151 mg/dL — ABNORMAL HIGH (ref 70–99)
Glucose-Capillary: 152 mg/dL — ABNORMAL HIGH (ref 70–99)

## 2023-04-10 LAB — MAGNESIUM: Magnesium: 1.5 mg/dL — ABNORMAL LOW (ref 1.7–2.4)

## 2023-04-10 MED ORDER — K PHOS MONO-SOD PHOS DI & MONO 155-852-130 MG PO TABS
500.0000 mg | ORAL_TABLET | Freq: Two times a day (BID) | ORAL | Status: AC
  Administered 2023-04-10 (×2): 500 mg via ORAL
  Filled 2023-04-10 (×2): qty 2

## 2023-04-10 MED ORDER — MAGNESIUM SULFATE 2 GM/50ML IV SOLN
2.0000 g | Freq: Once | INTRAVENOUS | Status: AC
  Administered 2023-04-10: 2 g via INTRAVENOUS
  Filled 2023-04-10: qty 50

## 2023-04-10 MED ORDER — SALINE SPRAY 0.65 % NA SOLN
1.0000 | NASAL | Status: DC | PRN
  Administered 2023-04-10 – 2023-04-11 (×3): 1 via NASAL
  Filled 2023-04-10: qty 44

## 2023-04-10 NOTE — Evaluation (Signed)
Physical Therapy Evaluation Patient Details Name: Zoe Cobb MRN: [id number] DOB: 11-11-1955 Today's Date: 04/10/2023  History of Present Illness  Zoe Cobb is a 67 y.o. female with PMH significant for morbid obesity, DM2, HTN, CHF, A-fib on Coumadin, arthritis.  Multiple admissions since 2023 March for acute anemia requiring blood transfusions.     8/5 underwent hysteroscopy with endometrial sampling.  Pathology showed endometrioid carcinoma .      8/31, patient presented to the ED for evaluation of heavy bleeding and abdominal pain,In the ED, patient's heart rhythm flipped to A-fib with RVR in the setting of persistent bleeding and hypotension.  Clinical Impression  Pt admitted with above diagnosis.  Pt currently with functional limitations due to the deficits listed below (see PT Problem List). Pt will benefit from acute skilled PT to increase their independence and safety with mobility to allow discharge.    The patient is total care  PTA, uses hoyer lift to power chair. Once in power chair, mobile in and out of apartment.  Patient resides with family, has  PCA 2 times a day to assist with ADL's and transfers.  PT goals are limited in hospital.  Will  make efforts to  mobilize out if bed with lift equipment.         If plan is discharge home, recommend the following: Two people to help with walking and/or transfers;A lot of help with bathing/dressing/bathroom;Assistance with cooking/housework;Assist for transportation;Help with stairs or ramp for entrance   Can travel by private vehicle        Equipment Recommendations None recommended by PT  Recommendations for Other Services       Functional Status Assessment       Precautions / Restrictions Precautions Precaution Comments: morbid obesity Restrictions Weight Bearing Restrictions: No      Mobility  Bed Mobility               General bed mobility comments: rolling with + 6  to each side    Transfers                    General transfer comment: uses a hoyer    Ambulation/Gait                  Stairs            Wheelchair Mobility     Tilt Bed    Modified Rankin (Stroke Patients Only)       Balance                                             Pertinent Vitals/Pain Pain Assessment Pain Assessment: No/denies pain    Home Living Family/patient expects to be discharged to:: Private residence Living Arrangements: Spouse/significant other Available Help at Discharge: Personal care attendant Type of Home: Apartment Home Access: Level entry       Home Layout: One level Home Equipment: Wheelchair - power;Hospital bed Additional Comments: hoyer lift, PCA 3 hrs in am and 2 hours in PM, Hoyer OOB in AM, uses power chair , then back to bed    Prior Function Prior Level of Function : Needs assist       Physical Assist : Mobility (physical) Mobility (physical): Bed mobility;Transfers   Mobility Comments: hoyer, has not ambulated since 1996 ADLs Comments: uses chuck pads for B/B  Extremity/Trunk Assessment   Upper Extremity Assessment Upper Extremity Assessment: RUE deficits/detail;LUE deficits/detail;Right hand dominant RUE Deficits / Details: limited shoulder elevation  ~ 45*, elbow and hand WFL LUE Deficits / Details: limited  shoulder elevation ~ 30*, hand and elb WFL    Lower Extremity Assessment Lower Extremity Assessment: LLE deficits/detail;RLE deficits/detail RLE Deficits / Details: flexes  hip/knee ~ 20* in supine, SLR ~ 3 ", ankle WFL LLE Deficits / Details: limited hip/knee flex in supine, SLR ~ 2 ", ankle WFL    Cervical / Trunk Assessment Cervical / Trunk Assessment: Other exceptions Cervical / Trunk Exceptions: body habitus  Communication      Cognition Arousal: Alert Behavior During Therapy: WFL for tasks assessed/performed Overall Cognitive Status: Within Functional Limits for tasks assessed                                           General Comments General comments (skin integrity, edema, etc.): NT,    Exercises     Assessment/Plan    PT Assessment Patient needs continued PT services  PT Problem List Decreased strength;Obesity       PT Treatment Interventions Therapeutic activities;Patient/family education    PT Goals (Current goals can be found in the Care Plan section)  Acute Rehab PT Goals Patient Stated Goal: go home PT Goal Formulation: With patient Time For Goal Achievement: 04/24/23 Potential to Achieve Goals: Fair    Frequency Min 1X/week     Co-evaluation               AM-PAC PT "6 Clicks" Mobility  Outcome Measure Help needed turning from your back to your side while in a flat bed without using bedrails?: Total Help needed moving from lying on your back to sitting on the side of a flat bed without using bedrails?: Total Help needed moving to and from a bed to a chair (including a wheelchair)?: Total Help needed standing up from a chair using your arms (e.g., wheelchair or bedside chair)?: Total Help needed to walk in hospital room?: Total Help needed climbing 3-5 steps with a railing? : Total 6 Click Score: 6    End of Session   Activity Tolerance: Patient tolerated treatment well Patient left: in bed;with call bell/phone within reach   PT Visit Diagnosis: Muscle weakness (generalized) (M62.81)    Time: 2725-3664 PT Time Calculation (min) (ACUTE ONLY): 12 min   Charges:   PT Evaluation $PT Eval Low Complexity: 1 Low   PT General Charges $$ ACUTE PT VISIT: 1 Visit         Blanchard Kelch PT Acute Rehabilitation Services Office [phone] Weekend [phone]   Rada Hay 04/10/2023, 1:58 PM

## 2023-04-10 NOTE — Care Management (Signed)
Transition of Care [facility]) - Inpatient Brief Assessment   Patient Details  Name: Zoe Cobb MRN: [id number] Date of Birth: 1955-10-07  Transition of Care [facility]) CM/SW Contact:    Lavenia Atlas, RN Phone Number: 04/10/2023, 6:01 PM   Clinical Narrative: Per chart review patient is from home with PCA twice daily with [facility].  PTA DME: hoyer lift and power chair.  Patient is morbidly obese and will require PTAR transport a discharge.  No current TOC needs.   If any needs arise please place [facility] consult.   Transition of Care Asessment: Insurance and Status: Insurance coverage has been reviewed Patient has primary care physician: Yes Home environment has been reviewed: from home with family Prior level of function:: total care w/PCA 2 times daily Art therapist) Prior/Current Home Services: Current home services ([facility]- PCA twice daily) Social Determinants of Health Reivew: SDOH reviewed no interventions necessary Readmission risk has been reviewed: Yes Transition of care needs: no transition of care needs at this time

## 2023-04-10 NOTE — Progress Notes (Signed)
GYN Oncology Progress Note  HPI: Zoe Cobb is a 67 y.o. woman who was seen in consultation as an inpt at the request of Dr. Kirby Crigler for evaluation of vaginal bleeding after recent hysteroscopy, endometrial sampling and Mirena IUD placement on 04/04/23. 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   Interval: She reports doing well this am. Denies pain. Tolerating diet with no nausea or emesis. Reports the vaginal bleeding as light with no clots per RN.   Exam: Alert, oriented, in no acute distress. Lungs clear. Heart in regular rate and rhythm. Abdomen morbidly obese, soft with active bowel sounds. Generalized lower extrem edema. Foley in place with yellow urine.  CBC    Component Value Date/Time   WBC 7.2 04/10/2023 0305   RBC 3.05 (L) 04/10/2023 0305   HGB 7.8 (L) 04/10/2023 0828   HCT 26.0 (L) 04/10/2023 0828   PLT 180 04/10/2023 0305   MCV 82.3 04/10/2023 0305   MCH 24.9 (L) 04/10/2023 0305   MCHC 30.3 04/10/2023 0305   RDW 24.5 (H) 04/10/2023 0305   LYMPHSABS 1.4 04/10/2023 0305   MONOABS 0.6 04/10/2023 0305   EOSABS 0.2 04/10/2023 0305   BASOSABS 0.0 04/10/2023 0305    Hgb 7.6 and Hct 25.1 at 3 am today. Repeat with Hgb at 7.8 and Hct 26. Mag 1.5. Phos 2.3. Continue plan of care per the Hospitalist Team.  Per Dr. Winferd Humphrey consult note from 04/08/23: A-fib and anticoagulation: The patient has had multiple admissions and emergency department visits for bleeding in the setting of supratherapeutic INR.  It may be worth a discussion about whether there is another option for her anticoagulation now in the setting of endometrial cancer which puts her at risk of future episodes of bleeding. I would recommend continuing to hold her Coumadin at the time of discharge.  If bleeding remains normal for 5-7 days after discharge, we will reassess restarting blood thinner. Recommend discussion with  cardiologist and/or hematology regarding any other option for anticoagulation given multiple admissions with supratherapeutic INR

## 2023-04-10 NOTE — Progress Notes (Signed)
PROGRESS NOTE  Zoe Cobb  DOB: 1956/07/15  PCP: Marletta Lor, NP ZOX:[id number]  DOA: 04/07/2023  LOS: 3 days  Hospital Day: 4  Brief narrative: Zoe Cobb is a 67 y.o. female with PMH significant for morbid obesity, DM2, HTN, CHF, A-fib on Coumadin, arthritis. Patient had multiple admissions since 2023 March for acute anemia requiring blood transfusions.  Pelvic ultrasound in July 2024 showed of 14 cm mass in the uterus.  Seen by GYN as an outpatient.  8/5 underwent hysteroscopy with endometrial sampling and IUD placement.  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  8/28, CT underwent repeat sampling which confirmed previous pathology finding.  She is chronically on Coumadin and was held for the procedure and resumed on 8/29.  Soon after that, patient started to have heavy vaginal bleeding with clots. 8/31, patient presented to the ED for evaluation of heavy bleeding and abdominal pain  In the ED, she had mild hypotension Labs with hemoglobin 8.3 and INR 2.3.  Given a unit of PRBC.  Also given 1 dose of Kcentra to reverse INR CT scan showed blood clot in the cervix Started on IV antibiotics for possible endometritis Admitted to [facility] GYN oncology was consulted  Subjective: Patient was seen and examined this morning.  Lying on bed.  Not in distress.  Reports improving vaginal bleeding.   Hemoglobin stable for last 12 hours at least  Assessment and plan: Persistent vaginal bleeding  Endometrial carcinoma Patient presented with heavy vaginal bleeding after endometrial sampling that confirmed endometrioid carcinoma GYN oncology following. Patient states her bleeding is slower now. If bleeding continues or worsens, noted a tentative plan to start oral progesterone versus uterine artery embolization  Acute on chronic blood loss anemia Chronic anticoagulation Chronic iron deficiency Chronic vitamin B12 deficiency Hemoglobin at  presentation was over 7.2.  Received a total of 4 units of PRBC so far.  Hemoglobin is 7.8 this morning, roughly stable for last 12 hours.  Continue to monitor Last anemia panel from July 2024 showed low ferritin level.  1 dose of IV iron was given yesterday 9/2. Continue oral iron and vitamin B12 supplement Anticoagulant hold.  Continue to monitor. Recent Labs    09/09/22 1603 09/09/22 1700 09/09/22 2204 03/02/23 0813 03/02/23 1816 04/08/23 1821 04/08/23 1936 04/09/23 1426 04/09/23 1651 04/09/23 2040 04/10/23 0305 04/10/23 0828  HGB 8.4*  --    < >  --    < > 7.6*   < > 7.5* 8.5* 7.8* 7.6* 7.8*  MCV  --   --    < >  --    < > 79.0*  --   --   --   --  82.3  --   VITAMINB12  --   --   --  228  --   --   --   --   --   --   --   --   FOLATE  --   --   --  23.2  --   --   --   --   --   --   --   --   FERRITIN 14  --   --  16  --   --   --   --   --   --   --   --   TIBC 354  --   --  252  --   --   --   --   --   --   --   --  IRON 25*  --   --  18*  --   --   --   --   --   --   --   --   RETICCTPCT  --  1.1  --  1.2  --   --   --   --   --   --   --   --    < > = values in this interval not displayed.   Hypotension  H/o HTN, chronic diastolic CHF Blood pressure running low in low 100s due to bleeding PTA meds-Toprol 50 mg daily, Imdur 60 mg daily, torsemide 20 mg twice daily Because of hypotension, currently she is continued only on metoprolol.  I would keep Imdur and torsemide on hold.     A fib with RVR In the ED, patient's heart rhythm flipped to A-fib with RVR in the setting of persistent bleeding and hypotension.   Currently continued on metoprolol. Chronically anticoagulated with Coumadin.  On hold for now because of bleeding. In the setting of bleeding, patient was also given Kcentra.  INR now in normal range. Recent Labs  Lab 04/07/23 0958 04/07/23 2240 04/08/23 0338 04/09/23 0253  INR 2.3* 1.2 1.2 1.2   Hypokalemia/hypomagnesemia/hypophosphatemia Potassium  level improved with replacement. Labs today showed low magnesium and phosphorus level.  Replacement ordered. Recent Labs  Lab 04/07/23 0958 04/08/23 0338 04/09/23 0253 04/10/23 0305  K 4.1 3.6 3.1* 3.5  MG  --   --   --  1.5*  PHOS  --   --   --  2.3*   Type 2 diabetes mellitus A1c 6.9 on 03/09/2023, could be falsely low because of chronic anemia PTA meds-metformin 1000 mg twice daily Continue SSI/Accu-Cheks Recent Labs  Lab 04/09/23 0829 04/09/23 1207 04/09/23 1626 04/09/23 2047 04/10/23 0730  GLUCAP 137* 119* 131* 168* 113*   Morbid Obesity  Body mass index is 78.31 kg/m. Patient has been advised to make an attempt to improve diet and exercise patterns to aid in weight loss.  Mobility: Wheelchair-bound since 1996.  Uses Hoyer lift at home  Goals of care   Code Status: Full Code     DVT prophylaxis:  SCDs Start: 04/07/23 1333   Antimicrobials: None currently Fluid: Not on IV fluid currently Consultants: GYN oncology Family Communication: None at bedside  Status: Inpatient Level of care:  Stepdown   Patient is from: Home Needs to continue in-hospital care: Continues to have vaginal bleeding and requiring blood transfusion Anticipated d/c to: Pending clinical course      Diet:  Diet Order             Diet heart healthy/carb modified Room service appropriate? Yes; Fluid consistency: Thin  Diet effective now                   Scheduled Meds:  sodium chloride   Intravenous Once   Chlorhexidine Gluconate Cloth  6 each Topical Daily   feeding supplement  237 mL Oral BID BM   insulin aspart  0-15 Units Subcutaneous TID WC   insulin aspart  0-5 Units Subcutaneous QHS   metoprolol tartrate  25 mg Oral BID   phosphorus  500 mg Oral BID    PRN meds: acetaminophen **OR** acetaminophen, albuterol, guaiFENesin-dextromethorphan, HYDROmorphone (DILAUDID) injection, ondansetron **OR** ondansetron (ZOFRAN) IV, mouth rinse, pneumococcal 20-valent conjugate  vaccine, traZODone   Infusions:   magnesium sulfate bolus IVPB 2 g (04/10/23 1033)     Antimicrobials: Anti-infectives (From admission, onward)  Start     Dose/Rate Route Frequency Ordered Stop   04/07/23 1430  cefTRIAXone (ROCEPHIN) 2 g in sodium chloride 0.9 % 100 mL IVPB  Status:  Discontinued        2 g 200 mL/hr over 30 Minutes Intravenous Every 24 hours 04/07/23 1400 04/08/23 0908   04/07/23 1430  doxycycline (VIBRAMYCIN) 100 mg in sodium chloride 0.9 % 250 mL IVPB  Status:  Discontinued        100 mg 125 mL/hr over 120 Minutes Intravenous Every 12 hours 04/07/23 1400 04/08/23 0908       Nutritional status:  Body mass index is 78.31 kg/m.          Objective: Vitals:   04/10/23 0800 04/10/23 1030  BP: 104/66 138/84  Pulse: 79 75  Resp: 20 16  Temp:    SpO2: 95% 97%    Intake/Output Summary (Last 24 hours) at 04/10/2023 1045 Last data filed at 04/10/2023 0847 Gross per 24 hour  Intake 1745.52 ml  Output 1600 ml  Net 145.52 ml   Filed Weights   04/07/23 0923 04/07/23 1627  Weight: (!) 188 kg (!) 188 kg   Weight change:  Body mass index is 78.31 kg/m.   Physical Exam: General exam: Elderly morbidly obese African-American female.  Morbidly obese.  Not in pain Skin: No rashes, lesions or ulcers. HEENT: Atraumatic, normocephalic, no obvious bleeding Lungs: Clear to auscultation bilaterally CVS: Regular rate and rhythm, no murmur GI/Abd: soft, nontender, distended from obesity, bowel sound present CNS: Alert, awake, oriented x 3 Psychiatry: Mood appropriate Extremities: Chronic bilateral lower extremity lymphedema  Data Review: I have personally reviewed the laboratory data and studies available.  F/u labs ordered Unresulted Labs (From admission, onward)     Start     Ordered   04/10/23 0500  Basic metabolic panel  Daily,   R     Question:  Specimen collection method  Answer:  Lab=Lab collect   04/09/23 0854   04/10/23 0500  CBC with  Differential/Platelet  Daily,   R     Question:  Specimen collection method  Answer:  Lab=Lab collect   04/09/23 0854            Total time spent in review of labs and imaging, patient evaluation, formulation of plan, documentation and communication with family: 45 minutes  Signed, Lorin Glass, MD [facility] 04/10/2023

## 2023-04-11 ENCOUNTER — Other Ambulatory Visit: Payer: Self-pay | Admitting: Gynecologic Oncology

## 2023-04-11 ENCOUNTER — Telehealth: Payer: Self-pay | Admitting: Oncology

## 2023-04-11 DIAGNOSIS — C541 Malignant neoplasm of endometrium: Secondary | ICD-10-CM

## 2023-04-11 DIAGNOSIS — I4891 Unspecified atrial fibrillation: Secondary | ICD-10-CM | POA: Diagnosis not present

## 2023-04-11 DIAGNOSIS — Z7901 Long term (current) use of anticoagulants: Secondary | ICD-10-CM

## 2023-04-11 DIAGNOSIS — D62 Acute posthemorrhagic anemia: Secondary | ICD-10-CM | POA: Diagnosis not present

## 2023-04-11 LAB — BPAM RBC
Blood Product Expiration Date: 202409222359
Blood Product Expiration Date: 202409222359
Blood Product Expiration Date: 202409222359
Blood Product Expiration Date: 202409232359
Blood Product Expiration Date: 202409232359
ISSUE DATE / TIME: 202408311622
ISSUE DATE / TIME: 202409011111
ISSUE DATE / TIME: 202409011437
ISSUE DATE / TIME: 202409020801
Unit Type and Rh: 6200
Unit Type and Rh: 6200
Unit Type and Rh: 6200
Unit Type and Rh: 6200
Unit Type and Rh: 6200

## 2023-04-11 LAB — TYPE AND SCREEN
ABO/RH(D): A POS
Antibody Screen: NEGATIVE
Unit division: 0
Unit division: 0
Unit division: 0
Unit division: 0
Unit division: 0

## 2023-04-11 LAB — CBC WITH DIFFERENTIAL/PLATELET
Abs Immature Granulocytes: 0.04 10*3/uL (ref 0.00–0.07)
Basophils Absolute: 0 10*3/uL (ref 0.0–0.1)
Basophils Relative: 1 %
Eosinophils Absolute: 0.2 10*3/uL (ref 0.0–0.5)
Eosinophils Relative: 3 %
HCT: 25.8 % — ABNORMAL LOW (ref 36.0–46.0)
Hemoglobin: 7.7 g/dL — ABNORMAL LOW (ref 12.0–15.0)
Immature Granulocytes: 1 %
Lymphocytes Relative: 22 %
Lymphs Abs: 1.5 10*3/uL (ref 0.7–4.0)
MCH: 25 pg — ABNORMAL LOW (ref 26.0–34.0)
MCHC: 29.8 g/dL — ABNORMAL LOW (ref 30.0–36.0)
MCV: 83.8 fL (ref 80.0–100.0)
Monocytes Absolute: 0.6 10*3/uL (ref 0.1–1.0)
Monocytes Relative: 9 %
Neutro Abs: 4.2 10*3/uL (ref 1.7–7.7)
Neutrophils Relative %: 64 %
Platelets: 198 10*3/uL (ref 150–400)
RBC: 3.08 MIL/uL — ABNORMAL LOW (ref 3.87–5.11)
RDW: 25.4 % — ABNORMAL HIGH (ref 11.5–15.5)
WBC: 6.6 10*3/uL (ref 4.0–10.5)
nRBC: 0 % (ref 0.0–0.2)

## 2023-04-11 LAB — BASIC METABOLIC PANEL
Anion gap: 6 (ref 5–15)
BUN: 10 mg/dL (ref 8–23)
CO2: 23 mmol/L (ref 22–32)
Calcium: 7.5 mg/dL — ABNORMAL LOW (ref 8.9–10.3)
Chloride: 110 mmol/L (ref 98–111)
Creatinine, Ser: 0.6 mg/dL (ref 0.44–1.00)
GFR, Estimated: >60 mL/min (ref >60)
Glucose, Bld: 132 mg/dL — ABNORMAL HIGH (ref 70–99)
Potassium: 3.5 mmol/L (ref 3.5–5.1)
Sodium: 139 mmol/L (ref 135–145)

## 2023-04-11 LAB — GLUCOSE, CAPILLARY
Glucose-Capillary: 113 mg/dL — ABNORMAL HIGH (ref 70–99)
Glucose-Capillary: 124 mg/dL — ABNORMAL HIGH (ref 70–99)

## 2023-04-11 NOTE — Discharge Summary (Signed)
Physician Discharge Summary  Zoe Cobb ZOX:[id number] DOB: 1955-10-03 DOA: 04/07/2023  PCP: Marletta Lor, NP  Admit date: 04/07/2023 Discharge date: 04/11/2023  Admitted From: Home Discharge disposition: Home  Recommendations at discharge:  Continue to hold Coumadin Follow-up with GYN as an outpatient Follow-up with cardiology as an outpatient    Brief narrative: Zoe Cobb is a 67 y.o. female with PMH significant for morbid obesity, DM2, HTN, CHF, A-fib on Coumadin, arthritis. Patient had multiple admissions since 2023 March for acute anemia requiring blood transfusions.  Pelvic ultrasound in July 2024 showed of 14 cm mass in the uterus.  Seen by GYN as an outpatient.  8/5 underwent hysteroscopy with endometrial sampling and IUD placement.  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  8/28, CT underwent repeat sampling which confirmed previous pathology finding.  She is chronically on Coumadin and was held for the procedure and resumed on 8/29.  Soon after that, patient started to have heavy vaginal bleeding with clots. 8/31, patient presented to the ED for evaluation of heavy bleeding and abdominal pain  In the ED, she had mild hypotension Labs with hemoglobin 8.3 and INR 2.3.  Given a unit of PRBC.  Also given 1 dose of Kcentra to reverse INR CT scan showed blood clot in the cervix Started on IV antibiotics for possible endometritis Admitted to [facility] GYN oncology was consulted  Subjective: Patient was seen and examined this morning.  Lying down in bed.  Not in distress.  Minimal vaginal bleeding.  Hemoglobin stable. GYN oncology follow-up appreciated.  Assessment and plan: Persistent vaginal bleeding  Endometrial carcinoma Patient presented with heavy vaginal bleeding after endometrial sampling that confirmed endometrioid carcinoma GYN oncology consult was obtained.  Patient was monitored for vaginal bleeding for several days.   Gradually improved.  Hemoglobin stable. Foley catheter was inserted for proper monitoring of vaginal bleeding.  DC Foley today.  Voiding trial successful Per GYN, MRI ordered as an outpatient.  Continue to follow-up as an outpatient.  Acute on chronic blood loss anemia Chronic anticoagulation Chronic iron deficiency Chronic vitamin B12 deficiency Hemoglobin at presentation was over 7.2.  Received a total of 4 units of PRBC so far.  Hemoglobin is stable between 7 and 8 for last 48 hours Last anemia panel from July 2024 showed low ferritin level.  1 dose of IV iron was given yesterday 9/2.   Continue oral iron and vitamin B12 supplement Anticoagulant hold.  See below. Recent Labs    09/09/22 1603 09/09/22 1700 09/09/22 2204 03/02/23 0813 03/02/23 1816 04/09/23 1651 04/09/23 2040 04/10/23 0305 04/10/23 0828 04/11/23 0310  HGB 8.4*  --    < >  --    < > 8.5* 7.8* 7.6* 7.8* 7.7*  MCV  --   --    < >  --    < >  --   --  82.3  --  83.8  VITAMINB12  --   --   --  228  --   --   --   --   --   --   FOLATE  --   --   --  23.2  --   --   --   --   --   --   FERRITIN 14  --   --  16  --   --   --   --   --   --   TIBC 354  --   --  252  --   --   --   --   --   --  IRON 25*  --   --  18*  --   --   --   --   --   --   RETICCTPCT  --  1.1  --  1.2  --   --   --   --   --   --    < > = values in this interval not displayed.   A fib with RVR Has past history of A-fib.  In the ED, patient's heart rhythm flipped to A-fib with RVR in the setting of persistent bleeding and hypotension.   Currently rate controlled on home dose of metoprolol She was chronically anticoagulated with Coumadin.  INR was in therapeutic range at presentation but had to be reversed with Kcentra because of ongoing bleeding.  INR now in normal range. Because of her continuous bleeding, I would recommend to keep Coumadin on hold for another week.  Noted a plan from GYN oncology to call the patient next week and if no  significant bleeding, plan to resume Coumadin.  I will defer to her cardiologist Dr. Jacinto Halim as an outpatient to see if she can be transition to Eliquis or Xarelto.  I have sent a staff message to Dr. Jacinto Halim Recent Labs  Lab 04/07/23 1610 04/07/23 2240 04/08/23 0338 04/09/23 0253  INR 2.3* 1.2 1.2 1.2   Hypotension  H/o HTN, chronic diastolic CHF Blood pressure running low in low 100s due to bleeding PTA meds-Toprol 50 mg daily, Imdur 60 mg daily, torsemide 20 mg twice daily Currently her blood pressure is running in low 100s only on metoprolol. Imdur and torsemide on hold.  Patient is bedbound for several years and is not at risk of orthostatic syncope and fall.  Given her volume status and history of heart disease, I will resume Imdur and torsemide at discharge.  Hypokalemia/hypomagnesemia/hypophosphatemia Potassium, magnesium and phosphorus level trend as below.  Replacement given. Recent Labs  Lab 04/07/23 0958 04/08/23 0338 04/09/23 0253 04/10/23 0305 04/11/23 0310  K 4.1 3.6 3.1* 3.5 3.5  MG  --   --   --  1.5*  --   PHOS  --   --   --  2.3*  --    Type 2 diabetes mellitus A1c 6.9 on 03/09/2023, could be falsely low because of chronic anemia PTA meds-metformin 1000 mg twice daily Continue same Cobb discharge. Recent Labs  Lab 04/10/23 1113 04/10/23 1627 04/10/23 2107 04/11/23 0740 04/11/23 1202  GLUCAP 132* 151* 152* 113* 124*   Morbid Obesity  Body mass index is 78.31 kg/m. Patient has been advised to make an attempt to improve diet and exercise patterns to aid in weight loss.  Impaired mobility Wheelchair-bound since 1996.  Uses [employer] lift at home  Goals of care   Code Status: Full Code   Wounds:  -    Discharge Exam:   Vitals:   04/11/23 1100 04/11/23 1200 04/11/23 1202 04/11/23 1300  BP: 112/70 112/75  (!) 94/54  Pulse: 70 71  65  Resp: 17 15  (!) 24  Temp:   98.2 F (36.8 C)   TempSrc:   Oral   SpO2: 93% 96%  96%  Weight:      Height:         Body mass index is 78.31 kg/m.   General exam: Elderly morbidly obese African-American female.  Morbidly obese.  Not in pain Skin: No rashes, lesions or ulcers. HEENT: Atraumatic, normocephalic, no obvious bleeding Lungs: Clear to auscultation bilaterally CVS: Regular rate  and rhythm, no murmur GI/Abd: soft, nontender, distended from obesity, bowel sound present CNS: Alert, awake, oriented x 3 Psychiatry: Mood appropriate Extremities: Chronic bilateral lower extremity lymphedema  Follow ups:    Follow-up Information     Marletta Lor, NP Follow up.   Specialty: Nurse Practitioner Contact information: [facility] [address] [place] [address] [phone]                 Discharge Instructions:   Discharge Instructions     Call MD for:  difficulty breathing, headache or visual disturbances   Complete by: As directed    Call MD for:  extreme fatigue   Complete by: As directed    Call MD for:  hives   Complete by: As directed    Call MD for:  persistant dizziness or light-headedness   Complete by: As directed    Call MD for:  persistant nausea and vomiting   Complete by: As directed    Call MD for:  severe uncontrolled pain   Complete by: As directed    Call MD for:  temperature >100.4   Complete by: As directed    Diet general   Complete by: As directed    Discharge instructions   Complete by: As directed    Recommendations at discharge:   Continue to hold Coumadin  Follow-up with GYN as an outpatient  Follow-up with cardiology as an outpatient  General discharge instructions: Follow with Primary MD Marletta Lor, NP in 7 days  Please request your PCP  to go over your hospital tests, procedures, radiology results at the follow up. Please get your medicines reviewed and adjusted.  Your PCP may decide to repeat certain labs or tests as needed. Do not drive, operate heavy machinery, perform activities at heights, swimming or  participation in water activities or provide baby sitting services if your were admitted for syncope or siezures until you have seen by Primary MD or a Neurologist and advised to do so again. [place] Controlled Substance Reporting System database was reviewed. Do not drive, operate heavy machinery, perform activities at heights, swim, participate in water activities or provide baby-sitting services while on medications for pain, sleep and mood until your outpatient physician has reevaluated you and advised to do so again.  You are strongly recommended to comply with the dose, frequency and duration of prescribed medications. Activity: As tolerated with Full fall precautions use walker/cane & assistance as needed Avoid using any recreational substances like cigarette, tobacco, alcohol, or non-prescribed drug. If you experience worsening of your admission symptoms, develop shortness of breath, life threatening emergency, suicidal or homicidal thoughts you must seek medical attention immediately by calling 911 or calling your MD immediately  if symptoms less severe. You must read complete instructions/literature along with all the possible adverse reactions/side effects for all the medicines you take and that have been prescribed to you. Take any new medicine only after you have completely understood and accepted all the possible adverse reactions/side effects.  Wear Seat belts while driving. You were cared for by a hospitalist during your hospital stay. If you have any questions about your discharge medications or the care you received while you were in the hospital after you are discharged, you can call the unit and ask to speak with the hospitalist or the covering physician. Once you are discharged, your primary care physician will handle any further medical issues. Please note that NO REFILLS for any discharge  medications will be authorized once you are discharged, as it is imperative that you return  to your primary care physician (or establish a relationship with a primary care physician if you do not have one).   Increase activity slowly   Complete by: As directed        Discharge Medications:   Allergies as of 04/11/2023       Reactions   Penicillins Swelling   Has patient had a PCN reaction causing immediate rash, facial/tongue/throat swelling, SOB or lightheadedness with hypotension: Yes Has patient had a PCN reaction causing severe rash involving mucus membranes or skin necrosis: No Has patient had a PCN reaction that required hospitalization No Has patient had a PCN reaction occurring within the last 10 years: No If all of the above answers are "NO", then may proceed with Cephalosporin use.        Medication List     STOP taking these medications    warfarin 5 MG tablet Commonly known as: COUMADIN       TAKE these medications    acetaminophen 325 MG tablet Commonly known as: TYLENOL Take 650 mg by mouth every 6 (six) hours as needed for moderate pain.   cyanocobalamin 500 MCG tablet Commonly known as: VITAMIN B12 Take 1 tablet (500 mcg total) by mouth daily.   ferrous sulfate 325 (65 FE) MG EC tablet Take 1 tablet (325 mg total) by mouth 2 (two) times daily. What changed: when to take this   isosorbide mononitrate 60 MG 24 hr tablet Commonly known as: IMDUR Take 60 mg by mouth daily.   metFORMIN 1000 MG tablet Commonly known as: GLUCOPHAGE Take 1,000 mg by mouth 2 (two) times daily.   metoprolol succinate 50 MG 24 hr tablet Commonly known as: TOPROL-XL Take 50 mg by mouth every evening.   ONE A DAY WOMEN 50 PLUS PO Take 1 tablet by mouth daily.   torsemide 20 MG tablet Commonly known as: DEMADEX Take 20 mg by mouth 2 (two) times daily.   traMADol 50 MG tablet Commonly known as: ULTRAM Take 50 mg by mouth every 8 (eight) hours as needed for moderate pain.         The results of significant diagnostics from this hospitalization  (including imaging, microbiology, ancillary and laboratory) are listed below for reference.    Procedures and Diagnostic Studies:   CT ABDOMEN PELVIS W CONTRAST  Result Date: 04/07/2023 CLINICAL DATA:  Abdominal pain, vaginal bleeding after recent dilatation and curettage procedure EXAM: CT ABDOMEN AND PELVIS WITH CONTRAST TECHNIQUE: Multidetector CT imaging of the abdomen and pelvis was performed using the standard protocol following bolus administration of intravenous contrast. RADIATION DOSE REDUCTION: This exam was performed according to the departmental dose-optimization program which includes automated exposure control, adjustment of the mA and/or kV according to patient size and/or use of iterative reconstruction technique. CONTRAST:  OMNIPAQUE IOHEXOL 300 MG/ML  SOLN COMPARISON:  12/30/2022, 03/01/2023 FINDINGS: Evaluation is limited by patient body habitus. Lower chest: No acute pleural or parenchymal lung disease. Hepatobiliary: Multiple calcified gallstones are identified without evidence of acute cholecystitis. The liver is unremarkable. No biliary duct dilation. Pancreas: Unremarkable. No pancreatic ductal dilatation or surrounding inflammatory changes. Spleen: Normal in size without focal abnormality. Adrenals/Urinary Tract: Stable bilateral renal hypodensities, likely cysts. No specific follow-up is recommended. No urinary tract calculi or obstruction. The adrenals and bladder are unremarkable. Stomach/Bowel: No bowel obstruction or ileus. Normal appendix lower central abdomen. Diverticulosis of the distal colon without  diverticulitis. Moderate retained stool within the rectal vault. Vascular/Lymphatic: Aortic atherosclerosis. No enlarged abdominal or pelvic lymph nodes. Reproductive: There has been interval placement of an IUD. At the level of the cervix there is an 8.0 x 8.3 x 7.2 cm hyperdense masslike area compatible hematoma given recent invasive uterine procedure. There are no  adnexal masses. Other: No free fluid or free intraperitoneal gas. No abdominal wall hernia. Musculoskeletal: No acute or destructive bony abnormalities. Stable multilevel thoracolumbar spondylosis and facet hypertrophy, most pronounced at T11-12 and L5-S1. 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 Reconstructed images demonstrate no additional findings. IMPRESSION: 1. 8.3 cm hyperdense masslike area at the level of cervix, consistent with postprocedural hematoma given recent D and C with IUD placement. 2. Cholelithiasis without acute cholecystitis. 3.  Aortic Atherosclerosis (ICD10-I70.0). Electronically Signed   By: Sharlet Salina M.D.   On: 04/07/2023 16:04     Labs:   Basic Metabolic Panel: Recent Labs  Lab 04/07/23 0958 04/08/23 0338 04/09/23 0253 04/10/23 0305 04/11/23 0310  NA 137 140 136 139 139  K 4.1 3.6 3.1* 3.5 3.5  CL 105 108 108 112* 110  CO2 22 22 23 23 23   GLUCOSE 146* 131* 135* 120* 132*  BUN 32* 24* 17 11 10   CREATININE 0.90 0.67 0.59 0.61 0.60  CALCIUM 7.6* 7.6* 7.1* 7.4* 7.5*  MG  --   --   --  1.5*  --   PHOS  --   --   --  2.3*  --    GFR Estimated Creatinine Clearance: 108.9 mL/min (by C-G formula based on SCr of 0.6 mg/dL). Liver Function Tests: Recent Labs  Lab 04/07/23 0958 04/08/23 0338  AST 18 17  ALT 12 12  ALKPHOS 59 55  BILITOT 0.6 0.7  PROT 7.6 6.9  ALBUMIN 2.4* 2.3*   No results for input(s): "LIPASE", "AMYLASE" in the last 168 hours. No results for input(s): "AMMONIA" in the last 168 hours. Coagulation profile Recent Labs  Lab 04/07/23 0958 04/07/23 2240 04/08/23 0338 04/09/23 0253  INR 2.3* 1.2 1.2 1.2    CBC: Recent Labs  Lab 04/07/23 0958 04/07/23 2240 04/08/23 0338 04/08/23 1036 04/08/23 1821 04/08/23 1936 04/09/23 1651 04/09/23 2040 04/10/23 0305 04/10/23 0828 04/11/23 0310  WBC 8.2  --  11.8*  --  9.7  --   --   --  7.2  --  6.6  NEUTROABS  --   --   --   --   --   --   --   --  4.8  --  4.2   HGB 8.2*   < > 7.2*   < > 7.6*   < > 8.5* 7.8* 7.6* 7.8* 7.7*  HCT 28.4*   < > 24.1*   < > 24.4*   < > 28.1* 25.5* 25.1* 26.0* 25.8*  MCV 74.9*  --  76.8*  --  79.0*  --   --   --  82.3  --  83.8  PLT 242  --  165  --  164  --   --   --  180  --  198   < > = values in this interval not displayed.   Cardiac Enzymes: No results for input(s): "CKTOTAL", "CKMB", "CKMBINDEX", "TROPONINI" in the last 168 hours. BNP: Invalid input(s): "POCBNP" CBG: Recent Labs  Lab 04/10/23 1113 04/10/23 1627 04/10/23 2107 04/11/23 0740 04/11/23 1202  GLUCAP 132* 151* 152* 113* 124*   D-Dimer No results for input(s): "  DDIMER" in the last 72 hours. Hgb A1c No results for input(s): "HGBA1C" in the last 72 hours. Lipid Profile No results for input(s): "CHOL", "HDL", "LDLCALC", "TRIG", "CHOLHDL", "LDLDIRECT" in the last 72 hours. Thyroid function studies No results for input(s): "TSH", "T4TOTAL", "T3FREE", "THYROIDAB" in the last 72 hours.  Invalid input(s): "FREET3" Anemia work up No results for input(s): "VITAMINB12", "FOLATE", "FERRITIN", "TIBC", "IRON", "RETICCTPCT" in the last 72 hours. Microbiology Recent Results (from the past 240 hour(s))  MRSA Next Gen by PCR, Nasal     Status: None   Collection Time: 04/07/23  4:20 PM   Specimen: Nasal Mucosa; Nasal Swab  Result Value Ref Range Status   MRSA by PCR Next Gen NOT DETECTED NOT DETECTED Final    Comment: (NOTE) The GeneXpert MRSA Assay (FDA approved for NASAL specimens only), is one component of a comprehensive MRSA colonization surveillance program. It is not intended to diagnose MRSA infection nor to guide or monitor treatment for MRSA infections. Test performance is not FDA approved in patients less than 43 years old. Performed at [facility], [address] [place], [place] [address]   Culture, blood (Routine X 2) w Reflex to ID Panel     Status: None (Preliminary result)   Collection Time: 04/07/23 10:40 PM    Specimen: BLOOD LEFT HAND  Result Value Ref Range Status   Specimen Description BLOOD LEFT HAND  Final   Special Requests   Final    BOTTLES DRAWN AEROBIC ONLY Blood Culture adequate volume   Culture   Final    NO GROWTH 3 DAYS Performed at [facility], [address] [place], [place] [address]    Report Status PENDING  Incomplete  Culture, blood (Routine X 2) w Reflex to ID Panel     Status: None (Preliminary result)   Collection Time: 04/08/23  3:38 AM   Specimen: Site Not Specified  Result Value Ref Range Status   Specimen Description   Final    SITE NOT SPECIFIED Performed at [facility], [address] [place], [place] [address]    Special Requests   Final    BOTTLES DRAWN AEROBIC ONLY Blood Culture adequate volume Performed at [facility], [address] [place], [place] [address]    Culture   Final    NO GROWTH 3 DAYS Performed at [facility], [address], [place], [place] [address]    Report Status PENDING  Incomplete    Time coordinating discharge: 45 minutes  Signed: Jeananne Bedwell  [facility] 04/11/2023, 2:30 PM

## 2023-04-11 NOTE — Telephone Encounter (Signed)
Called Zoe Cobb and advised her of the MRI appointment on 04/24/23 at 10:00. Gave her instructions to arrive at 9:30 and for NPO 4 hours prior.  She verbalized understanding and agreement.

## 2023-04-11 NOTE — TOC Transition Note (Addendum)
Transition of Care [facility]) - CM/SW Discharge Note   Patient Details  Name: Zoe Cobb MRN: [id number] Date of Birth: [birth year]  Transition of Care [facility]) CM/SW Contact:  Lavenia Atlas, RN Phone Number: 04/11/2023, 1:25 PM   Clinical Narrative:   This RNCM spoke with patient who reports someone is at home to receive her once discharged from [facility] ICU.This RNCM will leave PTAR folder at [employer]. Awaiting patient to void before calling PTAR, MD, RN notified.  TOC following for needs.  - 2:27pm PTAR called, notified patient, RN, MD  No additional TOC needs at this time.  Final next level of care: Home/Self Care Barriers to Discharge: Barriers Resolved   Patient Goals and CMS Choice CMS Medicare.gov Compare Post Acute Care list provided to:: Patient Choice offered to / list presented to : Patient  Discharge Placement                         Discharge Plan and Services Additional resources added to the After Visit Summary for                  DME Arranged: N/A DME Agency: NA       HH Arranged: NA HH Agency: NA        Social Determinants of Health (SDOH) Interventions SDOH Screenings   Food Insecurity: No Food Insecurity (04/08/2023)  Housing: Low Risk  (04/08/2023)  Transportation Needs: No Transportation Needs (04/08/2023)  Utilities: Not At Risk (04/08/2023)  Depression (PHQ2-9): Low Risk  (11/14/2022)  Tobacco Use: Medium Risk (04/07/2023)     Readmission Risk Interventions    04/10/2023    5:55 PM  Readmission Risk Prevention Plan  Transportation Screening Complete  PCP or Specialist Appt within 3-5 Days Complete  HRI or Home Care Consult Complete  Social Work Consult for Recovery Care Planning/Counseling Complete  Palliative Care Screening Not Applicable  Medication Review Oceanographer) Complete

## 2023-04-11 NOTE — Progress Notes (Signed)
GYN Onc Progress Note  Subjective: Patient reports doing well. Denies dizziness. Not much bleeding noted during changes.     Objective: Vital signs in last 24 hours: Temp:  [97.6 F (36.4 C)-98.4 F (36.9 C)] 98.2 F (36.8 C) (09/04 0800) Pulse Rate:  [32-92] 68 (09/04 0800) Resp:  [15-24] 22 (09/04 0800) BP: (106-136)/(53-85) 113/60 (09/04 0800) SpO2:  [93 %-99 %] 97 % (09/04 0800) Last BM Date : 04/09/23  Intake/Output from previous day: 09/03 0701 - 09/04 0700 In: 770 [P.O.:720; IV Piggyback:50] Out: 1100 [Urine:1100]  Physical Examination: General: Alert, oriented, no acute distress. HEENT: Normocephalic, atraumatic.  Sclera anicteric, posterior oropharynx clear.   Chest: Clear to auscultation bilaterally. Cardiovascular: Regular rate and rhythm, no murmurs, rubs, or gallops. Abdomen: Obese.  Normoactive bowel sounds.  Soft, nondistended, nontender to palpation.   Extremities: Trace lower extremity edema bilaterally.  No calf tenderness to palpation.  Warm and well-perfused.  Labs:    Latest Ref Rng & Units 04/11/2023    3:10 AM 04/10/2023    8:28 AM 04/10/2023    3:05 AM  CBC  WBC 4.0 - 10.5 K/uL 6.6   7.2   Hemoglobin 12.0 - 15.0 g/dL 7.7  7.8  7.6   Hematocrit 36.0 - 46.0 % 25.8  26.0  25.1   Platelets 150 - 400 K/uL 198   180       Latest Ref Rng & Units 04/11/2023    3:10 AM 04/10/2023    3:05 AM 04/09/2023    2:53 AM  BMP  Glucose 70 - 99 mg/dL 478  295  621   BUN 8 - 23 mg/dL 10  11  17    Creatinine 0.44 - 1.00 mg/dL 3.08  6.57  8.46   Sodium 135 - 145 mmol/L 139  139  136   Potassium 3.5 - 5.1 mmol/L 3.5  3.5  3.1   Chloride 98 - 111 mmol/L 110  112  108   CO2 22 - 32 mmol/L 23  23  23    Calcium 8.9 - 10.3 mg/dL 7.5  7.4  7.1    Assessment:  67 y.o. 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   Acute on chronic anemia secondary to blood loss: received total of 4u pRBCs. H&H stable now over > 24 hrs with minimal VB reported. HR normal, BP at baseline. Patient denies symptomatic anemia (no further dizziness). Coumadin has been held since admission (received reversal given vaginal bleeding). Will plan to continue to hold Coumadin for 1 week. If no further vaginal bleeding, will plan to restart early next week. Would benefit from discussion with cardiology about anticoagulation options for a fib other than Coumadin given multiple admissions with vaginal and rectal bleeding in the setting of supratherapeutic INR.   Clinical stage I grade 1 endometrioid endometrial adenocarcinoma: Mirena IUD in place. Plan for follow-up in 3 months with repeat biopsy in 3-6 months. Will work to get her scheduled for an MRI.   Recommendations: - Ok to discharge - Plan to hold Coumadin for one week. My office will call the patient next week and if no significant VB, will plan to restart   LOS: 4 days    Carver Fila 04/11/2023, 11:47 AM

## 2023-04-12 ENCOUNTER — Telehealth: Payer: Self-pay

## 2023-04-12 ENCOUNTER — Inpatient Hospital Stay: Payer: 59 | Admitting: Gynecologic Oncology

## 2023-04-12 LAB — SURGICAL PATHOLOGY

## 2023-04-12 NOTE — Telephone Encounter (Signed)
Pt is on follow up call list. Phone appointment changed to 9/13 @ 4:50

## 2023-04-12 NOTE — Telephone Encounter (Signed)
-----   Message from Carver Fila sent at 04/12/2023  6:22 AM EDT ----- HI all, Please put this patient on the list to call on Monday or Tuesday. If minimal bleeding over the next week, she can restart her Coumadin. Please change her phone visit with me to be either end of next week or the following week. Thank you! Georgiann Hahn

## 2023-04-13 ENCOUNTER — Other Ambulatory Visit: Payer: Self-pay | Admitting: Adult Health

## 2023-04-13 ENCOUNTER — Telehealth: Payer: Self-pay

## 2023-04-13 DIAGNOSIS — N6310 Unspecified lump in the right breast, unspecified quadrant: Secondary | ICD-10-CM

## 2023-04-13 LAB — CULTURE, BLOOD (ROUTINE X 2)
Culture: NO GROWTH
Culture: NO GROWTH
Special Requests: ADEQUATE
Special Requests: ADEQUATE

## 2023-04-13 NOTE — Telephone Encounter (Signed)
Zoe Cobb with Home IN[facility] called with critical result:  INR range :0.8

## 2023-04-13 NOTE — Telephone Encounter (Signed)
She should let us know once her vaginal bleeding is completely stopped, I prefer her to start Eliquis 5 mg BID this was she does not need monitoring.   I reviewed her charts from recent hospitalization extensively

## 2023-04-17 ENCOUNTER — Telehealth: Payer: Self-pay | Admitting: *Deleted

## 2023-04-17 NOTE — Telephone Encounter (Signed)
Spoke with Zoe Cobb this morning. Pt states she is doing well and her vaginal bleeding has stopped as of last Friday, September 6th. Pt is currently taking her 2.5 mg coumadin once a day and will see her cardiologist on Thursday, Sept. 12 th. Pt thanked the office for calling and has no further concerns or questions at this time.

## 2023-04-17 NOTE — Telephone Encounter (Signed)
-----   Message from Doylene Bode sent at 04/13/2023  2:09 PM EDT -----  ----- Message ----- From: Carver Fila, MD Sent: 04/12/2023   6:23 AM EDT To: Doylene Bode, NP; Eduardo Osier, RN; #  HI all, Please put this patient on the list to call on Monday or Tuesday. If minimal bleeding over the next week, she can restart her Coumadin. Please change her phone visit with me to be either end of next week or the following week. Thank you! Georgiann Hahn

## 2023-04-19 ENCOUNTER — Ambulatory Visit: Payer: 59 | Admitting: Cardiology

## 2023-04-19 DIAGNOSIS — I480 Paroxysmal atrial fibrillation: Secondary | ICD-10-CM

## 2023-04-20 ENCOUNTER — Inpatient Hospital Stay: Payer: 59 | Attending: Gynecologic Oncology | Admitting: Gynecologic Oncology

## 2023-04-20 ENCOUNTER — Encounter: Payer: Self-pay | Admitting: Gynecologic Oncology

## 2023-04-20 DIAGNOSIS — C541 Malignant neoplasm of endometrium: Secondary | ICD-10-CM

## 2023-04-20 DIAGNOSIS — Z7901 Long term (current) use of anticoagulants: Secondary | ICD-10-CM

## 2023-04-20 DIAGNOSIS — I480 Paroxysmal atrial fibrillation: Secondary | ICD-10-CM | POA: Diagnosis not present

## 2023-04-20 DIAGNOSIS — N939 Abnormal uterine and vaginal bleeding, unspecified: Secondary | ICD-10-CM

## 2023-04-20 NOTE — Progress Notes (Signed)
Gynecologic Oncology Telehealth Note: Gyn-Onc  I connected with Zoe Cobb on 04/20/23 at  4:50 PM EDT by telephone and verified that I am speaking with the correct person using two identifiers.  I discussed the limitations, risks, security and privacy concerns of performing an evaluation and management service by telemedicine and the availability of in-person appointments. I also discussed with the patient that there may be a patient responsible charge related to this service. The patient expressed understanding and agreed to proceed.  Other persons participating in the visit and their role in the encounter: none.  Patient's location: home Provider's location: [facility]  Reason for Visit: follow-up  Treatment History: The patient was admitted in 10/2021 for anemia in the setting of vaginal bleeding with plan for outpatient follow-up within a week, which appears did not happen.  Patient was started on 40 mg of Megace twice daily for a total of 7 days.   In January, the patient was seen as a new patient for menopausal bleeding for more than a year.  This was described as intermittent without associated pain or cramping.  Plan at that time was to proceed with EUA and D&C with ultrasound to assess endometrial lining.  Also discussed suppression with progesterone containing IUD.   The patient has had multiple admissions since for vaginal and/or rectal bleeding.  She was admitted in February for bleeding in the setting of an INR elevated at 3.5.  The patient was admitted in April with vaginal and rectal bleeding found to have a supratherapeutic INR in the setting of being on Coumadin for A-fib.  She received vitamin K.  She was treated for diverticulitis at that time.  She was briefly admitted again in July after she presented with recurrent vaginal bleeding with an episode lasting for 3 days.  Her hemoglobin was noted to be 7.7.  This is in the setting of being on Coumadin for A-fib with an INR of 3.6.   Her Coumadin was held while he received vitamin K until INR decreased.  She was also given 2 units of packed red blood cells.   Pelvic ultrasound exam was performed on 03/01/2023 and this revealed a uterus measuring 5.7 x 7 x 13.4 cm with a 4 x 5 x 5.2 lesion measured in the uterus corresponding to a fundal leiomyoma better seen on prior CT scan.  Endometrium with markedly limited evaluation, measures up to 12 mm.  Neither ovary seen.   The patient was taken for hysteroscopy with endometrial sampling on 03/12/2023.  Findings at surgery included normal-appearing cervix with abundant friable tissue within a dilated cervical os.  Hysteroscopy was abandoned secondary to abundant tissue limiting view.  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   The patient held her Coumadin prior to her recent procedure, restarted it on 8/29.  She endorses beginning to have vaginal bleeding and pelvic pain on 8/30 with passage of large blood clots.  This is large-volume vaginal bleeding.  On presentation to the emergency department, mildly hypotensive (blood pressures normally run low), heart rate in the 90s.  Hemoglobin 8.3 with an INR of 2.3.  Patient was admitted to the stepdown unit and transfused 1 unit of packed red blood cells.  She was also given Kcentra to reverse her INR.  The setting of hypotension, CT scan was obtained to rule out source of infection.  This was notable for a blood clot in the cervix versus upper vagina.  She was started on Rocephin and doxycycline for possible endometritis.  The patient was ultimately discharged on 9/4.  Interval History: Patient notes overall doing well.  She denies any vaginal bleeding or cramping since leaving the hospital.  She saw her cardiologist earlier this week and it sounds like was  restarted on Coumadin.  I do not see a note from this visit but I do see that there was discussion about potentially starting her on Eliquis instead.  She denies any bleeding since restarting blood thinner.  Past Medical/Surgical History: Past Medical History:  Diagnosis Date   Arthritis    CHF (congestive heart failure) (HCC)    Diabetes mellitus without complication (HCC)    metformin   Dysrhythmia    A-fib   Hypertension    Morbid obesity (HCC)    Multiple sclerosis (HCC)    asymptomatic   Paroxysmal atrial fibrillation (HCC)     Past Surgical History:  Procedure Laterality Date   COLONOSCOPY WITH PROPOFOL N/A 12/15/2020   Procedure: COLONOSCOPY WITH PROPOFOL;  Surgeon: Meryl Dare, MD;  Location: [facility] ENDOSCOPY;  Service: Endoscopy;  Laterality: N/A;   DILATATION & CURETTAGE/HYSTEROSCOPY WITH MYOSURE N/A 04/04/2023   Procedure: DILATATION & CURETTAGE/HYSTEROSCOPY WITH MYOSURE;  Surgeon: Carver Fila, MD;  Location: [facility] ORS;  Service: Gynecology;  Laterality: N/A;   HYSTEROSCOPY WITH D & C N/A 03/12/2023   Procedure: DIAGNOSTIC HYSTEROSCOPY, ENDOMETRIAL BIOPSY;  Surgeon: Lorriane Shire, MD;  Location: [facility] ORS;  Service: Gynecology;  Laterality: N/A;   INTRAUTERINE DEVICE (IUD) INSERTION N/A 04/04/2023   Procedure: INTRAUTERINE DEVICE (IUD) INSERTION MIRENA, TILT TEST;  Surgeon: Carver Fila, MD;  Location: [facility] ORS;  Service: Gynecology;  Laterality: N/A;    Family History  Problem Relation Age of Onset   Hypertension Mother    Diabetes Mother    Stomach cancer Father    Kidney disease Sister    Diabetes Sister    Diabetes Sister    Diabetes Sister    Heart attack Brother    HIV/AIDS Brother    Diabetes Brother    Prostate cancer Brother    Colon cancer Neg Hx    Breast cancer Neg Hx    Ovarian cancer Neg Hx    Endometrial cancer Neg Hx    Pancreatic cancer Neg Hx     Social History   Socioeconomic History   Marital status: Married    Spouse name:  Not on file   Number of children: 2   Years of education: Not on file   Highest education level: Not on file  Occupational History   Not on file  Tobacco Use   Smoking status: Former    Current packs/day: 0.00    Average packs/day: 1 pack/day for 9.0 years (9.0 ttl pk-yrs)    Types: Cigarettes    Start date: 22    Quit date: 1996    Years since quitting: 28.7   Smokeless tobacco: Never  Vaping Use   Vaping status: Never Used  Substance and Sexual Activity   Alcohol use: No    Alcohol/week: 0.0 standard drinks of alcohol   Drug use: No   Sexual activity: Not on file  Other Topics Concern   Not on file  Social History Narrative   Pt lives by herself, she completed hs.    Social Determinants of Health   Financial Resource Strain: Not on file  Food Insecurity:  No Food Insecurity (04/08/2023)   Hunger Vital Sign    Worried About Running Out of Food in the Last Year: Never true    Ran Out of Food in the Last Year: Never true  Transportation Needs: No Transportation Needs (04/08/2023)   PRAPARE - Administrator, Civil Service (Medical): No    Lack of Transportation (Non-Medical): No  Physical Activity: Not on file  Stress: Not on file  Social Connections: Not on file    Current Medications:  Current Outpatient Medications:    acetaminophen (TYLENOL) 325 MG tablet, Take 650 mg by mouth every 6 (six) hours as needed for moderate pain., Disp: , Rfl:    cyanocobalamin (VITAMIN B12) 500 MCG tablet, Take 1 tablet (500 mcg total) by mouth daily., Disp: 30 tablet, Rfl: 1   ferrous sulfate 325 (65 FE) MG EC tablet, Take 1 tablet (325 mg total) by mouth 2 (two) times daily. (Patient taking differently: Take 325 mg by mouth daily with breakfast.), Disp: 60 tablet, Rfl: 3   isosorbide mononitrate (IMDUR) 60 MG 24 hr tablet, Take 60 mg by mouth daily., Disp: , Rfl:    metFORMIN (GLUCOPHAGE) 1000 MG tablet, Take 1,000 mg by mouth 2 (two) times daily., Disp: , Rfl:    metoprolol  succinate (TOPROL-XL) 50 MG 24 hr tablet, Take 50 mg by mouth every evening., Disp: , Rfl:    Multiple Vitamins-Minerals (ONE A DAY WOMEN 50 PLUS PO), Take 1 tablet by mouth daily., Disp: , Rfl:    torsemide (DEMADEX) 20 MG tablet, Take 20 mg by mouth 2 (two) times daily., Disp: , Rfl:    traMADol (ULTRAM) 50 MG tablet, Take 50 mg by mouth every 8 (eight) hours as needed for moderate pain., Disp: , Rfl:   Review of Symptoms: Pertinent positives as per HPI.  Physical Exam: Deferred given limitations of phone visit.  Laboratory & Radiologic Studies: 04/04/23 A. ENDOMETRIAL, CURETTINGS:  Well-differentiated endometrioid adenocarcinoma with squamous similar  ruler metaplasia, FIGO 1  Background complex atypical hyperplasia (EIN)   Assessment & Plan: Zoe Cobb is a 67 y.o. 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  Recently admitted with heavy bleeding in the postoperative period after restarting Coumadin.  Patient is doing very well, denies any bleeding or pelvic pain since being discharged from the hospital even since restarting her blood thinner.  She is scheduled for an MRI to assess myometrial invasion and metastatic disease next week.  I will call her with these results.  She is tentatively scheduled for follow-up in 3 months.  I discussed the assessment and treatment plan with the patient. The patient was provided with an opportunity to ask questions and all were answered. The patient agreed with the plan and demonstrated an understanding of the instructions.   The patient was advised to call back or see an in-person evaluation if the symptoms worsen or if the condition fails to improve as anticipated.   8 minutes of total time was spent for this patient encounter, including preparation, phone counseling with the patient and coordination of care, and documentation of the encounter.   Eugene Garnet, MD  Division of Gynecologic Oncology   Department of Obstetrics and Gynecology  [facility]

## 2023-04-22 NOTE — Progress Notes (Signed)
Description   04/19/2023    INR today was 1.3. Patient goal level is (2.0-2.5)  Prior dose was 4mg  on Thurs and 2.5mg  all other days.  Patient will continue with 4mg  on Thurs and 2.5mg  all other days.   Pt will recheck INR 2 weeks on 09/26.   Lab Results      Component                Value               Date                      INR                      1.2                 04/09/2023                INR                      1.2                 04/08/2023                INR                      1.2                 04/07/2023                        Paroxysmal atrial fibrillation  (primary encounter diagnosis) Plan: POCT INR  Patient has had life-threatening GU bleed, hence has been off of anticoagulation, will gradually increase her INR, previously her INR was therapeutic at the present dose hence no change was done today.

## 2023-04-23 ENCOUNTER — Telehealth: Payer: Self-pay

## 2023-04-23 NOTE — Telephone Encounter (Signed)
Pt left a voicemail on office line stating she is needing to cancel her MRI for tomorrow due to the weather.   I called pt back and gave her the scheduling number so she can reschedule the MRI according to her schedule. She voiced an understanding.

## 2023-04-24 ENCOUNTER — Ambulatory Visit (HOSPITAL_COMMUNITY): Payer: 59

## 2023-04-26 NOTE — Telephone Encounter (Signed)
Patient spoke with someone already and is back in range.

## 2023-05-03 ENCOUNTER — Encounter (HOSPITAL_COMMUNITY): Payer: Self-pay

## 2023-05-03 ENCOUNTER — Ambulatory Visit (HOSPITAL_COMMUNITY)
Admission: RE | Admit: 2023-05-03 | Discharge: 2023-05-03 | Disposition: A | Discharge status: Home / Self Care | Payer: 59 | Source: Ambulatory Visit | Attending: Gynecologic Oncology | Admitting: Gynecologic Oncology

## 2023-05-03 DIAGNOSIS — C541 Malignant neoplasm of endometrium: Secondary | ICD-10-CM

## 2023-05-08 ENCOUNTER — Telehealth: Payer: Self-pay | Admitting: Oncology

## 2023-05-08 NOTE — Telephone Encounter (Signed)
Called Zoe Cobb regarding rescheduling her MRI at [facility].  Brinsley said she does not want to go through that again. She could not get comfortable on the MRI table and they did not have the right type of Hoyer lift for her pad.  She needed a 4 prong and they only had a 3 prong.  She was very uncomfortable and said "she didn't go home the same as she came in." Also talked with her about using an abdominal binder and she said she uses one for her CT scans.  She does not want to try it for an MRI.  Let her know that I will notify Dr. Pricilla Holm.

## 2023-05-09 ENCOUNTER — Other Ambulatory Visit: Payer: Self-pay | Admitting: Cardiology

## 2023-05-09 DIAGNOSIS — I480 Paroxysmal atrial fibrillation: Secondary | ICD-10-CM

## 2023-05-09 NOTE — Telephone Encounter (Signed)
Pt from Dr Verl Dicker office and now INR/Warfarin will be monitored at [facility] by our Coumadin Clinic. Schedule New Coumadin appt on Tuesday, 05/15/23 at 10:30am. Provided pt with Coumadin Clinic address. Pt verbalized understanding. Will wait to refill Warfarin after initial visit.

## 2023-05-15 ENCOUNTER — Other Ambulatory Visit: Payer: Self-pay

## 2023-05-15 ENCOUNTER — Ambulatory Visit: Payer: 59 | Attending: Cardiovascular Disease

## 2023-05-15 DIAGNOSIS — Z5181 Encounter for therapeutic drug level monitoring: Secondary | ICD-10-CM

## 2023-05-15 DIAGNOSIS — I480 Paroxysmal atrial fibrillation: Secondary | ICD-10-CM

## 2023-05-15 DIAGNOSIS — Z7901 Long term (current) use of anticoagulants: Secondary | ICD-10-CM

## 2023-05-15 LAB — POCT INR: INR: 4.6 — AB (ref 2.0–3.0)

## 2023-05-15 MED ORDER — WARFARIN SODIUM 5 MG PO TABS: 5.0000 mg | ORAL_TABLET | Freq: Every day | ORAL | 0 refills | Status: DC

## 2023-05-15 NOTE — Patient Instructions (Addendum)
HOLD TODAY AND WEDNESDAY THEN continue 2.5 mg Daily, except 5 mg on Thursday.  INR in 2 weeks.  Stay consistent with Zoe Cobb  A full discussion of the nature of anticoagulants has been carried out.  A benefit risk analysis has been presented to the patient, so that they understand the justification for choosing anticoagulation at this time. The need for frequent and regular monitoring, precise dosage adjustment and compliance is stressed.  Side effects of potential bleeding are discussed.  The patient should avoid any OTC items containing aspirin or ibuprofen, and should avoid great swings in general diet.  Avoid alcohol consumption.  Call if any signs of abnormal bleeding.  [phone]

## 2023-05-29 ENCOUNTER — Ambulatory Visit: Payer: 59 | Attending: Cardiology

## 2023-05-29 DIAGNOSIS — Z7901 Long term (current) use of anticoagulants: Secondary | ICD-10-CM | POA: Diagnosis not present

## 2023-05-29 DIAGNOSIS — I480 Paroxysmal atrial fibrillation: Secondary | ICD-10-CM

## 2023-05-29 LAB — POCT INR: INR: 2.1 (ref 2.0–3.0)

## 2023-05-29 NOTE — Patient Instructions (Signed)
continue 2.5 mg Daily, except 5 mg on Thursday.  INR in 4 weeks.  [phone]  [phone]

## 2023-06-18 IMAGING — CT CT CTA ABD/PEL W/CM AND/OR W/O CM
3 of 13 series · 11 of 46 positions shown, 17 images · IV contrast (APPLIED)
Comparison: None.

CLINICAL DATA: Lower GI bleed.

Continuous bloody diarrhea for 3 days.
EXAM:
CTA ABDOMEN AND PELVIS WITHOUT AND WITH CONTRAST
TECHNIQUE: Multidetector CT imaging of the abdomen and pelvis was performed
using the standard protocol during bolus administration of
intravenous contrast. Multiplanar reconstructed images and MIPs were
obtained and reviewed to evaluate the vascular anatomy.

[Series 4: abd/ pelvis 5.0 i30f 2 · axial · 0.98mm/px · z∈[+936,+1196]mm · 5 of 93 slices shown]
[im 14/93  soft-tissue]
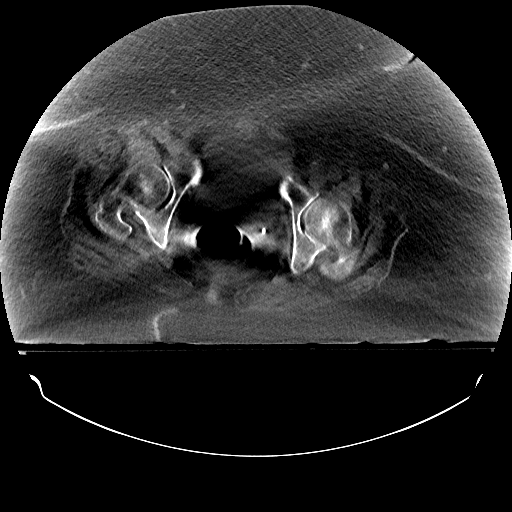
[im 27/93  soft-tissue]
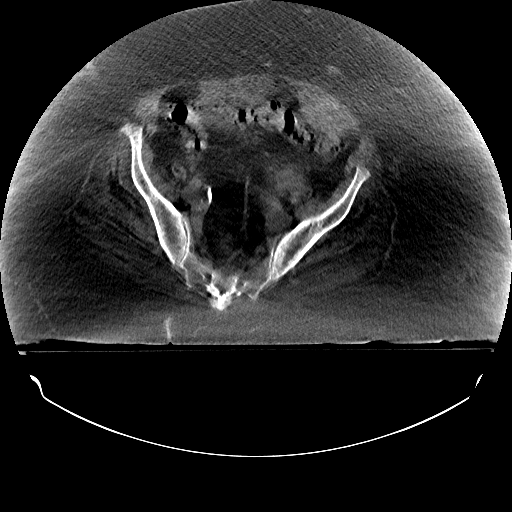
[im 40/93  soft-tissue]
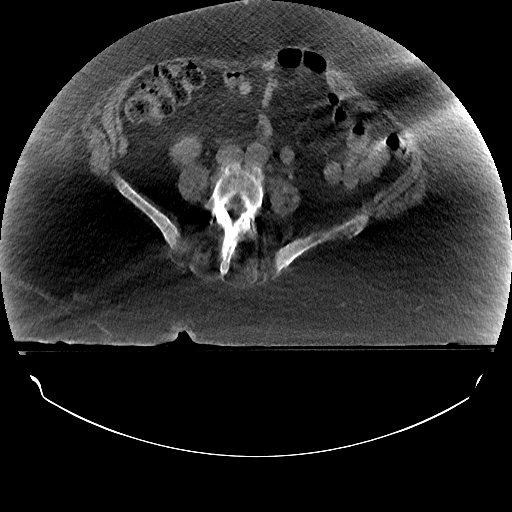
[im 53/93  soft-tissue]
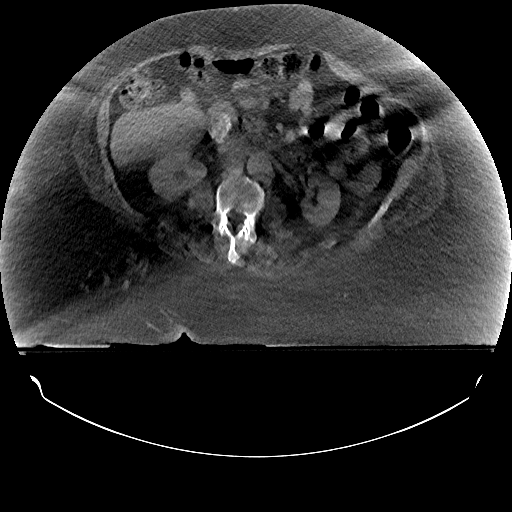
[im 66/93  soft-tissue]
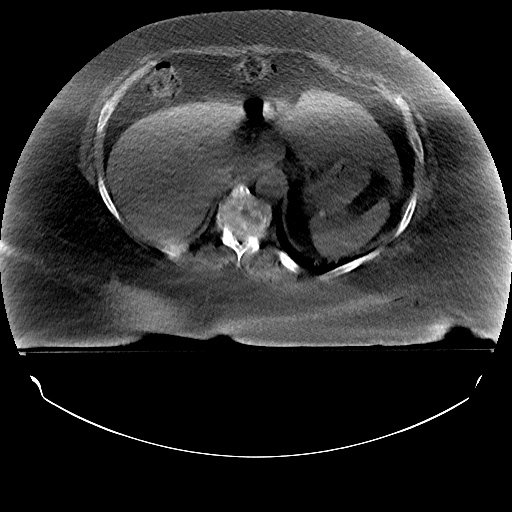

[Series 13: coronals · coronal · 0.69mm/px · 1 of 189 slices shown, 2 images]
[im 95/189  soft-tissue]
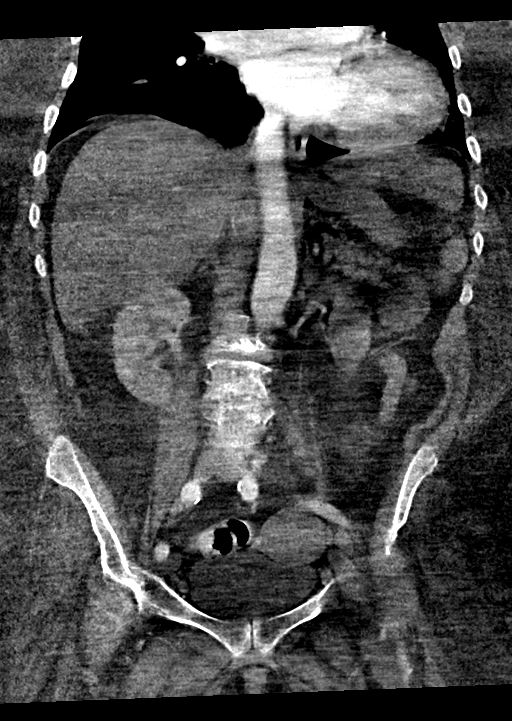
[im 95/189  bone]
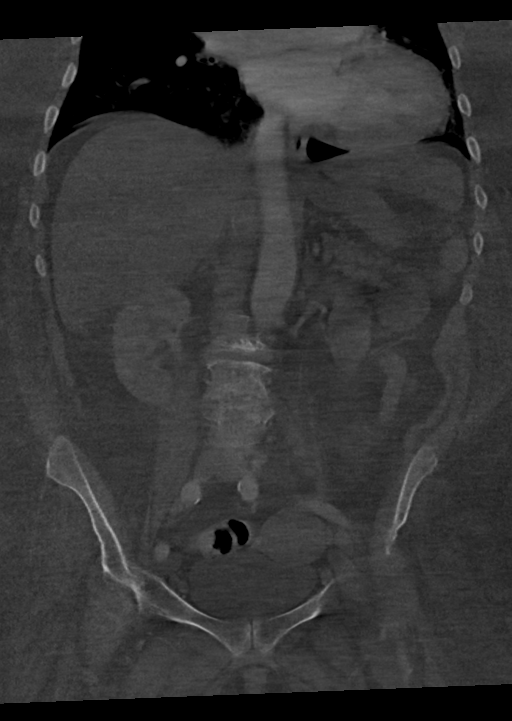

[Series 17: venous 5.0 i30f 1 · axial · portal-venous · 0.72mm/px · z∈[+948,+1248]mm · 5 of 92 slices shown, 10 images]
[im 16/92  soft-tissue]
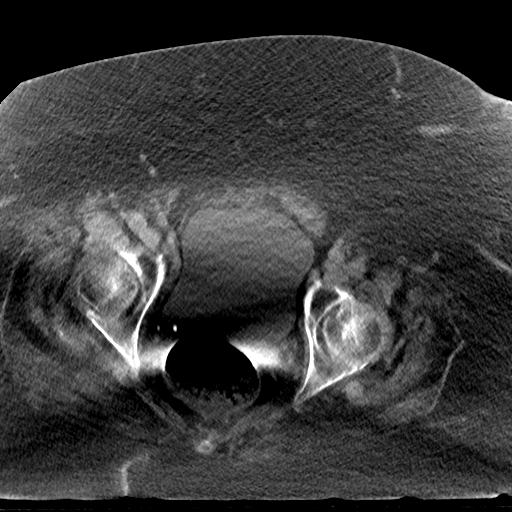
[im 16/92  bone]
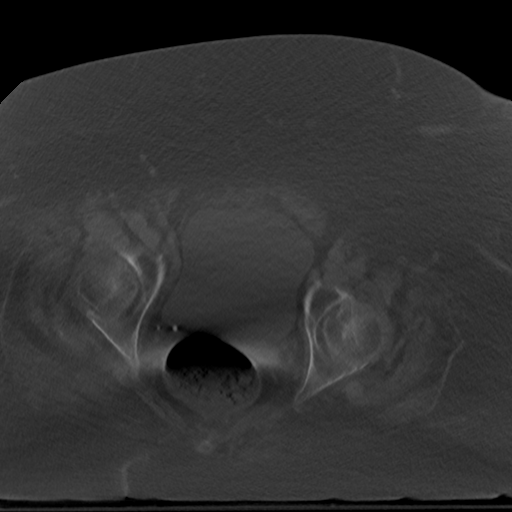
[im 31/92  soft-tissue]
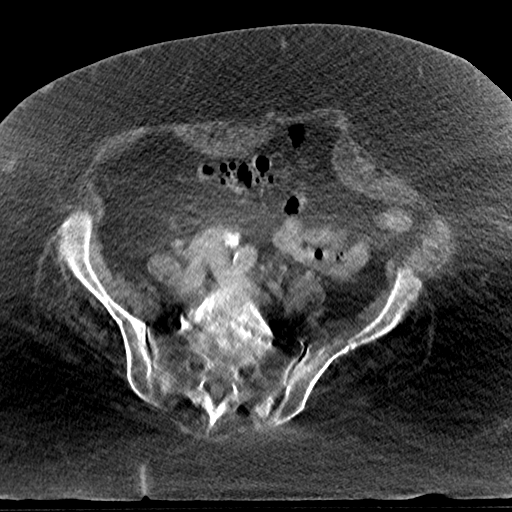
[im 31/92  lung]
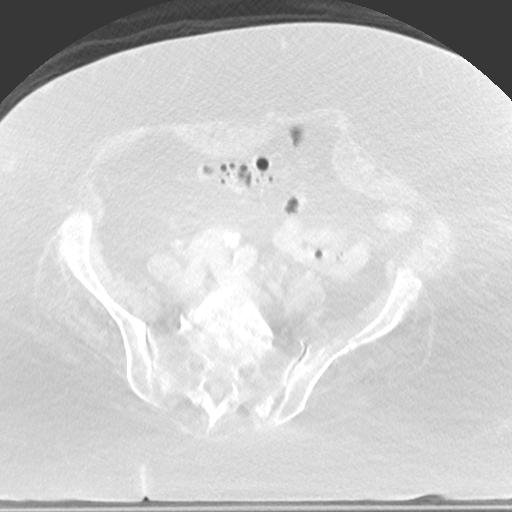
[im 46/92  soft-tissue]
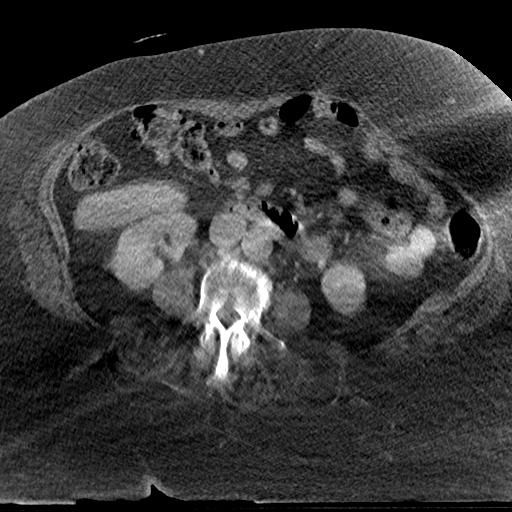
[im 46/92  lung]
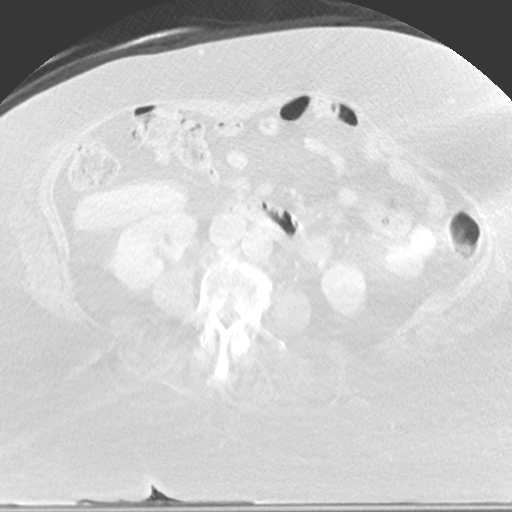
[im 61/92  soft-tissue]
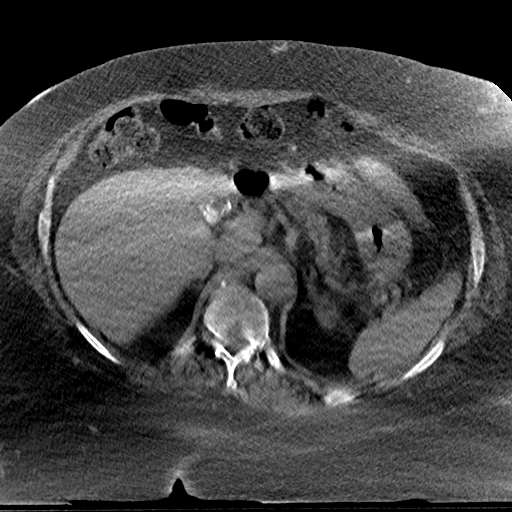
[im 61/92  lung]
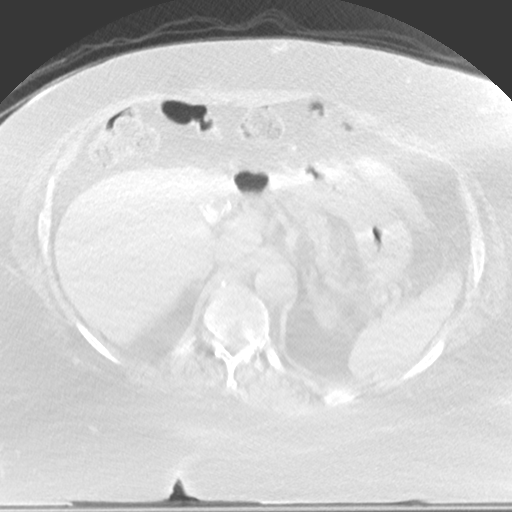
[im 76/92  soft-tissue]
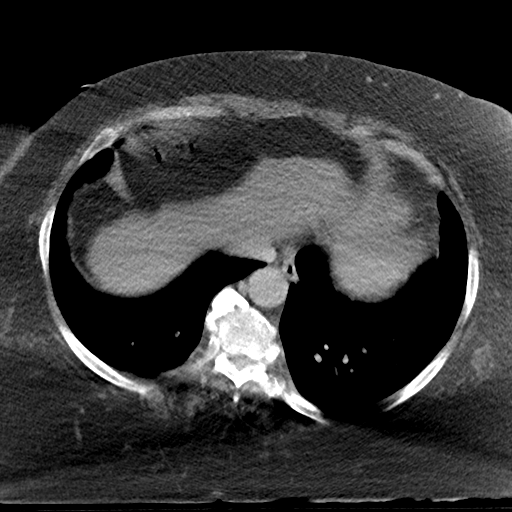
[im 76/92  lung]
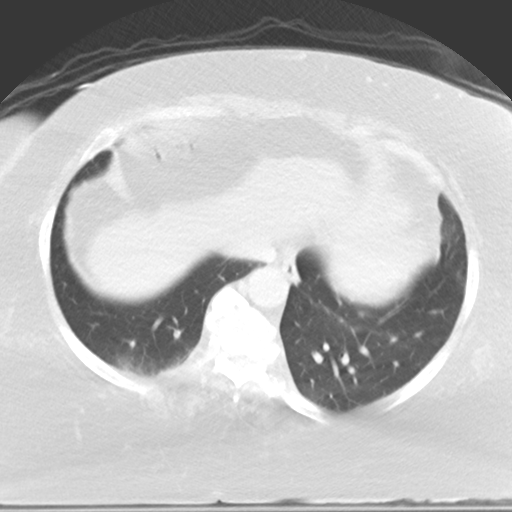

[11 of 46 positions shown; findings below may reference images not displayed]

RADIATION DOSE REDUCTION: This exam was performed according to the
departmental dose-optimization program which includes automated
exposure control, adjustment of the mA and/or kV according to
patient size and/or use of iterative reconstruction technique.

CONTRAST:  100mL OMNIPAQUE IOHEXOL 350 MG/ML SOLN
FINDINGS: VASCULAR

Aorta: Normal caliber aorta without aneurysm, dissection, vasculitis
or significant stenosis.

Celiac: No signal abnormality.

SMA: No significant abnormality.

Renals: Not well evaluated due to suboptimal opacification.

IMA: Patent without evidence of aneurysm, dissection, vasculitis or
significant stenosis.

Inflow: Patent without evidence of aneurysm, dissection, vasculitis
or significant stenosis.

Proximal Outflow: Bilateral common femoral and visualized portions
of the superficial and profunda femoral arteries are patent without
evidence of aneurysm, dissection, vasculitis or significant
stenosis.

Veins: No obvious venous abnormality is identified. Evaluation is
limited due to suboptimal opacification, even on the portal venous
phase.

Review of the MIP images confirms the above findings.

NON-VASCULAR

Lower chest: No acute abnormality.

Hepatobiliary: Cholelithiasis. No significant abnormality of the
liver or bile ducts.

Pancreas: Unremarkable. No pancreatic ductal dilatation or
surrounding inflammatory changes.

Spleen: Normal in size without focal abnormality.

Adrenals/Urinary Tract: Adrenal glands are unremarkable. 2.5 cm
simple cyst present in the upper pole of the right kidney. 1.3 cm
simple cyst seen in the mid right kidney. 1.3 cm simple left renal
cyst.

Kidneys, ureters, and bladder are otherwise unremarkable.

Stomach/Bowel: No bowel dilatation to indicate ileus or obstruction.
Evaluation for acute hemorrhage is significantly limited due to
streak artifact created from patient's body habitus. Sigmoid colon
diverticulosis without evidence of acute diverticulitis.

Lymphatic: Mildly prominent he left inguinal lymph node is seen
measuring 1.2 cm in short axis, is most likely inflammatory.
Otherwise no enlarged abdominal or pelvic lymph nodes.

Reproductive: Uterus and bilateral adnexa are unremarkable.

Other: Mild diastasis of the rectus sheath.

Musculoskeletal: Grade 1-2 anterolisthesis of L5 on S1. no acute
osseous abnormality identified.
IMPRESSION: VASCULAR

No active gastrointestinal hemorrhage identified. However, please
note that this exam is limited for hemorrhage due to extensive
streak artifact created by patient's body habitus.

NON-VASCULAR

Sigmoid colon diverticulosis without evidence of acute
diverticulitis.

## 2023-06-22 ENCOUNTER — Telehealth: Payer: Self-pay

## 2023-06-22 NOTE — Telephone Encounter (Signed)
Message left for patient to call back regarding rescheduling her post op visit for a morning appointment. Per Dr. Pricilla Holm, a morning appointment is ok to schedule. First available is January 9, @ 8:30.

## 2023-06-22 NOTE — Telephone Encounter (Signed)
Pt called office stating she needs to reschedule her appointment with Dr.Tucker on 06/29/23. She states a morning appointment would be better for her for transportation purposes. In reviewing the schedule out a few weeks, I explained there are not any available appointments in the near future. She said she would have to cancel and maybe be seen in January.  Aware I will send message to Dr. Pricilla Holm and will call her back.

## 2023-06-22 NOTE — Telephone Encounter (Signed)
Please schedule her in the next available am appointment. Can we come between 8-9?

## 2023-06-25 ENCOUNTER — Telehealth: Payer: Self-pay

## 2023-06-25 NOTE — Telephone Encounter (Signed)
Voicemail left for patient to call office regarding her request to reschedule upcoming appointment.

## 2023-06-25 NOTE — Telephone Encounter (Signed)
Pt returned call. Agreed to rescheduled appointment of 08/16/23 @ 8:30

## 2023-06-26 ENCOUNTER — Ambulatory Visit: Payer: 59

## 2023-06-29 ENCOUNTER — Encounter: Payer: 59 | Admitting: Gynecologic Oncology

## 2023-07-02 ENCOUNTER — Ambulatory Visit: Payer: 59 | Attending: Cardiology

## 2023-07-02 DIAGNOSIS — I480 Paroxysmal atrial fibrillation: Secondary | ICD-10-CM | POA: Diagnosis not present

## 2023-07-02 DIAGNOSIS — Z7901 Long term (current) use of anticoagulants: Secondary | ICD-10-CM | POA: Diagnosis not present

## 2023-07-02 LAB — POCT INR: INR: 2.2 (ref 2.0–3.0)

## 2023-07-02 NOTE — Patient Instructions (Signed)
continue 2.5 mg Daily, except 5 mg on Thursday.  INR in 6 weeks.  [phone]  [phone]

## 2023-08-13 ENCOUNTER — Ambulatory Visit: Payer: 59

## 2023-08-16 ENCOUNTER — Inpatient Hospital Stay: Payer: 59 | Attending: Gynecologic Oncology | Admitting: Gynecologic Oncology

## 2023-08-16 ENCOUNTER — Encounter: Payer: Self-pay | Admitting: Gynecologic Oncology

## 2023-08-16 VITALS — BP 103/73 | HR 74 | Temp 98.5°F | Resp 18 | Wt >= 6400 oz

## 2023-08-16 DIAGNOSIS — Z6841 Body Mass Index (BMI) 40.0 and over, adult: Secondary | ICD-10-CM | POA: Diagnosis not present

## 2023-08-16 DIAGNOSIS — I480 Paroxysmal atrial fibrillation: Secondary | ICD-10-CM | POA: Diagnosis not present

## 2023-08-16 DIAGNOSIS — Z975 Presence of (intrauterine) contraceptive device: Secondary | ICD-10-CM | POA: Diagnosis not present

## 2023-08-16 DIAGNOSIS — Z79890 Hormone replacement therapy (postmenopausal): Secondary | ICD-10-CM | POA: Diagnosis not present

## 2023-08-16 DIAGNOSIS — Z7901 Long term (current) use of anticoagulants: Secondary | ICD-10-CM | POA: Diagnosis not present

## 2023-08-16 DIAGNOSIS — C541 Malignant neoplasm of endometrium: Secondary | ICD-10-CM | POA: Diagnosis present

## 2023-08-16 NOTE — Progress Notes (Signed)
 Gynecologic Oncology Return Clinic Visit  08/16/23  Reason for Visit: surveillance  Treatment History: The patient was admitted in 10/2021 for anemia in the setting of vaginal bleeding with plan for outpatient follow-up within a week, which appears did not happen.  Patient was started on 40 mg of Megace  twice daily for a total of 7 days.   In January, the patient was seen as a new patient for menopausal bleeding for more than a year.  This was described as intermittent without associated pain or cramping.  Plan at that time was to proceed with EUA and D&C with ultrasound to assess endometrial lining.  Also discussed suppression with progesterone containing IUD.   The patient has had multiple admissions since for vaginal and/or rectal bleeding.  She was admitted in February for bleeding in the setting of an INR elevated at 3.5.  The patient was admitted in April with vaginal and rectal bleeding found to have a supratherapeutic INR in the setting of being on Coumadin  for A-fib.  She received vitamin K .  She was treated for diverticulitis at that time.  She was briefly admitted again in July after she presented with recurrent vaginal bleeding with an episode lasting for 3 days.  Her hemoglobin was noted to be 7.7.  This is in the setting of being on Coumadin  for A-fib with an INR of 3.6.  Her Coumadin  was held while he received vitamin K  until INR decreased.  She was also given 2 units of packed red blood cells.   Pelvic ultrasound exam was performed on 03/01/2023 and this revealed a uterus measuring 5.7 x 7 x 13.4 cm with a 4 x 5 x 5.2 lesion measured in the uterus corresponding to a fundal leiomyoma better seen on prior CT scan.  Endometrium with markedly limited evaluation, measures up to 12 mm.  Neither ovary seen.   The patient was taken for hysteroscopy with endometrial sampling on 03/12/2023.  Findings at surgery included normal-appearing cervix with abundant friable tissue within a dilated cervical  os.  Hysteroscopy was abandoned secondary to abundant tissue limiting view.  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   The patient held her Coumadin  prior to her recent procedure, restarted it on 8/29.  She endorses beginning to have vaginal bleeding and pelvic pain on 8/30 with passage of large blood clots.  This is large-volume vaginal bleeding.  On presentation to the emergency department, mildly hypotensive (blood pressures normally run low), heart rate in the 90s.  Hemoglobin 8.3 with an INR of 2.3.  Patient was admitted to the stepdown unit and transfused 1 unit of packed red blood cells.  She was also given Kcentra  to reverse her INR.  The setting of hypotension, CT scan was obtained to rule out source of infection.  This was notable for a blood clot in the cervix versus upper vagina.  She was started on Rocephin  and doxycycline  for possible endometritis.  Interval History: Doing well.  Denies any vaginal bleeding in the last 4 months.  Denies any pelvic pain or cramping.  Reports baseline bowel bladder function.  Denies any recent weight changes.  Endorses good appetite.  Past Medical/Surgical History: Past Medical History:  Diagnosis Date   Arthritis    CHF (congestive heart failure) (HCC)    Diabetes mellitus without complication (HCC)    metformin  Dysrhythmia    A-fib   Hypertension    Morbid obesity (HCC)    Multiple sclerosis (HCC)    asymptomatic   Paroxysmal atrial fibrillation [facility]     Past Surgical History:  Procedure Laterality Date   COLONOSCOPY WITH PROPOFOL  N/A 12/15/2020   Procedure: COLONOSCOPY WITH PROPOFOL ;  Surgeon: Aneita Gwendlyn DASEN, MD;  Location: [facility] ENDOSCOPY;  Service: Endoscopy;  Laterality: N/A;   DILATATION & CURETTAGE/HYSTEROSCOPY WITH MYOSURE N/A 04/04/2023    Procedure: DILATATION & CURETTAGE/HYSTEROSCOPY WITH MYOSURE;  Surgeon: Viktoria Comer SAUNDERS, MD;  Location: [facility] ORS;  Service: Gynecology;  Laterality: N/A;   HYSTEROSCOPY WITH D & C N/A 03/12/2023   Procedure: DIAGNOSTIC HYSTEROSCOPY, ENDOMETRIAL BIOPSY;  Surgeon: Jeralyn Crutch, MD;  Location: [facility] ORS;  Service: Gynecology;  Laterality: N/A;   INTRAUTERINE DEVICE (IUD) INSERTION N/A 04/04/2023   Procedure: INTRAUTERINE DEVICE (IUD) INSERTION MIRENA , TILT TEST;  Surgeon: Viktoria Comer SAUNDERS, MD;  Location: [facility] ORS;  Service: Gynecology;  Laterality: N/A;    Family History  Problem Relation Age of Onset   Hypertension Mother    Diabetes Mother    Stomach cancer Father    Kidney disease Sister    Diabetes Sister    Diabetes Sister    Diabetes Sister    Heart attack Brother    HIV/AIDS Brother    Diabetes Brother    Prostate cancer Brother    Colon cancer Neg Hx    Breast cancer Neg Hx    Ovarian cancer Neg Hx    Endometrial cancer Neg Hx    Pancreatic cancer Neg Hx     Social History   Socioeconomic History   Marital status: Married    Spouse name: Not on file   Number of children: 2   Years of education: Not on file   Highest education level: Not on file  Occupational History   Not on file  Tobacco Use   Smoking status: Former    Current packs/day: 0.00    Average packs/day: 1 pack/day for 9.0 years (9.0 ttl pk-yrs)    Types: Cigarettes    Start date: 73    Quit date: 1996    Years since quitting: 29.0   Smokeless tobacco: Never  Vaping Use   Vaping status: Never Used  Substance and Sexual Activity   Alcohol use: No    Alcohol/week: 0.0 standard drinks of alcohol   Drug use: No   Sexual activity: Not on file  Other Topics Concern   Not on file  Social History Narrative   Pt lives by herself, she completed hs.    Social Drivers of Corporate Investment Banker Strain: Not on file  Food Insecurity: No Food Insecurity (04/08/2023)   Hunger Vital Sign    Worried  About Running Out of Food in the Last Year: Never true    Ran Out of Food in the Last Year: Never true  Transportation Needs: No Transportation Needs (04/08/2023)   PRAPARE - Administrator, Civil Service (Medical): No    Lack of Transportation (Non-Medical): No  Physical Activity: Not on file  Stress: Not on file  Social Connections: Not on file    Current Medications:  Current Outpatient Medications:    acetaminophen  (TYLENOL ) 500 MG tablet, Take 500 mg by mouth every 6 (six) hours as needed., Disp: , Rfl:    metoprolol  tartrate (LOPRESSOR ) 50 MG tablet, Take 50 mg by mouth 2 (two) times daily., Disp: , Rfl:  vitamin B-12 (CYANOCOBALAMIN ) 500 MCG tablet, Take 500 mcg by mouth daily., Disp: , Rfl:   Review of Systems: Denies appetite changes, fevers, chills, fatigue, unexplained weight changes. Denies hearing loss, neck lumps or masses, mouth sores, ringing in ears or voice changes. Denies cough or wheezing.  Denies shortness of breath. Denies chest pain or palpitations. Denies leg swelling. Denies abdominal distention, pain, blood in stools, constipation, diarrhea, nausea, vomiting, or early satiety. Denies pain with intercourse, dysuria, frequency, hematuria or incontinence. Denies hot flashes, pelvic pain, vaginal bleeding or vaginal discharge.   Denies joint pain, back pain or muscle pain/cramps. Denies itching, rash, or wounds. Denies dizziness, headaches, numbness or seizures. Denies swollen lymph nodes or glands, denies easy bruising or bleeding. Denies anxiety, depression, confusion, or decreased concentration.  Physical Exam: BP 103/73 (BP Location: Right Wrist, Patient Position: Sitting)   Pulse 74   Temp 98.5 F (36.9 C) (Oral)   Resp 18   Wt (!) 413 lb (187.3 kg)   SpO2 100%   BMI 78.04 kg/m  General: Alert, oriented, no acute distress. HEENT: Normocephalic, atraumatic, sclera anicteric. Chest: Clear to auscultation bilaterally.  No wheezes or  rhonchi. Cardiovascular: Regular rate and rhythm, no murmurs.  Laboratory & Radiologic Studies: None new  Assessment & Plan: Zoe Cobb is a 68 y.o. 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   IUD placed at the time of D&C on 04/04/23. MMRp, p53 WT.   Patient is overall doing very well.  Denies any symptoms and has had no vaginal bleeding since shortly after her admission postoperatively for bleeding in early September 2024.  Unfortunately, notes on her chart for planning purposes, we do not have the lift needed to move the patient for an exam and biopsy today.  Plan to reschedule her in the next 1-1.5 months to return for an endometrial biopsy.  If this is unsuccessful or she is unable to tolerate it, we will plan for EUA and biopsy in the OR.  Plan to have her continue Coumadin  for endometrial biopsy in clinic.    18 minutes of total time was spent for this patient encounter, including preparation, face-to-face counseling with the patient and coordination of care, and documentation of the encounter.  Comer Dollar, MD  Division of Gynecologic Oncology  Department of Obstetrics and Gynecology  [facility]

## 2023-08-24 ENCOUNTER — Ambulatory Visit: Payer: 59 | Attending: Cardiovascular Disease

## 2023-08-24 DIAGNOSIS — I480 Paroxysmal atrial fibrillation: Secondary | ICD-10-CM

## 2023-08-24 DIAGNOSIS — Z7901 Long term (current) use of anticoagulants: Secondary | ICD-10-CM

## 2023-08-24 LAB — POCT INR: INR: 1.9 — AB (ref 2.0–3.0)

## 2023-08-24 NOTE — Patient Instructions (Signed)
Take 1.5 tablets today only then continue 2.5 mg Daily, except 5 mg on Thursday.  INR in 6 weeks.  [phone]  [phone]

## 2023-08-31 ENCOUNTER — Telehealth: Payer: Self-pay | Admitting: *Deleted

## 2023-08-31 NOTE — Telephone Encounter (Signed)
Attempted to reach patient to ask about her sling/pad and hoyer lift for her upcoming appt. In February. Left voicemail requesting call back.

## 2023-09-04 NOTE — Telephone Encounter (Signed)
Spoke with Zoe Cobb yesterday and she states her sling pad has loops and will work on the [employer]. Advised patient that the office is going to see if the [employer] will work in our exam room and if we have concern that it will not the office will reach back out before her appt. On 2/13. Pt thanked the office for calling.

## 2023-09-08 ENCOUNTER — Emergency Department (HOSPITAL_COMMUNITY): Payer: 59

## 2023-09-08 ENCOUNTER — Other Ambulatory Visit: Payer: Self-pay

## 2023-09-08 ENCOUNTER — Encounter (HOSPITAL_COMMUNITY): Payer: Self-pay

## 2023-09-08 ENCOUNTER — Emergency Department (HOSPITAL_COMMUNITY)
Admission: EM | Admit: 2023-09-08 | Discharge: 2023-09-09 | Disposition: A | Discharge status: Home / Self Care | Payer: 59 | Attending: Emergency Medicine | Admitting: Emergency Medicine

## 2023-09-08 DIAGNOSIS — M4317 Spondylolisthesis, lumbosacral region: Secondary | ICD-10-CM | POA: Diagnosis not present

## 2023-09-08 DIAGNOSIS — I700 Atherosclerosis of aorta: Secondary | ICD-10-CM | POA: Diagnosis not present

## 2023-09-08 DIAGNOSIS — K8020 Calculus of gallbladder without cholecystitis without obstruction: Secondary | ICD-10-CM | POA: Insufficient documentation

## 2023-09-08 DIAGNOSIS — M545 Low back pain, unspecified: Secondary | ICD-10-CM | POA: Insufficient documentation

## 2023-09-08 DIAGNOSIS — R9431 Abnormal electrocardiogram [ECG] [EKG]: Secondary | ICD-10-CM | POA: Diagnosis not present

## 2023-09-08 DIAGNOSIS — M47816 Spondylosis without myelopathy or radiculopathy, lumbar region: Secondary | ICD-10-CM | POA: Diagnosis not present

## 2023-09-08 LAB — COMPREHENSIVE METABOLIC PANEL
ALT: 24 U/L (ref 0–44)
AST: 22 U/L (ref 15–41)
Albumin: 2.5 g/dL — ABNORMAL LOW (ref 3.5–5.0)
Alkaline Phosphatase: 98 U/L (ref 38–126)
Anion gap: 13 (ref 5–15)
BUN: 34 mg/dL — ABNORMAL HIGH (ref 8–23)
CO2: 27 mmol/L (ref 22–32)
Calcium: 8.5 mg/dL — ABNORMAL LOW (ref 8.9–10.3)
Chloride: 101 mmol/L (ref 98–111)
Creatinine, Ser: 0.66 mg/dL (ref 0.44–1.00)
GFR, Estimated: >60 mL/min (ref >60)
Glucose, Bld: 127 mg/dL — ABNORMAL HIGH (ref 70–99)
Potassium: 3.5 mmol/L (ref 3.5–5.1)
Sodium: 141 mmol/L (ref 135–145)
Total Bilirubin: 0.9 mg/dL (ref 0.0–1.2)
Total Protein: 8.5 g/dL — ABNORMAL HIGH (ref 6.5–8.1)

## 2023-09-08 LAB — URINALYSIS, W/ REFLEX TO CULTURE (INFECTION SUSPECTED)
Bacteria, UA: NONE SEEN
Bilirubin Urine: NEGATIVE
Glucose, UA: NEGATIVE mg/dL
Ketones, ur: NEGATIVE mg/dL
Leukocytes,Ua: NEGATIVE
Nitrite: NEGATIVE
Protein, ur: NEGATIVE mg/dL
Specific Gravity, Urine: 1.013 (ref 1.005–1.030)
pH: 5 (ref 5.0–8.0)

## 2023-09-08 LAB — CBC WITH DIFFERENTIAL/PLATELET
Abs Immature Granulocytes: 0.06 10*3/uL (ref 0.00–0.07)
Basophils Absolute: 0 10*3/uL (ref 0.0–0.1)
Basophils Relative: 0 %
Eosinophils Absolute: 0.1 10*3/uL (ref 0.0–0.5)
Eosinophils Relative: 1 %
HCT: 33.6 % — ABNORMAL LOW (ref 36.0–46.0)
Hemoglobin: 9.9 g/dL — ABNORMAL LOW (ref 12.0–15.0)
Immature Granulocytes: 1 %
Lymphocytes Relative: 13 %
Lymphs Abs: 1.4 10*3/uL (ref 0.7–4.0)
MCH: 22.1 pg — ABNORMAL LOW (ref 26.0–34.0)
MCHC: 29.5 g/dL — ABNORMAL LOW (ref 30.0–36.0)
MCV: 75.2 fL — ABNORMAL LOW (ref 80.0–100.0)
Monocytes Absolute: 0.7 10*3/uL (ref 0.1–1.0)
Monocytes Relative: 7 %
Neutro Abs: 8.2 10*3/uL — ABNORMAL HIGH (ref 1.7–7.7)
Neutrophils Relative %: 78 %
Platelets: 393 10*3/uL (ref 150–400)
RBC: 4.47 MIL/uL (ref 3.87–5.11)
RDW: 20.7 % — ABNORMAL HIGH (ref 11.5–15.5)
WBC: 10.5 10*3/uL (ref 4.0–10.5)
nRBC: 0 % (ref 0.0–0.2)

## 2023-09-08 LAB — LIPASE, BLOOD: Lipase: 29 U/L (ref 11–51)

## 2023-09-08 LAB — PROTIME-INR
INR: 2.1 — ABNORMAL HIGH (ref 0.8–1.2)
Prothrombin Time: 24.2 s — ABNORMAL HIGH (ref 11.4–15.2)

## 2023-09-08 LAB — MAGNESIUM: Magnesium: 1.7 mg/dL (ref 1.7–2.4)

## 2023-09-08 MED ORDER — ACETAMINOPHEN 500 MG PO TABS
1000.0000 mg | ORAL_TABLET | Freq: Once | ORAL | Status: AC
  Administered 2023-09-08: 1000 mg via ORAL
  Filled 2023-09-08: qty 2

## 2023-09-08 MED ORDER — MORPHINE SULFATE (PF) 4 MG/ML IV SOLN
4.0000 mg | Freq: Once | INTRAVENOUS | Status: DC
  Filled 2023-09-08: qty 1

## 2023-09-08 NOTE — ED Provider Triage Note (Signed)
Emergency Medicine Provider Triage Evaluation Note  Zoe Cobb , a 68 y.o. female  was evaluated in triage.  Pt complains of low back pain.  Reports low back pain for the past several days.  Denies any associated dysuria or changes to bowel habits.  No fevers. States that her blood pressure cuff said that her heart rate was irregular when taking it.  Denies palpitations, chest pain, or shortness of breath.  Review of Systems  Positive: Low back pain Negative: Dysuria, changes to bowel habits  Physical Exam  BP 108/71 (BP Location: Right Wrist)   Pulse 79   Temp 98.1 F (36.7 C) (Oral)   Resp 18   Ht 5\' 1"  (1.549 m)   Wt (!) 190 kg   SpO2 95%   BMI 79.15 kg/m  Gen:   Awake, no distress   Resp:  Normal effort  MSK:   Moves extremities without difficulty  Other:    Medical Decision Making  Medically screening exam initiated at 1:30 PM.  Appropriate orders placed.  Zoe Cobb was informed that the remainder of the evaluation will be completed by another provider, this initial triage assessment does not replace that evaluation, and the importance of remaining in the ED until their evaluation is complete.   Maxwell Marion, PA-C 09/08/23 1332

## 2023-09-08 NOTE — ED Notes (Signed)
I called patient for vitals no one responded

## 2023-09-08 NOTE — ED Notes (Signed)
 [facility] called for transport.

## 2023-09-08 NOTE — ED Provider Notes (Signed)
[place] EMERGENCY DEPARTMENT AT [facility] Provider Note   CSN: [id number] Arrival date & time: 09/08/23  1133     History  Chief Complaint  Patient presents with   Back Pain    Zoe Cobb is a 68 y.o. female.  68 year old female with prior medical history as detailed below presents for evaluation.  Patient reports that approximately 2 days ago she noticed that her heart rate appeared to be elevated when she was taking her blood pressure cuff.  She denies associated palpitations, chest pain, shortness of breath.  This is not a regular occurrence.  She became anxious about the apparent reported heart rate increased.  Per her report, patient's blood pressure cuff reported a heart rate of approximately 120.  Patient also complains of vague low back pain.  She is nonambulatory at baseline.  She reports that sometimes when her back becomes achy she has urinary tract infection.  She denies dysuria.  She denies change in bowel movement.  She denies fever.  She denies other complaint.  The history is provided by the patient and medical records.       Home Medications Prior to Admission medications   Medication Sig Start Date End Date Taking? Authorizing Provider  acetaminophen (TYLENOL) 500 MG tablet Take 500 mg by mouth every 6 (six) hours as needed.    [provider]  metoprolol tartrate (LOPRESSOR) 50 MG tablet Take 50 mg by mouth 2 (two) times daily.    [provider]  vitamin B-12 (CYANOCOBALAMIN) 500 MCG tablet Take 500 mcg by mouth daily.    [provider]      Allergies    Penicillins    Review of Systems   Review of Systems  All other systems reviewed and are negative.   Physical Exam Updated Vital Signs BP 108/71 (BP Location: Right Wrist)   Pulse 79   Temp 98.1 F (36.7 C) (Oral)   Resp 18   Ht 5\' 1"  (1.549 m)   Wt (!) 190 kg   SpO2 95%   BMI 79.15 kg/m  Physical Exam Vitals and nursing note reviewed.   Constitutional:      General: She is not in acute distress.    Appearance: Normal appearance. She is well-developed.  HENT:     Head: Normocephalic and atraumatic.  Eyes:     Conjunctiva/sclera: Conjunctivae normal.     Pupils: Pupils are equal, round, and reactive to light.  Cardiovascular:     Rate and Rhythm: Normal rate and regular rhythm.     Heart sounds: Normal heart sounds.  Pulmonary:     Effort: Pulmonary effort is normal. No respiratory distress.     Breath sounds: Normal breath sounds.  Abdominal:     General: There is no distension.     Palpations: Abdomen is soft.     Tenderness: There is no abdominal tenderness.  Musculoskeletal:        General: No deformity. Normal range of motion.     Cervical back: Normal range of motion and neck supple.  Skin:    General: Skin is warm and dry.  Neurological:     General: No focal deficit present.     Mental Status: She is alert and oriented to person, place, and time.     ED Results / Procedures / Treatments   Labs (all labs ordered are listed, but only abnormal results are displayed) Labs Reviewed  COMPREHENSIVE METABOLIC PANEL  CBC WITH DIFFERENTIAL/PLATELET  EKG EKG Interpretation Date/Time:  Saturday September 08 2023 14:12:07 EST Ventricular Rate:  78 PR Interval:  179 QRS Duration:  98 QT Interval:  380 QTC Calculation: 433 R Axis:   46  Text Interpretation: Normal sinus rhythm Low voltage, precordial leads Borderline T abnormalities, anterior leads Confirmed by Kristine Royal [phone]) on 09/08/2023 2:31:03 PM  Radiology No results found.  Procedures Procedures    Medications Ordered in ED Medications - No data to display  ED Course/ Medical Decision Making/ A&P                                 Medical Decision Making Amount and/or Complexity of Data Reviewed Labs: ordered.  Risk OTC drugs.    Medical Screen Complete  This patient presented to the ED with complaint of rapid heart rate,  low back pain.  This complaint involves an extensive number of treatment options. The initial differential diagnosis includes, but is not limited to, arrhythmia, metabolic abnormality, infection, fracture, etc.  This presentation is: Acute, Chronic, Self-Limited, Previously Undiagnosed, Uncertain Prognosis, Complicated, Systemic Symptoms, and Threat to Life/Bodily Function  Patient is presenting with multiple complaints.  Primary complaint appears to be reported rapid heart rate at home.  This has not been recorded here in the ED.  Patient also complains of vague low back pain.  This improved with use of Tylenol alone.  Screening labs obtained are without significant acute abnormality.  Screening imaging obtained is without significant acute abnormality.  Patient feels much improved after ED evaluation.  She desires discharge.  She declines additional workup and/or overnight observation.  Importance of close follow-up is stressed.  Strict return precautions given and understood.  Additional history obtained:  External records from outside sources obtained and reviewed including prior ED visits and prior Inpatient records.    Lab Tests:  I ordered and personally interpreted labs.  The pertinent results include: CBC, CMP, UA, magnesium, lipase INR   Imaging Studies ordered:  I ordered imaging studies including CT lumbar spine I independently visualized and interpreted obtained imaging which showed NAD I agree with the radiologist interpretation.   Cardiac Monitoring:  The patient was maintained on a cardiac monitor.  I personally viewed and interpreted the cardiac monitor which showed an underlying rhythm of: NSR   Medicines ordered:  I ordered medication including Tylenol for low back pain Reevaluation of the patient after these medicines showed that the patient: resolved  Problem List / ED Course:  Low back pain   Reevaluation:  After the interventions noted above,  I reevaluated the patient and found that they have: resolved   Disposition:  After consideration of the diagnostic results and the patients response to treatment, I feel that the patent would benefit from close outpatient follow-up.          Final Clinical Impression(s) / ED Diagnoses Final diagnoses:  Low back pain without sciatica, unspecified back pain laterality, unspecified chronicity    Rx / DC Orders ED Discharge Orders     None         Wynetta Fines, MD 09/08/23 2122

## 2023-09-08 NOTE — ED Triage Notes (Signed)
BIB EMS from home for high heart rate reading on BP machine at home over the last few days. Pt denies chest pain, c/o lower back pain Hx of afib, EMS reports controlled afib, HR of 80-90

## 2023-09-08 NOTE — Discharge Instructions (Signed)
 Return for any problem.  ?

## 2023-09-20 ENCOUNTER — Inpatient Hospital Stay: Payer: 59 | Attending: Gynecologic Oncology | Admitting: Gynecologic Oncology

## 2023-09-20 ENCOUNTER — Encounter: Payer: Self-pay | Admitting: Gynecologic Oncology

## 2023-09-20 VITALS — BP 107/67 | HR 72 | Temp 98.3°F | Resp 19

## 2023-09-20 DIAGNOSIS — Z975 Presence of (intrauterine) contraceptive device: Secondary | ICD-10-CM | POA: Insufficient documentation

## 2023-09-20 DIAGNOSIS — C541 Malignant neoplasm of endometrium: Secondary | ICD-10-CM | POA: Diagnosis not present

## 2023-09-20 DIAGNOSIS — Z79890 Hormone replacement therapy (postmenopausal): Secondary | ICD-10-CM | POA: Diagnosis not present

## 2023-09-20 DIAGNOSIS — Z6841 Body Mass Index (BMI) 40.0 and over, adult: Secondary | ICD-10-CM | POA: Insufficient documentation

## 2023-09-20 NOTE — Patient Instructions (Signed)
It was good to see you today.  I will call you with your biopsy results.  We will tentatively plan on follow-up in 3 months.

## 2023-09-20 NOTE — Progress Notes (Signed)
Gynecologic Oncology Return Clinic Visit  09/20/23  Reason for Visit: surveillance   Treatment History: The patient was admitted in 10/2021 for anemia in the setting of vaginal bleeding with plan for outpatient follow-up within a week, which appears did not happen.  Patient was started on 40 mg of Megace twice daily for a total of 7 days.   In January, the patient was seen as a new patient for menopausal bleeding for more than a year.  This was described as intermittent without associated pain or cramping.  Plan at that time was to proceed with EUA and D&C with ultrasound to assess endometrial lining.  Also discussed suppression with progesterone containing IUD.   The patient has had multiple admissions since for vaginal and/or rectal bleeding.  She was admitted in February for bleeding in the setting of an INR elevated at 3.5.  The patient was admitted in April with vaginal and rectal bleeding found to have a supratherapeutic INR in the setting of being on Coumadin for A-fib.  She received vitamin K.  She was treated for diverticulitis at that time.  She was briefly admitted again in July after she presented with recurrent vaginal bleeding with an episode lasting for 3 days.  Her hemoglobin was noted to be 7.7.  This is in the setting of being on Coumadin for A-fib with an INR of 3.6.  Her Coumadin was held while he received vitamin K until INR decreased.  She was also given 2 units of packed red blood cells.   Pelvic ultrasound exam was performed on 03/01/2023 and this revealed a uterus measuring 5.7 x 7 x 13.4 cm with a 4 x 5 x 5.2 lesion measured in the uterus corresponding to a fundal leiomyoma better seen on prior CT scan.  Endometrium with markedly limited evaluation, measures up to 12 mm.  Neither ovary seen.   The patient was taken for hysteroscopy with endometrial sampling on 03/12/2023.  Findings at surgery included normal-appearing cervix with abundant friable tissue within a dilated cervical  os.  Hysteroscopy was abandoned secondary to abundant tissue limiting view.  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   The patient held her Coumadin prior to her recent procedure, restarted it on 8/29.  She endorses beginning to have vaginal bleeding and pelvic pain on 8/30 with passage of large blood clots.  This is large-volume vaginal bleeding.  On presentation to the emergency department, mildly hypotensive (blood pressures normally run low), heart rate in the 90s.  Hemoglobin 8.3 with an INR of 2.3.  Patient was admitted to the stepdown unit and transfused 1 unit of packed red blood cells.  She was also given Kcentra to reverse her INR.  The setting of hypotension, CT scan was obtained to rule out source of infection.  This was notable for a blood clot in the cervix versus upper vagina.  She was started on Rocephin and doxycycline for possible endometritis.  Interval History: Doing well, thinks she had some minor vaginal bleeding a few days ago.  Past Medical/Surgical History: Past Medical History:  Diagnosis Date   Arthritis    CHF (congestive heart failure) (HCC)    Diabetes mellitus without complication (HCC)    metformin   Dysrhythmia    A-fib   Hypertension    Morbid obesity (HCC)    Multiple sclerosis (HCC)  asymptomatic   Paroxysmal atrial fibrillation [facility])     Past Surgical History:  Procedure Laterality Date   COLONOSCOPY WITH PROPOFOL N/A 12/15/2020   Procedure: COLONOSCOPY WITH PROPOFOL;  Surgeon: Meryl Dare, MD;  Location[facility] ENDOSCOPY;  Service: Endoscopy;  Laterality: N/A;   DILATATION & CURETTAGE/HYSTEROSCOPY WITH MYOSURE N/A 04/04/2023   Procedure: DILATATION & CURETTAGE/HYSTEROSCOPY WITH MYOSURE;  Surgeon: Carver Fila, MD;  Location: [facility] ORS;  Service:  Gynecology;  Laterality: N/A;   HYSTEROSCOPY WITH D & C N/A 03/12/2023   Procedure: DIAGNOSTIC HYSTEROSCOPY, ENDOMETRIAL BIOPSY;  Surgeon: Lorriane Shire, MD;  Location: [facility] ORS;  Service: Gynecology;  Laterality: N/A;   INTRAUTERINE DEVICE (IUD) INSERTION N/A 04/04/2023   Procedure: INTRAUTERINE DEVICE (IUD) INSERTION MIRENA, TILT TEST;  Surgeon: Carver Fila, MD;  Location: [facility] ORS;  Service: Gynecology;  Laterality: N/A;    Family History  Problem Relation Age of Onset   Hypertension Mother    Diabetes Mother    Stomach cancer Father    Kidney disease Sister    Diabetes Sister    Diabetes Sister    Diabetes Sister    Heart attack Brother    HIV/AIDS Brother    Diabetes Brother    Prostate cancer Brother    Colon cancer Neg Hx    Breast cancer Neg Hx    Ovarian cancer Neg Hx    Endometrial cancer Neg Hx    Pancreatic cancer Neg Hx     Social History   Socioeconomic History   Marital status: Married    Spouse name: Not on file   Number of children: 2   Years of education: Not on file   Highest education level: Not on file  Occupational History   Not on file  Tobacco Use   Smoking status: Former    Current packs/day: 0.00    Average packs/day: 1 pack/day for 9.0 years (9.0 ttl pk-yrs)    Types: Cigarettes    Start date: 2    Quit date: 1996    Years since quitting: 29.1   Smokeless tobacco: Never  Vaping Use   Vaping status: Never Used  Substance and Sexual Activity   Alcohol use: No    Alcohol/week: 0.0 standard drinks of alcohol   Drug use: No   Sexual activity: Not on file  Other Topics Concern   Not on file  Social History Narrative   Pt lives by herself, she completed hs.    Social Drivers of Corporate investment banker Strain: Not on file  Food Insecurity: No Food Insecurity (04/08/2023)   Hunger Vital Sign    Worried About Running Out of Food in the Last Year: Never true    Ran Out of Food in the Last Year: Never true  Transportation  Needs: No Transportation Needs (04/08/2023)   PRAPARE - Administrator, Civil Service (Medical): No    Lack of Transportation (Non-Medical): No  Physical Activity: Not on file  Stress: Not on file  Social Connections: Not on file    Current Medications:  Current Outpatient Medications:    acetaminophen (TYLENOL) 500 MG tablet, Take 500 mg by mouth every 6 (six) hours as needed., Disp: , Rfl:    metoprolol tartrate (LOPRESSOR) 50 MG tablet, Take 50 mg by mouth 2 (two) times daily., Disp: , Rfl:    vitamin B-12 (CYANOCOBALAMIN) 500 MCG tablet, Take 500 mcg by mouth daily., Disp: , Rfl:   Review of Systems: Denies appetite changes,  fevers, chills, fatigue, unexplained weight changes. Denies hearing loss, neck lumps or masses, mouth sores, ringing in ears or voice changes. Denies cough or wheezing.  Denies shortness of breath. Denies chest pain or palpitations. Denies leg swelling. Denies abdominal distention, pain, blood in stools, constipation, diarrhea, nausea, vomiting, or early satiety. Denies pain with intercourse, dysuria, frequency, hematuria or incontinence. Denies hot flashes, pelvic pain, vaginal bleeding or vaginal discharge.   Denies joint pain, back pain or muscle pain/cramps. Denies itching, rash, or wounds. Denies dizziness, headaches, numbness or seizures. Denies swollen lymph nodes or glands, denies easy bruising or bleeding. Denies anxiety, depression, confusion, or decreased concentration.  Physical Exam: BP 107/67 (BP Location: Left Arm, Patient Position: Sitting)   Pulse 72   Temp 98.3 F (36.8 C) (Oral)   Resp 19   SpO2 97%  General: Alert, oriented, no acute distress. HEENT: Posterior oropharynx clear, sclera anicteric. Chest: Unlabored breathing on room air.  GU: Normal appearing external genitalia without erythema, excoriation, or lesions.  Speculum exam reveals vagina without abnormalities.  Cervix unable to be visualized.  Bimanual exam  reveals cervix normal on palpation, IUD strings palpated.  Body habitus limits uterine evaluation.  Endometrial biopsy procedure Preoperative diagnosis: low grade endometrial cancer Postoperative diagnosis: Same as above Physician: Pricilla Holm MD Estimated blood loss: Minimal Specimens: Endometrial biopsy Procedure: After the procedure was discussed with the patient, she gave verbal consent.  Using the [facility] lift, she was then moved from her wheelchair to the exam table.  She was left in the [place] lift for the procedure.  Legs were elevated.  Speculum exam was performed however given difficulty visualizing the cervix, speculum was removed.  Betadine was used x 3 to swab the upper vagina.  An endometrial Pipelle was then passed until resistance met over my finger.  1 pass was performed with sufficient tissue obtained.  This was placed in formalin.  Overall the patient tolerated the procedure well.    Laboratory & Radiologic Studies: None new   Assessment & Plan: Zoe Cobb is a 68 y.o. 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   IUD placed at the time of D&C on 04/04/23. MMRp, p53 WT.   Patient is overall doing very well.  Minimal bleeding symptoms since I last saw her.  Endometrial biopsy performed today.  I will call her with these results.  We will tentatively plan on a visit in 3 months.  Given challenges with sampling, may delay follow-up somewhat longer pending biopsy results.  28 minutes of total time was spent for this patient encounter, including preparation, face-to-face counseling with the patient and coordination of care, and documentation of the encounter.  Eugene Garnet, MD  Division of Gynecologic Oncology  Department of Obstetrics and Gynecology  [facility]

## 2023-09-25 LAB — SURGICAL PATHOLOGY

## 2023-09-28 ENCOUNTER — Telehealth: Payer: Self-pay | Admitting: Gynecologic Oncology

## 2023-09-28 NOTE — Telephone Encounter (Signed)
 Called the patient to discuss her recent biopsy results which shows persistent grade 1 endometrioid endometrial adenocarcinoma.  She has been treated with a Mirena IUD for 6 months.  Discussed options including follow-up biopsy in 3 months with addition of other source of progesterone at that time if persistent disease.  We also discussed possibility of adding additional progesterone now.  She is interested in addition of more progesterone.  We discussed insertion of a second Mirena IUD versus starting oral progesterone.  She would like to avoid side effects of oral progesterone.  I will ask my clinic to get her scheduled next month for clinic visit to place a second Mirena IUD.  Eugene Garnet MD Gynecologic Oncology

## 2023-10-03 ENCOUNTER — Other Ambulatory Visit: Payer: Self-pay

## 2023-10-03 ENCOUNTER — Telehealth: Payer: Self-pay | Admitting: Oncology

## 2023-10-03 ENCOUNTER — Telehealth: Payer: Self-pay | Admitting: *Deleted

## 2023-10-03 ENCOUNTER — Other Ambulatory Visit: Payer: Self-pay | Admitting: Gynecologic Oncology

## 2023-10-03 ENCOUNTER — Other Ambulatory Visit (HOSPITAL_COMMUNITY): Payer: Self-pay

## 2023-10-03 DIAGNOSIS — C541 Malignant neoplasm of endometrium: Secondary | ICD-10-CM

## 2023-10-03 MED ORDER — LEVONORGESTREL 20 MCG/DAY IU IUD
1.0000 | INTRAUTERINE_SYSTEM | Freq: Once | INTRAUTERINE | 0 refills | Status: AC
Start: 2023-10-03 — End: 2024-05-09
  Filled 2023-10-03: qty 1, 1d supply, fill #0

## 2023-10-03 NOTE — Telephone Encounter (Signed)
 Per Dr Pricilla Holm, patient scheduled for an appointment for IUD placement in the office on 3/13 at 8:15 am. Patient given arrival time of 7:45am. Patient to call the office back to confirm appointment.

## 2023-10-03 NOTE — Telephone Encounter (Signed)
 Called [facility].  There will be no charge to the patient for the Mirena IUD after insurance.

## 2023-10-04 ENCOUNTER — Telehealth: Payer: Self-pay | Admitting: *Deleted

## 2023-10-04 NOTE — Telephone Encounter (Signed)
 Spoke with Zoe Cobb who called the office back to let us know that her appt. That is scheduled for Thursday, March 13 for 0830 with Dr.Tucker will not work due to transportation not being able to arrive her at scheduled time. Pt advised the office would call back with another date for her appt.

## 2023-10-04 NOTE — Telephone Encounter (Signed)
 Spoke with [name] and patient scheduled for 3/13 at 1015 with arrival time of 0930 for appt. With Dr. Pricilla Holm. Pt agreed to date and time and thanked the office for calling.

## 2023-10-05 ENCOUNTER — Ambulatory Visit: Payer: 59 | Attending: Cardiology

## 2023-10-05 DIAGNOSIS — Z7901 Long term (current) use of anticoagulants: Secondary | ICD-10-CM

## 2023-10-05 DIAGNOSIS — I480 Paroxysmal atrial fibrillation: Secondary | ICD-10-CM

## 2023-10-05 LAB — POCT INR: INR: 1.8 — AB (ref 2.0–3.0)

## 2023-10-05 NOTE — Patient Instructions (Signed)
 Take 2 tablets today only then continue 2.5 mg Daily, except 5 mg on Thursday.  INR in 4 weeks. [facility] 3 per week  [phone]  [phone]

## 2023-10-18 ENCOUNTER — Encounter: Payer: Self-pay | Admitting: Gynecologic Oncology

## 2023-10-18 ENCOUNTER — Inpatient Hospital Stay: Payer: 59 | Attending: Gynecologic Oncology | Admitting: Gynecologic Oncology

## 2023-10-18 ENCOUNTER — Ambulatory Visit: Payer: 59 | Admitting: Gynecologic Oncology

## 2023-10-18 ENCOUNTER — Ambulatory Visit (HOSPITAL_COMMUNITY)
Admission: RE | Admit: 2023-10-18 | Discharge: 2023-10-18 | Disposition: A | Discharge status: Home / Self Care | Source: Ambulatory Visit | Attending: Gynecologic Oncology | Admitting: Gynecologic Oncology

## 2023-10-18 VITALS — BP 111/76 | HR 100 | Temp 98.7°F | Resp 20

## 2023-10-18 DIAGNOSIS — Z79890 Hormone replacement therapy (postmenopausal): Secondary | ICD-10-CM | POA: Diagnosis not present

## 2023-10-18 DIAGNOSIS — N939 Abnormal uterine and vaginal bleeding, unspecified: Secondary | ICD-10-CM | POA: Diagnosis not present

## 2023-10-18 DIAGNOSIS — C541 Malignant neoplasm of endometrium: Secondary | ICD-10-CM | POA: Insufficient documentation

## 2023-10-18 DIAGNOSIS — Z975 Presence of (intrauterine) contraceptive device: Secondary | ICD-10-CM | POA: Insufficient documentation

## 2023-10-18 DIAGNOSIS — Z6841 Body Mass Index (BMI) 40.0 and over, adult: Secondary | ICD-10-CM

## 2023-10-18 DIAGNOSIS — Z30430 Encounter for insertion of intrauterine contraceptive device: Secondary | ICD-10-CM

## 2023-10-18 NOTE — Patient Instructions (Signed)
 Today Dr. Pricilla Holm placed another Mirena IUD into the uterus. We had ultrasound come to the office to confirm placement.   We will plan on seeing you back in the office in three months for re-evaluation and resampling of the lining of the uterus (endometrial biopsy) to assess response to the two IUDs on the lining of the uterus.

## 2023-10-18 NOTE — Progress Notes (Signed)
 Gynecologic Oncology Return Clinic Visit  10/18/23  Reason for Visit: IUD insertion  Treatment History: The patient was admitted in 10/2021 for anemia in the setting of vaginal bleeding with plan for outpatient follow-up within a week, which appears did not happen.  Patient was started on 40 mg of Megace twice daily for a total of 7 days.   In January, the patient was seen as a new patient for menopausal bleeding for more than a year.  This was described as intermittent without associated pain or cramping.  Plan at that time was to proceed with EUA and D&C with ultrasound to assess endometrial lining.  Also discussed suppression with progesterone containing IUD.   The patient has had multiple admissions since for vaginal and/or rectal bleeding.  She was admitted in February for bleeding in the setting of an INR elevated at 3.5.  The patient was admitted in April with vaginal and rectal bleeding found to have a supratherapeutic INR in the setting of being on Coumadin for A-fib.  She received vitamin K.  She was treated for diverticulitis at that time.  She was briefly admitted again in July after she presented with recurrent vaginal bleeding with an episode lasting for 3 days.  Her hemoglobin was noted to be 7.7.  This is in the setting of being on Coumadin for A-fib with an INR of 3.6.  Her Coumadin was held while he received vitamin K until INR decreased.  She was also given 2 units of packed red blood cells.   Pelvic ultrasound exam was performed on 03/01/2023 and this revealed a uterus measuring 5.7 x 7 x 13.4 cm with a 4 x 5 x 5.2 lesion measured in the uterus corresponding to a fundal leiomyoma better seen on prior CT scan.  Endometrium with markedly limited evaluation, measures up to 12 mm.  Neither ovary seen.   The patient was taken for hysteroscopy with endometrial sampling on 03/12/2023.  Findings at surgery included normal-appearing cervix with abundant friable tissue within a dilated cervical  os.  Hysteroscopy was abandoned secondary to abundant tissue limiting view.  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   The patient held her Coumadin prior to her recent procedure, restarted it on 8/29.  She endorses beginning to have vaginal bleeding and pelvic pain on 8/30 with passage of large blood clots.  This is large-volume vaginal bleeding.  On presentation to the emergency department, mildly hypotensive (blood pressures normally run low), heart rate in the 90s.  Hemoglobin 8.3 with an INR of 2.3.  Patient was admitted to the stepdown unit and transfused 1 unit of packed red blood cells.  She was also given Kcentra to reverse her INR.  The setting of hypotension, CT scan was obtained to rule out source of infection.  This was notable for a blood clot in the cervix versus upper vagina.  She was started on Rocephin and doxycycline for possible endometritis.  EMB on 09/20/23: FIGO grade 1 endometrioid adenocarcinoma.  Interval History: Doing well. Started having some bleeding this morning.  Past Medical/Surgical History: Past Medical History:  Diagnosis Date   Arthritis    CHF (congestive heart failure) (HCC)    Diabetes mellitus without complication (HCC)    metformin   Dysrhythmia    A-fib   Hypertension    Morbid obesity (HCC)  Multiple sclerosis (HCC)    asymptomatic   Paroxysmal atrial fibrillation [facility])     Past Surgical History:  Procedure Laterality Date   COLONOSCOPY WITH PROPOFOL N/A 12/15/2020   Procedure: COLONOSCOPY WITH PROPOFOL;  Surgeon: Meryl Dare, MD;  Location: [facility] ENDOSCOPY;  Service: Endoscopy;  Laterality: N/A;   DILATATION & CURETTAGE/HYSTEROSCOPY WITH MYOSURE N/A 04/04/2023   Procedure: DILATATION & CURETTAGE/HYSTEROSCOPY WITH MYOSURE;  Surgeon: Carver Fila, MD;  Location: [facility] ORS;  Service: Gynecology;  Laterality: N/A;   HYSTEROSCOPY WITH D & C N/A 03/12/2023   Procedure: DIAGNOSTIC HYSTEROSCOPY, ENDOMETRIAL BIOPSY;  Surgeon: Lorriane Shire, MD;  Location: [facility] ORS;  Service: Gynecology;  Laterality: N/A;   INTRAUTERINE DEVICE (IUD) INSERTION N/A 04/04/2023   Procedure: INTRAUTERINE DEVICE (IUD) INSERTION MIRENA, TILT TEST;  Surgeon: Carver Fila, MD;  Location: [facility] ORS;  Service: Gynecology;  Laterality: N/A;    Family History  Problem Relation Age of Onset   Hypertension Mother    Diabetes Mother    Stomach cancer Father    Kidney disease Sister    Diabetes Sister    Diabetes Sister    Diabetes Sister    Heart attack Brother    HIV/AIDS Brother    Diabetes Brother    Prostate cancer Brother    Colon cancer Neg Hx    Breast cancer Neg Hx    Ovarian cancer Neg Hx    Endometrial cancer Neg Hx    Pancreatic cancer Neg Hx     Social History   Socioeconomic History   Marital status: Married    Spouse name: Not on file   Number of children: 2   Years of education: Not on file   Highest education level: Not on file  Occupational History   Not on file  Tobacco Use   Smoking status: Former    Current packs/day: 0.00    Average packs/day: 1 pack/day for 9.0 years (9.0 ttl pk-yrs)    Types: Cigarettes    Start date: 77    Quit date: 1996    Years since quitting: 29.2   Smokeless tobacco: Never  Vaping Use   Vaping status: Never Used  Substance and Sexual Activity   Alcohol use: No    Alcohol/week: 0.0 standard drinks of alcohol   Drug use: No   Sexual activity: Not on file  Other Topics Concern   Not on file  Social History Narrative   Pt lives by herself, she completed hs.    Social Drivers of Corporate investment banker Strain: Not on file  Food Insecurity: No Food Insecurity (04/08/2023)   Hunger Vital Sign    Worried About Running Out of Food in the Last Year: Never true    Ran Out of Food in  the Last Year: Never true  Transportation Needs: No Transportation Needs (04/08/2023)   PRAPARE - Administrator, Civil Service (Medical): No    Lack of Transportation (Non-Medical): No  Physical Activity: Not on file  Stress: Not on file  Social Connections: Not on file    Current Medications:  Current Outpatient Medications:    acetaminophen (TYLENOL) 500 MG tablet, Take 500 mg by mouth every 6 (six) hours as needed., Disp: , Rfl:    metoprolol tartrate (LOPRESSOR) 50 MG tablet, Take 50 mg by mouth 2 (two) times daily., Disp: , Rfl:    vitamin B-12 (CYANOCOBALAMIN) 500 MCG tablet, Take 500 mcg by mouth daily., Disp: , Rfl:  levonorgestrel (MIRENA) 20 MCG/DAY IUD, Intrauterine device to be placed in office., Disp: 1 each, Rfl: 0  Review of Systems: Denies appetite changes, fevers, chills, fatigue, unexplained weight changes. Denies hearing loss, neck lumps or masses, mouth sores, ringing in ears or voice changes. Denies cough or wheezing.  Denies shortness of breath. Denies chest pain or palpitations. Denies leg swelling. Denies abdominal distention, pain, blood in stools, constipation, diarrhea, nausea, vomiting, or early satiety. Denies pain with intercourse, dysuria, frequency, hematuria or incontinence. Denies hot flashes, pelvic pain, or vaginal discharge.   Denies joint pain, back pain or muscle pain/cramps. Denies itching, rash, or wounds. Denies dizziness, headaches, numbness or seizures. Denies swollen lymph nodes or glands, denies easy bruising or bleeding. Denies anxiety, depression, confusion, or decreased concentration.  Physical Exam: BP 111/76 (BP Location: Right Arm, Patient Position: Sitting)   Pulse 100   Temp 98.7 F (37.1 C) (Oral)   Resp 20   SpO2 97%  General: Alert, oriented, no acute distress. HEENT: Posterior oropharynx clear, sclera anicteric. Chest: Unlabored breathing on room air. GU: Normal appearing external genitalia without  erythema, excoriation, or lesions.  Speculum exam reveals normal-appearing vagina.  IUD strings visible but despite using multiple different speculums, given the patient's position still on the [facility] lift, was unable to visualize the cervix.  On bimanual exam, cervix palpable, dilated 1-2 cm, IUD strings palpated.  Endometrial biopsy procedure Preoperative diagnosis: FIGO grade 1 endometrial cancer, IUD in place Postoperative diagnosis: Same as above Physician: Pricilla Holm MD Estimated blood loss: Minimal IUD Lot #[id number], exp 11/2024 Specimens: IUD insertion Procedure: After the procedure was discussed with the patient including risks and benefits, she gave verbal consent.  She was then placed in dorsolithotomy position, although this was somewhat limited by her body habitus and mobility of her legs.  With 2 people holding each leg, speculum was placed in the vagina.  IUD strings were visible but the cervix could not be visualized despite attempts with a large plastic speculum, and large metal speculum.  Betadine was used to cleanse the upper vagina x 3.  Speculum was then removed.  Cervix was palpated with my right hand.  IUD was then attempted to be inserted through the cervix by palpation.  Due to some bending of the IUD inserter, this had to be done several times but for the IUD was ultimately felt to be inserted through the cervix into the uterus.  The IUD was then deployed and the inserter removed.  Strings were cut just inside the introitus.  I palpated again after insertion and could not feel the IUD within the dilated cervix.  Overall this procedure took approximately 25 minutes. The patient tolerated the procedure well.    Given difficulty with placement and the patient's body habitus, I am recommending that we get ultrasound images to ensure that the IUD is within the uterine cavity.  Laboratory & Radiologic Studies: None new  Assessment & Plan: Zoe Cobb is a 68 y.o. 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   IUD placed at the time of D&C on 04/04/23. Recent biopsy in 09/2023 with persistent grade 1 endometrioid adenocarcinoma. MMRp, p53 WT.  IUD attempted to be placed today, quite challenging given constraints with [facility]iel [facility] lift and body habitus.  Will obtain imaging to ensure that the second IUD is within the endometrial cavity.  If not, we will need to plan on IUD insertion in the OR.  Otherwise, plan for follow-up in 3 months.  38  minutes of total time was spent for this patient encounter, including preparation, face-to-face counseling with the patient and coordination of care, and documentation of the encounter.  Eugene Garnet, MD  Division of Gynecologic Oncology  Department of Obstetrics and Gynecology  [facility]    Addendum: Ultrasound (transvaginal and transabdominal) performed after visit. On transabdominal views, IUD could be seen at uterine fundus and second IUD within LUS.

## 2023-10-23 NOTE — Progress Notes (Signed)
 Zoe Cobb - could you call radiology and have them review this again? When I looked at the pictures with the person who was taking the pictures and clips, we saw both an IUD at the fundus and one in the lower uterine segment. Thanks!

## 2023-11-01 ENCOUNTER — Ambulatory Visit: Payer: 59

## 2023-11-05 ENCOUNTER — Other Ambulatory Visit: Payer: 59

## 2023-11-07 ENCOUNTER — Ambulatory Visit: Attending: Cardiology

## 2023-11-07 DIAGNOSIS — Z7901 Long term (current) use of anticoagulants: Secondary | ICD-10-CM

## 2023-11-07 DIAGNOSIS — I480 Paroxysmal atrial fibrillation: Secondary | ICD-10-CM | POA: Diagnosis not present

## 2023-11-07 LAB — POCT INR: INR: 2.4 (ref 2.0–3.0)

## 2023-11-07 NOTE — Patient Instructions (Addendum)
 Description   Continue 2.5 mg Daily, except 5 mg on Thursday.   Recheck INR in 4 weeks.  [facility] 3 per week Cou[facility]in Clinic: [phone]   Main Number: [phone]

## 2023-11-20 ENCOUNTER — Ambulatory Visit
Admission: RE | Admit: 2023-11-20 | Discharge: 2023-11-20 | Disposition: A | Discharge status: Home / Self Care | Source: Ambulatory Visit | Attending: Adult Health | Admitting: Adult Health

## 2023-11-20 DIAGNOSIS — N6310 Unspecified lump in the right breast, unspecified quadrant: Secondary | ICD-10-CM

## 2023-11-21 ENCOUNTER — Other Ambulatory Visit: Payer: Self-pay | Admitting: Adult Health

## 2023-11-21 DIAGNOSIS — N6310 Unspecified lump in the right breast, unspecified quadrant: Secondary | ICD-10-CM

## 2023-11-22 ENCOUNTER — Other Ambulatory Visit: Payer: Self-pay | Admitting: Cardiology

## 2023-11-22 DIAGNOSIS — I480 Paroxysmal atrial fibrillation: Secondary | ICD-10-CM

## 2023-11-22 NOTE — Telephone Encounter (Signed)
 Prescription refill request received for warfarin [facility]:  06/21/22 [facility])  Next INR check: 12/05/23 Warfarin tablet strength: 5mg   Office visit overdue - note placed on upcoming coumadin clinic appt to scheduled pt with provider. Refill sent.

## 2023-12-04 ENCOUNTER — Telehealth: Payer: Self-pay | Admitting: *Deleted

## 2023-12-04 NOTE — Telephone Encounter (Signed)
 Called pt to remind that her Anticoagulation Appt is tomorrow at the new location: [address] she verbalized understanding.

## 2023-12-05 ENCOUNTER — Ambulatory Visit: Attending: Cardiology | Admitting: *Deleted

## 2023-12-05 DIAGNOSIS — Z7901 Long term (current) use of anticoagulants: Secondary | ICD-10-CM | POA: Diagnosis not present

## 2023-12-05 DIAGNOSIS — I480 Paroxysmal atrial fibrillation: Secondary | ICD-10-CM

## 2023-12-05 LAB — POCT INR: INR: 2.9 (ref 2.0–3.0)

## 2023-12-05 NOTE — Patient Instructions (Signed)
 Description   Today take 2.5mg  of warfarin then continue 2.5 mg Daily, except 5 mg on Thursday.   Recheck INR in 4 weeks.  [facility] 3 per week Coumadin  Clinic: [phone]   Main Number: [phone]

## 2023-12-13 ENCOUNTER — Emergency Department (HOSPITAL_COMMUNITY)

## 2023-12-13 ENCOUNTER — Inpatient Hospital Stay (HOSPITAL_COMMUNITY)
Admission: EM | Admit: 2023-12-13 | Discharge: 2023-12-16 | DRG: 377 | Disposition: A | Discharge status: Home / Self Care | Attending: Internal Medicine | Admitting: Internal Medicine

## 2023-12-13 ENCOUNTER — Inpatient Hospital Stay (HOSPITAL_COMMUNITY)

## 2023-12-13 ENCOUNTER — Encounter (HOSPITAL_COMMUNITY): Payer: Self-pay | Admitting: Emergency Medicine

## 2023-12-13 DIAGNOSIS — Z8619 Personal history of other infectious and parasitic diseases: Secondary | ICD-10-CM | POA: Diagnosis not present

## 2023-12-13 DIAGNOSIS — I5032 Chronic diastolic (congestive) heart failure: Secondary | ICD-10-CM | POA: Diagnosis present

## 2023-12-13 DIAGNOSIS — E119 Type 2 diabetes mellitus without complications: Secondary | ICD-10-CM | POA: Diagnosis present

## 2023-12-13 DIAGNOSIS — Z993 Dependence on wheelchair: Secondary | ICD-10-CM | POA: Diagnosis not present

## 2023-12-13 DIAGNOSIS — K625 Hemorrhage of anus and rectum: Principal | ICD-10-CM

## 2023-12-13 DIAGNOSIS — Z87891 Personal history of nicotine dependence: Secondary | ICD-10-CM

## 2023-12-13 DIAGNOSIS — Z6841 Body Mass Index (BMI) 40.0 and over, adult: Secondary | ICD-10-CM | POA: Diagnosis not present

## 2023-12-13 DIAGNOSIS — C541 Malignant neoplasm of endometrium: Secondary | ICD-10-CM | POA: Diagnosis present

## 2023-12-13 DIAGNOSIS — I110 Hypertensive heart disease with heart failure: Secondary | ICD-10-CM | POA: Diagnosis present

## 2023-12-13 DIAGNOSIS — K922 Gastrointestinal hemorrhage, unspecified: Secondary | ICD-10-CM | POA: Diagnosis not present

## 2023-12-13 DIAGNOSIS — I10 Essential (primary) hypertension: Secondary | ICD-10-CM | POA: Diagnosis not present

## 2023-12-13 DIAGNOSIS — K5733 Diverticulitis of large intestine without perforation or abscess with bleeding: Secondary | ICD-10-CM | POA: Diagnosis present

## 2023-12-13 DIAGNOSIS — Z880 Allergy status to penicillin: Secondary | ICD-10-CM

## 2023-12-13 DIAGNOSIS — G35 Multiple sclerosis: Secondary | ICD-10-CM | POA: Diagnosis present

## 2023-12-13 DIAGNOSIS — R6510 Systemic inflammatory response syndrome (SIRS) of non-infectious origin without acute organ dysfunction: Secondary | ICD-10-CM | POA: Diagnosis present

## 2023-12-13 DIAGNOSIS — Z7984 Long term (current) use of oral hypoglycemic drugs: Secondary | ICD-10-CM | POA: Diagnosis not present

## 2023-12-13 DIAGNOSIS — Z79899 Other long term (current) drug therapy: Secondary | ICD-10-CM

## 2023-12-13 DIAGNOSIS — Z8719 Personal history of other diseases of the digestive system: Secondary | ICD-10-CM

## 2023-12-13 DIAGNOSIS — I480 Paroxysmal atrial fibrillation: Secondary | ICD-10-CM

## 2023-12-13 DIAGNOSIS — D62 Acute posthemorrhagic anemia: Secondary | ICD-10-CM

## 2023-12-13 DIAGNOSIS — D6832 Hemorrhagic disorder due to extrinsic circulating anticoagulants: Secondary | ICD-10-CM | POA: Diagnosis present

## 2023-12-13 DIAGNOSIS — R791 Abnormal coagulation profile: Secondary | ICD-10-CM | POA: Diagnosis not present

## 2023-12-13 DIAGNOSIS — R578 Other shock: Secondary | ICD-10-CM | POA: Diagnosis present

## 2023-12-13 DIAGNOSIS — Z7901 Long term (current) use of anticoagulants: Secondary | ICD-10-CM

## 2023-12-13 DIAGNOSIS — E1122 Type 2 diabetes mellitus with diabetic chronic kidney disease: Secondary | ICD-10-CM

## 2023-12-13 DIAGNOSIS — Z8249 Family history of ischemic heart disease and other diseases of the circulatory system: Secondary | ICD-10-CM | POA: Diagnosis not present

## 2023-12-13 DIAGNOSIS — E66813 Obesity, class 3: Secondary | ICD-10-CM | POA: Diagnosis present

## 2023-12-13 HISTORY — PX: IR ANGIOGRAM VISCERAL SELECTIVE: IMG657

## 2023-12-13 HISTORY — PX: IR ANGIOGRAM SELECTIVE EACH ADDITIONAL VESSEL: IMG667

## 2023-12-13 HISTORY — PX: IR US GUIDE VASC ACCESS RIGHT: IMG2390

## 2023-12-13 LAB — FERRITIN: Ferritin: 21 ng/mL (ref 11–307)

## 2023-12-13 LAB — CBC WITH DIFFERENTIAL/PLATELET
Abs Immature Granulocytes: 0.05 10*3/uL (ref 0.00–0.07)
Basophils Absolute: 0 10*3/uL (ref 0.0–0.1)
Basophils Relative: 1 %
Eosinophils Absolute: 0.2 10*3/uL (ref 0.0–0.5)
Eosinophils Relative: 2 %
HCT: 32.1 % — ABNORMAL LOW (ref 36.0–46.0)
Hemoglobin: 9.1 g/dL — ABNORMAL LOW (ref 12.0–15.0)
Immature Granulocytes: 1 %
Lymphocytes Relative: 21 %
Lymphs Abs: 1.9 10*3/uL (ref 0.7–4.0)
MCH: 21.9 pg — ABNORMAL LOW (ref 26.0–34.0)
MCHC: 28.3 g/dL — ABNORMAL LOW (ref 30.0–36.0)
MCV: 77.2 fL — ABNORMAL LOW (ref 80.0–100.0)
Monocytes Absolute: 0.6 10*3/uL (ref 0.1–1.0)
Monocytes Relative: 7 %
Neutro Abs: 6 10*3/uL (ref 1.7–7.7)
Neutrophils Relative %: 68 %
Platelets: 328 10*3/uL (ref 150–400)
RBC: 4.16 MIL/uL (ref 3.87–5.11)
RDW: 18.6 % — ABNORMAL HIGH (ref 11.5–15.5)
WBC: 8.8 10*3/uL (ref 4.0–10.5)
nRBC: 0 % (ref 0.0–0.2)

## 2023-12-13 LAB — COMPREHENSIVE METABOLIC PANEL WITH GFR
ALT: 14 U/L (ref 0–44)
AST: 26 U/L (ref 15–41)
Albumin: 2.6 g/dL — ABNORMAL LOW (ref 3.5–5.0)
Alkaline Phosphatase: 78 U/L (ref 38–126)
Anion gap: 10 (ref 5–15)
BUN: 33 mg/dL — ABNORMAL HIGH (ref 8–23)
CO2: 23 mmol/L (ref 22–32)
Calcium: 8.4 mg/dL — ABNORMAL LOW (ref 8.9–10.3)
Chloride: 105 mmol/L (ref 98–111)
Creatinine, Ser: 0.88 mg/dL (ref 0.44–1.00)
GFR, Estimated: >60 mL/min (ref >60)
Glucose, Bld: 121 mg/dL — ABNORMAL HIGH (ref 70–99)
Potassium: 4.8 mmol/L (ref 3.5–5.1)
Sodium: 138 mmol/L (ref 135–145)
Total Bilirubin: 1.1 mg/dL (ref 0.0–1.2)
Total Protein: 8 g/dL (ref 6.5–8.1)

## 2023-12-13 LAB — IRON AND TIBC
Iron: 28 ug/dL (ref 28–170)
Saturation Ratios: 9 % — ABNORMAL LOW (ref 10.4–31.8)
TIBC: 301 ug/dL (ref 250–450)
UIBC: 273 ug/dL

## 2023-12-13 LAB — GLUCOSE, CAPILLARY
Glucose-Capillary: 163 mg/dL — ABNORMAL HIGH (ref 70–99)
Glucose-Capillary: 168 mg/dL — ABNORMAL HIGH (ref 70–99)

## 2023-12-13 LAB — CBC
HCT: 26.4 % — ABNORMAL LOW (ref 36.0–46.0)
Hemoglobin: 7.6 g/dL — ABNORMAL LOW (ref 12.0–15.0)
MCH: 22.2 pg — ABNORMAL LOW (ref 26.0–34.0)
MCHC: 28.8 g/dL — ABNORMAL LOW (ref 30.0–36.0)
MCV: 77 fL — ABNORMAL LOW (ref 80.0–100.0)
Platelets: 303 10*3/uL (ref 150–400)
RBC: 3.43 MIL/uL — ABNORMAL LOW (ref 3.87–5.11)
RDW: 18.6 % — ABNORMAL HIGH (ref 11.5–15.5)
WBC: 25.6 10*3/uL — ABNORMAL HIGH (ref 4.0–10.5)
nRBC: 0 % (ref 0.0–0.2)

## 2023-12-13 LAB — PROTIME-INR
INR: 3.3 — ABNORMAL HIGH (ref 0.8–1.2)
INR: 3.7 — ABNORMAL HIGH (ref 0.8–1.2)
Prothrombin Time: 34.1 s — ABNORMAL HIGH (ref 11.4–15.2)
Prothrombin Time: 37 s — ABNORMAL HIGH (ref 11.4–15.2)

## 2023-12-13 LAB — RETICULOCYTES
Immature Retic Fract: 39.7 % — ABNORMAL HIGH (ref 2.3–15.9)
RBC.: 3.45 MIL/uL — ABNORMAL LOW (ref 3.87–5.11)
Retic Count, Absolute: 64.9 10*3/uL (ref 19.0–186.0)
Retic Ct Pct: 1.9 % (ref 0.4–3.1)

## 2023-12-13 LAB — MAGNESIUM: Magnesium: 1.5 mg/dL — ABNORMAL LOW (ref 1.7–2.4)

## 2023-12-13 LAB — HEMOGLOBIN A1C
Hgb A1c MFr Bld: 6.7 % — ABNORMAL HIGH (ref 4.8–5.6)
Mean Plasma Glucose: 145.59 mg/dL

## 2023-12-13 LAB — PREPARE RBC (CROSSMATCH)

## 2023-12-13 LAB — CK: Total CK: 18 U/L — ABNORMAL LOW (ref 38–234)

## 2023-12-13 LAB — LIPASE, BLOOD: Lipase: 33 U/L (ref 11–51)

## 2023-12-13 LAB — PHOSPHORUS: Phosphorus: 3.2 mg/dL (ref 2.5–4.6)

## 2023-12-13 MED ORDER — PROTHROMBIN COMPLEX CONC HUMAN 500 UNITS IV KIT
2254.0000 [IU] | PACK | Status: AC
  Administered 2023-12-13: 2254 [IU] via INTRAVENOUS
  Filled 2023-12-13: qty 2254

## 2023-12-13 MED ORDER — IOHEXOL 300 MG/ML  SOLN
100.0000 mL | Freq: Once | INTRAMUSCULAR | Status: AC | PRN
  Administered 2023-12-13: 50 mL via INTRA_ARTERIAL

## 2023-12-13 MED ORDER — SODIUM CHLORIDE 0.9 % IV BOLUS
1000.0000 mL | Freq: Once | INTRAVENOUS | Status: AC
  Administered 2023-12-13: 1000 mL via INTRAVENOUS

## 2023-12-13 MED ORDER — NITROGLYCERIN IN D5W 100-5 MCG/ML-% IV SOLN
INTRAVENOUS | Status: AC
  Filled 2023-12-13: qty 250

## 2023-12-13 MED ORDER — INSULIN ASPART 100 UNIT/ML IJ SOLN
0.0000 [IU] | INTRAMUSCULAR | Status: DC
  Administered 2023-12-14: 2 [IU] via SUBCUTANEOUS
  Administered 2023-12-15: 3 [IU] via SUBCUTANEOUS
  Administered 2023-12-15 – 2023-12-16 (×4): 1 [IU] via SUBCUTANEOUS
  Administered 2023-12-16: 2 [IU] via SUBCUTANEOUS
  Administered 2023-12-16: 1 [IU] via SUBCUTANEOUS
  Administered 2023-12-16: 2 [IU] via SUBCUTANEOUS
  Filled 2023-12-13: qty 0.09

## 2023-12-13 MED ORDER — MAGNESIUM SULFATE 2 GM/50ML IV SOLN
2.0000 g | Freq: Once | INTRAVENOUS | Status: AC
  Administered 2023-12-14: 2 g via INTRAVENOUS
  Filled 2023-12-13: qty 50

## 2023-12-13 MED ORDER — IOHEXOL 350 MG/ML SOLN
100.0000 mL | Freq: Once | INTRAVENOUS | Status: AC | PRN
Start: 2023-12-13 — End: 2023-12-13
  Administered 2023-12-13: 100 mL via INTRAVENOUS

## 2023-12-13 MED ORDER — ACETAMINOPHEN 650 MG RE SUPP: 650.0000 mg | Freq: Four times a day (QID) | RECTAL | Status: DC | PRN

## 2023-12-13 MED ORDER — PANTOPRAZOLE SODIUM 40 MG IV SOLR
40.0000 mg | Freq: Once | INTRAVENOUS | Status: AC
Start: 2023-12-13 — End: 2023-12-13
  Administered 2023-12-13: 40 mg via INTRAVENOUS
  Filled 2023-12-13: qty 10

## 2023-12-13 MED ORDER — HYDROCODONE-ACETAMINOPHEN 5-325 MG PO TABS: 1.0000 | ORAL_TABLET | ORAL | Status: DC | PRN

## 2023-12-13 MED ORDER — FENTANYL CITRATE (PF) 100 MCG/2ML IJ SOLN
INTRAMUSCULAR | Status: AC | PRN
  Administered 2023-12-13 (×2): 50 ug via INTRAVENOUS

## 2023-12-13 MED ORDER — LIDOCAINE-EPINEPHRINE 1 %-1:100000 IJ SOLN
INTRAMUSCULAR | Status: AC
  Filled 2023-12-13: qty 1

## 2023-12-13 MED ORDER — EMPTY CONTAINERS FLEXIBLE MISC: 2000.0000 [IU] | Status: DC

## 2023-12-13 MED ORDER — VITAMIN K1 10 MG/ML IJ SOLN
10.0000 mg | Freq: Once | INTRAVENOUS | Status: DC
  Administered 2023-12-13: 10 mg via INTRAVENOUS
  Filled 2023-12-13: qty 1

## 2023-12-13 MED ORDER — GUAIFENESIN ER 600 MG PO TB12
600.0000 mg | ORAL_TABLET | Freq: Two times a day (BID) | ORAL | Status: DC
  Administered 2023-12-15 – 2023-12-16 (×3): 600 mg via ORAL
  Filled 2023-12-13 (×3): qty 1

## 2023-12-13 MED ORDER — CHLORHEXIDINE GLUCONATE CLOTH 2 % EX PADS
6.0000 | MEDICATED_PAD | Freq: Every day | CUTANEOUS | Status: DC
  Administered 2023-12-14 – 2023-12-16 (×4): 6 via TOPICAL

## 2023-12-13 MED ORDER — ONDANSETRON HCL 4 MG PO TABS: 4.0000 mg | ORAL_TABLET | Freq: Four times a day (QID) | ORAL | Status: DC | PRN

## 2023-12-13 MED ORDER — SODIUM CHLORIDE 0.9% IV SOLUTION: Freq: Once | INTRAVENOUS | Status: DC

## 2023-12-13 MED ORDER — IOHEXOL 300 MG/ML  SOLN
100.0000 mL | Freq: Once | INTRAMUSCULAR | Status: AC | PRN
  Administered 2023-12-13: 50 mL

## 2023-12-13 MED ORDER — FENTANYL CITRATE (PF) 100 MCG/2ML IJ SOLN
INTRAMUSCULAR | Status: AC
  Filled 2023-12-13: qty 4

## 2023-12-13 MED ORDER — MIDAZOLAM HCL 2 MG/2ML IJ SOLN
INTRAMUSCULAR | Status: AC
  Filled 2023-12-13: qty 4

## 2023-12-13 MED ORDER — ACETAMINOPHEN 325 MG PO TABS: 650.0000 mg | ORAL_TABLET | Freq: Four times a day (QID) | ORAL | Status: DC | PRN

## 2023-12-13 MED ORDER — NITROGLYCERIN 1 MG/10 ML FOR IR/CATH LAB
INTRA_ARTERIAL | Status: AC | PRN
  Administered 2023-12-13: 100 ug via INTRA_ARTERIAL

## 2023-12-13 MED ORDER — VITAMIN K1 10 MG/ML IJ SOLN: 10.0000 mg | INTRAVENOUS | Status: DC

## 2023-12-13 MED ORDER — ONDANSETRON HCL 4 MG/2ML IJ SOLN
4.0000 mg | Freq: Four times a day (QID) | INTRAMUSCULAR | Status: DC | PRN
Start: 2023-12-13 — End: 2023-12-16

## 2023-12-13 MED ORDER — SODIUM CHLORIDE 0.9 % IV SOLN: INTRAVENOUS | Status: AC

## 2023-12-13 MED ORDER — LIDOCAINE-EPINEPHRINE 1 %-1:100000 IJ SOLN
20.0000 mL | Freq: Once | INTRAMUSCULAR | Status: AC
  Administered 2023-12-13: 8 mL via INTRADERMAL

## 2023-12-13 NOTE — Consult Note (Signed)
 Chief Complaint: GI bleeding  Referring Physician(s): Dr. Ranelle Buys  Cardiology: Dr. Berry Bristol.   Supervising Physician: Myrlene Asper  Patient Status: [facility] - ED  History of Present Illness: Zoe Cobb is a 68 y.o. female presenting to [facility] with acute melena.   Zoe Cobb tells me that this is not the first time she has had GI bleed.  She said "about a year ago" she was treated at the hospital for the same, with medical care.    She tells me that the current episode started yesterday, with painless melena.  She take coumadin  for atrial fibrillation.   When speaking to her in the ED, she is comfortable in bed.  She had a meal about 1 hour prior to our discussion.    SBP:  96 - 122.  Current 122 DBP: 58 - 104.  Current 104 HR:  95-115 RR: 15-22  She has been prescribed Vit K for her coumadin .    Labs: WBC: 8.8 H/H: 9.1/32.1 Platelets: 328 Na: 138 Cr: 0.88 BUN: 33  CTA performed today 5:13pm shows extravasation in the sigmoid colon.    Past Medical History:  Diagnosis Date   Arthritis    CHF (congestive heart failure) (HCC)    Diabetes mellitus without complication (HCC)    metformin   Dysrhythmia    A-fib   Hypertension    Morbid obesity (HCC)    Multiple sclerosis (HCC)    asymptomatic   Paroxysmal atrial fibrillation [facility])     Past Surgical History:  Procedure Laterality Date   COLONOSCOPY WITH PROPOFOL  N/A 12/15/2020   Procedure: COLONOSCOPY WITH PROPOFOL ;  Surgeon: Asencion Blacksmith, MD;  Location: [facility] ENDOSCOPY;  Service: Endoscopy;  Laterality: N/A;   DILATATION & CURETTAGE/HYSTEROSCOPY WITH MYOSURE N/A 04/04/2023   Procedure: DILATATION & CURETTAGE/HYSTEROSCOPY WITH MYOSURE;  Surgeon: Suzi Essex, MD;  Location: [facility] ORS;  Service: Gynecology;  Laterality: N/A;   HYSTEROSCOPY WITH D & C N/A 03/12/2023   Procedure: DIAGNOSTIC HYSTEROSCOPY, ENDOMETRIAL BIOPSY;  Surgeon: Kiki Pelton, MD;  Location: [facility] ORS;  Service: Gynecology;  Laterality:  N/A;   INTRAUTERINE DEVICE (IUD) INSERTION N/A 04/04/2023   Procedure: INTRAUTERINE DEVICE (IUD) INSERTION MIRENA , TILT TEST;  Surgeon: Suzi Essex, MD;  Location: [facility] ORS;  Service: Gynecology;  Laterality: N/A;    Allergies: Penicillins  Medications: Prior to Admission medications   Medication Sig Start Date End Date Taking? Authorizing Provider  acetaminophen  (TYLENOL ) 500 MG tablet Take 500 mg by mouth every 6 (six) hours as needed.   Yes [provider]  isosorbide mononitrate (IMDUR) 60 MG 24 hr tablet Take 60 mg by mouth daily. 12/04/23  Yes [provider]  levonorgestrel  (MIRENA ) 20 MCG/DAY IUD Intrauterine device to be placed in office. 10/03/23 12/13/23 Yes Cross, Melissa D, NP  metFORMIN (GLUCOPHAGE) 1000 MG tablet Take 1,000 mg by mouth 2 (two) times daily. 10/29/23  Yes [provider]  methocarbamol (ROBAXIN) 500 MG tablet Take 500 mg by mouth 2 (two) times daily as needed for muscle spasms. 09/12/23  Yes [provider]  metoprolol  succinate (TOPROL -XL) 50 MG 24 hr tablet Take 75 mg by mouth daily. 09/11/23  Yes [provider]  vitamin B-12 (CYANOCOBALAMIN ) 500 MCG tablet Take 500 mcg by mouth daily.   Yes [provider]  warfarin (COUMADIN ) 2.5 MG tablet Take 2.5mg  to 5mg  by mouth daily as directed by coumadin  clinic 11/22/23  Yes Knox Perl, MD  cefdinir  (OMNICEF ) 300 MG capsule Take 300 mg by mouth  2 (two) times daily. Patient not taking: Reported on 12/13/2023 11/19/23   [provider]     Family History  Problem Relation Age of Onset   Hypertension Mother    Diabetes Mother    Stomach cancer Father    Kidney disease Sister    Diabetes Sister    Diabetes Sister    Diabetes Sister    Heart attack Brother    HIV/AIDS Brother    Diabetes Brother    Prostate cancer Brother    Colon cancer Neg Hx    Breast cancer Neg Hx    Ovarian cancer Neg Hx    Endometrial cancer Neg Hx    Pancreatic cancer Neg Hx      Social History   Socioeconomic History   Marital status: Married    Spouse name: Not on file   Number of children: 2   Years of education: Not on file   Highest education level: Not on file  Occupational History   Not on file  Tobacco Use   Smoking status: Former    Current packs/day: 0.00    Average packs/day: 1 pack/day for 9.0 years (9.0 ttl pk-yrs)    Types: Cigarettes    Start date: 89    Quit date: 1996    Years since quitting: 29.3   Smokeless tobacco: Never  Vaping Use   Vaping status: Never Used  Substance and Sexual Activity   Alcohol use: No    Alcohol/week: 0.0 standard drinks of alcohol   Drug use: No   Sexual activity: Not on file  Other Topics Concern   Not on file  Social History Narrative   Pt lives by herself, she completed hs.    Social Drivers of Corporate investment banker Strain: Not on file  Food Insecurity: No Food Insecurity (04/08/2023)   Hunger Vital Sign    Worried About Running Out of Food in the Last Year: Never true    Ran Out of Food in the Last Year: Never true  Transportation Needs: No Transportation Needs (04/08/2023)   PRAPARE - Administrator, Civil Service (Medical): No    Lack of Transportation (Non-Medical): No  Physical Activity: Not on file  Stress: Not on file  Social Connections: Not on file     Review of Systems: A 12 point ROS discussed and pertinent positives are indicated in the HPI above.  All other systems are negative.  Review of Systems  Vital Signs: BP (!) 122/104   Pulse (!) 115   Temp 98.4 F (36.9 C)   Resp 18   Ht 5\' 1"  (1.549 m)   Wt (!) 183.7 kg   SpO2 94%   BMI 76.52 kg/m      Physical Exam  General: 68 yo female appearing stated age.  Morbid obesity. Comfortable in bed ED 12 at [facility].  HEENT: Atraumatic, normocephalic.  Conjugate gaze, extra-ocular motor intact. No scleral icterus or scleral injection. No lesions on external ears, nose, lips, or gums.  Oral mucosa moist, pink.   Neck: Symmetric with no goiter enlargement.  Chest/Lungs:  Symmetric chest with inspiration/expiration.  No labored breathing.    Heart:   No JVD appreciated.  Abdomen:  Obese. Soft Genito-urinary: Deferred Neurologic: Alert & Oriented to person, place, and time.   Normal affect and insight.  Appropriate questions.   Pulse Exam:  Palpable femoral pulse     Imaging: CT ANGIO GI BLEED Result Date: 12/13/2023 CLINICAL DATA:  Lower  GI bleed.  Bloody stools. EXAM: CTA ABDOMEN AND PELVIS WITHOUT AND WITH CONTRAST TECHNIQUE: Multidetector CT imaging of the abdomen and pelvis was performed using the standard protocol during bolus administration of intravenous contrast. Multiplanar reconstructed images and MIPs were obtained and reviewed to evaluate the vascular anatomy. RADIATION DOSE REDUCTION: This exam was performed according to the departmental dose-optimization program which includes automated exposure control, adjustment of the mA and/or kV according to patient size and/or use of iterative reconstruction technique. CONTRAST:  OMNIPAQUE  IOHEXOL  350 MG/ML SOLN COMPARISON:  CT abdomen pelvis dated 04/07/2023. FINDINGS: Evaluation is limited due to body habitus and severe streak artifact caused by patient's arms. VASCULAR Aorta: Mild atherosclerotic calcification. No aneurysmal dilatation or dissection. No periaortic fluid collection. Celiac: The celiac trunk and its major branches appear patent. SMA: The SMA is patent. Renals: The renal arteries appear patent. IMA: The IMA is patent. Inflow: Mild atherosclerotic calcification of the iliac arteries. The iliac arteries are patent. No aneurysmal dilatation or dissection. Proximal Outflow: The visualized proximal outflow is patent. Veins: The IVC is unremarkable. No portal venous gas. The SMV, splenic vein, and main portal vein are patent. Review of the MIP images confirms the above findings. NON-VASCULAR Lower chest: The visualized lung bases are clear.  No intra-abdominal free air or free fluid. Hepatobiliary: The liver is unremarkable. No biliary dilatation. Multiple gallstones. No pericholecystic fluid or evidence of acute cholecystitis by CT. Pancreas: Unremarkable. No pancreatic ductal dilatation or surrounding inflammatory changes. Spleen: Normal in size without focal abnormality. Adrenals/Urinary Tract: The adrenal glands unremarkable. Mild bilateral renal parenchyma atrophy. Indeterminate bilateral renal hypodense lesions not characterized on this CT. There is no hydronephrosis on either side. The urinary bladder is grossly unremarkable. Stomach/Bowel: There is sigmoid diverticulosis with muscular hypertrophy. Contrast pooling in the sigmoid colon consistent with diverticular bleed. There is no bowel obstruction. The appendix is normal. Lymphatic: Mild aortoiliac atherosclerotic disease. The IVC is unremarkable. No portal venous gas. There is no adenopathy. Reproductive: The uterus is anteverted. Two intrauterine devices noted within the uterus, one of the which appears low in position. Other: None Musculoskeletal: Degenerative changes of the spine. IMPRESSION: 1. Active diverticular bleed of the sigmoid colon. 2. Cholelithiasis. 3. Two IUD with one low-lying intrauterine device. 4.  Aortic Atherosclerosis (ICD10-I70.0). Electronically Signed   By: Angus Bark M.D.   On: 12/13/2023 17:13   US  LIMITED ULTRASOUND INCLUDING AXILLA RIGHT BREAST Result Date: 11/20/2023 CLINICAL DATA:  Six-month follow-up ultrasound of right breast mass. EXAM: ULTRASOUND OF THE RIGHT BREAST COMPARISON:  04/05/2023 FINDINGS: Targeted sonographic evaluation of the right breast again demonstrates an oval circumscribed hypoechoic solid mass measuring 1.4 x 0.7 x 0.7 cm at 10 o'clock 12 CMFN. This mass measured 1.5 x 0.6 x 1.4 cm on the prior ultrasound from 04/05/2023. IMPRESSION: Probably benign oval hypoechoic right breast mass at 10 o'clock 12 CMFN, unchanged from prior  examination from 04/05/2023. RECOMMENDATION: Bilateral breast ultrasound in 6 months. I have discussed the findings and recommendations with the patient. If applicable, a reminder letter will be sent to the patient regarding the next appointment. BI-RADS CATEGORY  3: Probably benign. Electronically Signed   By: Elester Grim M.D.   On: 11/20/2023 10:12    Labs:  CBC: Recent Labs    04/10/23 0305 04/10/23 0828 04/11/23 0310 09/08/23 1626 12/13/23 1018  WBC 7.2  --  6.6 10.5 8.8  HGB 7.6* 7.8* 7.7* 9.9* 9.1*  HCT 25.1* 26.0* 25.8* 33.6* 32.1*  PLT 180  --  198 393 328    COAGS: Recent Labs    10/05/23 0925 11/07/23 0953 12/05/23 1011 12/13/23 1018  INR 1.8* 2.4 2.9 3.3*    BMP: Recent Labs    04/10/23 0305 04/11/23 0310 09/08/23 1626 12/13/23 1018  NA 139 139 141 138  K 3.5 3.5 3.5 4.8  CL 112* 110 101 105  CO2 23 23 27 23   GLUCOSE 120* 132* 127* 121*  BUN 11 10 34* 33*  CALCIUM 7.4* 7.5* 8.5* 8.4*  CREATININE 0.61 0.60 0.66 0.88  GFRNONAA >60 >60 >60 >60    LIVER FUNCTION TESTS: Recent Labs    04/07/23 0958 04/08/23 0338 09/08/23 1626 12/13/23 1018  BILITOT 0.6 0.7 0.9 1.1  AST 18 17 22 26   ALT 12 12 24 14   ALKPHOS 59 55 98 78  PROT 7.6 6.9 8.5* 8.0  ALBUMIN 2.4* 2.3* 2.5* 2.6*    TUMOR MARKERS: No results for input(s): "AFPTM", "CEA", "CA199", "CHROMGRNA" in the last 8760 hours.  Assessment and Plan:  Zoe Gergel is 68 female with acute lower GI hemorrhage.   She is currently therapeutic on coumadin  (afib) with INR of 3.3.   She is currently HD stable, with last SBP 122.    Vit K has just been administered.   I had a discussion with her regarding general medical care for lower GI bleeding, as well as the role of angiogram.  Regarding angiogram, we discussed logistics and risk benefit, with specific risks to include bleeding infection, artery injury, non-diagnostic, need for additional surgery/procedure, contrast reaction, CIN, non-target  embolization, bowel ischemia, cardiopulmonary collapse death.    We would not be able to offer sedation, given that she has just eaten.    After our discussion, she does not want angiogram at this time, and would like to give medical care chance to work.    VIR available if any changes.    Rec: - consider Kcentra /4-factor [facility] for more immediate effect, given vit K has up to 24 hours for full effect - serial H/H - continue resuscitation - call VIR on call with any changes, concerns  Electronically Signed: Myrlene Asper, DO 12/13/2023, 7:11 PM   I spent a total of 80 Minutes    in face to face in clinical consultation, greater than 50% of which was counseling/coordinating care for acute lower gi bleed, possible embolization

## 2023-12-13 NOTE — Assessment & Plan Note (Signed)
 SSI  Hold metformin

## 2023-12-13 NOTE — Assessment & Plan Note (Addendum)
 Will transfuse 1 unit now and follow CBC q 6h pt continue to have severe bleeding now with hemodynamic instability

## 2023-12-13 NOTE — ED Notes (Addendum)
 Gross red stool noted during chaperoned rectal exam with provider.

## 2023-12-13 NOTE — Assessment & Plan Note (Signed)
 Hold home meds

## 2023-12-13 NOTE — ED Notes (Signed)
 Repositioned patient, patient doesn't want a purewick at this time states she is sore down there now. Unable to get a clean urine sample as well.

## 2023-12-13 NOTE — Assessment & Plan Note (Signed)
 Admit to stepdown IR on the way for embolization  FFP, K centra, Vitamin K ordered  Transfuse 1 unit now and 2 on hold If remains hemodynamically unstable will need PCCM consult

## 2023-12-13 NOTE — ED Provider Notes (Signed)
  Physical Exam  BP 102/83   Pulse (!) 112   Temp 98.4 F (36.9 C)   Resp 17   Ht 5\' 1"  (1.549 m)   Wt (!) 183.7 kg   SpO2 99%   BMI 76.52 kg/m   Physical Exam Vitals and nursing note reviewed.  HENT:     Head: Normocephalic and atraumatic.  Eyes:     Pupils: Pupils are equal, round, and reactive to light.  Cardiovascular:     Rate and Rhythm: Normal rate and regular rhythm.  Pulmonary:     Effort: Pulmonary effort is normal.     Breath sounds: Normal breath sounds.  Abdominal:     Palpations: Abdomen is soft.     Tenderness: There is no abdominal tenderness.  Skin:    General: Skin is warm and dry.  Neurological:     Mental Status: She is alert.  Psychiatric:        Mood and Affect: Mood normal.     Procedures  Procedures  ED Course / MDM   Clinical Course as of 12/13/23 1845  Thu Dec 13, 2023  1005 Echo 1/18 with LVEF 55 to 60%, G2 DD  INR 4/30 was 2.9 [SG]  1222 Frank blood noted on exam, habitus does limit ability to visualize rectal opening.  [SG]  1613 Delay in imaging as pt refused imaging initially, is now agreeable  [SG]  1745 CTA shows active diverticular bleeding.  Reached back out to Dr.Magod (GI) who recommends warfarin reversal and reaching out to IR.  Will give vitamin K and FFP.  Patient blood pressure has remained stable here still mildly tachycardic.  Obtained verbal consent to administer blood products [MP]  1800 Discussed with Dr. Mabel Savage (IR) who will evaluate patient in the ED [MP]  1815 Will admit to hospitalist service [MP]    Clinical Course User Index [MP] Sallyanne Creamer, DO [SG] Teddi Favors, DO   Medical Decision Making I, Rafael Bun DO, have assumed care of this patient from the previous provider pending CTA abdomen pelvis to look for active GI bleed, reevaluation and admission  Amount and/or Complexity of Data Reviewed Labs: ordered. Radiology: ordered.  Risk Prescription drug management. Decision regarding  hospitalization.          Sallyanne Creamer, DO 12/13/23 1845

## 2023-12-13 NOTE — H&P (Signed)
 Zoe Cobb UJW:[id number] DOB: 07/25/1956 DOA: 12/13/2023     PCP: Thomasene Flemings, NP   Outpatient Specialists:  CARDS:  Dr.  Berry Bristol    GI  Dr.  Cherene Core,  )     Patient arrived to ER on 12/13/23 at [phone] Referred by Attending Selene Dais, MD   Patient coming from:    home Lives With family      Chief Complaint:   Chief Complaint  Patient presents with   Rectal Bleeding    HPI: Zoe Cobb is a 68 y.o. female with medical history significant of DM2, diverticulitis with bleeding, hypertension, morbid obesity, symptomatic multiple sclerosis, paroxysmal A-fib on Coumadin , hepatitis C, diastolic CHF    Presented with blood in stool Patient presents with painless bloody stools states had the same with diverticulitis in the past.  No nausea no vomiting no abdominal pain.  She is on Coumadin  for history of A-fib.  Reports dark blood mixed with clots since yesterday No pain with defecation INR found to be elevated at 3.3 Given a dose of FFP and vitamin K [facility] was consulted CT angio showed active diverticular bleed of the sigmoid colon and IR has been consulted will see patient in consult    Not on O2 at base line  Wheelchair bound  Denies significant ETOH intake   Does not smoke       Regarding pertinent Chronic problems:        HTN on isosorbide Toprol    chronic CHF diastolic          DM 2 -  Lab Results  Component Value Date   HGBA1C 6.9 (H) 03/09/2023   on  PO meds only,         Morbid obesity-   BMI Readings from Last 1 Encounters:  12/13/23 76.52 kg/m       A. Fib -   atrial fibrillation CHA2DS2 vas score   4        current  on anticoagulation with  Coumadin           -  Rate control:  Currently controlled with  Toprolol,      Hepatitis C treated  liver disease MELD 3.0: 20 at 12/13/2023 10:18 AM MELD-Na: 20 at 12/13/2023 10:18 AM Calculated from: Serum Creatinine: 0.88 mg/dL (Using min of 1 mg/dL) at 3/0/8657 84:69 AM Serum  Sodium: 138 mmol/L (Using max of 137 mmol/L) at 12/13/2023 10:18 AM Total Bilirubin: 1.1 mg/dL at 01/07/9527 41:32 AM Serum Albumin: 2.6 g/dL at 11/08/100 72:53 AM INR(ratio): 3.3 at 12/13/2023 10:18 AM Age at listing (hypothetical): 68 years Sex: Female at 12/13/2023 10:18 AM  Hepatic Function Panel     Component Value Date/Time   PROT 8.0 12/13/2023 1018   ALBUMIN 2.6 (L) 12/13/2023 1018   AST 26 12/13/2023 1018   ALT 14 12/13/2023 1018   ALKPHOS 78 12/13/2023 1018   BILITOT 1.1 12/13/2023 1018   BILIDIR 0.3 10/12/2016 1332   IBILI 0.7 10/12/2016 1332   INR 3.3  Chronic anemia - baseline hg Hemoglobin & Hematocrit  Recent Labs    04/11/23 0310 09/08/23 1626 12/13/23 1018  HGB 7.7* 9.9* 9.1*   Iron/TIBC/Ferritin/ %Sat    Component Value Date/Time   IRON 18 (L) 03/02/2023 0813   TIBC 252 03/02/2023 0813   FERRITIN 16 03/02/2023 0813   IRONPCTSAT 7 (L) 03/02/2023 0813       While in ER: Clinical Course as of 12/13/23 1857  Thu Dec 13, 2023  1005 Echo 1/18 with LVEF 55 to 60%, G2 DD  INR 4/30 was 2.9 [SG]  1222 Frank blood noted on exam, habitus does limit ability to visualize rectal opening.  [SG]  1613 Delay in imaging as pt refused imaging initially, is now agreeable  [SG]  1745 CTA shows active diverticular bleeding.  Reached back out to Dr.Magod (GI) who recommends warfarin reversal and reaching out to IR.  Will give vitamin K and FFP.  Patient blood pressure has remained stable here still mildly tachycardic.  Obtained verbal consent to administer blood products [MP]  1800 Discussed with Dr. Mabel Savage (IR) who will evaluate patient in the ED [MP]  1815 Will admit to hospitalist service [MP]    Clinical Course User Index [MP] Sallyanne Creamer, DO [SG] Teddi Favors, DO         Lab Orders         CBC with Differential         Comprehensive metabolic panel         Lipase, blood         Urinalysis, Routine w reflex microscopic -Urine, Clean Catch         Protime-INR         CTA abd/pelvis -   Active diverticular bleed of the sigmoid colon. 2. Cholelithiasis. 3. Two IUD with one low-lying intrauterine device. 4.  Aortic Atherosclerosis     Following Medications were ordered in ER: Medications  phytonadione  (VITAMIN K) 10 mg in dextrose  5 % 50 mL IVPB (10 mg Intravenous New Bag/Given 12/13/23 1835)  pantoprazole  (PROTONIX ) injection 40 mg (40 mg Intravenous Given 12/13/23 1131)  iohexol  (OMNIPAQUE ) 350 MG/ML injection 100 mL (100 mLs Intravenous Contrast Given 12/13/23 1629)  sodium chloride  0.9 % bolus 1,000 mL (1,000 mLs Intravenous New Bag/Given 12/13/23 1832)    _______________________________________________________ ER Provider Called:     [facility]  Dr. Lavaughn Portland They Recommend admit to medicine   Will see in AM     IR Dr. Mabel Savage Will see in Er    ED Triage Vitals  Encounter Vitals Group     BP 12/13/23 1008 110/75     Systolic BP Percentile --      Diastolic BP Percentile --      Pulse Rate 12/13/23 1008 (!) 105     Resp 12/13/23 1008 16     Temp 12/13/23 1008 97.8 F (36.6 C)     Temp Source 12/13/23 1008 Oral     SpO2 12/13/23 1002 98 %     Weight 12/13/23 1007 (!) 405 lb (183.7 kg)     Height 12/13/23 1007 5\' 1"  (1.549 m)     Head Circumference --      Peak Flow --      Pain Score 12/13/23 1007 0     Pain Loc --      Pain Education --      Exclude from Growth Chart --   GNFA(21)@     _________________________________________ Significant initial  Findings: Abnormal Labs Reviewed  CBC WITH DIFFERENTIAL/PLATELET - Abnormal; Notable for the following components:      Result Value   Hemoglobin 9.1 (*)    HCT 32.1 (*)    MCV 77.2 (*)    MCH 21.9 (*)    MCHC 28.3 (*)    RDW 18.6 (*)    All other components within normal limits  COMPREHENSIVE METABOLIC PANEL WITH GFR - Abnormal; Notable for the following components:   Glucose, Bld  121 (*)    BUN 33 (*)    Calcium 8.4 (*)    Albumin 2.6 (*)    All other components within normal  limits  PROTIME-INR - Abnormal; Notable for the following components:   Prothrombin  Time 34.1 (*)    INR 3.3 (*)    All other components within normal limits       ECG: Ordered    The recent clinical data is shown below. Vitals:   12/13/23 1745 12/13/23 1800 12/13/23 1815 12/13/23 1845  BP: 100/71 102/83 (!) 106/91 (!) 122/104  Pulse: (!) 114 (!) 112 (!) 110 (!) 115  Resp: 16 17 19 18   Temp:      TempSrc:      SpO2: 96% 99% 96% 94%  Weight:      Height:            WBC     Component Value Date/Time   WBC 8.8 12/13/2023 1018   LYMPHSABS 1.9 12/13/2023 1018   MONOABS 0.6 12/13/2023 1018   EOSABS 0.2 12/13/2023 1018   BASOSABS 0.0 12/13/2023 1018     Results for orders placed or performed during the hospital encounter of 04/07/23  MRSA Next Gen by PCR, Nasal     Status: None   Collection Time: 04/07/23  4:20 PM   Specimen: Nasal Mucosa; Nasal Swab  Result Value Ref Range Status   MRSA by PCR Next Gen NOT DETECTED NOT DETECTED Final    Comment: (NOTE) The GeneXpert MRSA Assay (FDA approved for NASAL specimens only), is one component of a comprehensive MRSA colonization surveillance program. It is not intended to diagnose MRSA infection nor to guide or monitor treatment for MRSA infections. Test performance is not FDA approved in patients less than 68 years old. Performed at [facility], [address], [place], [place] [address]   Culture, blood (Routine X 2) w Reflex to ID Panel     Status: None   Collection Time: 04/07/23 10:40 PM   Specimen: BLOOD LEFT HAND  Result Value Ref Range Status   Specimen Description BLOOD LEFT HAND  Final   Special Requests   Final    BOTTLES DRAWN AEROBIC ONLY Blood Culture adequate volume   Culture   Final    NO GROWTH 5 DAYS Performed at [facility], [address], [place], [place] [address]    Report Status 04/13/2023 FINAL  Final  Culture, blood (Routine X 2) w Reflex to ID Panel     Status:  None   Collection Time: 04/08/23  3:38 AM   Specimen: Site Not Specified  Result Value Ref Range Status   Specimen Description   Final    SITE NOT SPECIFIED Performed at [facility], [address], [place], [place] [address]    Special Requests   Final    BOTTLES DRAWN AEROBIC ONLY Blood Culture adequate volume Performed at [facility], [address], [place], [place] [address]    Culture   Final    NO GROWTH 5 DAYS Performed at [facility], [address], [place], [place] [address]    Report Status 04/13/2023 FINAL  Final    __________________________________________________________ Recent Labs  Lab 12/13/23 1018  NA 138  K 4.8  CO2 23  GLUCOSE 121*  BUN 33*  CREATININE 0.88  CALCIUM 8.4*    Cr   stable,   Lab Results  Component Value Date   CREATININE 0.88 12/13/2023  CREATININE 0.66 09/08/2023   CREATININE 0.60 04/11/2023    Recent Labs  Lab 12/13/23 1018  AST 26  ALT 14  ALKPHOS 78  BILITOT 1.1  PROT 8.0  ALBUMIN 2.6*   Lab Results  Component Value Date   CALCIUM 8.4 (L) 12/13/2023   PHOS 2.3 (L) 04/10/2023    Plt: Lab Results  Component Value Date   PLT 328 12/13/2023       Recent Labs  Lab 12/13/23 1018  WBC 8.8  NEUTROABS 6.0  HGB 9.1*  HCT 32.1*  MCV 77.2*  PLT 328    HG/HCT stable,     Component Value Date/Time   HGB 9.1 (L) 12/13/2023 1018   HCT 32.1 (L) 12/13/2023 1018   MCV 77.2 (L) 12/13/2023 1018      Recent Labs  Lab 12/13/23 1018  LIPASE 33    ________________________________________ Hospitalist was called for admission for   lower GI bleed while supertherapeutic on Coumadin     The following Work up has been ordered so far:  Orders Placed This Encounter  Procedures   CT ANGIO GI BLEED   IR Radiologist Eval & Mgmt   CBC with Differential   Comprehensive metabolic panel   Lipase, blood   Urinalysis, Routine w reflex microscopic -Urine, Clean Catch    Protime-INR   Diet NPO time specified   Bariatric Bed   Cardiac Monitoring - Continuous Indefinite   Consult to interventional radiologist (physician to physician consult) [phone]   monitoring by pharmacy   Consult to hospitalist   Prepare fresh frozen plasma   Type and screen [facility]   Insert peripheral IV   Saline lock IV   Admit to Inpatient (patient's expected length of stay will be greater than 2 midnights or inpatient only procedure)     OTHER Significant initial  Findings:  labs showing:     DM  labs:  HbA1C: Recent Labs    03/09/23 1328  HGBA1C 6.9*       CBG (last 3)  No results for input(s): "GLUCAP" in the last 72 hours.        Cultures:    Component Value Date/Time   SDES  04/08/2023 8295    SITE NOT SPECIFIED Performed at [facility], [address] [place], [place] [address]    SPECREQUEST  04/08/2023 [phone]    BOTTLES DRAWN AEROBIC ONLY Blood Culture adequate volume Performed at [facility], [address] [place], [place] [address]    CULT  04/08/2023 [phone]    NO GROWTH 5 DAYS Performed at [facility], [address] [place], [place] [address]    REPTSTATUS 04/13/2023 FINAL 04/08/2023 4401     Radiological Exams on Admission: CT ANGIO GI BLEED Result Date: 12/13/2023 CLINICAL DATA:  Lower GI bleed.  Bloody stools. EXAM: CTA ABDOMEN AND PELVIS WITHOUT AND WITH CONTRAST TECHNIQUE: Multidetector CT imaging of the abdomen and pelvis was performed using the standard protocol during bolus administration of intravenous contrast. Multiplanar reconstructed images and MIPs were obtained and reviewed to evaluate the vascular anatomy. RADIATION DOSE REDUCTION: This exam was performed according to the departmental dose-optimization program which includes automated exposure control, adjustment of the mA and/or kV according to patient size and/or use of iterative reconstruction technique.  CONTRAST:  OMNIPAQUE  IOHEXOL  350 MG/ML SOLN COMPARISON:  CT abdomen pelvis dated 04/07/2023. FINDINGS: Evaluation is limited due to body habitus and severe streak artifact caused by patient's arms. VASCULAR  Aorta: Mild atherosclerotic calcification. No aneurysmal dilatation or dissection. No periaortic fluid collection. Celiac: The celiac trunk and its major branches appear patent. SMA: The SMA is patent. Renals: The renal arteries appear patent. IMA: The IMA is patent. Inflow: Mild atherosclerotic calcification of the iliac arteries. The iliac arteries are patent. No aneurysmal dilatation or dissection. Proximal Outflow: The visualized proximal outflow is patent. Veins: The IVC is unremarkable. No portal venous gas. The SMV, splenic vein, and main portal vein are patent. Review of the MIP images confirms the above findings. NON-VASCULAR Lower chest: The visualized lung bases are clear. No intra-abdominal free air or free fluid. Hepatobiliary: The liver is unremarkable. No biliary dilatation. Multiple gallstones. No pericholecystic fluid or evidence of acute cholecystitis by CT. Pancreas: Unremarkable. No pancreatic ductal dilatation or surrounding inflammatory changes. Spleen: Normal in size without focal abnormality. Adrenals/Urinary Tract: The adrenal glands unremarkable. Mild bilateral renal parenchyma atrophy. Indeterminate bilateral renal hypodense lesions not characterized on this CT. There is no hydronephrosis on either side. The urinary bladder is grossly unremarkable. Stomach/Bowel: There is sigmoid diverticulosis with muscular hypertrophy. Contrast pooling in the sigmoid colon consistent with diverticular bleed. There is no bowel obstruction. The appendix is normal. Lymphatic: Mild aortoiliac atherosclerotic disease. The IVC is unremarkable. No portal venous gas. There is no adenopathy. Reproductive: The uterus is anteverted. Two intrauterine devices noted within the uterus, one of the which appears  low in position. Other: None Musculoskeletal: Degenerative changes of the spine. IMPRESSION: 1. Active diverticular bleed of the sigmoid colon. 2. Cholelithiasis. 3. Two IUD with one low-lying intrauterine device. 4.  Aortic Atherosclerosis (ICD10-I70.0). Electronically Signed   By: Angus Bark M.D.   On: 12/13/2023 17:13   _______________________________________________________________________________________________________ Latest  Blood pressure (!) 122/104, pulse (!) 115, temperature 98.4 F (36.9 C), resp. rate 18, height 5\' 1"  (1.549 m), weight (!) 183.7 kg, SpO2 94%.   Vitals  labs and radiology finding personally reviewed  Review of Systems:    Pertinent positives include:    blood in stool, Constitutional:  No weight loss, night sweats, Fevers, chills, fatigue, weight loss  HEENT:  No headaches, Difficulty swallowing,Tooth/dental problems,Sore throat,  No sneezing, itching, ear ache, nasal congestion, post nasal drip,  Cardio-vascular:  No chest pain, Orthopnea, PND, anasarca, dizziness, palpitations.no Bilateral lower extremity swelling  GI:  No heartburn, indigestion, abdominal pain, nausea, vomiting, diarrhea, change in bowel habits, loss of appetite, melena, hematemesis Resp:  no shortness of breath at rest. No dyspnea on exertion, No excess mucus, no productive cough, No non-productive cough, No coughing up of blood.No change in color of mucus.No wheezing. Skin:  no rash or lesions. No jaundice GU:  no dysuria, change in color of urine, no urgency or frequency. No straining to urinate.  No flank pain.  Musculoskeletal:  No joint pain or no joint swelling. No decreased range of motion. No back pain.  Psych:  No change in mood or affect. No depression or anxiety. No memory loss.  Neuro: no localizing neurological complaints, no tingling, no weakness, no double vision, no gait abnormality, no slurred speech, no confusion  All systems reviewed and apart from HOPI all  are negative _______________________________________________________________________________________________ Past Medical History:   Past Medical History:  Diagnosis Date   Arthritis    CHF (congestive heart failure) (HCC)    Diabetes mellitus without complication (HCC)    metformin   Dysrhythmia    A-fib   Hypertension    Morbid obesity (HCC)    Multiple sclerosis (HCC)  asymptomatic   Paroxysmal atrial fibrillation [facility])       Past Surgical History:  Procedure Laterality Date   COLONOSCOPY WITH PROPOFOL  N/A 12/15/2020   Procedure: COLONOSCOPY WITH PROPOFOL ;  Surgeon: Asencion Blacksmith, MD;  Location: [facility] ENDOSCOPY;  Service: Endoscopy;  Laterality: N/A;   DILATATION & CURETTAGE/HYSTEROSCOPY WITH MYOSURE N/A 04/04/2023   Procedure: DILATATION & CURETTAGE/HYSTEROSCOPY WITH MYOSURE;  Surgeon: Suzi Essex, MD;  Location: [facility] ORS;  Service: Gynecology;  Laterality: N/A;   HYSTEROSCOPY WITH D & C N/A 03/12/2023   Procedure: DIAGNOSTIC HYSTEROSCOPY, ENDOMETRIAL BIOPSY;  Surgeon: Kiki Pelton, MD;  Location: [facility] ORS;  Service: Gynecology;  Laterality: N/A;   INTRAUTERINE DEVICE (IUD) INSERTION N/A 04/04/2023   Procedure: INTRAUTERINE DEVICE (IUD) INSERTION MIRENA , TILT TEST;  Surgeon: Suzi Essex, MD;  Location: [facility] ORS;  Service: Gynecology;  Laterality: N/A;    Social History:  Ambulatory   wheelchair bound,      reports that she quit smoking about 29 years ago. Her smoking use included cigarettes. She started smoking about 38 years ago. She has a 9 pack-year smoking history. She has never used smokeless tobacco. She reports that she does not drink alcohol and does not use drugs.     Family History:   Family History  Problem Relation Age of Onset   Hypertension Mother    Diabetes Mother    Stomach cancer Father    Kidney disease Sister    Diabetes Sister    Diabetes Sister    Diabetes Sister    Heart attack Brother    HIV/AIDS Brother    Diabetes  Brother    Prostate cancer Brother    Colon cancer Neg Hx    Breast cancer Neg Hx    Ovarian cancer Neg Hx    Endometrial cancer Neg Hx    Pancreatic cancer Neg Hx    ______________________________________________________________________________________________ Allergies: Allergies  Allergen Reactions   Penicillins Swelling     Prior to Admission medications   Medication Sig Start Date End Date Taking? Authorizing Provider  acetaminophen  (TYLENOL ) 500 MG tablet Take 500 mg by mouth every 6 (six) hours as needed.   Yes [provider]  isosorbide mononitrate (IMDUR) 60 MG 24 hr tablet Take 60 mg by mouth daily. 12/04/23  Yes [provider]  levonorgestrel  (MIRENA ) 20 MCG/DAY IUD Intrauterine device to be placed in office. 10/03/23 12/13/23 Yes Cross, Melissa D, NP  metFORMIN (GLUCOPHAGE) 1000 MG tablet Take 1,000 mg by mouth 2 (two) times daily. 10/29/23  Yes [provider]  methocarbamol (ROBAXIN) 500 MG tablet Take 500 mg by mouth 2 (two) times daily as needed for muscle spasms. 09/12/23  Yes [provider]  metoprolol  succinate (TOPROL -XL) 50 MG 24 hr tablet Take 75 mg by mouth daily. 09/11/23  Yes [provider]  vitamin B-12 (CYANOCOBALAMIN ) 500 MCG tablet Take 500 mcg by mouth daily.   Yes [provider]  warfarin (COUMADIN ) 2.5 MG tablet Take 2.5mg  to 5mg  by mouth daily as directed by coumadin  clinic 11/22/23  Yes Knox Perl, MD  cefdinir  (OMNICEF ) 300 MG capsule Take 300 mg by mouth 2 (two) times daily. Patient not taking: Reported on 12/13/2023 11/19/23   [provider]    ___________________________________________________________________________________________________ Physical Exam:    12/13/2023    6:45 PM 12/13/2023    6:15 PM 12/13/2023    6:00 PM  Vitals with BMI  Systolic 122 106 409  Diastolic 104 91 83  Pulse 115 811 112  1. General:  in No  Acute distress   Chronically ill   -appearing 2.  Psychological: Alert and   Oriented 3. Head/ENT:    Dry Mucous Membranes                          Head Non traumatic, neck supple                         Poor Dentition 4. SKIN: normal   Skin turgor,  Skin clean Dry and intact no rash    5. Heart: Regular rate and rhythm no  Murmur, no Rub or gallop 6. Lungs: distant, no wheezes or crackles   7. Abdomen: Soft,  non-tender, Non distended   obese  bowel sounds present distant 8. Lower extremities: no clubbing, cyanosis, no  edema 9. Neurologically Grossly intact, moving all 4 extremities equally  10. MSK: Normal range of motion    Chart has been reviewed  ______________________________________________________________________________________________  Assessment/Plan 68 y.o. female with medical history significant of DM2, diverticulitis with bleeding, hypertension, morbid obesity, symptomatic multiple sclerosis, paroxysmal A-fib on Coumadin , hepatitis C, diastolic CHF   Admitted for     lower GI bleed while supertherapeutic on Coumadin    Present on Admission:  Supratherapeutic INR  Lower GI bleed  Paroxysmal atrial fibrillation (HCC)  Acute blood loss anemia on chronic iron deficiency anemia  Essential hypertension  Hemorrhagic shock (HCC)     Supratherapeutic INR Given a dose of FFP and Vitamin K and K centra Follow INR  Lower GI bleed - Suspect Lower Gi source  - Admit  For further management given:  Age >60 years,  comorbid illnesses  , gross rectal bleeding exposure to   anticoagulants.  -  most likely Diverticular source -  painless bleeding making colitis less likely      -  ER  Provider spoke to gastroenterology ( [facility], Dr. Lavaughn Portland ) they will see patient in a.m. Also discussed case with IR will see pt in consult appreciate their consult   - serial CBC.    - Monitor for any recurrence,  evidence of hemodynamic instability or significant blood loss -  type and screen,  - Transfuse as needed for hemoglobin below 7  or <9 if evidence of significant  bleeding  - Establish at least 2 PIV and fluid resuscitate   -  keep nothing by mouth    -  monitor for Recurrent significant  Bleeding     8:03 PM PT with massive blood BM hypotensive and tachycardic IR has been consulted will take pt for embolization now Will give 1 unit blood now and may need more to go If has persistent bleeding  If continues to be hemodynamically unstable will need PCCM consult     Morbid obesity with BMI of 70 and over, adult [facility]) Contributing to comorbidity and complicating medical management  Body mass index is 76.52 kg/m.  Nutritional follow up as an out pt would be recommended   Paroxysmal atrial fibrillation (HCC) Hold Coumadin  given GI bleed once blood pressure stable will restart Toprol   Acute blood loss anemia on chronic iron deficiency anemia Will transfuse 1 unit now and follow CBC q 6h pt continue to have severe bleeding now with hemodynamic instability  Essential hypertension Hold home meds   Hemorrhagic shock (HCC) Admit to stepdown IR on the way for embolization  FFP, K centra, Vitamin K ordered  Transfuse 1  unit now and 2 on hold If remains hemodynamically unstable will need PCCM consult  DM2 (diabetes mellitus, type 2) (HCC) SSI  Hold metformin   Other plan as per orders.  DVT prophylaxis:  SCD       Code Status:    Code Status: Prior FULL CODE   e as per patient  I had personally discussed CODE STATUS with patient   ACP   none    Family Communication:   Family not at  Bedside    Diet  Diet Orders (From admission, onward)     Start     Ordered   12/13/23 1758  Diet NPO time specified  Diet effective now        12/13/23 1757            Disposition Plan:      To home once workup is complete and patient is stable   Following barriers for discharge:                                                            Anemia  stable                                                        Will need consultants to evaluate patient prior to discharge                             Consult Orders  (From admission, onward)           Start     Ordered   12/13/23 1815  Consult to hospitalist  Once       Provider:  (Not yet assigned)  Question Answer Comment  Place call to: [facility]   Reason for Consult Admit      12/13/23 1814                                Consults called:  [facility]    Treatment Team:  Ozell Blunt, MD  IR  Admission status:  ED Disposition     ED Disposition  Admit   Condition  --   Comment  Hospital Area: [facility] [[id number]]  Level of Care: Progressive [102]  Admit to Progressive based on following criteria: GI, ENDOCRINE disease patients with GI bleeding, acute liver failure or pancreatitis, stable with diabetic ketoacidosis or thyrotoxicosis (hypothyroid) state.  May admit patient to [facility] or [facility] if equivalent level of care is available:: No  Covid Evaluation: Asymptomatic - no recent exposure (last 10 days) testing not required  Diagnosis: Lower GI bleed [782956]  Admitting Physician: Ethie Curless [[id number]]  Attending Physician: Analys Ryden [[id number]]  Certification:: I certify this patient will need inpatient services for at least 2 midnights  Expected Medical Readiness: 12/17/2023            inpatient     I Expect 2 midnight stay secondary to severity of patient's current illness need for inpatient interventions justified by the following:  hemodynamic instability despite optimal treatment (tachycardia  hypotension )   Severe lab/radiological/exam abnormalities including:   Lower GI bleed Symptomatic anemia, hemorrhagic shock  and extensive comorbidities including:  DM2   Chronic anticoagulation  That are currently affecting medical management.   I expect  patient to be hospitalized for 2 midnights requiring inpatient medical care.  Patient is at high risk for  adverse outcome (such as loss of life or disability) if not treated.  Indication for inpatient stay as follows:    Hemodynamic instability despite maximal medical therapy,     Need for operative/procedural  intervention    Need for , IV fluids,     Level of care        stepdwon   Emmalyn Hinson 12/13/2023, 8:13 PM    [facility]     after 2 AM please page floor coverage   If 7AM-7PM, please contact the day team taking care of the patient using [email or web address]

## 2023-12-13 NOTE — ED Triage Notes (Signed)
 Patient bib gcems for bloody stools states that she has had this to happened in the past with diverticulitis denies abdominal pain. Denies n/v. Paitent is on warfarin.

## 2023-12-13 NOTE — ED Notes (Signed)
 ED TO INPATIENT HANDOFF REPORT  ED Nurse Name and Phone #: Lindsay Rho Name/Age/Gender Zoe Cobb 68 y.o. female Room/Bed: WA12/WA12  Code Status   Code Status: Prior  Home/SNF/Other Home Patient oriented to: self, place, time, and situation Is this baseline? Yes   Triage Complete: Triage complete  Chief Complaint Lower GI bleed [K92.2]  Triage Note Patient bib gcems for bloody stools states that she has had this to happened in the past with diverticulitis denies abdominal pain. Denies n/v. Paitent is on warfarin.     Allergies Allergies  Allergen Reactions   Penicillins Swelling    Level of Care/Admitting Diagnosis ED Disposition     ED Disposition  Admit   Condition  --   Comment  Hospital Area: [facility] [[id number]]  Level of Care: Progressive [102]  Admit to Progressive based on following criteria: GI, ENDOCRINE disease patients with GI bleeding, acute liver failure or pancreatitis, stable with diabetic ketoacidosis or thyrotoxicosis (hypothyroid) state.  May admit patient to [facility] or [facility] if equivalent level of care is available:: No  Covid Evaluation: Asymptomatic - no recent exposure (last 10 days) testing not required  Diagnosis: Lower GI bleed [782956]  Admitting Physician: DOUTOVA, ANASTASSIA [[id number]]  Attending Physician: DOUTOVA, ANASTASSIA [[id number]]  Certification:: I certify this patient will need inpatient services for at least 2 midnights  Expected Medical Readiness: 12/17/2023          B Medical/Surgery History Past Medical History:  Diagnosis Date   Arthritis    CHF (congestive heart failure) (HCC)    Diabetes mellitus without complication (HCC)    metformin   Dysrhythmia    A-fib   Hypertension    Morbid obesity (HCC)    Multiple sclerosis (HCC)    asymptomatic   Paroxysmal atrial fibrillation [facility])    Past Surgical History:  Procedure Laterality Date   COLONOSCOPY WITH PROPOFOL  N/A 12/15/2020    Procedure: COLONOSCOPY WITH PROPOFOL ;  Surgeon: Asencion Blacksmith, MD;  Location: [facility] ENDOSCOPY;  Service: Endoscopy;  Laterality: N/A;   DILATATION & CURETTAGE/HYSTEROSCOPY WITH MYOSURE N/A 04/04/2023   Procedure: DILATATION & CURETTAGE/HYSTEROSCOPY WITH MYOSURE;  Surgeon: Suzi Essex, MD;  Location: [facility];  Service: Gynecology;  Laterality: N/A;   HYSTEROSCOPY WITH D & C N/A 03/12/2023   Procedure: DIAGNOSTIC HYSTEROSCOPY, ENDOMETRIAL BIOPSY;  Surgeon: Kiki Pelton, MD;  Location: [facility];  Service: Gynecology;  Laterality: N/A;   INTRAUTERINE DEVICE (IUD) INSERTION N/A 04/04/2023   Procedure: INTRAUTERINE DEVICE (IUD) INSERTION MIRENA , TILT TEST;  Surgeon: Suzi Essex, MD;  Location: [facility];  Service: Gynecology;  Laterality: N/A;     A IV Location/Drains/Wounds Patient Lines/Drains/Airways Status     Active Line/Drains/Airways     Name Placement date Placement time Site Days   Peripheral IV 12/13/23 20 G Posterior;Right Hand 12/13/23  1023  Hand  less than 1   Peripheral IV 12/13/23 20 G 1.88" Anterior;Proximal;Right Forearm 12/13/23  1442  Forearm  less than 1            Intake/Output Last 24 hours No intake or output data in the 24 hours ending 12/13/23 1918  Labs/Imaging Results for orders placed or performed during the hospital encounter of 12/13/23 (from the past 48 hours)  CBC with Differential     Status: Abnormal   Collection Time: 12/13/23 10:18 AM  Result Value Ref Range   WBC 8.8 4.0 - 10.5 K/uL   RBC 4.16 3.87 - 5.11 MIL/uL  Hemoglobin 9.1 (L) 12.0 - 15.0 g/dL   HCT 91.4 (L) 78.2 - 95.6 %   MCV 77.2 (L) 80.0 - 100.0 fL   MCH 21.9 (L) 26.0 - 34.0 pg   MCHC 28.3 (L) 30.0 - 36.0 g/dL   RDW 21.3 (H) 08.6 - 57.8 %   Platelets 328 150 - 400 K/uL   nRBC 0.0 0.0 - 0.2 %   Neutrophils Relative % 68 %   Neutro Abs 6.0 1.7 - 7.7 K/uL   Lymphocytes Relative 21 %   Lymphs Abs 1.9 0.7 - 4.0 K/uL   Monocytes Relative 7 %   Monocytes Absolute 0.6 0.1 -  1.0 K/uL   Eosinophils Relative 2 %   Eosinophils Absolute 0.2 0.0 - 0.5 K/uL   Basophils Relative 1 %   Basophils Absolute 0.0 0.0 - 0.1 K/uL   Immature Granulocytes 1 %   Abs Immature Granulocytes 0.05 0.00 - 0.07 K/uL    Comment: Performed at [facility], [address], [place], [place] [address]  Comprehensive metabolic panel     Status: Abnormal   Collection Time: 12/13/23 10:18 AM  Result Value Ref Range   Sodium 138 135 - 145 mmol/L   Potassium 4.8 3.5 - 5.1 mmol/L   Chloride 105 98 - 111 mmol/L   CO2 23 22 - 32 mmol/L   Glucose, Bld 121 (H) 70 - 99 mg/dL    Comment: Glucose reference range applies only to samples taken after fasting for at least 8 hours.   BUN 33 (H) 8 - 23 mg/dL   Creatinine, Ser 9.52 0.44 - 1.00 mg/dL   Calcium 8.4 (L) 8.9 - 10.3 mg/dL   Total Protein 8.0 6.5 - 8.1 g/dL   Albumin 2.6 (L) 3.5 - 5.0 g/dL   AST 26 15 - 41 U/L    Comment: HEMOLYSIS AT THIS LEVEL MAY AFFECT RESULT   ALT 14 0 - 44 U/L    Comment: HEMOLYSIS AT THIS LEVEL MAY AFFECT RESULT   Alkaline Phosphatase 78 38 - 126 U/L   Total Bilirubin 1.1 0.0 - 1.2 mg/dL    Comment: HEMOLYSIS AT THIS LEVEL MAY AFFECT RESULT   GFR, Estimated >60 >60 mL/min    Comment: (NOTE) Calculated using the CKD-EPI Creatinine Equation (2021)    Anion gap 10 5 - 15    Comment: Performed at [facility], [address] [place], [place] [address]  Lipase, blood     Status: None   Collection Time: 12/13/23 10:18 AM  Result Value Ref Range   Lipase 33 11 - 51 U/L    Comment: Performed at [facility], [address], [place], [place] [address]  Protime-INR     Status: Abnormal   Collection Time: 12/13/23 10:18 AM  Result Value Ref Range   Prothrombin  Time 34.1 (H) 11.4 - 15.2 seconds   INR 3.3 (H) 0.8 - 1.2    Comment: (NOTE) INR goal varies based on device and disease states. Performed at [facility], [address], [place], [place] [address]   Type and screen [facility]     Status: None (Preliminary result)   Collection Time: 12/13/23  6:20 PM  Result Value Ref Range   ABO/RH(D) PENDING    Antibody Screen PENDING    Sample Expiration      12/16/2023,2359 Performed at [facility], [address], [place], [place] [address]   Prepare fresh frozen plasma  Status: None (Preliminary result)   Collection Time: 12/13/23  6:25 PM  Result Value Ref Range   Unit Number [id number]    Blood Component Type FP24 PHR THW    Unit division B0    Status of Unit ALLOCATED    Transfusion Status      OK TO TRANSFUSE Performed at [facility], [address], [place], [place] [address]    CT ANGIO GI BLEED Result Date: 12/13/2023 CLINICAL DATA:  Lower GI bleed.  Bloody stools. EXAM: CTA ABDOMEN AND PELVIS WITHOUT AND WITH CONTRAST TECHNIQUE: Multidetector CT imaging of the abdomen and pelvis was performed using the standard protocol during bolus administration of intravenous contrast. Multiplanar reconstructed images and MIPs were obtained and reviewed to evaluate the vascular anatomy. RADIATION DOSE REDUCTION: This exam was performed according to the departmental dose-optimization program which includes automated exposure control, adjustment of the mA and/or kV according to patient size and/or use of iterative reconstruction technique. CONTRAST:  OMNIPAQUE  IOHEXOL  350 MG/ML SOLN COMPARISON:  CT abdomen pelvis dated 04/07/2023. FINDINGS: Evaluation is limited due to body habitus and severe streak artifact caused by patient's arms. VASCULAR Aorta: Mild atherosclerotic calcification. No aneurysmal dilatation or dissection. No periaortic fluid collection. Celiac: The celiac trunk and its major branches appear patent. SMA: The SMA is patent. Renals: The renal arteries appear patent. IMA: The IMA is patent. Inflow: Mild atherosclerotic calcification of the  iliac arteries. The iliac arteries are patent. No aneurysmal dilatation or dissection. Proximal Outflow: The visualized proximal outflow is patent. Veins: The IVC is unremarkable. No portal venous gas. The SMV, splenic vein, and main portal vein are patent. Review of the MIP images confirms the above findings. NON-VASCULAR Lower chest: The visualized lung bases are clear. No intra-abdominal free air or free fluid. Hepatobiliary: The liver is unremarkable. No biliary dilatation. Multiple gallstones. No pericholecystic fluid or evidence of acute cholecystitis by CT. Pancreas: Unremarkable. No pancreatic ductal dilatation or surrounding inflammatory changes. Spleen: Normal in size without focal abnormality. Adrenals/Urinary Tract: The adrenal glands unremarkable. Mild bilateral renal parenchyma atrophy. Indeterminate bilateral renal hypodense lesions not characterized on this CT. There is no hydronephrosis on either side. The urinary bladder is grossly unremarkable. Stomach/Bowel: There is sigmoid diverticulosis with muscular hypertrophy. Contrast pooling in the sigmoid colon consistent with diverticular bleed. There is no bowel obstruction. The appendix is normal. Lymphatic: Mild aortoiliac atherosclerotic disease. The IVC is unremarkable. No portal venous gas. There is no adenopathy. Reproductive: The uterus is anteverted. Two intrauterine devices noted within the uterus, one of the which appears low in position. Other: None Musculoskeletal: Degenerative changes of the spine. IMPRESSION: 1. Active diverticular bleed of the sigmoid colon. 2. Cholelithiasis. 3. Two IUD with one low-lying intrauterine device. 4.  Aortic Atherosclerosis (ICD10-I70.0). Electronically Signed   By: Angus Bark M.D.   On: 12/13/2023 17:13    Pending Labs Unresulted Labs (From admission, onward)     Start     Ordered   12/14/23 0500  Vitamin B12  (Anemia Panel (PNL))  Tomorrow morning,   R        12/13/23 1903   12/14/23 0500   Folate  (Anemia Panel (PNL))  Tomorrow morning,   R        12/13/23 1903   12/13/23 1914  Hemoglobin A1c  Add-on,   AD       Comments: To assess prior glycemic control    12/13/23 1913   12/13/23 1904  Iron and TIBC  (  Anemia Panel (PNL))  Add-on,   AD        12/13/23 1903   12/13/23 1904  Ferritin  (Anemia Panel (PNL))  Add-on,   AD        12/13/23 1903   12/13/23 1904  Reticulocytes  (Anemia Panel (PNL))  Add-on,   AD        12/13/23 1903   12/13/23 1904  CK  Add-on,   AD        12/13/23 1903   12/13/23 1904  Magnesium   Add-on,   AD        12/13/23 1903   12/13/23 1904  Phosphorus  Add-on,   AD        12/13/23 1903   12/13/23 1903  CBC  Now then every 6 hours,   R (with TIMED occurrences)     Question:  Release to patient  Answer:  Immediate   12/13/23 1902   12/13/23 1903  Protime-INR  Now then every 12 hours,   R (with TIMED occurrences)      12/13/23 1903   12/13/23 1005  Urinalysis, Routine w reflex microscopic -Urine, Clean Catch  (ED Abdominal Pain)  Once,   URGENT       Question:  Specimen Source  Answer:  Urine, Clean Catch   12/13/23 1004            Vitals/Pain Today's Vitals   12/13/23 1745 12/13/23 1800 12/13/23 1815 12/13/23 1845  BP: 100/71 102/83 (!) 106/91 (!) 122/104  Pulse: (!) 114 (!) 112 (!) 110 (!) 115  Resp: 16 17 19 18   Temp:      TempSrc:      SpO2: 96% 99% 96% 94%  Weight:      Height:      PainSc:        Isolation Precautions No active isolations  Medications Medications  phytonadione  (VITAMIN K) 10 mg in dextrose  5 % 50 mL IVPB (10 mg Intravenous New Bag/Given 12/13/23 1835)  insulin  aspart (novoLOG ) injection 0-9 Units (has no administration in time range)  pantoprazole  (PROTONIX ) injection 40 mg (40 mg Intravenous Given 12/13/23 1131)  iohexol  (OMNIPAQUE ) 350 MG/ML injection 100 mL (100 mLs Intravenous Contrast Given 12/13/23 1629)  sodium chloride  0.9 % bolus 1,000 mL (1,000 mLs Intravenous New Bag/Given 12/13/23 1832)     Mobility non-ambulatory     Focused Assessments GI assessment   R Recommendations: See Admitting Provider Note  Report given to:   Additional Notes: admit for diverticular bleed - is receiving Vit K IV currently, has IR consulted for intervention which will be in the morning

## 2023-12-13 NOTE — Assessment & Plan Note (Signed)
 Contributing to comorbidity and complicating medical management  Body mass index is 76.52 kg/m.  Nutritional follow up as an out pt would be recommended

## 2023-12-13 NOTE — ED Notes (Signed)
2LNC applied 

## 2023-12-13 NOTE — Progress Notes (Signed)
 eLink Physician-Brief Progress Note Patient Name: Zoe Cobb DOB: 1955-10-01 MRN: [id number]   Date of Service  12/13/2023  HPI/Events of Note  Patient on Coumadin  anti-coagulation for atrial fibrillation who presented with lower GI bleeding and had a positive CT angio scan for diverticular bleeding.  eICU Interventions  New Patient Evaluation.        Ashyah Quizon U Doni Widmer 12/13/2023, 9:25 PM

## 2023-12-13 NOTE — ED Notes (Signed)
 Hospitalist at bedside

## 2023-12-13 NOTE — Consult Note (Signed)
 Reason for Consult: Lower GI bleeding Referring Physician: Hospital team  Zoe Cobb is an 68 y.o. female.  HPI: Patient is seen and examined in her hospital computer chart reviewed and she has had 2 bloody bowel movements all red bowel pain or any other GI symptoms at home she has a history of diverticular bleeding and her last colonoscopy was reviewed and she is on Coumadin  with increased INR she is not on any extra aspirin  or nonsteroidals family history is negative for any GI issues he has no other complaints and wants to eat and she is waiting for his CTA but they need an IV higher than her hand in order to proceed  Past Medical History:  Diagnosis Date   Arthritis    CHF (congestive heart failure) (HCC)    Diabetes mellitus without complication (HCC)    metformin   Dysrhythmia    A-fib   Hypertension    Morbid obesity (HCC)    Multiple sclerosis (HCC)    asymptomatic   Paroxysmal atrial fibrillation (HCC)     Past Surgical History:  Procedure Laterality Date   COLONOSCOPY WITH PROPOFOL  N/A 12/15/2020   Procedure: COLONOSCOPY WITH PROPOFOL ;  Surgeon: Asencion Blacksmith, MD;  Location: [facility] ENDOSCOPY;  Service: Endoscopy;  Laterality: N/A;   DILATATION & CURETTAGE/HYSTEROSCOPY WITH MYOSURE N/A 04/04/2023   Procedure: DILATATION & CURETTAGE/HYSTEROSCOPY WITH MYOSURE;  Surgeon: Suzi Essex, MD;  Location: [facility];  Service: Gynecology;  Laterality: N/A;   HYSTEROSCOPY WITH D & C N/A 03/12/2023   Procedure: DIAGNOSTIC HYSTEROSCOPY, ENDOMETRIAL BIOPSY;  Surgeon: Kiki Pelton, MD;  Location: [facility];  Service: Gynecology;  Laterality: N/A;   INTRAUTERINE DEVICE (IUD) INSERTION N/A 04/04/2023   Procedure: INTRAUTERINE DEVICE (IUD) INSERTION MIRENA , TILT TEST;  Surgeon: Suzi Essex, MD;  Location: [facility];  Service: Gynecology;  Laterality: N/A;    Family History  Problem Relation Age of Onset   Hypertension Mother    Diabetes Mother    Stomach cancer Father     Kidney disease Sister    Diabetes Sister    Diabetes Sister    Diabetes Sister    Heart attack Brother    HIV/AIDS Brother    Diabetes Brother    Prostate cancer Brother    Colon cancer Neg Hx    Breast cancer Neg Hx    Ovarian cancer Neg Hx    Endometrial cancer Neg Hx    Pancreatic cancer Neg Hx     Social History:  reports that she quit smoking about 29 years ago. Her smoking use included cigarettes. She started smoking about 38 years ago. She has a 9 pack-year smoking history. She has never used smokeless tobacco. She reports that she does not drink alcohol and does not use drugs.  Allergies:  Allergies  Allergen Reactions   Penicillins Swelling    Medications: I have reviewed the patient's current medications.  Results for orders placed or performed during the hospital encounter of 12/13/23 (from the past 48 hours)  CBC with Differential     Status: Abnormal   Collection Time: 12/13/23 10:18 AM  Result Value Ref Range   WBC 8.8 4.0 - 10.5 K/uL   RBC 4.16 3.87 - 5.11 MIL/uL   Hemoglobin 9.1 (L) 12.0 - 15.0 g/dL   HCT 40.9 (L) 81.1 - 91.4 %   MCV 77.2 (L) 80.0 - 100.0 fL   MCH 21.9 (L) 26.0 - 34.0 pg   MCHC 28.3 (L) 30.0 - 36.0 g/dL  RDW 18.6 (H) 11.5 - 15.5 %   Platelets 328 150 - 400 K/uL   nRBC 0.0 0.0 - 0.2 %   Neutrophils Relative % 68 %   Neutro Abs 6.0 1.7 - 7.7 K/uL   Lymphocytes Relative 21 %   Lymphs Abs 1.9 0.7 - 4.0 K/uL   Monocytes Relative 7 %   Monocytes Absolute 0.6 0.1 - 1.0 K/uL   Eosinophils Relative 2 %   Eosinophils Absolute 0.2 0.0 - 0.5 K/uL   Basophils Relative 1 %   Basophils Absolute 0.0 0.0 - 0.1 K/uL   Immature Granulocytes 1 %   Abs Immature Granulocytes 0.05 0.00 - 0.07 K/uL    Comment: Performed at [facility], [address] [place], [place] [address]  Comprehensive metabolic panel     Status: Abnormal   Collection Time: 12/13/23 10:18 AM  Result Value Ref Range   Sodium 138 135 - 145 mmol/L   Potassium  4.8 3.5 - 5.1 mmol/L   Chloride 105 98 - 111 mmol/L   CO2 23 22 - 32 mmol/L   Glucose, Bld 121 (H) 70 - 99 mg/dL    Comment: Glucose reference range applies only to samples taken after fasting for at least 8 hours.   BUN 33 (H) 8 - 23 mg/dL   Creatinine, Ser 6.04 0.44 - 1.00 mg/dL   Calcium 8.4 (L) 8.9 - 10.3 mg/dL   Total Protein 8.0 6.5 - 8.1 g/dL   Albumin 2.6 (L) 3.5 - 5.0 g/dL   AST 26 15 - 41 U/L    Comment: HEMOLYSIS AT THIS LEVEL MAY AFFECT RESULT   ALT 14 0 - 44 U/L    Comment: HEMOLYSIS AT THIS LEVEL MAY AFFECT RESULT   Alkaline Phosphatase 78 38 - 126 U/L   Total Bilirubin 1.1 0.0 - 1.2 mg/dL    Comment: HEMOLYSIS AT THIS LEVEL MAY AFFECT RESULT   GFR, Estimated >60 >60 mL/min    Comment: (NOTE) Calculated using the CKD-EPI Creatinine Equation (2021)    Anion gap 10 5 - 15    Comment: Performed at [facility], [address], [place], [place] [address]  Lipase, blood     Status: None   Collection Time: 12/13/23 10:18 AM  Result Value Ref Range   Lipase 33 11 - 51 U/L    Comment: Performed at [facility], [address] [place], [place] [address]  Protime-INR     Status: Abnormal   Collection Time: 12/13/23 10:18 AM  Result Value Ref Range   Prothrombin  Time 34.1 (H) 11.4 - 15.2 seconds   INR 3.3 (H) 0.8 - 1.2    Comment: (NOTE) INR goal varies based on device and disease states. Performed at [facility], [address], [place], [place] [address]     No results found.  Review of Systems negative except above Blood pressure 111/67, pulse 99, temperature 97.8 F (36.6 C), temperature source Oral, resp. rate 16, height 5\' 1"  (1.549 m), weight (!) 183.7 kg, SpO2 97%. Physical Exam vital signs stable afebrile no acute distress abdomen is soft nontender slight increase slight decrease hemoglobin over baseline MCV low INR 3.3 CTA pending  Assessment/Plan: Almost certainly diverticular bleeding and patient  over anticoagulated Plan: Okay with me to hold off on CTA however if bleeding increases will need that or consider nuclear bleeding scan if IVR problem and would give FFP but hold vitamin K for now and will allow clear  liquids and if nuclear scan or CTA positive obviously consult IR and we will check on tomorrow and at home you will need to reevaluate blood thinner needs  Jewelene Mairena E 12/13/2023, 2:09 PM

## 2023-12-13 NOTE — Subjective & Objective (Signed)
 Patient presents with painless bloody stools states had the same with diverticulitis in the past.  No nausea no vomiting no abdominal pain.  She is on Coumadin  for history of A-fib.  Reports dark blood mixed with clots since yesterday No pain with defecation INR found to be elevated at 3.3 Given a dose of FFP and vitamin K [facility] was consulted CT angio showed active diverticular bleed of the sigmoid colon and IR has been consulted will see patient in consult

## 2023-12-13 NOTE — ED Notes (Signed)
 Pt beginning to get hypotensive, and more tachycardic - has just had a large episode of bloody output appearing to come from vagina, but mixed with stool. Dr. Hendrick Locke notified, asked to come reassess.

## 2023-12-13 NOTE — Assessment & Plan Note (Addendum)
 Given a dose of FFP and Vitamin K and K centra Follow INR

## 2023-12-13 NOTE — Assessment & Plan Note (Addendum)
-   Suspect Lower Gi source  - Admit  For further management given:  Age >60 years,  comorbid illnesses  , gross rectal bleeding exposure to   anticoagulants.  -  most likely Diverticular source -  painless bleeding making colitis less likely      -  ER  Provider spoke to gastroenterology ( [facility], Dr. Lavaughn Portland ) they will see patient in a.m. Also discussed case with IR will see pt in consult appreciate their consult   - serial CBC.    - Monitor for any recurrence,  evidence of hemodynamic instability or significant blood loss -  type and screen,  - Transfuse as needed for hemoglobin below 7 or <9 if evidence of significant  bleeding  - Establish at least 2 PIV and fluid resuscitate   -  keep nothing by mouth    -  monitor for Recurrent significant  Bleeding     8:03 PM PT with massive blood BM hypotensive and tachycardic IR has been consulted will take pt for embolization now Will give 1 unit blood now and may need more to go If has persistent bleeding  If continues to be hemodynamically unstable will need PCCM consult

## 2023-12-13 NOTE — Assessment & Plan Note (Signed)
 Hold Coumadin  given GI bleed once blood pressure stable will restart Toprol 

## 2023-12-13 NOTE — ED Provider Notes (Signed)
 [facility] DE[facility]MENT AT [facility] Provider Note  CSN: [id number] Arrival date & time: 12/13/23 4098  Chief Complaint(s) Rectal Bleeding  HPI Zoe Cobb is a 68 y.o. female with past medical history as below, significant for CHF, DM, afib on WARFARIN, obesity, hepatitis c who presents to the ED with complaint of rectal bleeding  Patient reports dark blood mixed with some clots in her stool since yesterday.  No pain with defecation.  No abdominal pain.  No vomiting, no hematemesis.  No rashes.  She is compliant with her warfarin.  No change urination, no blood in her urine.  No blood between bowel movements.  Only having bleeding with defecation.  Similar symptoms in the past attributed to diverticulitis per patient  Past Medical History Past Medical History:  Diagnosis Date   Arthritis    CHF (congestive heart failure) (HCC)    Diabetes mellitus without complication (HCC)    metformin   Dysrhythmia    A-fib   Hypertension    Morbid obesity (HCC)    Multiple sclerosis (HCC)    asymptomatic   Paroxysmal atrial fibrillation [facility])    Patient Active Problem List   Diagnosis Date Noted   Acute blood loss anemia 04/07/2023   Endometrial cancer (HCC) 03/23/2023   Postmenopausal bleeding 03/12/2023   Hypocalcemia 03/01/2023   Diverticulitis of colon with bleeding 11/07/2022   Ambulatory dysfunction 11/07/2022   Cholelithiasis 11/07/2022   Supratherapeutic INR 09/10/2022   Acute GI bleeding 09/09/2022   Vaginal bleeding 11/01/2021   Hyperproteinemia 11/01/2021   Hypoalbuminemia 11/01/2021   Chronic hepatitis C (HCC) 08/25/2021   Essential hypertension 08/25/2021   Diverticulosis of colon 12/15/2020   Acute blood loss anemia on chronic iron deficiency anemia 12/13/2020   GI bleed 12/12/2020   Anemia    Hematochezia    Lower abdominal pain    Atrial fibrillation (HCC) 10/31/2018   Long term (current) use of anticoagulants 10/05/2018   Peripheral  edema    Thrombocytopenia (HCC)    Chronic diastolic (congestive) heart failure (HCC) 09/27/2016   Swelling of left lower extremity 09/27/2016   Paroxysmal atrial fibrillation (HCC) 09/27/2016   AKI (acute kidney injury) (HCC) 09/27/2016   Shortness of breath 08/28/2016   Pressure injury of skin 08/28/2016   Morbid obesity with BMI of 70 and over, adult (HCC) 06/28/2015   Home Medication(s) Prior to Admission medications   Medication Sig Start Date End Date Taking? Authorizing Provider  acetaminophen  (TYLENOL ) 500 MG tablet Take 500 mg by mouth every 6 (six) hours as needed.   Yes [provider]  isosorbide mononitrate (IMDUR) 60 MG 24 hr tablet Take 60 mg by mouth daily. 12/04/23  Yes [provider]  levonorgestrel  (MIRENA ) 20 MCG/DAY IUD Intrauterine device to be placed in office. 10/03/23 12/13/23 Yes Cross, Melissa D, NP  metFORMIN (GLUCOPHAGE) 1000 MG tablet Take 1,000 mg by mouth 2 (two) times daily. 10/29/23  Yes [provider]  methocarbamol (ROBAXIN) 500 MG tablet Take 500 mg by mouth 2 (two) times daily as needed for muscle spasms. 09/12/23  Yes [provider]  metoprolol  succinate (TOPROL -XL) 50 MG 24 hr tablet Take 75 mg by mouth daily. 09/11/23  Yes [provider]  vitamin B-12 (CYANOCOBALAMIN ) 500 MCG tablet Take 500 mcg by mouth daily.   Yes [provider]  warfarin (COUMADIN ) 2.5 MG tablet Take 2.5mg  to 5mg  by mouth daily as directed by coumadin  clinic 11/22/23  Yes Knox Perl, MD  cefdinir  (OMNICEF ) 300  MG capsule Take 300 mg by mouth 2 (two) times daily. Patient not taking: Reported on 12/13/2023 11/19/23   [provider]                                                                                                                                    Past Surgical History Past Surgical History:  Procedure Laterality Date   COLONOSCOPY WITH PROPOFOL  N/A 12/15/2020   Procedure: COLONOSCOPY WITH PROPOFOL ;  Surgeon:  Asencion Blacksmith, MD;  Location: [facility] ENDOSCOPY;  Service: Endoscopy;  Laterality: N/A;   DILATATION & CURETTAGE/HYSTEROSCOPY WITH MYOSURE N/A 04/04/2023   Procedure: DILATATION & CURETTAGE/HYSTEROSCOPY WITH MYOSURE;  Surgeon: Suzi Essex, MD;  Location: [facility] ORS;  Service: Gynecology;  Laterality: N/A;   HYSTEROSCOPY WITH D & C N/A 03/12/2023   Procedure: DIAGNOSTIC HYSTEROSCOPY, ENDOMETRIAL BIOPSY;  Surgeon: Kiki Pelton, MD;  Location: [facility] ORS;  Service: Gynecology;  Laterality: N/A;   INTRAUTERINE DEVICE (IUD) INSERTION N/A 04/04/2023   Procedure: INTRAUTERINE DEVICE (IUD) INSERTION MIRENA , TILT TEST;  Surgeon: Suzi Essex, MD;  Location: [facility] ORS;  Service: Gynecology;  Laterality: N/A;   Family History Family History  Problem Relation Age of Onset   Hypertension Mother    Diabetes Mother    Stomach cancer Father    Kidney disease Sister    Diabetes Sister    Diabetes Sister    Diabetes Sister    Heart attack Brother    HIV/AIDS Brother    Diabetes Brother    Prostate cancer Brother    Colon cancer Neg Hx    Breast cancer Neg Hx    Ovarian cancer Neg Hx    Endometrial cancer Neg Hx    Pancreatic cancer Neg Hx     Social History Social History   Tobacco Use   Smoking status: Former    Current packs/day: 0.00    Average packs/day: 1 pack/day for 9.0 years (9.0 ttl pk-yrs)    Types: Cigarettes    Start date: 4    Quit date: 1996    Years since quitting: 29.3   Smokeless tobacco: Never  Vaping Use   Vaping status: Never Used  Substance Use Topics   Alcohol use: No    Alcohol/week: 0.0 standard drinks of alcohol   Drug use: No   Allergies Penicillins  Review of Systems A thorough review of systems was obtained and all systems are negative except as noted in the HPI and PMH.   Physical Exam Vital Signs  I have reviewed the triage vital signs BP (!) 102/58   Pulse (!) 106   Temp 97.8 F (36.6 C) (Oral)   Resp 18   Ht 5\' 1"  (1.549 m)   Wt (!)  183.7 kg   SpO2 94%   BMI 76.52 kg/m  Physical Exam Vitals and nursing note reviewed.  Constitutional:      General: She is not in acute  distress.    Appearance: Normal appearance. She is obese.  HENT:     Head: Normocephalic and atraumatic.     Right Ear: External ear normal.     Left Ear: External ear normal.     Nose: Nose normal.     Mouth/Throat:     Mouth: Mucous membranes are moist.  Eyes:     General: No scleral icterus.       Right eye: No discharge.        Left eye: No discharge.  Cardiovascular:     Rate and Rhythm: Normal rate and regular rhythm.     Pulses: Normal pulses.     Heart sounds: Normal heart sounds.  Pulmonary:     Effort: Pulmonary effort is normal. No respiratory distress.     Breath sounds: Normal breath sounds. No stridor.  Abdominal:     General: Abdomen is flat. There is no distension.     Palpations: Abdomen is soft.     Tenderness: There is no abdominal tenderness.  Musculoskeletal:     Cervical back: No rigidity.     Right lower leg: No edema.     Left lower leg: No edema.  Skin:    General: Skin is warm and dry.     Capillary Refill: Capillary refill takes less than 2 seconds.  Neurological:     Mental Status: She is alert.  Psychiatric:        Mood and Affect: Mood normal.        Behavior: Behavior normal. Behavior is cooperative.     ED Results and Treatments Labs (all labs ordered are listed, but only abnormal results are displayed) Labs Reviewed  CBC WITH DIFFERENTIAL/PLATELET - Abnormal; Notable for the following components:      Result Value   Hemoglobin 9.1 (*)    HCT 32.1 (*)    MCV 77.2 (*)    MCH 21.9 (*)    MCHC 28.3 (*)    RDW 18.6 (*)    All other components within normal limits  COMPREHENSIVE METABOLIC PANEL WITH GFR - Abnormal; Notable for the following components:   Glucose, Bld 121 (*)    BUN 33 (*)    Calcium 8.4 (*)    Albumin 2.6 (*)    All other components within normal limits  PROTIME-INR -  Abnormal; Notable for the following components:   Prothrombin  Time 34.1 (*)    INR 3.3 (*)    All other components within normal limits  LIPASE, BLOOD  URINALYSIS, ROUTINE W REFLEX MICROSCOPIC                                                                                                                          Radiology No results found.  Pertinent labs & imaging results that were available during my care of the patient were reviewed by me and considered in my medical decision making (see MDM for details).  Medications Ordered in ED Medications  pantoprazole  (  PROTONIX ) injection 40 mg (40 mg Intravenous Given 12/13/23 1131)  iohexol  (OMNIPAQUE ) 350 MG/ML injection 100 mL (100 mLs Intravenous Contrast Given 12/13/23 1629)                                                                                                                                     Procedures Procedures  (including critical care time)  Medical Decision Making / ED Course    Medical Decision Making:    Duana Lautt is a 68 y.o. female with past medical history as below, significant for CHF, DM, afib on WARFARIN, obesity, hepatitis c who presents to the ED with complaint of rectal bleeding. The complaint involves an extensive differential diagnosis and also carries with it a high risk of complications and morbidity.  Serious etiology was considered. Ddx includes but is not limited to: Hemorrhoid external versus internal, diverticulosis, diverticulitis, AVM, upper GI bleed, gastric ulcer, etc.  Complete initial physical exam performed, notably the patient was in distress, resting comfortably on stretcher.    Reviewed and confirmed nursing documentation for past medical history, family history, social history.  Vital signs reviewed.     Brief summary:  68 year old female history A-fib on warfarin, obesity, diverticulitis here with blood in her stool.  Dark red blood mixed with some clots mixed with her  stool. No abdominal pain.   Clinical Course as of 12/13/23 1707  Thu Dec 13, 2023  1005 Echo 1/18 with LVEF 55 to 60%, G2 DD  INR 4/30 was 2.9 [SG]  1222 Frank blood noted on exam, habitus does limit ability to visualize rectal opening.  [SG]  1613 Delay in imaging as pt refused imaging initially, is now agreeable  [SG]    Clinical Course User Index [SG] Russella Courts A, DO   Pt here with presumed rectal bleeding, she is anticoagulated on warfarin for afib, hgb mild drop from prior. Pending CT. I spoke w/ Dr Lavaughn Portland, will see in consult. Handoff incoming EDP pending CT and admit              Additional history obtained: -Additional history obtained from na -External records from outside source obtained and reviewed including: Chart review including previous notes, labs, imaging, consultation notes including  Prior labs and imaging, medications   Lab Tests: -I ordered, reviewed, and interpreted labs.   The pertinent results include:   Labs Reviewed  CBC WITH DIFFERENTIAL/PLATELET - Abnormal; Notable for the following components:      Result Value   Hemoglobin 9.1 (*)    HCT 32.1 (*)    MCV 77.2 (*)    MCH 21.9 (*)    MCHC 28.3 (*)    RDW 18.6 (*)    All other components within normal limits  COMPREHENSIVE METABOLIC PANEL WITH GFR - Abnormal; Notable for the following components:   Glucose, Bld 121 (*)    BUN 33 (*)    Calcium 8.4 (*)  Albumin 2.6 (*)    All other components within normal limits  PROTIME-INR - Abnormal; Notable for the following components:   Prothrombin  Time 34.1 (*)    INR 3.3 (*)    All other components within normal limits  LIPASE, BLOOD  URINALYSIS, ROUTINE W REFLEX MICROSCOPIC    Notable for hgb mild reduced, inr elev from prior   EKG   EKG Interpretation Date/Time:    Ventricular Rate:    PR Interval:    QRS Duration:    QT Interval:    QTC Calculation:   R Axis:      Text Interpretation:           Imaging Studies  ordered: I ordered imaging studies including CTA abd/pelv   Medicines ordered and prescription drug management: Meds ordered this encounter  Medications   pantoprazole  (PROTONIX ) injection 40 mg   iohexol  (OMNIPAQUE ) 350 MG/ML injection 100 mL    -I have reviewed the patients home medicines and have made adjustments as needed   Consultations Obtained: I requested consultation with the dr Lavaughn Portland,  and discussed lab and imaging findings as well as pertinent plan - they recommend: will see   Cardiac Monitoring: The patient was maintained on a cardiac monitor.  I personally viewed and interpreted the cardiac monitored which showed an underlying rhythm of: nsr Continuous pulse oximetry interpreted by myself, 96% on RA.    Social Determinants of Health:  Diagnosis or treatment significantly limited by social determinants of health: obesity   Reevaluation: After the interventions noted above, I reevaluated the patient and found that they have stayed the same  Co morbidities that complicate the patient evaluation  Past Medical History:  Diagnosis Date   Arthritis    CHF (congestive heart failure) (HCC)    Diabetes mellitus without complication (HCC)    metformin   Dysrhythmia    A-fib   Hypertension    Morbid obesity (HCC)    Multiple sclerosis (HCC)    asymptomatic   Paroxysmal atrial fibrillation (HCC)       Dispostion: Disposition decision including need for hospitalization was considered, and patient disposition pending at time of sign out.    Final Clinical Impression(s) / ED Diagnoses Final diagnoses:  Rectal bleeding  Anticoagulated on Coumadin         Teddi Favors, DO 12/13/23 1708

## 2023-12-13 NOTE — Procedures (Signed)
 Interventional Radiology Procedure Note  Procedure:   US  guided right CFA access. Mesenteric angiogram, including IMA, with superselective catheter position into multiple sigmoid colon branches.  Provocative angio of the target artery, with 100mcg nitroglycerine IA, negative  No target identified to embolize.   Findings:  Negative angio  Complications: None  Recommendations:  - right hip straight x 1 hr - continue resuscitation - continue serial H/H - expect passage of further melena for 24-48 hours as clears.  Will need to observe vital signs for identification of further hemorrhage, and will likely need repeat CTA if hypotensive/tachycardic, now that Kcentra /vit K has been given.  - Do not submerge for 7 days    Signed,  Vikki Gains S. Mabel Savage, DO

## 2023-12-13 NOTE — ED Notes (Signed)
 IVF opened to bolus at this time

## 2023-12-13 NOTE — Progress Notes (Signed)
 Interventional Radiology Progress Note  68 yo female with lower GI hemorrhage.   After initial eval/consult, patient became hemodynamically unstable.   Lowest BP = 80/42, current 100/70.  Risks and benefits of mesenterica angiogram, possible embolization were again discussed with the patient including, but not limited to bleeding, infection, vascular injury non target embolization, contrast induced renal failure.  All of the patient's questions were answered, patient is agreeable to proceed.  Consent signed and in chart.  Plan to proceed with angiogram.

## 2023-12-14 ENCOUNTER — Other Ambulatory Visit: Payer: Self-pay

## 2023-12-14 DIAGNOSIS — K922 Gastrointestinal hemorrhage, unspecified: Secondary | ICD-10-CM | POA: Diagnosis not present

## 2023-12-14 HISTORY — PX: IR RADIOLOGIST EVAL & MGMT: IMG5224

## 2023-12-14 LAB — COMPREHENSIVE METABOLIC PANEL WITH GFR
ALT: 12 U/L (ref 0–44)
AST: 16 U/L (ref 15–41)
Albumin: 2.4 g/dL — ABNORMAL LOW (ref 3.5–5.0)
Alkaline Phosphatase: 72 U/L (ref 38–126)
Anion gap: 11 (ref 5–15)
BUN: 32 mg/dL — ABNORMAL HIGH (ref 8–23)
CO2: 22 mmol/L (ref 22–32)
Calcium: 8 mg/dL — ABNORMAL LOW (ref 8.9–10.3)
Chloride: 104 mmol/L (ref 98–111)
Creatinine, Ser: 0.56 mg/dL (ref 0.44–1.00)
GFR, Estimated: >60 mL/min (ref >60)
Glucose, Bld: 134 mg/dL — ABNORMAL HIGH (ref 70–99)
Potassium: 3.5 mmol/L (ref 3.5–5.1)
Sodium: 137 mmol/L (ref 135–145)
Total Bilirubin: 0.6 mg/dL (ref 0.0–1.2)
Total Protein: 7.2 g/dL (ref 6.5–8.1)

## 2023-12-14 LAB — MAGNESIUM: Magnesium: 1.9 mg/dL (ref 1.7–2.4)

## 2023-12-14 LAB — CBC
HCT: 31.5 % — ABNORMAL LOW (ref 36.0–46.0)
HCT: 28.6 % — ABNORMAL LOW (ref 36.0–46.0)
HCT: 28.1 % — ABNORMAL LOW (ref 36.0–46.0)
HCT: 27.1 % — ABNORMAL LOW (ref 36.0–46.0)
HCT: 26.5 % — ABNORMAL LOW (ref 36.0–46.0)
Hemoglobin: 9.6 g/dL — ABNORMAL LOW (ref 12.0–15.0)
Hemoglobin: 8.6 g/dL — ABNORMAL LOW (ref 12.0–15.0)
Hemoglobin: 8.5 g/dL — ABNORMAL LOW (ref 12.0–15.0)
Hemoglobin: 8.2 g/dL — ABNORMAL LOW (ref 12.0–15.0)
Hemoglobin: 7.7 g/dL — ABNORMAL LOW (ref 12.0–15.0)
MCH: 23.6 pg — ABNORMAL LOW (ref 26.0–34.0)
MCH: 22.9 pg — ABNORMAL LOW (ref 26.0–34.0)
MCH: 23.2 pg — ABNORMAL LOW (ref 26.0–34.0)
MCH: 23.1 pg — ABNORMAL LOW (ref 26.0–34.0)
MCH: 23 pg — ABNORMAL LOW (ref 26.0–34.0)
MCHC: 30.5 g/dL (ref 30.0–36.0)
MCHC: 30.1 g/dL (ref 30.0–36.0)
MCHC: 30.2 g/dL (ref 30.0–36.0)
MCHC: 30.3 g/dL (ref 30.0–36.0)
MCHC: 29.1 g/dL — ABNORMAL LOW (ref 30.0–36.0)
MCV: 77.4 fL — ABNORMAL LOW (ref 80.0–100.0)
MCV: 76.1 fL — ABNORMAL LOW (ref 80.0–100.0)
MCV: 76.6 fL — ABNORMAL LOW (ref 80.0–100.0)
MCV: 76.3 fL — ABNORMAL LOW (ref 80.0–100.0)
MCV: 79.1 fL — ABNORMAL LOW (ref 80.0–100.0)
Platelets: 302 10*3/uL (ref 150–400)
Platelets: 301 10*3/uL (ref 150–400)
Platelets: 299 10*3/uL (ref 150–400)
Platelets: 293 10*3/uL (ref 150–400)
Platelets: 258 10*3/uL (ref 150–400)
RBC: 4.07 MIL/uL (ref 3.87–5.11)
RBC: 3.76 MIL/uL — ABNORMAL LOW (ref 3.87–5.11)
RBC: 3.67 MIL/uL — ABNORMAL LOW (ref 3.87–5.11)
RBC: 3.55 MIL/uL — ABNORMAL LOW (ref 3.87–5.11)
RBC: 3.35 MIL/uL — ABNORMAL LOW (ref 3.87–5.11)
RDW: 18.2 % — ABNORMAL HIGH (ref 11.5–15.5)
RDW: 17.8 % — ABNORMAL HIGH (ref 11.5–15.5)
RDW: 18.1 % — ABNORMAL HIGH (ref 11.5–15.5)
RDW: 18.3 % — ABNORMAL HIGH (ref 11.5–15.5)
RDW: 18.4 % — ABNORMAL HIGH (ref 11.5–15.5)
WBC: 25.4 10*3/uL — ABNORMAL HIGH (ref 4.0–10.5)
WBC: 23.4 10*3/uL — ABNORMAL HIGH (ref 4.0–10.5)
WBC: 17.9 10*3/uL — ABNORMAL HIGH (ref 4.0–10.5)
WBC: 15.8 10*3/uL — ABNORMAL HIGH (ref 4.0–10.5)
WBC: 13.1 10*3/uL — ABNORMAL HIGH (ref 4.0–10.5)
nRBC: 0 % (ref 0.0–0.2)
nRBC: 0 % (ref 0.0–0.2)
nRBC: 0 % (ref 0.0–0.2)
nRBC: 0 % (ref 0.0–0.2)
nRBC: 0 % (ref 0.0–0.2)

## 2023-12-14 LAB — PROTIME-INR
INR: 1.3 — ABNORMAL HIGH (ref 0.8–1.2)
INR: 1.3 — ABNORMAL HIGH (ref 0.8–1.2)
Prothrombin Time: 16.8 s — ABNORMAL HIGH (ref 11.4–15.2)
Prothrombin Time: 16.2 s — ABNORMAL HIGH (ref 11.4–15.2)

## 2023-12-14 LAB — PREPARE FRESH FROZEN PLASMA

## 2023-12-14 LAB — URINALYSIS, ROUTINE W REFLEX MICROSCOPIC
Bacteria, UA: NONE SEEN
Bilirubin Urine: NEGATIVE
Glucose, UA: NEGATIVE mg/dL
Ketones, ur: NEGATIVE mg/dL
Leukocytes,Ua: NEGATIVE
Nitrite: NEGATIVE
Protein, ur: 30 mg/dL — AB
Specific Gravity, Urine: 1.041 — ABNORMAL HIGH (ref 1.005–1.030)
pH: 5 (ref 5.0–8.0)

## 2023-12-14 LAB — GLUCOSE, CAPILLARY
Glucose-Capillary: 140 mg/dL — ABNORMAL HIGH (ref 70–99)
Glucose-Capillary: 128 mg/dL — ABNORMAL HIGH (ref 70–99)
Glucose-Capillary: 149 mg/dL — ABNORMAL HIGH (ref 70–99)
Glucose-Capillary: 150 mg/dL — ABNORMAL HIGH (ref 70–99)
Glucose-Capillary: 196 mg/dL — ABNORMAL HIGH (ref 70–99)

## 2023-12-14 LAB — BPAM FFP
Blood Product Expiration Date: 202505091857
ISSUE DATE / TIME: 202505081958
Unit Type and Rh: 6200

## 2023-12-14 LAB — FOLATE: Folate: 7.5 ng/mL (ref >5.9)

## 2023-12-14 LAB — HIV ANTIBODY (ROUTINE TESTING W REFLEX): HIV Screen 4th Generation wRfx: NONREACTIVE

## 2023-12-14 LAB — PHOSPHORUS: Phosphorus: 3.3 mg/dL (ref 2.5–4.6)

## 2023-12-14 LAB — VITAMIN B12: Vitamin B-12: 1085 pg/mL — ABNORMAL HIGH (ref 180–914)

## 2023-12-14 MED ORDER — METOPROLOL TARTRATE 5 MG/5ML IV SOLN: 2.5000 mg | Freq: Three times a day (TID) | INTRAVENOUS | Status: DC | PRN

## 2023-12-14 MED ORDER — BOOST / RESOURCE BREEZE PO LIQD CUSTOM
1.0000 | Freq: Three times a day (TID) | ORAL | Status: DC
  Administered 2023-12-15 (×2): 1 via ORAL

## 2023-12-14 MED ORDER — ORAL CARE MOUTH RINSE: 15.0000 mL | OROMUCOSAL | Status: DC | PRN

## 2023-12-14 NOTE — Progress Notes (Signed)
 Not able to give SS insulin  because of pt refusal. Pt wants metformin but it was explained to pt that MD does not recommend at this time due to need of IV contrast. Glucose ranges are 143-163 at this time. Dr Hendrick Locke aware of pt refusal.

## 2023-12-14 NOTE — Progress Notes (Signed)
 [facility] Progress Note  Patient: Zoe Cobb     ZOX:[id number]  DOA: 12/13/2023   PCP: Thomasene Flemings, NP       Brief hospital course: 68 year old female who is wheelchair-bound with atrial fibrillation, diverticular bleed, menopausal vaginal bleeding, anemia, diabetes mellitus, hepatitis C, HFpEF obesity presents to the hospital for painless bright red blood per rectum mixed with clots which started on 5/7. Admission time he began 9 in the afternoon In the ED: INR 3.3 secondary to warfarin.  Tachycardic CTA revealed diverticular bleed in the sigmoid colon.  Given FFP, vitamin K, IV pantoprazole , IV fluid boluses. IR consulted and recommended Kcentra .  Blood pressure dropped to 80/42.  Mesenteric angiogram performed but no target identified to embolize.   Subjective:  No further GI bleed.   Assessment and Plan: Principal Problem: Lower GI bleed -diverticular Acute blood loss anemia Acquired coagulopathy with supratherapeutic INR Iron deficiency - Hemoglobin dropped to 7.6-MCV was 77-she has been transfused 1 unit of packed red blood cells - Hemoglobin 9.5 followed by 8.6 - INR increased to 3.7 then now is 1.3 - neg mesenteric angiogram on 5/8 and no further bloody stool today- start clears  Active Problems:  SIRS - She remains tachycardic-she was tachypneic but this appears to be improving      Paroxysmal atrial fibrillation (HCC) - in sinus tachycardia on my exam - Warfarin on hold and INR reversed - Toprol -XL 75 mg has been on hold- will order PRN IV Lopressor  for A-fib with uncontrolled HR  HFpEF - Last echo in 2018 showed grade 2 diastolic dysfunction-not on diuretics    DM2 (diabetes mellitus, type 2) (HCC) -Metformin on hold since she had a CTA on 5/8 - she is declining  to take insulin  Hemoglobin A1C    Component Value Date/Time   HGBA1C 6.7 (H) 12/13/2023 1930   Vaginal bleeding Mirena  IUD - The patient actually has 2 IUDs in her uterus  currently - 03/12/2023 hysteroscopy with endometrial sampling revealed friable cervical tissue, dilated cervical os-hysteroscopy had to be abandoned-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   Weight as of this encounter: 192.1 kg.     Code Status: Full Code Total time on patient care: 35 min DVT prophylaxis:  SCDs Start: 12/13/23 2347     Objective:   Vitals:   12/14/23 0200 12/14/23 0300 12/14/23 0400 12/14/23 0700  BP: 111/82   116/61  Pulse: (!) 104 (!) 108  (!) 121  Resp: 17 18  20   Temp:   97.6 F (36.4 C)   TempSrc:   Oral   SpO2: 97% 97%  99%  Weight:      Height:       Filed Weights   12/13/23 1007 12/13/23 2103  Weight: (!) 183.7 kg (!) 192.1 kg   Exam: General exam: Appears comfortable  HEENT: oral mucosa moist Respiratory system: Clear to auscultation.  Cardiovascular system: S1 & S2 heard - RRR, tachycardia Gastrointestinal system: Abdomen soft, non-tender, nondistended. Normal bowel sounds   Extremities: No cyanosis, clubbing or edema Psychiatry:  Mood & affect appropriate.      CBC: Recent Labs  Lab 12/13/23 1018 12/13/23 1930 12/14/23 0045 12/14/23 0308  WBC 8.8 25.6* 25.4* 23.4*  NEUTROABS 6.0  --   --   --  HGB 9.1* 7.6* 9.6* 8.6*  HCT 32.1* 26.4* 31.5* 28.6*  MCV 77.2* 77.0* 77.4* 76.1*  PLT 328 303 302 301   Basic Metabolic Panel: Recent Labs  Lab 12/13/23 1018 12/13/23 1930 12/14/23 0308  NA 138  --  137  K 4.8  --  3.5  CL 105  --  104  CO2 23  --  22  GLUCOSE 121*  --  134*  BUN 33*  --  32*  CREATININE 0.88  --  0.56  CALCIUM 8.4*  --  8.0*  MG  --  1.5* 1.9  PHOS  --  3.2 3.3     Scheduled Meds:  sodium chloride    Intravenous Once   sodium chloride    Intravenous Once   Chlorhexidine  Gluconate Cloth  6  each Topical Daily   guaiFENesin   600 mg Oral BID   insulin  aspart  0-9 Units Subcutaneous Q4H    Imaging and lab data personally reviewed   Author: Trason Shifflet  12/14/2023 7:23 AM  To contact [facility]>   Check the care team in [facility] and look for the attending/consulting [facility] provider listed  Log into [email or web address] and use [place]'s universal password   Go to> "[facility]"  and find provider  If you still have difficulty reaching the provider, please page the [facility] (Director on Call) for the Hospitalists listed on amion

## 2023-12-14 NOTE — Progress Notes (Signed)
 Zoe Cobb 10:38 AM  Subjective: Patient without any complaints and IR note and CTA reviewed and case discussed with the nurse who says since her procedure she has only had tiny bowel movement with only minimal blood and the patient is unable to see her bowels but has not had 1 today  Objective: Vital signs stable afebrile no acute distress abdomen is soft nontender BUN unchanged hemoglobinstable INR 1.3  Assessment: Diverticular bleeding currently stable  Plan: Will allow clear liquids if no signs of bleeding can advance diet tomorrow and you will need to decide blood thinner dose and type going forward and please call us  back if we could be of any further assistance with this hospital stay  [facility]  office [phone] After 5PM or if no answer call [phone]

## 2023-12-14 NOTE — Progress Notes (Signed)
 Patient ID: Zoe Cobb, female   DOB: 09/21/1955, 68 y.o.   MRN: [id number] Patient with history of lower GI hemorrhage, status post visceral angiogram last night with no target identified to embolize.  No further significant bleeding.  Currently stable, afebrile, hemoglobin 8.2 (8.5), puncture site right common femoral artery soft, clean, dry, nontender, no hematoma. Further plans per CCM/[facility]H/GI.

## 2023-12-14 NOTE — Plan of Care (Signed)
  Problem: Fluid Volume: Goal: Ability to maintain a balanced intake and output will improve Outcome: Progressing   Problem: Metabolic: Goal: Ability to maintain appropriate glucose levels will improve Outcome: Progressing   Problem: Nutritional: Goal: Maintenance of adequate nutrition will improve Outcome: Progressing   Problem: Clinical Measurements: Goal: Respiratory complications will improve Outcome: Progressing   Problem: Nutrition: Goal: Adequate nutrition will be maintained Outcome: Progressing   Problem: Pain Managment: Goal: General experience of comfort will improve and/or be controlled Outcome: Progressing

## 2023-12-14 NOTE — TOC Initial Note (Signed)
 Transition of Care [facility]) - Initial/Assessment Note    Patient Details  Name: Zoe Cobb MRN: [id number] Date of Birth: 09-19-1955  Transition of Care [facility]) CM/SW Contact:    Amaryllis Junior, LCSW Phone Number: 12/14/2023, 11:22 AM  Clinical Narrative:                 Pt from home. Pt continues medical workup. Pt currently on 2L O2, will monitor for O2 needs. TOC following for dc needs.     Barriers to Discharge: Continued Medical Work up   Patient Goals and CMS Choice Patient states their goals for this hospitalization and ongoing recovery are:: return home CMS Medicare.gov Compare Post Acute Care list provided to::  (NA) Choice offered to / list presented to : NA [place] ownership interest in [facility].provided to::  (NA)    Expected Discharge Plan and Services In-house Referral: NA     Living arrangements for the past 2 months: Apartment                 DME Arranged: N/A DME Agency: NA       HH Arranged: NA HH Agency: NA        Prior Living Arrangements/Services Living arrangements for the past 2 months: Apartment Lives with:: Self Patient language and need for interpreter reviewed:: Yes Do you feel safe going back to the place where you live?: Yes      Need for Family Participation in Patient Care: Yes (Comment) Care giver support system in place?: Yes (comment)   Criminal Activity/Legal Involvement Pertinent to Current Situation/Hospitalization: No - Comment as needed  Activities of Daily Living      Permission Sought/Granted                  Emotional Assessment Appearance:: Appears stated age Attitude/Demeanor/Rapport: Engaged Affect (typically observed): Accepting Orientation: : Oriented to Self, Oriented to Place, Oriented to  Time, Oriented to Situation Alcohol / Substance Use: Not Applicable Psych Involvement: No (comment)  Admission diagnosis:  Rectal bleeding [K62.5] Lower GI bleed [K92.2] History of GI  diverticular bleed [Z87.19] Anticoagulated on Coumadin  [Z79.01] Patient Active Problem List   Diagnosis Date Noted   Hemorrhagic shock (HCC) 12/13/2023   DM2 (diabetes mellitus, type 2) (HCC) 12/13/2023   Acute blood loss anemia 04/07/2023   Endometrial cancer (HCC) 03/23/2023   Postmenopausal bleeding 03/12/2023   Hypocalcemia 03/01/2023   Diverticulitis of colon with bleeding 11/07/2022   Ambulatory dysfunction 11/07/2022   Cholelithiasis 11/07/2022   Supratherapeutic INR 09/10/2022   Acute GI bleeding 09/09/2022   Vaginal bleeding 11/01/2021   Hyperproteinemia 11/01/2021   Hypoalbuminemia 11/01/2021   Lower GI bleed 08/25/2021   Chronic hepatitis C (HCC) 08/25/2021   Essential hypertension 08/25/2021   Diverticulosis of colon 12/15/2020   Acute blood loss anemia on chronic iron deficiency anemia 12/13/2020   GI bleed 12/12/2020   Anemia    Hematochezia    Lower abdominal pain    Atrial fibrillation (HCC) 10/31/2018   Long term (current) use of anticoagulants 10/05/2018   Peripheral edema    Thrombocytopenia (HCC)    Chronic diastolic (congestive) heart failure (HCC) 09/27/2016   Swelling of left lower extremity 09/27/2016   Paroxysmal atrial fibrillation (HCC) 09/27/2016   AKI (acute kidney injury) (HCC) 09/27/2016   Shortness of breath 08/28/2016   Pressure injury of skin 08/28/2016   Morbid obesity with BMI of 70 and over, adult (HCC) 06/28/2015   PCP:  Thomasene Flemings, NP Pharmacy:  [facility] - [place], [place] - [address] [place] [address] Phone: [phone] Fax: [phone]  [facility] [place] [facility] [address] [place] [address] Phone: [phone] Fax: [phone]     Social Drivers of Health (SDOH) Social History: SDOH Screenings   Food Insecurity: No Food Insecurity (12/14/2023)  Housing: Low Risk  (04/08/2023)  Transportation Needs: No Transportation Needs (04/08/2023)   Utilities: Not At Risk (04/08/2023)  Depression (PHQ2-9): Low Risk  (11/14/2022)  Tobacco Use: Medium Risk (12/13/2023)   SDOH Interventions:     Readmission Risk Interventions    12/14/2023   11:20 AM 04/10/2023    5:55 PM  Readmission Risk Prevention Plan  Transportation Screening Complete Complete  PCP or Specialist Appt within 3-5 Days Complete Complete  HRI or Home Care Consult Complete Complete  Social Work Consult for Recovery Care Planning/Counseling Complete Complete  Palliative Care Screening Not Applicable Not Applicable  Medication Review Ocean[facility]) Complete Complete

## 2023-12-15 DIAGNOSIS — K922 Gastrointestinal hemorrhage, unspecified: Secondary | ICD-10-CM | POA: Diagnosis not present

## 2023-12-15 LAB — CBC
HCT: 26.3 % — ABNORMAL LOW (ref 36.0–46.0)
HCT: 26.3 % — ABNORMAL LOW (ref 36.0–46.0)
Hemoglobin: 7.8 g/dL — ABNORMAL LOW (ref 12.0–15.0)
Hemoglobin: 7.7 g/dL — ABNORMAL LOW (ref 12.0–15.0)
MCH: 23.2 pg — ABNORMAL LOW (ref 26.0–34.0)
MCH: 22.7 pg — ABNORMAL LOW (ref 26.0–34.0)
MCHC: 29.7 g/dL — ABNORMAL LOW (ref 30.0–36.0)
MCHC: 29.3 g/dL — ABNORMAL LOW (ref 30.0–36.0)
MCV: 78.3 fL — ABNORMAL LOW (ref 80.0–100.0)
MCV: 77.6 fL — ABNORMAL LOW (ref 80.0–100.0)
Platelets: 273 10*3/uL (ref 150–400)
Platelets: 284 10*3/uL (ref 150–400)
RBC: 3.36 MIL/uL — ABNORMAL LOW (ref 3.87–5.11)
RBC: 3.39 MIL/uL — ABNORMAL LOW (ref 3.87–5.11)
RDW: 18.5 % — ABNORMAL HIGH (ref 11.5–15.5)
RDW: 18.8 % — ABNORMAL HIGH (ref 11.5–15.5)
WBC: 10 10*3/uL (ref 4.0–10.5)
WBC: 11.6 10*3/uL — ABNORMAL HIGH (ref 4.0–10.5)
nRBC: 0 % (ref 0.0–0.2)
nRBC: 0 % (ref 0.0–0.2)

## 2023-12-15 LAB — GLUCOSE, CAPILLARY
Glucose-Capillary: 132 mg/dL — ABNORMAL HIGH (ref 70–99)
Glucose-Capillary: 136 mg/dL — ABNORMAL HIGH (ref 70–99)
Glucose-Capillary: 138 mg/dL — ABNORMAL HIGH (ref 70–99)
Glucose-Capillary: 138 mg/dL — ABNORMAL HIGH (ref 70–99)
Glucose-Capillary: 144 mg/dL — ABNORMAL HIGH (ref 70–99)
Glucose-Capillary: 208 mg/dL — ABNORMAL HIGH (ref 70–99)

## 2023-12-15 LAB — BASIC METABOLIC PANEL WITH GFR
Anion gap: 9 (ref 5–15)
BUN: 21 mg/dL (ref 8–23)
CO2: 22 mmol/L (ref 22–32)
Calcium: 8.1 mg/dL — ABNORMAL LOW (ref 8.9–10.3)
Chloride: 105 mmol/L (ref 98–111)
Creatinine, Ser: 0.81 mg/dL (ref 0.44–1.00)
GFR, Estimated: >60 mL/min (ref >60)
Glucose, Bld: 130 mg/dL — ABNORMAL HIGH (ref 70–99)
Potassium: 3.6 mmol/L (ref 3.5–5.1)
Sodium: 136 mmol/L (ref 135–145)

## 2023-12-15 LAB — PROTIME-INR
INR: 1.2 (ref 0.8–1.2)
Prothrombin Time: 15.4 s — ABNORMAL HIGH (ref 11.4–15.2)

## 2023-12-15 NOTE — Progress Notes (Signed)
 MEWS Progress Note  Patient Details Name: Zoe Cobb MRN: [id number] DOB: May 22, 1956 Today's Date: 12/15/2023   MEWS Flowsheet Documentation:  Assess: MEWS Score Temp: 98 F (36.7 C) BP: 100/70 MAP (mmHg): 78 Pulse Rate: (!) 107 ECG Heart Rate: (!) 104 Resp: 16 Level of Consciousness: Alert SpO2: 94 % O2 Device: Room Air Patient Activity (if Appropriate): In bed O2 Flow Rate (L/min): 2 L/min Assess: MEWS Score MEWS Temp: 0 MEWS Systolic: 1 MEWS Pulse: 1 MEWS RR: 0 MEWS LOC: 0 MEWS Score: 2 MEWS Score Color: Yellow Assess: SIRS CRITERIA SIRS Temperature : 0 SIRS Respirations : 0 SIRS Pulse: 1 SIRS WBC: 0 SIRS Score Sum : 1 Assess: if the MEWS score is Yellow or Red Were vital signs accurate and taken at a resting state?: Yes Does the patient meet 2 or more of the SIRS criteria?: No MEWS guidelines implemented : Yes, yellow Treat MEWS Interventions: Considered administering scheduled or prn medications/treatments as ordered Take Vital Signs Increase Vital Sign Frequency : Yellow: Q2hr x1, continue Q4hrs until patient remains green for 12hrs Escalate MEWS: Escalate: Yellow: Discuss with charge nurse and consider notifying provider and/or RRT Notify: Charge Nurse/RN Name of Charge Nurse/RN Notified: Kanyon Seibold RN      Alton Bouknight E Micaella Gitto 12/15/2023, 5:41 PM

## 2023-12-15 NOTE — Progress Notes (Signed)
 [facility] Progress Note  Patient: Zoe Cobb     ZOX:[id number]  DOA: 12/13/2023   PCP: Thomasene Flemings, NP       Brief hospital course: 68 year old female who is wheelchair-bound with atrial fibrillation, diverticular bleed, menopausal vaginal bleeding, anemia, diabetes mellitus, hepatitis C, HFpEF obesity presents to the hospital for painless bright red blood per rectum mixed with clots which started on 5/7. Admission time he began 9 in the afternoon In the ED: INR 3.3 secondary to warfarin.  Tachycardic CTA revealed diverticular bleed in the sigmoid colon.  Given FFP, vitamin K, IV pantoprazole , IV fluid boluses. IR consulted and recommended Kcentra .  Blood pressure dropped to 80/42.  Mesenteric angiogram performed but no target identified to embolize.   Subjective:  No further GI bleeding.  Tolerating full liquids.  Sugars were high and she agreed to taking insulin .  Assessment and Plan: Principal Problem: Lower GI bleed -diverticular Acute blood loss anemia Acquired coagulopathy with supratherapeutic INR Iron deficiency - Hemoglobin dropped to 7.6-MCV was 77-she has been transfused 1 unit of packed red blood cells - Hemoglobin 9.5 followed by 8.6 - INR increased to 3.7 then now is 1.2 - neg mesenteric angiogram on 5/8 and no further bloody stool today--advance to soft food today and follow overnight -I plan to hold off on Coumadin  when she is discharged-she will need to follow-up with cardiology  Active Problems:  SIRS - She remains tachycardic-she was tachypneic but this appears to be improving      Paroxysmal atrial fibrillation (HCC) - in sinus tachycardia on my exam-heart rate in the low 100s today - Warfarin on hold and INR reversed - Toprol -XL 75 mg has been on hold-continue PRN IV Lopressor  for A-fib with uncontrolled HR - When she is tolerating solids without bleeding, we can switch back to long-acting  HFpEF - Last echo in 2018 showed grade 2  diastolic dysfunction-not on diuretics    DM2 (diabetes mellitus, type 2) (HCC) -Metformin on hold since she had a CTA on 5/8 - she is declining  to take insulin  Hemoglobin A1C    Component Value Date/Time   HGBA1C 6.7 (H) 12/13/2023 1930   Vaginal bleeding Mirena  IUD - The patient actually has 2 IUDs in her uterus currently - 03/12/2023 hysteroscopy with endometrial sampling revealed friable cervical tissue, dilated cervical os-hysteroscopy had to be abandoned-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   Weight as of this encounter: 192.1 kg.     Code Status: Full Code Total time on patient care: 35 min DVT prophylaxis:  SCDs Start: 12/13/23 2347     Objective:   Vitals:   12/15/23 1000 12/15/23 1100 12/15/23 1200 12/15/23 1241  BP: 102/65 106/71 118/73 105/69  Pulse: (!) 107 (!) 114 (!) 103 (!) 104  Resp: 17 19 17 18   Temp:    97.6 F (36.4 C)  TempSrc:    Oral  SpO2: 97% 96% 98% 96%  Weight:      Height:       Filed Weights   12/13/23 1007 12/13/23 2103  Weight: (!) 183.7 kg (!) 192.1 kg   Exam: General exam: Appears comfortable  HEENT: oral mucosa moist Respiratory system: Clear to auscultation.  Cardiovascular system: S1 & S2 heard - RRR, tachycardia Gastrointestinal system: Abdomen soft, non-tender, nondistended.  Normal bowel sounds   Extremities: No cyanosis, clubbing or edema Psychiatry:  Mood & affect appropriate.      CBC: Recent Labs  Lab 12/13/23 1018 12/13/23 1930 12/14/23 0308 12/14/23 0828 12/14/23 1316 12/14/23 1901 12/15/23 0530  WBC 8.8   < > 23.4* 17.9* 15.8* 13.1* 10.0  NEUTROABS 6.0  --   --   --   --   --   --   HGB 9.1*   < > 8.6* 8.5* 8.2* 7.7* 7.8*  HCT 32.1*   < > 28.6* 28.1* 27.1* 26.5* 26.3*  MCV 77.2*   < >  76.1* 76.6* 76.3* 79.1* 78.3*  PLT 328   < > 301 299 293 258 273   < > = values in this interval not displayed.   Basic Metabolic Panel: Recent Labs  Lab 12/13/23 1018 12/13/23 1930 12/14/23 0308 12/15/23 0530  NA 138  --  137 136  K 4.8  --  3.5 3.6  CL 105  --  104 105  CO2 23  --  22 22  GLUCOSE 121*  --  134* 130*  BUN 33*  --  32* 21  CREATININE 0.88  --  0.56 0.81  CALCIUM 8.4*  --  8.0* 8.1*  MG  --  1.5* 1.9  --   PHOS  --  3.2 3.3  --      Scheduled Meds:  sodium chloride    Intravenous Once   sodium chloride    Intravenous Once   Chlorhexidine  Gluconate Cloth  6 each Topical Daily   feeding supplement  1 Container Oral TID BM   guaiFENesin   600 mg Oral BID   insulin  aspart  0-9 Units Subcutaneous Q4H    Imaging and lab data personally reviewed   Author: Briseyda Fehr  12/15/2023 4:24 PM  To contact [facility]>   Check the care team in [facility] and look for the attending/consulting TR[facility] provider listed  Log into [email or web address] and use [place]'s universal password   Go to> "[facility]"  and find provider  If you still have difficulty reaching the provider, please page the [facility] (Director on Call) for the Hospitalists listed on amion

## 2023-12-15 NOTE — Plan of Care (Signed)

## 2023-12-16 DIAGNOSIS — K922 Gastrointestinal hemorrhage, unspecified: Secondary | ICD-10-CM | POA: Diagnosis not present

## 2023-12-16 LAB — CBC
HCT: 26.2 % — ABNORMAL LOW (ref 36.0–46.0)
Hemoglobin: 7.9 g/dL — ABNORMAL LOW (ref 12.0–15.0)
MCH: 23.3 pg — ABNORMAL LOW (ref 26.0–34.0)
MCHC: 30.2 g/dL (ref 30.0–36.0)
MCV: 77.3 fL — ABNORMAL LOW (ref 80.0–100.0)
Platelets: 303 10*3/uL (ref 150–400)
RBC: 3.39 MIL/uL — ABNORMAL LOW (ref 3.87–5.11)
RDW: 18.8 % — ABNORMAL HIGH (ref 11.5–15.5)
WBC: 9.5 10*3/uL (ref 4.0–10.5)
nRBC: 0 % (ref 0.0–0.2)

## 2023-12-16 LAB — PROTIME-INR
INR: 1.2 (ref 0.8–1.2)
Prothrombin Time: 15.3 s — ABNORMAL HIGH (ref 11.4–15.2)

## 2023-12-16 LAB — GLUCOSE, CAPILLARY
Glucose-Capillary: 124 mg/dL — ABNORMAL HIGH (ref 70–99)
Glucose-Capillary: 126 mg/dL — ABNORMAL HIGH (ref 70–99)
Glucose-Capillary: 153 mg/dL — ABNORMAL HIGH (ref 70–99)
Glucose-Capillary: 171 mg/dL — ABNORMAL HIGH (ref 70–99)

## 2023-12-16 NOTE — Progress Notes (Signed)
 CBC and PT/INR have not been completed this morning despite there being a standing order for them. Stools have not been documented although the patient states she is having bowel movements.

## 2023-12-16 NOTE — Progress Notes (Signed)
 Zoe Cobb to be D/C'd Home per MD order.  Discussed with the patient and all questions fully answered.  VSS, Skin clean, dry and intact without evidence of skin break down, no evidence of skin tears noted. IV catheters discontinued intact. Site without signs and symptoms of complications. Dressing and pressure applied.  An After Visit Summary was printed and given to the patient.   D/c education completed with patient/family including follow up instructions, medication list, d/c activities limitations if indicated, with other d/c instructions as indicated by MD - patient able to verbalize understanding, all questions fully answered.   Patient instructed to return to ED, call 911, or call MD for any changes in condition.   Patient escorted via stretcher, and D/C home via non emergency ambulance.  Zoe Cobb L Fredna Stricker 12/16/2023 5:00 PM

## 2023-12-16 NOTE — TOC Progression Note (Signed)
 Transition of Care [facility]) - Progression Note    Patient Details  Name: Zoe Cobb MRN: [id number] Date of Birth: 29-Aug-1955  Transition of Care [facility]) CM/SW Contact  Katrine Parody, LCSW Phone Number: 12/16/2023, 1:25 PM  Clinical Narrative:     Pt clear for DC. [place] from RN sharing that pt will need PTAR transport. CSW visited pt at bedside, verified address on file, pt verbal, alert states someone is at home and aware that she is returning. CSW agreed to arrange ambulance transport.  Med necessity, completed, added to PTAR list.  No further TOC needs.    Barriers to Discharge: No Barriers Identified  Expected Discharge Plan and Services In-house Referral: NA     Living arrangements for the past 2 months: Apartment Expected Discharge Date: 12/16/23               DME Arranged: N/A DME Agency: NA       HH Arranged: NA HH Agency: NA         Social Determinants of Health (SDOH) Interventions SDOH Screenings   Food Insecurity: No Food Insecurity (12/14/2023)  Housing: Low Risk  (12/14/2023)  Transportation Needs: No Transportation Needs (12/14/2023)  Utilities: Not At Risk (12/14/2023)  Depression (PHQ2-9): Low Risk  (11/14/2022)  Social Connections: Socially Integrated (12/14/2023)  Tobacco Use: Medium Risk (12/13/2023)    Readmission Risk Interventions    12/14/2023   11:20 AM 04/10/2023    5:55 PM  Readmission Risk Prevention Plan  Transportation Screening Complete Complete  PCP or Specialist Appt within 3-5 Days Complete Complete  HRI or Home Care Consult Complete Complete  Social Work Consult for Recovery Care Planning/Counseling Complete Complete  Palliative Care Screening Not Applicable Not Applicable  Medication Review Oceanographer) Complete Complete

## 2023-12-16 NOTE — Discharge Summary (Signed)
 Physician Discharge Summary  Zoe Cobb JYN:[id number] DOB: [birth year] DOA: 12/13/2023  PCP: Zoe Flemings, NP  Admit date: 12/13/2023 Discharge date: 12/16/2023 Discharging to: SNF Recommendations for Outpatient Follow-up:  Warfarin on hold due to 2nd episode of life threatening bleeding Antihypertensive on hold due to hypotension- please check BP and HR daily to determine if/when to resume  Consults:  IR     Discharge Diagnoses:   Principal Problem:   Lower GI bleed Active Problems:   Supratherapeutic INR   Acute blood loss anemia on chronic iron deficiency anemia   Morbid obesity with BMI of 70 and over, adult (HCC)   Paroxysmal atrial fibrillation (HCC)   Essential hypertension   Hemorrhagic shock (HCC)   DM2 (diabetes mellitus, type 2) [facility])     Hospital Course:  68 year old female who resides at a SNF, is wheelchair-bound with atrial fibrillation, diverticular bleed, menopausal vaginal bleeding, anemia, diabetes mellitus, hepatitis C, HFpEF obesity presents to the hospital for painless bright red blood per rectum mixed with clots which started on 5/7.  In the ED: INR 3.3 secondary to warfarin.  Tachycardic, RR as high as 30   CTA revealed diverticular bleed in the sigmoid colon.   Given FFP, vitamin K, IV pantoprazole , IV fluid boluses. IR consulted and recommended Kcentra .  Blood pressure dropped to 80/42.  Mesenteric angiogram performed but no target identified to embolize.  Principal Problem: Lower GI bleed-diverticular with hemorrhagic shock Acute blood loss anemia Acquired coagulopathy with supratherapeutic INR Iron deficiency - Hemoglobin dropped to 7.6-MCV was 77-she has been transfused 1 unit of packed red blood cells - Hemoglobin 9.5 followed by 8.6> 7.9 today - INR increased to 3.7 then has been 1.2 since 5/10 - neg mesenteric angiogram on 5/8 and no further bloody stool today--advance to soft food on 5/10 and is tolerating this well w/o bloody stools -  Will need to hold off on Warfarin due to 2nd episode of severe GI bleed   Active Problems:   SIRS - improved      Paroxysmal atrial fibrillation (HCC) - HR has improved to 86-102 today - Warfarin on hold and INR reversed - Toprol -XL 75 mg has been on hold due to hypotension - can resume Toprol  at facility based upon BP and HR    HFpEF - Last echo in 2018 showed grade 2 diastolic dysfunction-not on diuretics     DM2 (diabetes mellitus, type 2) (HCC) -Metformin on hold since she had a CTA on 5/8 - she is declining  to take insulin  Hemoglobin A1C Labs (Brief)          Component Value Date/Time    HGBA1C 6.7 (H) 12/13/2023 1930      Vaginal bleeding Mirena  IUD - The patient actually has 2 IUDs in her uterus currently - 03/12/2023 hysteroscopy with endometrial sampling revealed friable cervical tissue, dilated cervical os-hysteroscopy had to be abandoned-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   Weight as of this encounter: 192.1 kg.            Discharge Instructions  Discharge Instructions     Diet - low sodium heart healthy   Complete by: As directed    Diet Carb Modified   Complete by: As directed    Increase activity slowly   Complete by: As  directed    No wound care   Complete by: As directed       Allergies as of 12/16/2023       Reactions   Penicillins Swelling        Medication List     STOP taking these medications    cefdinir  300 MG capsule Commonly known as: OMNICEF    isosorbide mononitrate 60 MG 24 hr tablet Commonly known as: IMDUR   metoprolol  succinate 50 MG 24 hr tablet Commonly known as: TOPROL -XL   warfarin 2.5 MG tablet Commonly known as: COUMADIN        TAKE these medications    acetaminophen  500 MG  tablet Commonly known as: TYLENOL  Take 500 mg by mouth every 6 (six) hours as needed.   metFORMIN 1000 MG tablet Commonly known as: GLUCOPHAGE Take 1,000 mg by mouth 2 (two) times daily.   methocarbamol 500 MG tablet Commonly known as: ROBAXIN Take 500 mg by mouth 2 (two) times daily as needed for muscle spasms.   Mirena  (52 MG) 20 MCG/DAY Iud Generic drug: levonorgestrel  Intrauterine device to be placed in office.   vitamin B-12 500 MCG tablet Commonly known as: CYANOCOBALAMIN  Take 500 mcg by mouth daily.            The results of significant diagnostics from this hospitalization (including imaging, microbiology, ancillary and laboratory) are listed below for reference.    IR Radiologist Eval & Mgmt Result Date: 12/14/2023 EXAM: NEW PATIENT OFFICE VISIT CHIEF COMPLAINT: Electronic medical record HISTORY OF PRESENT ILLNESS: Electronic medical record REVIEW OF SYSTEMS: Electronic medical record PHYSICAL EXAMINATION: Electronic medical record ASSESSMENT AND PLAN: Electronic medical record Electronically Signed   By: Myrlene Asper D.O.   On: 12/14/2023 16:01   IR US  Guide Vasc Access Right Result Date: 12/14/2023 INDICATION: 68 year old female with lower GI bleeding, positive CT and hemodynamically unstable presents for angiogram and possible treatment EXAM: IR EMBO ART VEN HEMORR LYMPH EXTRAV INC GUIDE ROADMAPPING; ADDITIONAL ARTERIOGRAPHY; SELECTIVE VISCERAL ARTERIOGRAPHY; IR ULTRASOUND GUIDANCE VASC ACCESS RIGHT MEDICATIONS: 100 mcg intra arterial nitroglycerin  ANESTHESIA/SEDATION: Moderate (conscious) sedation was not employed during this procedure. A total of Versed  0 mg and Fentanyl  100 mcg was administered intravenously. Moderate Sedation Time: 0 minutes. The patient's level of consciousness and vital signs were monitored continuously by radiology nursing throughout the procedure under my direct supervision. CONTRAST:  50mL OMNIPAQUE  IOHEXOL  300 MG/ML SOLN, 50mL OMNIPAQUE  IOHEXOL   300 MG/ML SOLN FLUOROSCOPY: Radiation Exposure Index (as provided by the fluoroscopic device): 3,970 mGy Kerma COMPLICATIONS: None PROCEDURE: Informed consent was obtained from the patient following explanation of the procedure, risks, benefits and alternatives. The patient understands, agrees and consents for the procedure. All questions were addressed. A time out was performed prior to the initiation of the procedure. Maximal barrier sterile technique utilized including caps, mask, sterile gowns, sterile gloves, large sterile drape, hand hygiene, and Betadine prep. Ultrasound survey of the right inguinal region was performed with images stored and sent to PACs, confirming patency of the vessel. A micropuncture needle was used access the right common femoral artery under ultrasound. With excellent arterial blood flow returned, and an .018 micro wire was passed through the needle, observed enter the abdominal aorta under fluoroscopy. The needle was removed, and a micropuncture sheath was placed over the wire. The inner dilator and wire were removed, and an 035 Bentson wire was advanced under fluoroscopy into the abdominal aorta. The sheath was removed and a standard 5 [place] vascular sheath was placed. The dilator  was removed and the sheath was flushed. A Mickelson catheter was passed on the Bentson wire into the aorta, with the recur for shaped within the thoracic aorta. Catheter was then withdrawn into the abdominal aorta and used to select the inferior mesenteric artery origin. Angiogram was performed. A penumbra lantern catheter was advanced through the [facility] catheter using 14 fathom wire, to select the first sigmoid artery branch. Angiogram was performed. Catheter was withdrawn into the IMA, and then wire was used to select the the superior rectal/hemorrhoidal artery. Angiogram was performed.  Multiple obliquities obtained. Catheter was then withdrawn into the IMA, with the wire used to select a separate  sigmoid artery branch, that supplied a marginal artery of the redundant sigmoid. Angiogram was performed. Super selective catheter position was then performed into the Vasa recta of the sigmoid artery branch. Angiogram was performed. Catheter was repositioned into the redundant loop of the sigmoid artery system. Angiogram was performed. After review of CT imaging in the IR suite, the catheter was again position into the sigmoid arteries of interest. Once we confirmed location, 100 mcg of nitroglycerin  was infused intra arterial as a provocative maneuver in the area of interest. Repeat angiogram was performed after intra arterial nitroglycerin . All images were reviewed and we elected to terminate. Catheters and wires removed. A Celt was deployed for hemostasis. Patient was hemodynamically stable throughout the procedure. Tolerated well with no blood loss. FINDINGS: Ultrasound demonstrates patent right common femoral artery without significant atherosclerotic changes. Inferior mesenteric artery angiogram demonstrates no significant atherosclerotic changes with the IMA supplying a mesenteric artery, superior rectal arteries, sigmoid arteries. No extravasation, puddling, blush identified on the initial angiogram. Sigmoid artery angiogram in the redundant loops of sigmoid demonstrates no puddling of contrast, staining, pseudoaneurysm, blush. No arteriovenous shunting. Superior rectal angiogram demonstrates no extravasation, puddling, blush, AV shunting. Retained stool in the rectum. The second sigmoid artery angiogram demonstrates no extravasation, puddling, blush, AV shunting. Super selective catheter placement into the Vasa recta arteries of the sigmoid colon demonstrates no extravasation, puddling, blush, AV shunting. Ultimately no site of extravasation was identified, and this includes after provocative maneuver with nitroglycerin . No embolization was performed. IMPRESSION: Status post ultrasound guided access right  common femoral artery for mesenteric angiogram of the IMA distribution including super selective catheter position into multiple arteries of interest, provocative maneuvers, and ultimately negative angiogram. Celt for hemostasis Signed, Marciano Settles. Rexine Cater, RPVI [facility] Radiology Electronically Signed   By: Myrlene Asper D.O.   On: 12/14/2023 08:41   IR Angiogram Visceral Selective Result Date: 12/14/2023 INDICATION: 68 year old female with lower GI bleeding, positive CT and hemodynamically unstable presents for angiogram and possible treatment EXAM: IR EMBO ART VEN HEMORR LYMPH EXTRAV INC GUIDE ROADMAPPING; ADDITIONAL ARTERIOGRAPHY; SELECTIVE VISCERAL ARTERIOGRAPHY; IR ULTRASOUND GUIDANCE VASC ACCESS RIGHT MEDICATIONS: 100 mcg intra arterial nitroglycerin  ANESTHESIA/SEDATION: Moderate (conscious) sedation was not employed during this procedure. A total of Versed  0 mg and Fentanyl  100 mcg was administered intravenously. Moderate Sedation Time: 0 minutes. The patient's level of consciousness and vital signs were monitored continuously by radiology nursing throughout the procedure under my direct supervision. CONTRAST:  50mL OMNIPAQUE  IOHEXOL  300 MG/ML SOLN, 50mL OMNIPAQUE  IOHEXOL  300 MG/ML SOLN FLUOROSCOPY: Radiation Exposure Index (as provided by the fluoroscopic device): 3,970 mGy Kerma COMPLICATIONS: None PROCEDURE: Informed consent was obtained from the patient following explanation of the procedure, risks, benefits and alternatives. The patient understands, agrees and consents for the procedure. All questions were addressed. A  time out was performed prior to the initiation of the procedure. Maximal barrier sterile technique utilized including caps, mask, sterile gowns, sterile gloves, large sterile drape, hand hygiene, and Betadine prep. Ultrasound survey of the right inguinal region was performed with images stored and sent to PACs, confirming patency  of the vessel. A micropuncture needle was used access the right common femoral artery under ultrasound. With excellent arterial blood flow returned, and an .018 micro wire was passed through the needle, observed enter the abdominal aorta under fluoroscopy. The needle was removed, and a micropuncture sheath was placed over the wire. The inner dilator and wire were removed, and an 035 Bentson wire was advanced under fluoroscopy into the abdominal aorta. The sheath was removed and a standard 5 [place] vascular sheath was placed. The dilator was removed and the sheath was flushed. A Mickelson catheter was passed on the Bentson wire into the aorta, with the recur for shaped within the thoracic aorta. Catheter was then withdrawn into the abdominal aorta and used to select the inferior mesenteric artery origin. Angiogram was performed. A penumbra lantern catheter was advanced through the [facility] catheter using 14 fathom wire, to select the first sigmoid artery branch. Angiogram was performed. Catheter was withdrawn into the IMA, and then wire was used to select the the superior rectal/hemorrhoidal artery. Angiogram was performed.  Multiple obliquities obtained. Catheter was then withdrawn into the IMA, with the wire used to select a separate sigmoid artery branch, that supplied a marginal artery of the redundant sigmoid. Angiogram was performed. Super selective catheter position was then performed into the Vasa recta of the sigmoid artery branch. Angiogram was performed. Catheter was repositioned into the redundant loop of the sigmoid artery system. Angiogram was performed. After review of CT imaging in the IR suite, the catheter was again position into the sigmoid arteries of interest. Once we confirmed location, 100 mcg of nitroglycerin  was infused intra arterial as a provocative maneuver in the area of interest. Repeat angiogram was performed after intra arterial nitroglycerin . All images were reviewed and we elected  to terminate. Catheters and wires removed. A Celt was deployed for hemostasis. Patient was hemodynamically stable throughout the procedure. Tolerated well with no blood loss. FINDINGS: Ultrasound demonstrates patent right common femoral artery without significant atherosclerotic changes. Inferior mesenteric artery angiogram demonstrates no significant atherosclerotic changes with the IMA supplying a mesenteric artery, superior rectal arteries, sigmoid arteries. No extravasation, puddling, blush identified on the initial angiogram. Sigmoid artery angiogram in the redundant loops of sigmoid demonstrates no puddling of contrast, staining, pseudoaneurysm, blush. No arteriovenous shunting. Superior rectal angiogram demonstrates no extravasation, puddling, blush, AV shunting. Retained stool in the rectum. The second sigmoid artery angiogram demonstrates no extravasation, puddling, blush, AV shunting. Super selective catheter placement into the Vasa recta arteries of the sigmoid colon demonstrates no extravasation, puddling, blush, AV shunting. Ultimately no site of extravasation was identified, and this includes after provocative maneuver with nitroglycerin . No embolization was performed. IMPRESSION: Status post ultrasound guided access right common femoral artery for mesenteric angiogram of the IMA distribution including super selective catheter position into multiple arteries of interest, provocative maneuvers, and ultimately negative angiogram. Celt for hemostasis Signed, Marciano Settles. Rexine Cater, RPVI [facility] Radiology Electronically Signed   By: Myrlene Asper D.O.   On: 12/14/2023 08:41   IR Angiogram Selective Each Additional Vessel Result Date: 12/14/2023 INDICATION: 68 year old female with lower GI bleeding, positive CT and hemodynamically unstable presents for angiogram and possible  treatment EXAM: IR EMBO ART VEN HEMORR LYMPH EXTRAV INC GUIDE ROADMAPPING;  ADDITIONAL ARTERIOGRAPHY; SELECTIVE VISCERAL ARTERIOGRAPHY; IR ULTRASOUND GUIDANCE VASC ACCESS RIGHT MEDICATIONS: 100 mcg intra arterial nitroglycerin  ANESTHESIA/SEDATION: Moderate (conscious) sedation was not employed during this procedure. A total of Versed  0 mg and Fentanyl  100 mcg was administered intravenously. Moderate Sedation Time: 0 minutes. The patient's level of consciousness and vital signs were monitored continuously by radiology nursing throughout the procedure under my direct supervision. CONTRAST:  50mL OMNIPAQUE  IOHEXOL  300 MG/ML SOLN, 50mL OMNIPAQUE  IOHEXOL  300 MG/ML SOLN FLUOROSCOPY: Radiation Exposure Index (as provided by the fluoroscopic device): 3,970 mGy Kerma COMPLICATIONS: None PROCEDURE: Informed consent was obtained from the patient following explanation of the procedure, risks, benefits and alternatives. The patient understands, agrees and consents for the procedure. All questions were addressed. A time out was performed prior to the initiation of the procedure. Maximal barrier sterile technique utilized including caps, mask, sterile gowns, sterile gloves, large sterile drape, hand hygiene, and Betadine prep. Ultrasound survey of the right inguinal region was performed with images stored and sent to PACs, confirming patency of the vessel. A micropuncture needle was used access the right common femoral artery under ultrasound. With excellent arterial blood flow returned, and an .018 micro wire was passed through the needle, observed enter the abdominal aorta under fluoroscopy. The needle was removed, and a micropuncture sheath was placed over the wire. The inner dilator and wire were removed, and an 035 Bentson wire was advanced under fluoroscopy into the abdominal aorta. The sheath was removed and a standard 5 [place] vascular sheath was placed. The dilator was removed and the sheath was flushed. A Mickelson catheter was passed on the Bentson wire into the aorta, with the recur for shaped  within the thoracic aorta. Catheter was then withdrawn into the abdominal aorta and used to select the inferior mesenteric artery origin. Angiogram was performed. A penumbra lantern catheter was advanced through the [facility] catheter using 14 fathom wire, to select the first sigmoid artery branch. Angiogram was performed. Catheter was withdrawn into the IMA, and then wire was used to select the the superior rectal/hemorrhoidal artery. Angiogram was performed.  Multiple obliquities obtained. Catheter was then withdrawn into the IMA, with the wire used to select a separate sigmoid artery branch, that supplied a marginal artery of the redundant sigmoid. Angiogram was performed. Super selective catheter position was then performed into the Vasa recta of the sigmoid artery branch. Angiogram was performed. Catheter was repositioned into the redundant loop of the sigmoid artery system. Angiogram was performed. After review of CT imaging in the IR suite, the catheter was again position into the sigmoid arteries of interest. Once we confirmed location, 100 mcg of nitroglycerin  was infused intra arterial as a provocative maneuver in the area of interest. Repeat angiogram was performed after intra arterial nitroglycerin . All images were reviewed and we elected to terminate. Catheters and wires removed. A Celt was deployed for hemostasis. Patient was hemodynamically stable throughout the procedure. Tolerated well with no blood loss. FINDINGS: Ultrasound demonstrates patent right common femoral artery without significant atherosclerotic changes. Inferior mesenteric artery angiogram demonstrates no significant atherosclerotic changes with the IMA supplying a mesenteric artery, superior rectal arteries, sigmoid arteries. No extravasation, puddling, blush identified on the initial angiogram. Sigmoid artery angiogram in the redundant loops of sigmoid demonstrates no puddling of contrast, staining, pseudoaneurysm, blush. No  arteriovenous shunting. Superior rectal angiogram demonstrates no extravasation, puddling, blush, AV shunting. Retained stool in the rectum. The second sigmoid artery  angiogram demonstrates no extravasation, puddling, blush, AV shunting. Super selective catheter placement into the Vasa recta arteries of the sigmoid colon demonstrates no extravasation, puddling, blush, AV shunting. Ultimately no site of extravasation was identified, and this includes after provocative maneuver with nitroglycerin . No embolization was performed. IMPRESSION: Status post ultrasound guided access right common femoral artery for mesenteric angiogram of the IMA distribution including super selective catheter position into multiple arteries of interest, provocative maneuvers, and ultimately negative angiogram. Celt for hemostasis Signed, Marciano Settles. Rexine Cater, RPVI [facility] Radiology Electronically Signed   By: Myrlene Asper D.O.   On: 12/14/2023 08:41   IR Angiogram Selective Each Additional Vessel Result Date: 12/14/2023 INDICATION: 68 year old female with lower GI bleeding, positive CT and hemodynamically unstable presents for angiogram and possible treatment EXAM: IR EMBO ART VEN HEMORR LYMPH EXTRAV INC GUIDE ROADMAPPING; ADDITIONAL ARTERIOGRAPHY; SELECTIVE VISCERAL ARTERIOGRAPHY; IR ULTRASOUND GUIDANCE VASC ACCESS RIGHT MEDICATIONS: 100 mcg intra arterial nitroglycerin  ANESTHESIA/SEDATION: Moderate (conscious) sedation was not employed during this procedure. A total of Versed  0 mg and Fentanyl  100 mcg was administered intravenously. Moderate Sedation Time: 0 minutes. The patient's level of consciousness and vital signs were monitored continuously by radiology nursing throughout the procedure under my direct supervision. CONTRAST:  50mL OMNIPAQUE  IOHEXOL  300 MG/ML SOLN, 50mL OMNIPAQUE  IOHEXOL  300 MG/ML SOLN FLUOROSCOPY: Radiation Exposure Index (as provided by the fluoroscopic  device): 3,970 mGy Kerma COMPLICATIONS: None PROCEDURE: Informed consent was obtained from the patient following explanation of the procedure, risks, benefits and alternatives. The patient understands, agrees and consents for the procedure. All questions were addressed. A time out was performed prior to the initiation of the procedure. Maximal barrier sterile technique utilized including caps, mask, sterile gowns, sterile gloves, large sterile drape, hand hygiene, and Betadine prep. Ultrasound survey of the right inguinal region was performed with images stored and sent to PACs, confirming patency of the vessel. A micropuncture needle was used access the right common femoral artery under ultrasound. With excellent arterial blood flow returned, and an .018 micro wire was passed through the needle, observed enter the abdominal aorta under fluoroscopy. The needle was removed, and a micropuncture sheath was placed over the wire. The inner dilator and wire were removed, and an 035 Bentson wire was advanced under fluoroscopy into the abdominal aorta. The sheath was removed and a standard 5 [place] vascular sheath was placed. The dilator was removed and the sheath was flushed. A Mickelson catheter was passed on the Bentson wire into the aorta, with the recur for shaped within the thoracic aorta. Catheter was then withdrawn into the abdominal aorta and used to select the inferior mesenteric artery origin. Angiogram was performed. A penumbra lantern catheter was advanced through the [facility] catheter using 14 fathom wire, to select the first sigmoid artery branch. Angiogram was performed. Catheter was withdrawn into the IMA, and then wire was used to select the the superior rectal/hemorrhoidal artery. Angiogram was performed.  Multiple obliquities obtained. Catheter was then withdrawn into the IMA, with the wire used to select a separate sigmoid artery branch, that supplied a marginal artery of the redundant sigmoid.  Angiogram was performed. Super selective catheter position was then performed into the Vasa recta of the sigmoid artery branch. Angiogram was performed. Catheter was repositioned into the redundant loop of the sigmoid artery system. Angiogram was performed. After review of CT imaging in the IR suite, the catheter was again position into the sigmoid arteries of interest. Once we confirmed location,  100 mcg of nitroglycerin  was infused intra arterial as a provocative maneuver in the area of interest. Repeat angiogram was performed after intra arterial nitroglycerin . All images were reviewed and we elected to terminate. Catheters and wires removed. A Celt was deployed for hemostasis. Patient was hemodynamically stable throughout the procedure. Tolerated well with no blood loss. FINDINGS: Ultrasound demonstrates patent right common femoral artery without significant atherosclerotic changes. Inferior mesenteric artery angiogram demonstrates no significant atherosclerotic changes with the IMA supplying a mesenteric artery, superior rectal arteries, sigmoid arteries. No extravasation, puddling, blush identified on the initial angiogram. Sigmoid artery angiogram in the redundant loops of sigmoid demonstrates no puddling of contrast, staining, pseudoaneurysm, blush. No arteriovenous shunting. Superior rectal angiogram demonstrates no extravasation, puddling, blush, AV shunting. Retained stool in the rectum. The second sigmoid artery angiogram demonstrates no extravasation, puddling, blush, AV shunting. Super selective catheter placement into the Vasa recta arteries of the sigmoid colon demonstrates no extravasation, puddling, blush, AV shunting. Ultimately no site of extravasation was identified, and this includes after provocative maneuver with nitroglycerin . No embolization was performed. IMPRESSION: Status post ultrasound guided access right common femoral artery for mesenteric angiogram of the IMA distribution including  super selective catheter position into multiple arteries of interest, provocative maneuvers, and ultimately negative angiogram. Celt for hemostasis Signed, Marciano Settles. Rexine Cater, RPVI [facility] Radiology Electronically Signed   By: Myrlene Asper D.O.   On: 12/14/2023 08:41   IR Angiogram Selective Each Additional Vessel Result Date: 12/14/2023 INDICATION: 68 year old female with lower GI bleeding, positive CT and hemodynamically unstable presents for angiogram and possible treatment EXAM: IR EMBO ART VEN HEMORR LYMPH EXTRAV INC GUIDE ROADMAPPING; ADDITIONAL ARTERIOGRAPHY; SELECTIVE VISCERAL ARTERIOGRAPHY; IR ULTRASOUND GUIDANCE VASC ACCESS RIGHT MEDICATIONS: 100 mcg intra arterial nitroglycerin  ANESTHESIA/SEDATION: Moderate (conscious) sedation was not employed during this procedure. A total of Versed  0 mg and Fentanyl  100 mcg was administered intravenously. Moderate Sedation Time: 0 minutes. The patient's level of consciousness and vital signs were monitored continuously by radiology nursing throughout the procedure under my direct supervision. CONTRAST:  50mL OMNIPAQUE  IOHEXOL  300 MG/ML SOLN, 50mL OMNIPAQUE  IOHEXOL  300 MG/ML SOLN FLUOROSCOPY: Radiation Exposure Index (as provided by the fluoroscopic device): 3,970 mGy Kerma COMPLICATIONS: None PROCEDURE: Informed consent was obtained from the patient following explanation of the procedure, risks, benefits and alternatives. The patient understands, agrees and consents for the procedure. All questions were addressed. A time out was performed prior to the initiation of the procedure. Maximal barrier sterile technique utilized including caps, mask, sterile gowns, sterile gloves, large sterile drape, hand hygiene, and Betadine prep. Ultrasound survey of the right inguinal region was performed with images stored and sent to PACs, confirming patency of the vessel. A micropuncture needle was used access the right  common femoral artery under ultrasound. With excellent arterial blood flow returned, and an .018 micro wire was passed through the needle, observed enter the abdominal aorta under fluoroscopy. The needle was removed, and a micropuncture sheath was placed over the wire. The inner dilator and wire were removed, and an 035 Bentson wire was advanced under fluoroscopy into the abdominal aorta. The sheath was removed and a standard 5 [place] vascular sheath was placed. The dilator was removed and the sheath was flushed. A Mickelson catheter was passed on the Bentson wire into the aorta, with the recur for shaped within the thoracic aorta. Catheter was then withdrawn into the abdominal aorta and used to select the inferior mesenteric artery origin. Angiogram was  performed. A penumbra lantern catheter was advanced through the [facility] catheter using 14 fathom wire, to select the first sigmoid artery branch. Angiogram was performed. Catheter was withdrawn into the IMA, and then wire was used to select the the superior rectal/hemorrhoidal artery. Angiogram was performed.  Multiple obliquities obtained. Catheter was then withdrawn into the IMA, with the wire used to select a separate sigmoid artery branch, that supplied a marginal artery of the redundant sigmoid. Angiogram was performed. Super selective catheter position was then performed into the Vasa recta of the sigmoid artery branch. Angiogram was performed. Catheter was repositioned into the redundant loop of the sigmoid artery system. Angiogram was performed. After review of CT imaging in the IR suite, the catheter was again position into the sigmoid arteries of interest. Once we confirmed location, 100 mcg of nitroglycerin  was infused intra arterial as a provocative maneuver in the area of interest. Repeat angiogram was performed after intra arterial nitroglycerin . All images were reviewed and we elected to terminate. Catheters and wires removed. A Celt was deployed  for hemostasis. Patient was hemodynamically stable throughout the procedure. Tolerated well with no blood loss. FINDINGS: Ultrasound demonstrates patent right common femoral artery without significant atherosclerotic changes. Inferior mesenteric artery angiogram demonstrates no significant atherosclerotic changes with the IMA supplying a mesenteric artery, superior rectal arteries, sigmoid arteries. No extravasation, puddling, blush identified on the initial angiogram. Sigmoid artery angiogram in the redundant loops of sigmoid demonstrates no puddling of contrast, staining, pseudoaneurysm, blush. No arteriovenous shunting. Superior rectal angiogram demonstrates no extravasation, puddling, blush, AV shunting. Retained stool in the rectum. The second sigmoid artery angiogram demonstrates no extravasation, puddling, blush, AV shunting. Super selective catheter placement into the Vasa recta arteries of the sigmoid colon demonstrates no extravasation, puddling, blush, AV shunting. Ultimately no site of extravasation was identified, and this includes after provocative maneuver with nitroglycerin . No embolization was performed. IMPRESSION: Status post ultrasound guided access right common femoral artery for mesenteric angiogram of the IMA distribution including super selective catheter position into multiple arteries of interest, provocative maneuvers, and ultimately negative angiogram. Celt for hemostasis Signed, Marciano Settles. Rexine Cater, RPVI [facility] Radiology Electronically Signed   By: Myrlene Asper D.O.   On: 12/14/2023 08:41   IR Angiogram Selective Each Additional Vessel Result Date: 12/14/2023 INDICATION: 68 year old female with lower GI bleeding, positive CT and hemodynamically unstable presents for angiogram and possible treatment EXAM: IR EMBO ART VEN HEMORR LYMPH EXTRAV INC GUIDE ROADMAPPING; ADDITIONAL ARTERIOGRAPHY; SELECTIVE VISCERAL ARTERIOGRAPHY; IR  ULTRASOUND GUIDANCE VASC ACCESS RIGHT MEDICATIONS: 100 mcg intra arterial nitroglycerin  ANESTHESIA/SEDATION: Moderate (conscious) sedation was not employed during this procedure. A total of Versed  0 mg and Fentanyl  100 mcg was administered intravenously. Moderate Sedation Time: 0 minutes. The patient's level of consciousness and vital signs were monitored continuously by radiology nursing throughout the procedure under my direct supervision. CONTRAST:  50mL OMNIPAQUE  IOHEXOL  300 MG/ML SOLN, 50mL OMNIPAQUE  IOHEXOL  300 MG/ML SOLN FLUOROSCOPY: Radiation Exposure Index (as provided by the fluoroscopic device): 3,970 mGy Kerma COMPLICATIONS: None PROCEDURE: Informed consent was obtained from the patient following explanation of the procedure, risks, benefits and alternatives. The patient understands, agrees and consents for the procedure. All questions were addressed. A time out was performed prior to the initiation of the procedure. Maximal barrier sterile technique utilized including caps, mask, sterile gowns, sterile gloves, large sterile drape, hand hygiene, and Betadine prep. Ultrasound survey of the right inguinal region was performed with images stored and sent  to PACs, confirming patency of the vessel. A micropuncture needle was used access the right common femoral artery under ultrasound. With excellent arterial blood flow returned, and an .018 micro wire was passed through the needle, observed enter the abdominal aorta under fluoroscopy. The needle was removed, and a micropuncture sheath was placed over the wire. The inner dilator and wire were removed, and an 035 Bentson wire was advanced under fluoroscopy into the abdominal aorta. The sheath was removed and a standard 5 [place] vascular sheath was placed. The dilator was removed and the sheath was flushed. A Mickelson catheter was passed on the Bentson wire into the aorta, with the recur for shaped within the thoracic aorta. Catheter was then withdrawn into  the abdominal aorta and used to select the inferior mesenteric artery origin. Angiogram was performed. A penumbra lantern catheter was advanced through the [facility] catheter using 14 fathom wire, to select the first sigmoid artery branch. Angiogram was performed. Catheter was withdrawn into the IMA, and then wire was used to select the the superior rectal/hemorrhoidal artery. Angiogram was performed.  Multiple obliquities obtained. Catheter was then withdrawn into the IMA, with the wire used to select a separate sigmoid artery branch, that supplied a marginal artery of the redundant sigmoid. Angiogram was performed. Super selective catheter position was then performed into the Vasa recta of the sigmoid artery branch. Angiogram was performed. Catheter was repositioned into the redundant loop of the sigmoid artery system. Angiogram was performed. After review of CT imaging in the IR suite, the catheter was again position into the sigmoid arteries of interest. Once we confirmed location, 100 mcg of nitroglycerin  was infused intra arterial as a provocative maneuver in the area of interest. Repeat angiogram was performed after intra arterial nitroglycerin . All images were reviewed and we elected to terminate. Catheters and wires removed. A Celt was deployed for hemostasis. Patient was hemodynamically stable throughout the procedure. Tolerated well with no blood loss. FINDINGS: Ultrasound demonstrates patent right common femoral artery without significant atherosclerotic changes. Inferior mesenteric artery angiogram demonstrates no significant atherosclerotic changes with the IMA supplying a mesenteric artery, superior rectal arteries, sigmoid arteries. No extravasation, puddling, blush identified on the initial angiogram. Sigmoid artery angiogram in the redundant loops of sigmoid demonstrates no puddling of contrast, staining, pseudoaneurysm, blush. No arteriovenous shunting. Superior rectal angiogram demonstrates no  extravasation, puddling, blush, AV shunting. Retained stool in the rectum. The second sigmoid artery angiogram demonstrates no extravasation, puddling, blush, AV shunting. Super selective catheter placement into the Vasa recta arteries of the sigmoid colon demonstrates no extravasation, puddling, blush, AV shunting. Ultimately no site of extravasation was identified, and this includes after provocative maneuver with nitroglycerin . No embolization was performed. IMPRESSION: Status post ultrasound guided access right common femoral artery for mesenteric angiogram of the IMA distribution including super selective catheter position into multiple arteries of interest, provocative maneuvers, and ultimately negative angiogram. Celt for hemostasis Signed, Marciano Settles. Rexine Cater, RPVI [facility] Radiology Electronically Signed   By: Myrlene Asper D.O.   On: 12/14/2023 08:41   DG CHEST PORT 1 VIEW Result Date: 12/13/2023 CLINICAL DATA:  Hypoxia EXAM: PORTABLE CHEST 1 VIEW COMPARISON:  11/01/2021 FINDINGS: Cardiac shadow is enlarged. Aortic calcifications are again noted. The lungs are well aerated bilaterally. No focal infiltrate or effusion is seen. No bony abnormality is noted. IMPRESSION: No active disease. Electronically Signed   By: Violeta Grey M.D.   On: 12/13/2023 20:12   CT ANGIO GI  BLEED Result Date: 12/13/2023 CLINICAL DATA:  Lower GI bleed.  Bloody stools. EXAM: CTA ABDOMEN AND PELVIS WITHOUT AND WITH CONTRAST TECHNIQUE: Multidetector CT imaging of the abdomen and pelvis was performed using the standard protocol during bolus administration of intravenous contrast. Multiplanar reconstructed images and MIPs were obtained and reviewed to evaluate the vascular anatomy. RADIATION DOSE REDUCTION: This exam was performed according to the departmental dose-optimization program which includes automated exposure control, adjustment of the mA and/or kV according to  patient size and/or use of iterative reconstruction technique. CONTRAST:  OMNIPAQUE  IOHEXOL  350 MG/ML SOLN COMPARISON:  CT abdomen pelvis dated 04/07/2023. FINDINGS: Evaluation is limited due to body habitus and severe streak artifact caused by patient's arms. VASCULAR Aorta: Mild atherosclerotic calcification. No aneurysmal dilatation or dissection. No periaortic fluid collection. Celiac: The celiac trunk and its major branches appear patent. SMA: The SMA is patent. Renals: The renal arteries appear patent. IMA: The IMA is patent. Inflow: Mild atherosclerotic calcification of the iliac arteries. The iliac arteries are patent. No aneurysmal dilatation or dissection. Proximal Outflow: The visualized proximal outflow is patent. Veins: The IVC is unremarkable. No portal venous gas. The SMV, splenic vein, and main portal vein are patent. Review of the MIP images confirms the above findings. NON-VASCULAR Lower chest: The visualized lung bases are clear. No intra-abdominal free air or free fluid. Hepatobiliary: The liver is unremarkable. No biliary dilatation. Multiple gallstones. No pericholecystic fluid or evidence of acute cholecystitis by CT. Pancreas: Unremarkable. No pancreatic ductal dilatation or surrounding inflammatory changes. Spleen: Normal in size without focal abnormality. Adrenals/Urinary Tract: The adrenal glands unremarkable. Mild bilateral renal parenchyma atrophy. Indeterminate bilateral renal hypodense lesions not characterized on this CT. There is no hydronephrosis on either side. The urinary bladder is grossly unremarkable. Stomach/Bowel: There is sigmoid diverticulosis with muscular hypertrophy. Contrast pooling in the sigmoid colon consistent with diverticular bleed. There is no bowel obstruction. The appendix is normal. Lymphatic: Mild aortoiliac atherosclerotic disease. The IVC is unremarkable. No portal venous gas. There is no adenopathy. Reproductive: The uterus is anteverted. Two  intrauterine devices noted within the uterus, one of the which appears low in position. Other: None Musculoskeletal: Degenerative changes of the spine. IMPRESSION: 1. Active diverticular bleed of the sigmoid colon. 2. Cholelithiasis. 3. Two IUD with one low-lying intrauterine device. 4.  Aortic Atherosclerosis (ICD10-I70.0). Electronically Signed   By: Angus Bark M.D.   On: 12/13/2023 17:13   US  LIMITED ULTRASOUND INCLUDING AXILLA RIGHT BREAST Result Date: 11/20/2023 CLINICAL DATA:  Six-month follow-up ultrasound of right breast mass. EXAM: ULTRASOUND OF THE RIGHT BREAST COMPARISON:  04/05/2023 FINDINGS: Targeted sonographic evaluation of the right breast again demonstrates an oval circumscribed hypoechoic solid mass measuring 1.4 x 0.7 x 0.7 cm at 10 o'clock 12 CMFN. This mass measured 1.5 x 0.6 x 1.4 cm on the prior ultrasound from 04/05/2023. IMPRESSION: Probably benign oval hypoechoic right breast mass at 10 o'clock 12 CMFN, unchanged from prior examination from 04/05/2023. RECOMMENDATION: Bilateral breast ultrasound in 6 months. I have discussed the findings and recommendations with the patient. If applicable, a reminder letter will be sent to the patient regarding the next appointment. BI-RADS CATEGORY  3: Probably benign. Electronically Signed   By: Elester Grim M.D.   On: 11/20/2023 10:12   Labs:   Basic Metabolic Panel: Recent Labs  Lab 12/13/23 1018 12/13/23 1930 12/14/23 0308 12/15/23 0530  NA 138  --  137 136  K 4.8  --  3.5 3.6  CL 105  --  104 105  CO2 23  --  22 22  GLUCOSE 121*  --  134* 130*  BUN 33*  --  32* 21  CREATININE 0.88  --  0.56 0.81  CALCIUM 8.4*  --  8.0* 8.1*  MG  --  1.5* 1.9  --   PHOS  --  3.2 3.3  --      CBC: Recent Labs  Lab 12/13/23 1018 12/13/23 1930 12/14/23 1316 12/14/23 1901 12/15/23 0530 12/15/23 1700 12/16/23 0926  WBC 8.8   < > 15.8* 13.1* 10.0 11.6* 9.5  NEUTROABS 6.0  --   --   --   --   --   --   HGB 9.1*   < > 8.2* 7.7* 7.8*  7.7* 7.9*  HCT 32.1*   < > 27.1* 26.5* 26.3* 26.3* 26.2*  MCV 77.2*   < > 76.3* 79.1* 78.3* 77.6* 77.3*  PLT 328   < > 293 258 273 284 303   < > = values in this interval not displayed.         SIGNED:   Tyriq Moragne, MD  [facility] 12/16/2023, 11:37 AM Time taking on discharge: 50 minutes

## 2023-12-17 LAB — TYPE AND SCREEN
ABO/RH(D): A POS
Antibody Screen: NEGATIVE
Unit division: 0
Unit division: 0
Unit division: 0
Unit division: 0

## 2023-12-17 LAB — BPAM RBC
Blood Product Expiration Date: 202506062359
Blood Product Expiration Date: 202506062359
Blood Product Expiration Date: 202506062359
Blood Product Expiration Date: 202506072359
ISSUE DATE / TIME: 202505082012
Unit Type and Rh: 6200
Unit Type and Rh: 6200
Unit Type and Rh: 6200
Unit Type and Rh: 6200

## 2023-12-21 ENCOUNTER — Ambulatory Visit: Payer: 59 | Admitting: Gynecologic Oncology

## 2024-01-04 ENCOUNTER — Ambulatory Visit

## 2024-01-04 NOTE — Progress Notes (Signed)
 Cardiology Office Note:    Date:  01/07/2024   ID:  Zoe Cobb, DOB 1955/10/07, MRN [id number]  PCP:  Thomasene Flemings, NP    [facility]Care Providers Cardiologist:  Knox Perl, MD     Referring MD: Thomasene Flemings, NP   Chief Complaint  Patient presents with   Follow-up    PAF    History of Present Illness:    Zoe Cobb is a 68 y.o. female with a hx of chronic diastolic heart failure, hypertension, paroxysmal atrial fibrillation diagnosed 09/2016, morbid obesity wheelchair-bound (prior BMI listed as 81), chronic hepatitis C, prior GI bleed, and endometrial cancer.  She was previously followed by [facility] cardiology and our anticoagulation clinic. She had vaginal bleeding on xarelto  and GI bleed x 2 in the setting of supratherapeutic INR, now not on anticoagulation.   She was hospitalized January 2023 with GI bleed.  INR was supratherapeutic on admission and she was reversed with vitamin K and Kcentra .  Cardiology consulted for tachycardia and hypotension.  Hemodynamics felt secondary to acute GI bleed and anemia. Hospitalized 09/2022 with GI bleed thought secondary to diverticulum and INR 3.5.  She was hospitalized 11/2022 with GI bleed and INR 4.5.  She was hospitalized again 03/2023 with vaginal bleeding. OP pelvic US  showed 14 cm mass. 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 After coumadin  restarted (held for procedure), she had heavy bleeding with clots requiring admission.   She was again hospitalized 12/2023 with lower GI bleed and hemorrhagic shock.  She was treated with PRBCs and was noted to be tachycardic and hypotensive.  Home dose of 75 mg of Toprol  was held due to hypotension, this has been restarted.   She presents for cardiology follow-up today. She sleeps on a hospital bed for elevation, but no reports of SOB. She is no longer on coumadin  and avoiding greens. No further signs of  bleeding. She is tachycardic today, will increase toprol  to 100 mg.    Past Medical History:  Diagnosis Date   Arthritis    CHF (congestive heart failure) (HCC)    Diabetes mellitus without complication (HCC)    metformin   Dysrhythmia    A-fib   Hypertension    Morbid obesity (HCC)    Multiple sclerosis (HCC)    asymptomatic   Paroxysmal atrial fibrillation [facility])     Past Surgical History:  Procedure Laterality Date   COLONOSCOPY WITH PROPOFOL  N/A 12/15/2020   Procedure: COLONOSCOPY WITH PROPOFOL ;  Surgeon: Asencion Blacksmith, MD;  Location: [facility] ENDOSCOPY;  Service: Endoscopy;  Laterality: N/A;   DILATATION & CURETTAGE/HYSTEROSCOPY WITH MYOSURE N/A 04/04/2023   Procedure: DILATATION & CURETTAGE/HYSTEROSCOPY WITH MYOSURE;  Surgeon: Suzi Essex, MD;  Location: [facility] ORS;  Service: Gynecology;  Laterality: N/A;   HYSTEROSCOPY WITH D & C N/A 03/12/2023   Procedure: DIAGNOSTIC HYSTEROSCOPY, ENDOMETRIAL BIOPSY;  Surgeon: Kiki Pelton, MD;  Location: [facility] ORS;  Service: Gynecology;  Laterality: N/A;   INTRAUTERINE DEVICE (IUD) INSERTION N/A 04/04/2023   Procedure: INTRAUTERINE DEVICE (IUD) INSERTION MIRENA , TILT TEST;  Surgeon: Suzi Essex, MD;  Location: [facility] ORS;  Service: Gynecology;  Laterality: N/A;   IR ANGIOGRAM SELECTIVE EACH ADDITIONAL VESSEL  12/13/2023   IR ANGIOGRAM SELECTIVE EACH ADDITIONAL VESSEL  12/13/2023   IR ANGIOGRAM SELECTIVE EACH ADDITIONAL VESSEL  12/13/2023   IR ANGIOGRAM SELECTIVE EACH ADDITIONAL VESSEL  12/13/2023   IR ANGIOGRAM VISCERAL SELECTIVE  12/13/2023   IR RADIOLOGIST EVAL & MGMT  12/14/2023   IR US  GUIDE VASC ACCESS RIGHT  12/13/2023    Current Medications: Current Meds  Medication Sig   acetaminophen  (TYLENOL ) 500 MG tablet Take 500 mg by mouth every 6 (six) hours as needed.   metFORMIN (GLUCOPHAGE) 1000 MG tablet Take 1,000 mg by mouth 2 (two) times daily.   methocarbamol (ROBAXIN) 500 MG tablet Take 500 mg by mouth 2 (two) times daily as needed for  muscle spasms.   metoprolol  succinate (TOPROL -XL) 100 MG 24 hr tablet Take 1 tablet (100 mg total) by mouth daily. Take with or immediately following a meal.   vitamin B-12 (CYANOCOBALAMIN ) 500 MCG tablet Take 500 mcg by mouth daily.   [DISCONTINUED] metoprolol  succinate (TOPROL -XL) 50 MG 24 hr tablet Take 75 mg by mouth daily.     Allergies:   Penicillins   Social History   Socioeconomic History   Marital status: Married    Spouse name: Not on file   Number of children: 2   Years of education: Not on file   Highest education level: Not on file  Occupational History   Not on file  Tobacco Use   Smoking status: Former    Current packs/day: 0.00    Average packs/day: 1 pack/day for 9.0 years (9.0 ttl pk-yrs)    Types: Cigarettes    Start date: 43    Quit date: 22    Years since quitting: 29.4   Smokeless tobacco: Never  Vaping Use   Vaping status: Never Used  Substance and Sexual Activity   Alcohol use: No    Alcohol/week: 0.0 standard drinks of alcohol   Drug use: No   Sexual activity: Not on file  Other Topics Concern   Not on file  Social History Narrative   Pt lives by herself, she completed hs.    Social Drivers of Corporate investment banker Strain: Not on file  Food Insecurity: No Food Insecurity (12/14/2023)   Hunger Vital Sign    Worried About Running Out of Food in the Last Year: Never true    Ran Out of Food in the Last Year: Never true  Transportation Needs: No Transportation Needs (12/14/2023)   PRAPARE - Administrator, Civil Service (Medical): No    Lack of Transportation (Non-Medical): No  Physical Activity: Not on file  Stress: Not on file  Social Connections: Socially Integrated (12/14/2023)   Social Connection and Isolation Panel [NHANES]    Frequency of Communication with Friends and Family: More than three times a week    Frequency of Social Gatherings with Friends and Family: Once a week    Attends Religious Services: 1 to 4 times  per year    Active Member of [employer] or Organizations: No    Attends Engineer, structural: 1 to 4 times per year    Marital Status: Married     Family History: The patient's family history includes Diabetes in her brother, mother, sister, sister, and sister; HIV/AIDS in her brother; Heart attack in her brother; Hypertension in her mother; Kidney disease in her sister; Prostate cancer in her brother; Stomach cancer in her father. There is no history of Colon cancer, Breast cancer, Ovarian cancer, Endometrial cancer, or Pancreatic cancer.  ROS:   Please see the history of present illness.     All other systems reviewed and are negative.  EKGs/Labs/Other Studies Reviewed:    The following studies were reviewed today:       Recent Labs: 12/14/2023:  ALT 12; Magnesium  1.9 12/15/2023: BUN 21; Creatinine, Ser 0.81; Potassium 3.6; Sodium 136 12/16/2023: Hemoglobin 7.9; Platelets 303  Recent Lipid Panel    Component Value Date/Time   CHOL 139 08/28/2016 1401   TRIG 53 08/28/2016 1401   HDL 58 08/28/2016 1401   CHOLHDL 2.4 08/28/2016 1401   VLDL 11 08/28/2016 1401   LDLCALC 70 08/28/2016 1401     Risk Assessment/Calculations:    CHA2DS2-VASc Score = 5   This indicates a 7.2% annual risk of stroke. The patient's score is based upon: CHF History: 1 HTN History: 1 Diabetes History: 1 Stroke History: 0 Vascular Disease History: 0 Age Score: 1 Gender Score: 1              Physical Exam:    VS:  BP 138/70 (BP Location: Left Arm, Patient Position: Sitting)   Pulse (!) 110   Ht 5\' 1"  (1.549 m)   Wt (!) 405 lb (183.7 kg)   SpO2 91%   BMI 76.52 kg/m     Wt Readings from Last 3 Encounters:  01/07/24 (!) 405 lb (183.7 kg)  12/13/23 (!) 423 lb 8.1 oz (192.1 kg)  09/08/23 (!) 418 lb 14 oz (190 kg)     GEN: morbidly obese female in electric wheelchair, NAD CARDIAC: regular rhythm, tachycardic rate RESPIRATORY:  Clear to auscultation without rales, wheezing or rhonchi   ABDOMEN: Soft, non-tender, non-distended MUSCULOSKELETAL:  LE swelling, volume status difficult  SKIN: Warm and dry NEUROLOGIC:  Alert and oriented x 3 PSYCHIATRIC:  Normal affect   ASSESSMENT:    1. Paroxysmal atrial fibrillation (HCC)   2. Chronic diastolic (congestive) heart failure (HCC)   3. Primary hypertension   4. Gastrointestinal hemorrhage associated with intestinal diverticulosis   5. Lower GI bleed   6. Endometrial cancer (HCC)   7. Morbid obesity with BMI of 70 and over, adult [facility])    PLAN:    In order of problems listed above:  Paroxysmal atrial flutter - 75 mg toprol  for rate control - she is unaware of her rhythm - tachycardic today, will increase toprol  to 100 mg daily   Chronic anticoagulation with Coumadin  Prior GI and GU bleeds - not currently on coumadin  - will defer to Dr. Berry Bristol on restarting OAC, but likely not a candidate given multiple GI and GU bleeds with endometrial cancer and hx of supratherapeutic INRs - likely not a candidate for watchman given BMI > 70   Hypertension - BP controlled - on BB alone   HFpEF Morbid obesity Wheel-chair bound - volume status difficult - she denies SOB   Follow up with Dr. Berry Bristol in 6 months.   ADDENDUM: Per Dr. Berry Bristol: I totally agree with you, please discontinue warfarin.     Medication Adjustments/Labs and Tests Ordered: Current medicines are reviewed at length with the patient today.  Concerns regarding medicines are outlined above.  No orders of the defined types were placed in this encounter.  Meds ordered this encounter  Medications   metoprolol  succinate (TOPROL -XL) 100 MG 24 hr tablet    Sig: Take 1 tablet (100 mg total) by mouth daily. Take with or immediately following a meal.    Dispense:  90 tablet    Refill:  3    Patient Instructions  Medication Instructions:  Increase Metoprolol  Succinate 100 mg daily   *If you need a refill on your cardiac medications before your next  appointment, please call your pharmacy*  Lab Work: NONE ordered at this time  of appointment   Testing/Procedures: NONE ordered at this time of appointment   Follow-Up: At [facility], you and your health needs are our priority.  As part of our continuing mission to provide you with exceptional heart care, our providers are all part of one team.  This team includes your primary Cardiologist (physician) and Advanced Practice Providers or APPs (Physician Assistants and Nurse Practitioners) who all work together to provide you with the care you need, when you need it.  Your next appointment:    6 month ov (MD only)  Provider:   Knox Perl, MD    We recommend signing up for the patient portal called "MyChart".  Sign up information is provided on this After Visit Summary.  MyChart is used to connect with patients for Virtual Visits (Telemedicine).  Patients are able to view lab/test results, encounter notes, upcoming appointments, etc.  Non-urgent messages can be sent to your provider as well.   To learn more about what you can do with MyChart, go to [email or web address].           Signed, Lamond Pilot, [place]  01/07/2024 10:24 AM    [facility]

## 2024-01-07 ENCOUNTER — Ambulatory Visit: Attending: Physician Assistant | Admitting: Physician Assistant

## 2024-01-07 ENCOUNTER — Encounter: Payer: Self-pay | Admitting: Physician Assistant

## 2024-01-07 VITALS — BP 138/70 | HR 110 | Ht 61.0 in | Wt >= 6400 oz

## 2024-01-07 DIAGNOSIS — K5791 Diverticulosis of intestine, part unspecified, without perforation or abscess with bleeding: Secondary | ICD-10-CM

## 2024-01-07 DIAGNOSIS — Z6841 Body Mass Index (BMI) 40.0 and over, adult: Secondary | ICD-10-CM

## 2024-01-07 DIAGNOSIS — I5032 Chronic diastolic (congestive) heart failure: Secondary | ICD-10-CM

## 2024-01-07 DIAGNOSIS — K922 Gastrointestinal hemorrhage, unspecified: Secondary | ICD-10-CM | POA: Diagnosis not present

## 2024-01-07 DIAGNOSIS — I10 Essential (primary) hypertension: Secondary | ICD-10-CM

## 2024-01-07 DIAGNOSIS — I480 Paroxysmal atrial fibrillation: Secondary | ICD-10-CM | POA: Diagnosis not present

## 2024-01-07 DIAGNOSIS — C541 Malignant neoplasm of endometrium: Secondary | ICD-10-CM

## 2024-01-07 MED ORDER — METOPROLOL SUCCINATE ER 100 MG PO TB24: 100.0000 mg | ORAL_TABLET | Freq: Every day | ORAL | 3 refills | Status: AC

## 2024-01-07 NOTE — Patient Instructions (Signed)
 Medication Instructions:  Increase Metoprolol  Succinate 100 mg daily   *If you need a refill on your cardiac medications before your next appointment, please call your pharmacy*  Lab Work: NONE ordered at this time of appointment   Testing/Procedures: NONE ordered at this time of appointment   Follow-Up: At [facility], you and your health needs are our priority.  As part of our continuing mission to provide you with exceptional heart care, our providers are all part of one team.  This team includes your primary Cardiologist (physician) and Advanced Practice Providers or APPs (Physician Assistants and Nurse Practitioners) who all work together to provide you with the care you need, when you need it.  Your next appointment:    6 month ov (MD only)  Provider:   Knox Perl, MD    We recommend signing up for the patient portal called "MyChart".  Sign up information is provided on this After Visit Summary.  MyChart is used to connect with patients for Virtual Visits (Telemedicine).  Patients are able to view lab/test results, encounter notes, upcoming appointments, etc.  Non-urgent messages can be sent to your provider as well.   To learn more about what you can do with MyChart, go to [email or web address].

## 2024-01-31 ENCOUNTER — Encounter: Payer: Self-pay | Admitting: Gynecologic Oncology

## 2024-01-31 ENCOUNTER — Inpatient Hospital Stay: Attending: Gynecologic Oncology | Admitting: Gynecologic Oncology

## 2024-01-31 VITALS — BP 107/63 | HR 60 | Temp 97.6°F | Resp 17 | Ht 61.0 in

## 2024-01-31 DIAGNOSIS — Z975 Presence of (intrauterine) contraceptive device: Secondary | ICD-10-CM | POA: Insufficient documentation

## 2024-01-31 DIAGNOSIS — N939 Abnormal uterine and vaginal bleeding, unspecified: Secondary | ICD-10-CM | POA: Insufficient documentation

## 2024-01-31 DIAGNOSIS — C541 Malignant neoplasm of endometrium: Secondary | ICD-10-CM | POA: Diagnosis present

## 2024-01-31 DIAGNOSIS — Z79890 Hormone replacement therapy (postmenopausal): Secondary | ICD-10-CM | POA: Diagnosis not present

## 2024-01-31 DIAGNOSIS — Z87891 Personal history of nicotine dependence: Secondary | ICD-10-CM | POA: Diagnosis not present

## 2024-01-31 DIAGNOSIS — Z6841 Body Mass Index (BMI) 40.0 and over, adult: Secondary | ICD-10-CM

## 2024-01-31 MED ORDER — LETROZOLE 2.5 MG PO TABS: 2.5000 mg | ORAL_TABLET | Freq: Every day | ORAL | 12 refills | Status: AC

## 2024-01-31 NOTE — Progress Notes (Signed)
 Gynecologic Oncology Return Clinic Visit  01/31/24  Reason for Visit: follow-up  Treatment History: The patient was admitted in 10/2021 for anemia in the setting of vaginal bleeding with plan for outpatient follow-up within a week, which appears did not happen.  Patient was started on 40 mg of Megace  twice daily for a total of 7 days.   In January, the patient was seen as a new patient for menopausal bleeding for more than a year.  This was described as intermittent without associated pain or cramping.  Plan at that time was to proceed with EUA and D&C with ultrasound to assess endometrial lining.  Also discussed suppression with progesterone containing IUD.   The patient has had multiple admissions since for vaginal and/or rectal bleeding.  She was admitted in February for bleeding in the setting of an INR elevated at 3.5.  The patient was admitted in April with vaginal and rectal bleeding found to have a supratherapeutic INR in the setting of being on Coumadin  for A-fib.  She received vitamin K.  She was treated for diverticulitis at that time.  She was briefly admitted again in July after she presented with recurrent vaginal bleeding with an episode lasting for 3 days.  Her hemoglobin was noted to be 7.7.  This is in the setting of being on Coumadin  for A-fib with an INR of 3.6.  Her Coumadin  was held while he received vitamin K until INR decreased.  She was also given 2 units of packed red blood cells.   Pelvic ultrasound exam was performed on 03/01/2023 and this revealed a uterus measuring 5.7 x 7 x 13.4 cm with a 4 x 5 x 5.2 lesion measured in the uterus corresponding to a fundal leiomyoma better seen on prior CT scan.  Endometrium with markedly limited evaluation, measures up to 12 mm.  Neither ovary seen.   The patient was taken for hysteroscopy with endometrial sampling on 03/12/2023.  Findings at surgery included normal-appearing cervix with abundant friable tissue within a dilated cervical os.   Hysteroscopy was abandoned secondary to abundant tissue limiting view.  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   The patient held her Coumadin  prior to her recent procedure, restarted it on 8/29.  She endorses beginning to have vaginal bleeding and pelvic pain on 8/30 with passage of large blood clots.  This is large-volume vaginal bleeding.  On presentation to the emergency department, mildly hypotensive (blood pressures normally run low), heart rate in the 90s.  Hemoglobin 8.3 with an INR of 2.3.  Patient was admitted to the stepdown unit and transfused 1 unit of packed red blood cells.  She was also given Kcentra  to reverse her INR.  The setting of hypotension, CT scan was obtained to rule out source of infection.  This was notable for a blood clot in the cervix versus upper vagina.  She was started on Rocephin  and doxycycline  for possible endometritis.   EMB on 09/20/23: FIGO grade 1 endometrioid adenocarcinoma.  10/18/23: Second Mirena  IUD placed in clinic. Pelvic ultrasound obtained that day in clinic showing a uterus measuring 10.3 x 4.9 x 4.9 cm.  Endometrial lining measures 17 mm.  IUD evaluation is very limited but arms of the IUD appear in the lower uterus, possibly penetrating into the myometrium.  Interval History: Patient was hospitalized in May with significant  bleeding per rectum the setting of supratherapeutic INR on warfarin.  Coumadin  was discontinued.  She has subsequently seen cardiology for follow-up and is currently off anticoagulation in the setting of second GI bleed.  Today, notes that she is doing well.  Denies any further rectal bleeding.  Continues to have similar vaginal bleeding.  Denies any pelvic pain or cramping.  Past Medical/Surgical History: Past Medical History:   Diagnosis Date   Arthritis    CHF (congestive heart failure) (HCC)    Diabetes mellitus without complication (HCC)    metformin   Dysrhythmia    A-fib   Hypertension    Morbid obesity (HCC)    Multiple sclerosis (HCC)    asymptomatic   Paroxysmal atrial fibrillation [facility])     Past Surgical History:  Procedure Laterality Date   COLONOSCOPY WITH PROPOFOL  N/A 12/15/2020   Procedure: COLONOSCOPY WITH PROPOFOL ;  Surgeon: Aneita Gwendlyn DASEN, MD;  Location: [facility] ENDOSCOPY;  Service: Endoscopy;  Laterality: N/A;   DILATATION & CURETTAGE/HYSTEROSCOPY WITH MYOSURE N/A 04/04/2023   Procedure: DILATATION & CURETTAGE/HYSTEROSCOPY WITH MYOSURE;  Surgeon: Viktoria Comer SAUNDERS, MD;  Location: [facility] ORS;  Service: Gynecology;  Laterality: N/A;   HYSTEROSCOPY WITH D & C N/A 03/12/2023   Procedure: DIAGNOSTIC HYSTEROSCOPY, ENDOMETRIAL BIOPSY;  Surgeon: Jeralyn Crutch, MD;  Location: [facility] ORS;  Service: Gynecology;  Laterality: N/A;   INTRAUTERINE DEVICE (IUD) INSERTION N/A 04/04/2023   Procedure: INTRAUTERINE DEVICE (IUD) INSERTION MIRENA , TILT TEST;  Surgeon: Viktoria Comer SAUNDERS, MD;  Location: [facility] ORS;  Service: Gynecology;  Laterality: N/A;   IR ANGIOGRAM SELECTIVE EACH ADDITIONAL VESSEL  12/13/2023   IR ANGIOGRAM SELECTIVE EACH ADDITIONAL VESSEL  12/13/2023   IR ANGIOGRAM SELECTIVE EACH ADDITIONAL VESSEL  12/13/2023   IR ANGIOGRAM SELECTIVE EACH ADDITIONAL VESSEL  12/13/2023   IR ANGIOGRAM VISCERAL SELECTIVE  12/13/2023   IR RADIOLOGIST EVAL & MGMT  12/14/2023   IR US  GUIDE VASC ACCESS RIGHT  12/13/2023    Family History  Problem Relation Age of Onset   Hypertension Mother    Diabetes Mother    Stomach cancer Father    Kidney disease Sister    Diabetes Sister    Diabetes Sister    Diabetes Sister    Heart attack Brother    HIV/AIDS Brother    Diabetes Brother    Prostate cancer Brother    Colon cancer Neg Hx    Breast cancer Neg Hx    Ovarian cancer Neg Hx    Endometrial cancer Neg Hx    Pancreatic cancer  Neg Hx     Social History   Socioeconomic History   Marital status: Married    Spouse name: Not on file   Number of children: 2   Years of education: Not on file   Highest education level: Not on file  Occupational History   Not on file  Tobacco Use   Smoking status: Former    Current packs/day: 0.00    Average packs/day: 1 pack/day for 9.0 years (9.0 ttl pk-yrs)    Types: Cigarettes    Start date: 65    Quit date: 1996    Years since quitting: 29.5   Smokeless tobacco: Never  Vaping Use   Vaping status: Never Used  Substance and Sexual Activity   Alcohol use: No    Alcohol/week: 0.0 standard drinks of alcohol   Drug use: No   Sexual activity: Not on file  Other Topics Concern   Not on file  Social History Narrative  Pt lives by herself, she completed hs.    Social Drivers of Corporate investment banker Strain: Not on file  Food Insecurity: No Food Insecurity (12/14/2023)   Hunger Vital Sign    Worried About Running Out of Food in the Last Year: Never true    Ran Out of Food in the Last Year: Never true  Transportation Needs: No Transportation Needs (12/14/2023)   PRAPARE - Administrator, Civil Service (Medical): No    Lack of Transportation (Non-Medical): No  Physical Activity: Not on file  Stress: Not on file  Social Connections: Socially Integrated (12/14/2023)   Social Connection and Isolation Panel    Frequency of Communication with Friends and Family: More than three times a week    Frequency of Social Gatherings with Friends and Family: Once a week    Attends Religious Services: 1 to 4 times per year    Active Member of [employer] or Organizations: No    Attends Engineer, structural: 1 to 4 times per year    Marital Status: Married    Current Medications:  Current Outpatient Medications:    acetaminophen  (TYLENOL ) 500 MG tablet, Take 500 mg by mouth every 6 (six) hours as needed., Disp: , Rfl:    letrozole (FEMARA) 2.5 MG tablet, Take 1  tablet (2.5 mg total) by mouth daily., Disp: 30 tablet, Rfl: 12   metFORMIN (GLUCOPHAGE) 1000 MG tablet, Take 1,000 mg by mouth 2 (two) times daily., Disp: , Rfl:    methocarbamol (ROBAXIN) 500 MG tablet, Take 500 mg by mouth 2 (two) times daily as needed for muscle spasms., Disp: , Rfl:    metoprolol  succinate (TOPROL -XL) 100 MG 24 hr tablet, Take 1 tablet (100 mg total) by mouth daily. Take with or immediately following a meal., Disp: 90 tablet, Rfl: 3   vitamin B-12 (CYANOCOBALAMIN ) 500 MCG tablet, Take 500 mcg by mouth daily., Disp: , Rfl:    levonorgestrel  (MIRENA ) 20 MCG/DAY IUD, Intrauterine device to be placed in office., Disp: 1 each, Rfl: 0  Review of Systems: Denies appetite changes, fevers, chills, fatigue, unexplained weight changes. Denies hearing loss, neck lumps or masses, mouth sores, ringing in ears or voice changes. Denies cough or wheezing.  Denies shortness of breath. Denies chest pain or palpitations. Denies leg swelling. Denies abdominal distention, pain, blood in stools, constipation, diarrhea, nausea, vomiting, or early satiety. Denies pain with intercourse, dysuria, frequency, hematuria or incontinence. Denies hot flashes, pelvic pain.   Denies joint pain, back pain or muscle pain/cramps. Denies itching, rash, or wounds. Denies dizziness, headaches, numbness or seizures. Denies swollen lymph nodes or glands, denies easy bruising or bleeding. Denies anxiety, depression, confusion, or decreased concentration.  Physical Exam: BP 107/63 (BP Location: Left Wrist, Patient Position: Sitting)   Pulse 60   Temp 97.6 F (36.4 C) (Tympanic)   Resp 17   Ht 5' 1 (1.549 m)   SpO2 98%   BMI 76.52 kg/m  General: Alert, oriented, no acute distress. HEENT: Posterior oropharynx clear, sclera anicteric. Chest: Unlabored breathing on room air.  GU: Normal appearing external genitalia without erythema, excoriation, or lesions.  Speculum exam deferred.  On bimanual exam, I am  able to palpate what feels like 2 sets of IUD strings.  The cervix itself is soft without firmness or nodularity.  Unable to appreciate the size of uterus given body habitus.  Endometrial biopsy procedure Preoperative diagnosis: low grade endometrial cancer Postoperative diagnosis: Same as above Physician: Viktoria MD  Estimated blood loss: Minimal Specimens: Endometrial biopsy Procedure: After the procedure was discussed with the patient, she gave verbal consent.  Using the [facility] lift, she was then moved from her wheelchair to the exam table.  She was left in the [place] lift for the procedure.  Legs were elevated.  Washcloths were used to clean the external genitalia.  Betadine was used x 3 to swab the upper vagina blindly.  An endometrial Pipelle was then passed until resistance met over my finger.  1 pass was performed with sufficient tissue obtained.  This was placed in formalin.  Overall the patient tolerated the procedure well.   Laboratory & Radiologic Studies: None new  Assessment & Plan: Zoe Cobb is a 68 y.o. 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   IUD placed at the time of D&C on 04/04/23. MMRp, p53 WT. Most recent biopsy in 10/2023 showed persistent FIGO grade 1 well-differentiated endometrioid adenocarcinoma.  Second IUD placed on 10/18/2023.   Patient is doing well.  Continues to have persistent vaginal bleeding.  Endometrial biopsy performed today.  Given continued bleeding with significant estrogen exposure due to obesity, discussed starting letrozole.  Prescription for this was sent to her pharmacy today.  We discussed medication itself and some patient information was printed for her.  Importantly, we discussed the small but increased risk of VTE with high doses of oral progesterone.  Especially now in the setting of her discontinuation of anticoagulation, I think letrozole is safer than oral progesterone.  I will call the patient with  biopsy results.  We will tentatively plan on follow-up in 3 months.  28 minutes of total time was spent for this patient encounter, including preparation, face-to-face counseling with the patient and coordination of care, and documentation of the encounter.  Comer Dollar, MD  Division of Gynecologic Oncology  Department of Obstetrics and Gynecology  [facility]

## 2024-01-31 NOTE — Patient Instructions (Signed)
 It was good to see you today. I will let you know when I get your biopsy results.  We will plan to start letrozole as we discussed today. I will see you back for follow-up in 3 months.

## 2024-01-31 NOTE — Addendum Note (Signed)
 Addended by: Janifer Gieselman R on: 01/31/2024 01:38 PM   Modules accepted: Orders

## 2024-02-05 LAB — SURGICAL PATHOLOGY

## 2024-02-06 ENCOUNTER — Telehealth: Payer: Self-pay | Admitting: Gynecologic Oncology

## 2024-02-06 NOTE — Telephone Encounter (Signed)
 Called patient to discuss biopsy results - no answer. Left VM requesting call back.  Comer Dollar MD Gynecologic Oncology

## 2024-02-06 NOTE — Progress Notes (Signed)
Please see anticoagulation encounter.

## 2024-02-19 ENCOUNTER — Ambulatory Visit: Payer: Self-pay | Admitting: Gynecologic Oncology

## 2024-02-19 NOTE — Telephone Encounter (Signed)
 Attempted to reach patient. Left voicemail requesting call back.   Patient returned office call. Relayed message from Dr. Viktoria that she has tried multiple times to reach patient. Biopsy results show persistent grade one endometrial cancer. Dr. Viktoria is hoping that the addition of letrozole  will help us  be more likely to see cancer regression/improvement.  Zoe Cobb verbalized understanding and thanked the office for calling. Pt reminded of her 9/25 follow up appt. With Dr. Viktoria.

## 2024-02-19 NOTE — Telephone Encounter (Signed)
-----   Message from Comer JONELLE Dollar sent at 02/19/2024  6:47 AM EDT ----- [place], could you please call the patient to try to give her biopsy results? There is persistent grade one endometrial cancer. I am hoping that the addition of letrozole  will help us  be more likely  to see cancer regression/improvement. Please let her know I have tried multiple times to call her with these results. Thank you! ----- Message ----- From: Interface, Lab In Three [facility] One Sent: 02/04/2024   5:49 PM EDT To: Comer JONELLE Dollar, MD

## 2024-02-28 ENCOUNTER — Telehealth: Payer: Self-pay | Admitting: Oncology

## 2024-02-28 ENCOUNTER — Other Ambulatory Visit: Payer: Self-pay | Admitting: Gynecologic Oncology

## 2024-02-28 DIAGNOSIS — C541 Malignant neoplasm of endometrium: Secondary | ICD-10-CM

## 2024-02-28 NOTE — Telephone Encounter (Signed)
 [facility] called and they were not aware of Rx request, they see the patient has 11 refills.

## 2024-02-28 NOTE — Telephone Encounter (Signed)
 Called Zoe Cobb regarding the refill request on letrozole .  Advised her she should have refills available on her prescription.  She said she knew about the refills and will pick it up this afternoon from the pharmacy.  She is not sure why the refill request was sent to Dr. Viktoria.

## 2024-03-12 ENCOUNTER — Encounter: Payer: Self-pay | Admitting: Adult Health

## 2024-04-21 ENCOUNTER — Telehealth: Payer: Self-pay | Admitting: *Deleted

## 2024-04-21 NOTE — Telephone Encounter (Signed)
 Per provider moved appt from 9/25 to 10/3. Patient aware

## 2024-05-01 ENCOUNTER — Ambulatory Visit: Admitting: Gynecologic Oncology

## 2024-05-09 ENCOUNTER — Encounter: Payer: Self-pay | Admitting: Gynecologic Oncology

## 2024-05-09 ENCOUNTER — Other Ambulatory Visit (HOSPITAL_COMMUNITY): Payer: Self-pay | Admitting: Gynecologic Oncology

## 2024-05-09 ENCOUNTER — Inpatient Hospital Stay: Attending: Gynecologic Oncology | Admitting: Gynecologic Oncology

## 2024-05-09 ENCOUNTER — Inpatient Hospital Stay

## 2024-05-09 ENCOUNTER — Telehealth (HOSPITAL_BASED_OUTPATIENT_CLINIC_OR_DEPARTMENT_OTHER): Payer: Self-pay | Admitting: *Deleted

## 2024-05-09 ENCOUNTER — Telehealth: Payer: Self-pay | Admitting: *Deleted

## 2024-05-09 VITALS — BP 104/71 | HR 108 | Temp 97.7°F | Resp 20

## 2024-05-09 DIAGNOSIS — Z6841 Body Mass Index (BMI) 40.0 and over, adult: Secondary | ICD-10-CM | POA: Insufficient documentation

## 2024-05-09 DIAGNOSIS — Z79811 Long term (current) use of aromatase inhibitors: Secondary | ICD-10-CM | POA: Diagnosis not present

## 2024-05-09 DIAGNOSIS — C541 Malignant neoplasm of endometrium: Secondary | ICD-10-CM | POA: Insufficient documentation

## 2024-05-09 NOTE — Patient Instructions (Signed)
 Preparing for your Surgery   Plan for surgery on May 29, 2024 with Dr. Comer Dollar at [facility]. You will be scheduled for dilation and curettage of the uterus with hysteroscopy and myosure, Mirena  IUD placement.   Pre-operative Testing -You will receive a phone call from presurgical testing at [facility] to discuss surgery instructions and arrange for lab work if needed.   -Bring your insurance card, copy of an advanced directive if applicable, medication list.   -You should not be taking blood thinners or aspirin  at least ten days prior to surgery unless instructed by your surgeon.   -Do not take supplements such as fish oil (omega 3), red yeast rice, turmeric before your surgery. You want to avoid medications with aspirin  in them including headache powders such as BC or Goody's), Excedrin migraine.   Day Before Surgery at Home -You will be advised you can have clear liquids up until 3 hours before your surgery.     Your role in recovery Your role is to become active as soon as directed by your doctor, while still giving yourself time to heal.  Rest when you feel tired. You will be asked to do the following in order to speed your recovery:   - Cough and breathe deeply. This helps to clear and expand your lungs and can prevent pneumonia after surgery.  - STAY ACTIVE WHEN YOU GET HOME. Do mild physical activity. Walking or moving your legs help your circulation and body functions return to normal. Do not try to get up or walk alone the first time after surgery.   -If you develop swelling on one leg or the other, pain in the back of your leg, redness/warmth in one of your legs, please call the office or go to the Emergency Room to have a doppler to rule out a blood clot. For shortness of breath, chest pain-seek care in the Emergency Room as soon as possible. - Actively manage your pain. Managing your pain lets you move in comfort. We will ask you to rate your pain  on a scale of zero to 10. It is your responsibility to tell your doctor or nurse where and how much you hurt so your pain can be treated.   Special Considerations -Your final pathology results from surgery should be available around one week after surgery and the results will be relayed to you when available.   -FMLA forms can be faxed to [phone] and please allow 5-7 business days for completion.   Pain Management After Surgery -Make sure that you have Tylenol  and Ibuprofen at home IF YOU ARE ABLE TO TAKE THESE MEDICATIONS to use on a regular basis after surgery for pain control.     Bowel Regimen -It is important to prevent constipation and drink adequate amounts of liquids. You can use a laxative as needed for constipation.   Risks of Surgery Risks of surgery are low but include bleeding, infection, damage to surrounding structures, re-operation, blood clots, and very rarely death.   AFTER SURGERY INSTRUCTIONS   Return to work:  1-2 days if applicable   Activity: 1. Be up and out of the bed during the day.  Take a nap if needed.  You may walk up steps but be careful and use the hand rail.  Stair climbing will tire you more than you think, you may need to stop part way and rest.    2. No lifting or straining for 2 weeks over 10 pounds.  No pushing, pulling, straining for 2 weeks.   3. No driving for minimum 24 hours after surgery.  Do not drive if you are taking narcotic pain medicine and make sure that your reaction time has returned.    4. You can shower as soon as the next day after surgery. Shower daily. No tub baths or submerging your body in water until cleared by your surgeon (2 weeks minimum). If you have the soap that was given to you by pre-surgical testing that was used before surgery, you do not need to use it afterwards because this can irritate your incisions.    5. No sexual activity and nothing in the vagina for 2 weeks.   6. You may experience vaginal spotting and  discharge after surgery.  The spotting is normal but if you experience heavy bleeding, call our office.   7. Take Tylenol  and ibuprofen for pain if you are able to take these medications for discomfort as needed.     Diet: 1. Low sodium Heart Healthy Diet is recommended but you are cleared to resume your normal (before surgery) diet after your procedure.   2. It is safe to use a laxative, such as Miralax  or Colace, if you have difficulty moving your bowels.    Wound Care: 1. Keep clean and dry.  Shower daily.   Reasons to call the Doctor: Fever - Oral temperature greater than 100.4 degrees Fahrenheit Foul-smelling vaginal discharge Difficulty urinating Nausea and vomiting Increased pain at the site of the incision that is unrelieved with pain medicine. Difficulty breathing with or without chest pain New calf pain especially if only on one side Sudden, continuing increased vaginal bleeding with or without clots.   Contacts: For questions or concerns you should contact:   Dr. Comer Dollar at [phone]   Eleanor Epps, NP at [phone]   After Hours: call [phone] and have the GYN Oncologist paged/contacted (after 5 pm or on the weekends).   Messages sent via mychart are for non-urgent matters and are not responded to after hours so for urgent needs, please call the after hours number.

## 2024-05-09 NOTE — Telephone Encounter (Signed)
 Pt has been scheduled tele preop appt 05/16/24. Med rec and consent are done.

## 2024-05-09 NOTE — Addendum Note (Signed)
 Addended by: Normalee Sistare R on: 05/09/2024 03:05 PM   Modules accepted: Orders

## 2024-05-09 NOTE — Telephone Encounter (Signed)
   Name: Zoe Cobb  DOB: April 07, 1956  MRN: [id number]  Primary Cardiologist: Gordy Bergamo, MD   Preoperative team, please contact this patient and set up a phone call appointment for further preoperative risk assessment. Please obtain consent and complete medication review. Thank you for your help.  I confirm that guidance regarding antiplatelet and oral anticoagulation therapy has been completed and, if necessary, noted below.  Due to history of GI bleeding, she is not on anticoagulation with hx of Atrial fib.  I also confirmed the patient resides in the state of [place] . As per [facility] Medical Board telemedicine laws, the patient must reside in the state in which the provider is licensed.   Lamarr Satterfield, NP 05/09/2024, 3:28 PM [facility]

## 2024-05-09 NOTE — Telephone Encounter (Signed)
 Per Dr Viktoria fax records and surgical optimization form to the patient's PCP office Ilah Shine, NP [phone]) and to the cardiology office (Dr Ladona [phone])

## 2024-05-09 NOTE — Telephone Encounter (Signed)
   Pre-operative Risk Assessment    Patient Name: Zoe Cobb  DOB: 12-21-1955 MRN: [id number]   Date of last office visit: 01/07/24 JON HAILS, PAC Date of next office visit: NONE   Request for Surgical Clearance    Procedure:  D&C OF THE UTERUS w/HYSTEROSCOPY AND MYOSURE, MIRENA  IUD PLACEMENT  Date of Surgery:  Clearance 05/29/24                                Surgeon:  DR. KATHERINE TUCKER Surgeon's Group or Practice Name:  [facility] Phone number:  [phone] Fax number:  [phone]   Type of Clearance Requested:   - Medical ; NONE INDICATED ON FORM TO BE HELD   Type of Anesthesia:  General    Additional requests/questions:    Zoe Cobb   05/09/2024, 3:18 PM

## 2024-05-09 NOTE — Telephone Encounter (Signed)
 Pt has been scheduled tele preop appt 05/16/24. Med rec and consent are done.      Patient Consent for Virtual Visit        Zoe Cobb has provided verbal consent on 05/09/2024 for a virtual visit (video or telephone).   CONSENT FOR VIRTUAL VISIT FOR:  Zoe Cobb  By participating in this virtual visit I agree to the following:  I hereby voluntarily request, consent and authorize [facility] and its employed or contracted physicians, physician assistants, nurse practitioners or other licensed health care professionals (the Practitioner), to provide me with telemedicine health care services (the "Services) as deemed necessary by the treating Practitioner. I acknowledge and consent to receive the Services by the Practitioner via telemedicine. I understand that the telemedicine visit will involve communicating with the Practitioner through live audiovisual communication technology and the disclosure of certain medical information by electronic transmission. I acknowledge that I have been given the opportunity to request an in-person assessment or other available alternative prior to the telemedicine visit and am voluntarily participating in the telemedicine visit.  I understand that I have the right to withhold or withdraw my consent to the use of telemedicine in the course of my care at any time, without affecting my right to future care or treatment, and that the Practitioner or I may terminate the telemedicine visit at any time. I understand that I have the right to inspect all information obtained and/or recorded in the course of the telemedicine visit and may receive copies of available information for a reasonable fee.  I understand that some of the potential risks of receiving the Services via telemedicine include:  Delay or interruption in medical evaluation due to technological equipment failure or disruption; Information transmitted may not be sufficient (e.g. poor  resolution of images) to allow for appropriate medical decision making by the Practitioner; and/or  In rare instances, security protocols could fail, causing a breach of personal health information.  Furthermore, I acknowledge that it is my responsibility to provide information about my medical history, conditions and care that is complete and accurate to the best of my ability. I acknowledge that Practitioner's advice, recommendations, and/or decision may be based on factors not within their control, such as incomplete or inaccurate data provided by me or distortions of diagnostic images or specimens that may result from electronic transmissions. I understand that the practice of medicine is not an exact science and that Practitioner makes no warranties or guarantees regarding treatment outcomes. I acknowledge that a copy of this consent can be made available to me via my patient portal [facility] MyChart), or I can request a printed copy by calling the office of [facility].    I understand that my insurance will be billed for this visit.   I have read or had this consent read to me. I understand the contents of this consent, which adequately explains the benefits and risks of the Services being provided via telemedicine.  I have been provided ample opportunity to ask questions regarding this consent and the Services and have had my questions answered to my satisfaction. I give my informed consent for the services to be provided through the use of telemedicine in my medical care

## 2024-05-09 NOTE — Progress Notes (Signed)
 Gynecologic Oncology Return Clinic Visit  05/09/24  Reason for Visit: follow-up   Treatment History: The patient was admitted in 10/2021 for anemia in the setting of vaginal bleeding with plan for outpatient follow-up within a week, which appears did not happen.  Patient was started on 40 mg of Megace  twice daily for a total of 7 days.   In January, the patient was seen as a new patient for menopausal bleeding for more than a year.  This was described as intermittent without associated pain or cramping.  Plan at that time was to proceed with EUA and D&C with ultrasound to assess endometrial lining.  Also discussed suppression with progesterone containing IUD.   The patient has had multiple admissions since for vaginal and/or rectal bleeding.  She was admitted in February for bleeding in the setting of an INR elevated at 3.5.  The patient was admitted in April with vaginal and rectal bleeding found to have a supratherapeutic INR in the setting of being on Coumadin  for A-fib.  She received vitamin K .  She was treated for diverticulitis at that time.  She was briefly admitted again in July after she presented with recurrent vaginal bleeding with an episode lasting for 3 days.  Her hemoglobin was noted to be 7.7.  This is in the setting of being on Coumadin  for A-fib with an INR of 3.6.  Her Coumadin  was held while he received vitamin K  until INR decreased.  She was also given 2 units of packed red blood cells.   Pelvic ultrasound exam was performed on 03/01/2023 and this revealed a uterus measuring 5.7 x 7 x 13.4 cm with a 4 x 5 x 5.2 lesion measured in the uterus corresponding to a fundal leiomyoma better seen on prior CT scan.  Endometrium with markedly limited evaluation, measures up to 12 mm.  Neither ovary seen.   The patient was taken for hysteroscopy with endometrial sampling on 03/12/2023.  Findings at surgery included normal-appearing cervix with abundant friable tissue within a dilated cervical os.   Hysteroscopy was abandoned secondary to abundant tissue limiting view.  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   The patient held her Coumadin  prior to her recent procedure, restarted it on 8/29.  She endorses beginning to have vaginal bleeding and pelvic pain on 8/30 with passage of large blood clots.  This is large-volume vaginal bleeding.  On presentation to the emergency department, mildly hypotensive (blood pressures normally run low), heart rate in the 90s.  Hemoglobin 8.3 with an INR of 2.3.  Patient was admitted to the stepdown unit and transfused 1 unit of packed red blood cells.  She was also given Kcentra  to reverse her INR.  The setting of hypotension, CT scan was obtained to rule out source of infection.  This was notable for a blood clot in the cervix versus upper vagina.  She was started on Rocephin  and doxycycline  for possible endometritis.   EMB on 09/20/23: FIGO grade 1 endometrioid adenocarcinoma.   10/18/23: Second Mirena  IUD placed in clinic. Pelvic ultrasound obtained that day in clinic showing a uterus measuring 10.3 x 4.9 x 4.9 cm.  Endometrial lining measures 17 mm.  IUD evaluation is very limited but arms of the IUD appear in the lower uterus, possibly penetrating into the myometrium.   01/31/24: Endometrial biopsy shows endometrioid carcinoma  with foci of squamous metaplasia, FIGO grade 1.  Interval History: Doing well.  Has stopped having any vaginal bleeding since sometime after starting letrozole .  Denies any abdominal pain or cramping.  Past Medical/Surgical History: Past Medical History:  Diagnosis Date   Arthritis    CHF (congestive heart failure) (HCC)    Diabetes mellitus without complication (HCC)    metformin   Dysrhythmia    A-fib   Hypertension    Morbid obesity  (HCC)    Multiple sclerosis    asymptomatic   Paroxysmal atrial fibrillation [facility])     Past Surgical History:  Procedure Laterality Date   COLONOSCOPY WITH PROPOFOL  N/A 12/15/2020   Procedure: COLONOSCOPY WITH PROPOFOL ;  Surgeon: Aneita Gwendlyn DASEN, MD;  Location: [facility] ENDOSCOPY;  Service: Endoscopy;  Laterality: N/A;   DILATATION & CURETTAGE/HYSTEROSCOPY WITH MYOSURE N/A 04/04/2023   Procedure: DILATATION & CURETTAGE/HYSTEROSCOPY WITH MYOSURE;  Surgeon: Viktoria Comer SAUNDERS, MD;  Location: [facility] ORS;  Service: Gynecology;  Laterality: N/A;   HYSTEROSCOPY WITH D & C N/A 03/12/2023   Procedure: DIAGNOSTIC HYSTEROSCOPY, ENDOMETRIAL BIOPSY;  Surgeon: Jeralyn Crutch, MD;  Location: [facility] ORS;  Service: Gynecology;  Laterality: N/A;   INTRAUTERINE DEVICE (IUD) INSERTION N/A 04/04/2023   Procedure: INTRAUTERINE DEVICE (IUD) INSERTION MIRENA , TILT TEST;  Surgeon: Viktoria Comer SAUNDERS, MD;  Location: [facility]S;  Service: Gynecology;  Laterality: N/A;   IR ANGIOGRAM SELECTIVE EACH ADDITIONAL VESSEL  12/13/2023   IR ANGIOGRAM SELECTIVE EACH ADDITIONAL VESSEL  12/13/2023   IR ANGIOGRAM SELECTIVE EACH ADDITIONAL VESSEL  12/13/2023   IR ANGIOGRAM SELECTIVE EACH ADDITIONAL VESSEL  12/13/2023   IR ANGIOGRAM VISCERAL SELECTIVE  12/13/2023   IR RADIOLOGIST EVAL & MGMT  12/14/2023   IR US  GUIDE VASC ACCESS RIGHT  12/13/2023    Family History  Problem Relation Age of Onset   Hypertension Mother    Diabetes Mother    Stomach cancer Father    Kidney disease Sister    Diabetes Sister    Diabetes Sister    Diabetes Sister    Heart attack Brother    HIV/AIDS Brother    Diabetes Brother    Prostate cancer Brother    Colon cancer Neg Hx    Breast cancer Neg Hx    Ovarian cancer Neg Hx    Endometrial cancer Neg Hx    Pancreatic cancer Neg Hx     Social History   Socioeconomic History   Marital status: Married    Spouse name: Not on file   Number of children: 2   Years of education: Not on file   Highest education level:  Not on file  Occupational History   Not on file  Tobacco Use   Smoking status: Former    Current packs/day: 0.00    Average packs/day: 1 pack/day for 9.0 years (9.0 ttl pk-yrs)    Types: Cigarettes    Start date: 24    Quit date: 1996    Years since quitting: 29.7   Smokeless tobacco: Never  Vaping Use   Vaping status: Never Used  Substance and Sexual Activity   Alcohol use: No    Alcohol/week: 0.0 standard drinks of alcohol   Drug use: No   Sexual activity: Not on file  Other Topics Concern   Not on file  Social History Narrative   Pt lives by herself, she completed hs.    Social Drivers of Corporate investment banker Strain: Not on file  Food Insecurity: No Food Insecurity (12/14/2023)  Hunger Vital Sign    Worried About Running Out of Food in the Last Year: Never true    Ran Out of Food in the Last Year: Never true  Transportation Needs: No Transportation Needs (12/14/2023)   PRAPARE - Administrator, Civil Service (Medical): No    Lack of Transportation (Non-Medical): No  Physical Activity: Not on file  Stress: Not on file  Social Connections: Socially Integrated (12/14/2023)   Social Connection and Isolation Panel    Frequency of Communication with Friends and Family: More than three times a week    Frequency of Social Gatherings with Friends and Family: Once a week    Attends Religious Services: 1 to 4 times per year    Active Member of [employer] or Organizations: No    Attends Engineer, structural: 1 to 4 times per year    Marital Status: Married    Current Medications:  Current Outpatient Medications:    acetaminophen  (TYLENOL ) 500 MG tablet, Take 500 mg by mouth every 6 (six) hours as needed., Disp: , Rfl:    isosorbide mononitrate (IMDUR) 60 MG 24 hr tablet, Take 60 mg by mouth daily., Disp: , Rfl:    letrozole  (FEMARA ) 2.5 MG tablet, Take 1 tablet (2.5 mg total) by mouth daily., Disp: 30 tablet, Rfl: 12   levonorgestrel  (MIRENA ) 20 MCG/DAY  IUD, Intrauterine device to be placed in office., Disp: 1 each, Rfl: 0   lidocaine  (LIDODERM ) 5 %, 2 patches daily., Disp: , Rfl:    metFORMIN (GLUCOPHAGE) 1000 MG tablet, Take 1,000 mg by mouth 2 (two) times daily., Disp: , Rfl:    methocarbamol (ROBAXIN) 500 MG tablet, Take 500 mg by mouth 2 (two) times daily as needed for muscle spasms., Disp: , Rfl:    metoprolol  succinate (TOPROL -XL) 100 MG 24 hr tablet, Take 1 tablet (100 mg total) by mouth daily. Take with or immediately following a meal., Disp: 90 tablet, Rfl: 3   torsemide  (DEMADEX ) 20 MG tablet, Take by mouth., Disp: , Rfl:    vitamin B-12 (CYANOCOBALAMIN ) 500 MCG tablet, Take 500 mcg by mouth daily., Disp: , Rfl:   Review of Systems: Denies appetite changes, fevers, chills, fatigue, unexplained weight changes. Denies hearing loss, neck lumps or masses, mouth sores, ringing in ears or voice changes. Denies cough or wheezing.  Denies shortness of breath. Denies chest pain or palpitations. Denies leg swelling. Denies abdominal distention, pain, blood in stools, constipation, diarrhea, nausea, vomiting, or early satiety. Denies pain with intercourse, dysuria, frequency, hematuria or incontinence. Denies hot flashes, pelvic pain, vaginal bleeding or vaginal discharge.   Denies joint pain, back pain or muscle pain/cramps. Denies itching, rash, or wounds. Denies dizziness, headaches, numbness or seizures. Denies swollen lymph nodes or glands, denies easy bruising or bleeding. Denies anxiety, depression, confusion, or decreased concentration.  Physical Exam: BP 104/71 (BP Location: Left Arm, Patient Position: Sitting)   Pulse (!) 108   Temp 97.7 F (36.5 C) (Oral)   Resp 20   SpO2 100%  General: Alert, oriented, no acute distress. HEENT: Posterior oropharynx clear, sclera anicteric. Chest: Unlabored breathing on room air. Abdomen: Obese, soft, nontender.  Normoactive bowel sounds.  No masses or hepatosplenomegaly appreciated.    Extremities: Grossly normal range of motion.  Warm, well perfused.  Trace edema bilaterally.  GU: Normal appearing external genitalia without erythema, excoriation, or lesions.  Speculum exam, distal vaginal mucosa visualized with one of the IUD strings seen.  Unable to reach the cervix.  The cervix and upper vagina was cleansed with Betadine x 6.  On bimanual exam subsequently, both IUDs were found in the upper vagina and removed.  Cervix itself was dilated approximately 1-2 cm and otherwise normal on palpation.    Endometrial biopsy procedure Preoperative diagnosis: low grade endometrial cancer Postoperative diagnosis: Same as above Physician: Viktoria MD Estimated blood loss: Minimal Specimens: Endometrial biopsy Procedure: After the procedure was discussed with the patient, she gave verbal consent.  Using the [facility] lift, she was then moved from her wheelchair to the exam table.  She was left in the [place] lift for the procedure.  Legs were elevated.  Washcloths were used to clean the external genitalia.  Betadine was used x 6 to swab the upper vagina/cervix blindly.  An endometrial Pipelle was then passed until resistance met over my finger.  1 pass was performed with minimal tissue obtained.  This was placed in formalin.  Overall the patient tolerated the procedure well.   Laboratory & Radiologic Studies: None new  Assessment & Plan: Zoe Cobb is a 68 y.o. 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   IUD placed at the time of D&C on 04/04/23. MMRp, p53 WT. Most recent biopsy in 10/2023 showed persistent FIGO grade 1 well-differentiated endometrioid adenocarcinoma.  Second IUD placed on 10/18/2023. Letrozole  started in 02/2024.   Patient is doing well.  Bleeding has stopped since letrozole  started.  Unfortunately, both progesterone IUDs were located at the top of the vagina on exam today.  Discussed with patient recommendation that we proceed with exam  under anesthesia, hysteroscopy and additional endometrial sampling and replacement of an IUD.  Unfortunately, especially since she is off her anticoagulation now, I favor local progesterone rather than high-dose oral progesterone given side effect profile.  Patient amenable.  We discussed plan for exam under anesthesia, hysteroscopy, dilation curettage, Mirena  IUD placement, possible ultrasound guidance.  Discussed risks which include but are not limited to bleeding, need for blood transfusion, infection, damage to surrounding structures, VTE, cardiac events, and very rarely death.  Perioperative instructions will be reviewed with the patient.  The office will reach out for cardiac and PCP clearance.  28 minutes of total time was spent for this patient encounter, including preparation, face-to-face counseling with the patient and coordination of care, and documentation of the encounter.  Comer Viktoria, MD  Division of Gynecologic Oncology  Department of Obstetrics and Gynecology  [facility]

## 2024-05-13 ENCOUNTER — Encounter: Payer: Self-pay | Admitting: Gynecologic Oncology

## 2024-05-13 ENCOUNTER — Ambulatory Visit: Payer: Self-pay | Admitting: Gynecologic Oncology

## 2024-05-13 LAB — SURGICAL PATHOLOGY

## 2024-05-13 NOTE — Progress Notes (Signed)
 Called patient, discussed recent biopsy results. Plan to move forward with procedure as scheduled.

## 2024-05-13 NOTE — H&P (View-Only) (Signed)
 Called patient, discussed recent biopsy results. Plan to move forward with procedure as scheduled.

## 2024-05-15 ENCOUNTER — Telehealth: Payer: Self-pay | Admitting: *Deleted

## 2024-05-15 NOTE — Progress Notes (Unsigned)
 Virtual Visit via Telephone Note   Because of Zoe Cobb co-morbid illnesses, she is at least at moderate risk for complications without adequate follow up.  This format is felt to Cobb most appropriate for this patient at this time.  Due to technical limitations with video connection (technology), today's appointment will Cobb conducted as an audio only telehealth visit, and Zoe Cobb verbally agreed to proceed in this manner.   All issues noted in this document were discussed and addressed.  No physical exam could Cobb performed with this format.  Evaluation Performed:  Preoperative cardiovascular risk assessment _____________   Date:  05/15/2024   Patient ID:  Zoe Cobb, DOB 01-15-1956, MRN [id number] Patient Location:  Home Provider location:   Office  Primary Care Provider:  Salina Clarity, Cobb Primary Cardiologist:  Zoe Bergamo, Cobb  Chief Complaint / Patient Profile   68 y.o. y/o female with a h/o chronic diastolic CHF, paroxysmal atrial fibrillation, essential hypertension who is pending Cobb&C OF THE UTERUS w/HYSTEROSCOPY AND MYOSURE, MIRENA  IUD PLACEMENT  and presents today for telephonic preoperative cardiovascular risk assessment.  History of Present Illness    Zoe Cobb is a 68 y.o. female who presents via audio/video conferencing for a telehealth visit today.  Pt was last seen in cardiology clinic on 01/07/2024 by Zoe Hails Cobb-C.  At that time Zoe Cobb was doing well .  The patient is now pending procedure as outlined above. Since her last visit, she remains stable from a cardiac standpoint.  Today she denies chest pain, shortness of breath, lower extremity edema, fatigue, palpitations, melena, hematuria, hemoptysis, diaphoresis, weakness, presyncope, syncope, orthopnea, and PND.   Past Medical History    Past Medical History:  Diagnosis Date   Arthritis    CHF (congestive heart failure) (HCC)    Diabetes mellitus without complication (HCC)     metformin   Dysrhythmia    A-fib   Hypertension    Morbid obesity (HCC)    Multiple sclerosis    asymptomatic   Paroxysmal atrial fibrillation Zoe Cobb)    Past Surgical History:  Procedure Laterality Date   COLONOSCOPY WITH PROPOFOL  N/A 12/15/2020   Procedure: COLONOSCOPY WITH PROPOFOL ;  Surgeon: Zoe Gwendlyn DASEN, Cobb;  Location: [facility] ENDOSCOPY;  Service: Endoscopy;  Laterality: N/A;   DILATATION & CURETTAGE/HYSTEROSCOPY WITH MYOSURE N/A 04/04/2023   Procedure: DILATATION & CURETTAGE/HYSTEROSCOPY WITH MYOSURE;  Surgeon: Zoe Comer SAUNDERS, Cobb;  Location: [facility];  Service: Gynecology;  Laterality: N/A;   HYSTEROSCOPY WITH Cobb & C N/A 03/12/2023   Procedure: DIAGNOSTIC HYSTEROSCOPY, ENDOMETRIAL BIOPSY;  Surgeon: Zoe Crutch, Cobb;  Location: [facility];  Service: Gynecology;  Laterality: N/A;   INTRAUTERINE DEVICE (IUD) INSERTION N/A 04/04/2023   Procedure: INTRAUTERINE DEVICE (IUD) INSERTION MIRENA , TILT TEST;  Surgeon: Zoe Comer SAUNDERS, Cobb;  Location: [facility];  Service: Gynecology;  Laterality: N/A;   IR ANGIOGRAM SELECTIVE EACH ADDITIONAL VESSEL  12/13/2023   IR ANGIOGRAM SELECTIVE EACH ADDITIONAL VESSEL  12/13/2023   IR ANGIOGRAM SELECTIVE EACH ADDITIONAL VESSEL  12/13/2023   IR ANGIOGRAM SELECTIVE EACH ADDITIONAL VESSEL  12/13/2023   IR ANGIOGRAM VISCERAL SELECTIVE  12/13/2023   IR RADIOLOGIST EVAL & MGMT  12/14/2023   IR US  GUIDE VASC ACCESS RIGHT  12/13/2023    Allergies  Allergies  Allergen Reactions   Penicillins Swelling    Home Medications    Prior to Admission medications   Medication Sig Start Date End Date Taking? Authorizing Provider  acetaminophen  (TYLENOL ) 500 MG tablet Take 500 mg  by mouth every 6 (six) hours as needed.    Provider, Historical, Cobb  isosorbide mononitrate (IMDUR) 60 MG 24 hr tablet Take 60 mg by mouth daily. 02/28/24   Provider, Historical, Cobb  letrozole  (FEMARA ) 2.5 MG tablet Take 1 tablet (2.5 mg total) by mouth daily. 01/31/24   Zoe Comer SAUNDERS, Cobb   levonorgestrel  (MIRENA ) 20 MCG/DAY IUD Intrauterine device to Cobb placed in office. 10/03/23 05/09/24  Zoe Cobb  lidocaine  (LIDODERM ) 5 % 2 patches daily. 04/21/24   Provider, Historical, Cobb  metFORMIN (GLUCOPHAGE) 1000 MG tablet Take 1,000 mg by mouth 2 (two) times daily. 10/29/23   Provider, Historical, Cobb  methocarbamol (ROBAXIN) 500 MG tablet Take 500 mg by mouth 2 (two) times daily as needed for muscle spasms. 09/12/23   Provider, Historical, Cobb  metoprolol  succinate (TOPROL -XL) 100 MG 24 hr tablet Take 1 tablet (100 mg total) by mouth daily. Take with or immediately following a meal. 01/07/24 05/09/24  Zoe Cobb  torsemide  (DEMADEX ) 20 MG tablet Take by mouth. 01/18/24   Provider, Historical, Cobb  vitamin B-12 (CYANOCOBALAMIN ) 500 MCG tablet Take 500 mcg by mouth daily.    Provider, Historical, Cobb    Physical Exam    Vital Signs:  Zoe Cobb does not have vital signs available for review today.  Given telephonic nature of communication, physical exam is limited. AAOx3. NAD. Normal affect.  Speech and respirations are unlabored.  Accessory Clinical Findings    None  Assessment & Plan    1.  Preoperative Cardiovascular Risk Assessment: Cobb&C OF THE UTERUS w/HYSTEROSCOPY AND MYOSURE, MIRENA  IUD PLACEMENT   Date of Surgery:  Clearance 05/29/24                                  Surgeon:  Zoe Cobb Surgeon's Group or Practice Name:  [facility] Cobb Phone number:  [phone] Fax number:  [phone]      Primary Cardiologist: Zoe Bergamo, Cobb  Chart reviewed as part of pre-operative protocol coverage. Given past medical history and time since last visit, based on ACC/AHA guidelines, Zoe Cobb at acceptable risk for the planned procedure without further cardiovascular testing.   Her RCRI is moderate risk, 6.6% risk of major cardiac event.  She is able to complete greater than 4 METS of physical activity.  Patient was advised that  if she develops new symptoms prior to surgery to contact our office to arrange a follow-up appointment.  She verbalized understanding.  I will route this recommendation to the requesting party via Epic fax function and remove from pre-op pool.      Time:   Today, I have spent 5 minutes with the patient with telehealth technology discussing medical history, symptoms, and management plan. I spent 10 minutes reviewing patient's past cardiac history and cardiac medications.     Josefa CHRISTELLA Beauvais, Cobb  05/15/2024, 4:12 PM

## 2024-05-15 NOTE — Telephone Encounter (Signed)
 Received PCP clearance.

## 2024-05-15 NOTE — Telephone Encounter (Signed)
 Spoke with patient's primary care provider Mliss Shine, NP who states patient is cleared for surgery with Dr. Viktoria. Patient's diabetes is well managed as well as her A-Fib. If patient goes into A-Fib her blood pressure will drop into the 90's

## 2024-05-16 ENCOUNTER — Ambulatory Visit

## 2024-05-16 DIAGNOSIS — Z01810 Encounter for preprocedural cardiovascular examination: Secondary | ICD-10-CM | POA: Diagnosis not present

## 2024-05-16 NOTE — Patient Instructions (Addendum)
 SURGICAL WAITING ROOM VISITATION  Patients having surgery or a procedure may have no more than 2 support people in the waiting area - these visitors may rotate.    Children under the age of 72 must have an adult with them who is not the patient.  Visitors with respiratory illnesses are discouraged from visiting and should remain at home.  If the patient needs to stay at the hospital during part of their recovery, the visitor guidelines for inpatient rooms apply. Pre-op nurse will coordinate an appropriate time for 1 support person to accompany patient in pre-op.  This support person may not rotate.    Please refer to the [facility] website for the visitor guidelines for Inpatients (after your surgery is over and you are in a regular room).       Your procedure is scheduled on: 05/29/24   Report to [facility] Main Entrance    Report to admitting at 8:45 AM   Call this number if you have problems the morning of surgery [phone]   Do not eat food :After Midnight.   After Midnight you may have the following liquids until 8 AM DAY OF SURGERY  Water Non-Citrus Juices (without pulp, NO RED-Apple, White grape, White cranberry) Black Coffee (NO MILK/CREAM OR CREAMERS, sugar ok)  Clear Tea (NO MILK/CREAM OR CREAMERS, sugar ok) regular and decaf                             Plain Jell-O (NO RED)                                           Fruit ices (not with fruit pulp, NO RED)                                     Popsicles (NO RED)                                                               Sports drinks like Gatorade (NO RED)                Oral Hygiene is also important to reduce your risk of infection.                                    Remember - BRUSH YOUR TEETH THE MORNING OF SURGERY WITH YOUR REGULAR TOOTHPASTE    Stop all vitamins and herbal supplements 7 days before surgery.   Take these medicines the morning of surgery with A SIP OF WATER: tylenol ,  imdur,letrozole (femara ), metoprolol , torsemide   DO NOT TAKE ANY ORAL DIABETIC MEDICATIONS DAY OF YOUR SURGERY Hold Metformin the morning of surgery.              You may not have any metal on your body including hair pins, jewelry, and body piercing             Do not wear make-up, lotions, powders, perfumes/cologne, or deodorant  Do not  wear nail polish including gel and S&S, artificial/acrylic nails, or any other type of covering on natural nails including finger and toenails. If you have artificial nails, gel coating, etc. that needs to be removed by a nail salon please have this removed prior to surgery or surgery may need to be canceled/ delayed if the surgeon/ anesthesia feels like they are unable to be safely monitored.   Do not shave  48 hours prior to surgery.              Do not bring valuables to the hospital. [facility] IS NOT             RESPONSIBLE   FOR VALUABLES.   Contacts, glasses, dentures or bridgework may not be worn into surgery.    DO NOT BRING YOUR HOME MEDICATIONS TO THE HOSPITAL. PHARMACY WILL DISPENSE MEDICATIONS LISTED ON YOUR MEDICATION LIST TO YOU DURING YOUR ADMISSION IN THE HOSPITAL!    Patients discharged on the day of surgery will not be allowed to drive home.  Someone NEEDS to stay with you for the first 24 hours after anesthesia.   Special Instructions: Bring a copy of your healthcare power of attorney and living will documents the day of surgery if you haven't scanned them before.              Please read over the following fact sheets you were given: IF YOU HAVE QUESTIONS ABOUT YOUR PRE-OP INSTRUCTIONS PLEASE CALL [phone] [place]   If you received a COVID test during your pre-op visit  it is requested that you wear a mask when out in public, stay away from anyone that may not be feeling well and notify your surgeon if you develop symptoms. If you test positive for Covid or have been in contact with anyone that has tested positive in the last 10  days please notify you surgeon.    [facility] - Preparing for Surgery Before surgery, you can play an important role.  Because skin is not sterile, your skin needs to be as free of germs as possible.  You can reduce the number of germs on your skin by washing with CHG (chlorahexidine gluconate) soap before surgery.  CHG is an antiseptic cleaner which kills germs and bonds with the skin to continue killing germs even after washing. Please DO NOT use if you have an allergy to CHG or antibacterial soaps.  If your skin becomes reddened/irritated stop using the CHG and inform your nurse when you arrive at Short Stay. Do not shave (including legs and underarms) for at least 48 hours prior to the first CHG shower.  You may shave your face/neck.  Please follow these instructions carefully:  1.  Shower with CHG Soap the night before surgery and the morning of surgery.  2.  If you choose to wash your hair, wash your hair first as usual with your normal  shampoo.  3.  After you shampoo, rinse your hair and body thoroughly to remove the shampoo.                             4.  Use CHG as you would any other liquid soap.  You can apply chg directly to the skin and wash.  Gently with a scrungie or clean washcloth.  5.  Apply the CHG Soap to your body ONLY FROM THE NECK DOWN.   Do   not use on face/ open  Wound or open sores. Avoid contact with eyes, ears mouth and   genitals (private parts).                       Wash face,  Genitals (private parts) with your normal soap.             6.  Wash thoroughly, paying special attention to the area where your    surgery  will be performed.  7.  Thoroughly rinse your body with warm water from the neck down.  8.  DO NOT shower/wash with your normal soap after using and rinsing off the CHG Soap.                9.  Pat yourself dry with a clean towel.            10.  Wear clean pajamas.            11.  Place clean sheets on your bed the night of  your first shower and do not  sleep with pets. Day of Surgery : Do not apply any CHG, lotions/deodorants the morning of surgery.  Please wear clean clothes to the hospital/surgery center.  FAILURE TO FOLLOW THESE INSTRUCTIONS MAY RESULT IN THE CANCELLATION OF YOUR SURGERY  PATIENT SIGNATURE_________________________________  NURSE SIGNATURE__________________________________  ________________________________________________________________________How to Manage Your Diabetes Before and After Surgery  Why is it important to control my blood sugar before and after surgery? Improving blood sugar levels before and after surgery helps healing and can limit problems. A way of improving blood sugar control is eating a healthy diet by:  Eating less sugar and carbohydrates  Increasing activity/exercise  Talking with your doctor about reaching your blood sugar goals High blood sugars (greater than 180 mg/dL) can raise your risk of infections and slow your recovery, so you will need to focus on controlling your diabetes during the weeks before surgery. Make sure that the doctor who takes care of your diabetes knows about your planned surgery including the date and location.  How do I manage my blood sugar before surgery? Check your blood sugar at least 4 times a day, starting 2 days before surgery, to make sure that the level is not too high or low. Check your blood sugar the morning of your surgery when you wake up and every 2 hours until you get to the Short Stay unit. If your blood sugar is less than 70 mg/dL, you will need to treat for low blood sugar: Do not take insulin . Treat a low blood sugar (less than 70 mg/dL) with  cup of clear juice (cranberry or apple), 4 glucose tablets, OR glucose gel. Recheck blood sugar in 15 minutes after treatment (to make sure it is greater than 70 mg/dL). If your blood sugar is not greater than 70 mg/dL on recheck, call [phone] for further  instructions. Report your blood sugar to the short stay nurse when you get to Short Stay.  If you are admitted to the hospital after surgery: Your blood sugar will be checked by the staff and you will probably be given insulin  after surgery (instead of oral diabetes medicines) to make sure you have good blood sugar levels. The goal for blood sugar control after surgery is 80-180 mg/dL.   WHAT DO I DO ABOUT MY DIABETES MEDICATION?  Do not take oral diabetes medicines (pills) the morning of surgery.  Patient Signature:  Date:   Nurse Signature:  Date:   Reviewed and  Endorsed by [facility] Patient Education Committee, August 2015

## 2024-05-16 NOTE — Progress Notes (Addendum)
 COVID Vaccine received:  []  No [x]  Yes Date of any COVID positive Test in last 90 days: no PCP - Mliss Shine NP Cardiologist - Gordy Bergamo MD  Chest x-ray - 12/13/23 Epic EKG - 12/17/23 Epic  Stress Test -  ECHO - 08/29/16 Epic Cardiac Cath -   Cardiac clearance- Josefa Beauvais 05/15/24  Bowel Prep - [x]  No  []   Yes ______  Pacemaker / ICD device [x]  No []  Yes   Spinal Cord Stimulator:[x]  No []  Yes       History of Sleep Apnea? [x]  No []  Yes   CPAP used?- [x]  No []  Yes    Does the patient monitor blood sugar?          []  No [x]  Yes  []  N/A  Patient has: []  NO Hx DM   []  Pre-DM                 []  DM1  [x]   DM2 Does patient have a [employer] or Dexacom? [x]  No []  Yes   Fasting Blood Sugar Ranges- 102-103 Checks Blood Sugar ___1__ times a day  GLP1 agonist / usual dose - no GLP1 instructions:  SGLT-2 inhibitors / usual dose - no SGLT-2 instructions:   Blood Thinner / Instructions:no Aspirin  Instructions:no  Comments:   Activity level: Patient is unable  to climb a flight of stairs without difficulty; [x]  No CP  [x]  No SOB, w/c bound_   Patient  can not perform ADLs without assistance.   Anesthesia review: CHF, A-fib, HTN, DM, GI bleed with Coumadin , W/C bound  Patient denies shortness of breath, fever, cough and chest pain at PAT appointment.  Patient verbalized understanding and agreement to the Pre-Surgical Instructions that were given to them at this PAT appointment. Patient was also educated of the need to review these PAT instructions again prior to his/her surgery.I reviewed the appropriate phone numbers to call if they have any and questions or concerns.

## 2024-05-19 ENCOUNTER — Other Ambulatory Visit: Payer: Self-pay

## 2024-05-19 ENCOUNTER — Encounter (HOSPITAL_COMMUNITY): Payer: Self-pay

## 2024-05-19 ENCOUNTER — Encounter (HOSPITAL_COMMUNITY)
Admission: RE | Admit: 2024-05-19 | Discharge: 2024-05-19 | Disposition: A | Discharge status: Home / Self Care | Source: Ambulatory Visit | Attending: Gynecologic Oncology | Admitting: Gynecologic Oncology

## 2024-05-19 VITALS — BP 118/87 | HR 117 | Temp 97.6°F | Resp 22 | Ht 62.0 in

## 2024-05-19 DIAGNOSIS — I480 Paroxysmal atrial fibrillation: Secondary | ICD-10-CM | POA: Insufficient documentation

## 2024-05-19 DIAGNOSIS — I10 Essential (primary) hypertension: Secondary | ICD-10-CM

## 2024-05-19 DIAGNOSIS — I110 Hypertensive heart disease with heart failure: Secondary | ICD-10-CM | POA: Insufficient documentation

## 2024-05-19 DIAGNOSIS — Z01812 Encounter for preprocedural laboratory examination: Secondary | ICD-10-CM | POA: Insufficient documentation

## 2024-05-19 DIAGNOSIS — Z01818 Encounter for other preprocedural examination: Secondary | ICD-10-CM

## 2024-05-19 DIAGNOSIS — E119 Type 2 diabetes mellitus without complications: Secondary | ICD-10-CM | POA: Diagnosis not present

## 2024-05-19 DIAGNOSIS — Z87891 Personal history of nicotine dependence: Secondary | ICD-10-CM | POA: Diagnosis not present

## 2024-05-19 DIAGNOSIS — C541 Malignant neoplasm of endometrium: Secondary | ICD-10-CM | POA: Insufficient documentation

## 2024-05-19 DIAGNOSIS — Z7901 Long term (current) use of anticoagulants: Secondary | ICD-10-CM | POA: Diagnosis not present

## 2024-05-19 DIAGNOSIS — I5032 Chronic diastolic (congestive) heart failure: Secondary | ICD-10-CM | POA: Diagnosis not present

## 2024-05-19 HISTORY — DX: Malignant (primary) neoplasm, unspecified: C80.1

## 2024-05-19 LAB — HEMOGLOBIN A1C
Hgb A1c MFr Bld: 6.4 % — ABNORMAL HIGH (ref 4.8–5.6)
Mean Plasma Glucose: 136.98 mg/dL

## 2024-05-19 LAB — CBC
HCT: 33.7 % — ABNORMAL LOW (ref 36.0–46.0)
Hemoglobin: 9.1 g/dL — ABNORMAL LOW (ref 12.0–15.0)
MCH: 20.2 pg — ABNORMAL LOW (ref 26.0–34.0)
MCHC: 27 g/dL — ABNORMAL LOW (ref 30.0–36.0)
MCV: 74.9 fL — ABNORMAL LOW (ref 80.0–100.0)
Platelets: 375 K/uL (ref 150–400)
RBC: 4.5 MIL/uL (ref 3.87–5.11)
RDW: 22.3 % — ABNORMAL HIGH (ref 11.5–15.5)
WBC: 6.7 K/uL (ref 4.0–10.5)
nRBC: 0 % (ref 0.0–0.2)

## 2024-05-19 LAB — BASIC METABOLIC PANEL WITH GFR
Anion gap: 12 (ref 5–15)
BUN: 29 mg/dL — ABNORMAL HIGH (ref 8–23)
CO2: 27 mmol/L (ref 22–32)
Calcium: 9.3 mg/dL (ref 8.9–10.3)
Chloride: 102 mmol/L (ref 98–111)
Creatinine, Ser: 0.92 mg/dL (ref 0.44–1.00)
GFR, Estimated: >60 mL/min (ref >60)
Glucose, Bld: 129 mg/dL — ABNORMAL HIGH (ref 70–99)
Potassium: 4.4 mmol/L (ref 3.5–5.1)
Sodium: 142 mmol/L (ref 135–145)

## 2024-05-19 NOTE — Telephone Encounter (Signed)
Received cardiology clearance

## 2024-05-19 NOTE — Progress Notes (Signed)
 Request sent to Dr. LOIS Dollar to review pt's pre op CBC.

## 2024-05-20 NOTE — Progress Notes (Signed)
 Anesthesia Chart Review   Case: [id number] Date/Time: 05/29/24 1045   Procedures:      DILATATION & CURETTAGE/HYSTEROSCOPY WITH RESECTOCOPE - MYOSURE     INSERTION, INTRAUTERINE DEVICE     US  INTRAOPERATIVE   Anesthesia type: General   Pre-op diagnosis: ENDOMETRIAL CANCER   Location: [facility] ROOM 05 / [facility]   Surgeons: Viktoria Comer SAUNDERS, MD       DISCUSSION:68 y.o. former smoker with h/o HTN, PAF on Coumadin , chronic diastolic CHF, DM II, endometrial cancer scheduled for above procedure 05/29/24 with Dr. Comer Viktoria.   Per cardiology preoperative evaluation 05/16/24, Chart reviewed as part of pre-operative protocol coverage. Given past medical history and time since last visit, based on ACC/AHA guidelines, Zoe Cobb would be at acceptable risk for the planned procedure without further cardiovascular testing.   Her RCRI is moderate risk, 6.6% risk of major cardiac event.  She is able to complete greater than 4 METS of physical activity.  Per PCP pt is cleared for surgery, DM well controlled, a-fib controlled.  VS: BP 118/87   Pulse (!) 117   Temp 36.4 C (Oral)   Resp (!) 22   Ht 5' 2 (1.575 m)   SpO2 96%   BMI 74.08 kg/m   PROVIDERS: Salina Clarity, NP is PCP   Cardiologist - Gordy Bergamo MD  LABS: Labs reviewed: Acceptable for surgery. (all labs ordered are listed, but only abnormal results are displayed)  Labs Reviewed  HEMOGLOBIN A1C - Abnormal; Notable for the following components:      Result Value   Hgb A1c MFr Bld 6.4 (*)    All other components within normal limits  BASIC METABOLIC PANEL WITH GFR - Abnormal; Notable for the following components:   Glucose, Bld 129 (*)    BUN 29 (*)    All other components within normal limits  CBC - Abnormal; Notable for the following components:   Hemoglobin 9.1 (*)    HCT 33.7 (*)    MCV 74.9 (*)    MCH 20.2 (*)    MCHC 27.0 (*)    RDW 22.3 (*)    All other components within normal limits      IMAGES:   EKG:   CV: Echo 08/29/2016 - Left ventricle: The cavity size was mildly dilated. Wall    thickness was increased in a pattern of mild LVH. Systolic    function was normal. The estimated ejection fraction was in the    range of 55% to 60%. Wall motion was normal; there were no    regional wall motion abnormalities. Features are consistent with    a pseudonormal left ventricular filling pattern, with concomitant    abnormal relaxation and increased filling pressure (grade 2    diastolic dysfunction).  - Mitral valve: Calcified annulus.  - Left atrium: The atrium was moderately dilated.  - Right atrium: The atrium was mildly dilated.  - Pulmonary arteries: Systolic pressure was mildly increased. PA    peak pressure: 42 mm Hg (S).  Past Medical History:  Diagnosis Date   Arthritis    Cancer (HCC)    CHF (congestive heart failure) (HCC)    Diabetes mellitus without complication (HCC)    metformin   Dysrhythmia    A-fib   Hypertension    Morbid obesity (HCC)    Multiple sclerosis    asymptomatic   Paroxysmal atrial fibrillation [facility])     Past Surgical History:  Procedure Laterality Date   COLONOSCOPY WITH PROPOFOL   N/A 12/15/2020   Procedure: COLONOSCOPY WITH PROPOFOL ;  Surgeon: Aneita Gwendlyn DASEN, MD;  Location: [facility] ENDOSCOPY;  Service: Endoscopy;  Laterality: N/A;   DILATATION & CURETTAGE/HYSTEROSCOPY WITH MYOSURE N/A 04/04/2023   Procedure: DILATATION & CURETTAGE/HYSTEROSCOPY WITH MYOSURE;  Surgeon: Viktoria Comer SAUNDERS, MD;  Location: [facility];  Service: Gynecology;  Laterality: N/A;   HYSTEROSCOPY WITH D & C N/A 03/12/2023   Procedure: DIAGNOSTIC HYSTEROSCOPY, ENDOMETRIAL BIOPSY;  Surgeon: Jeralyn Crutch, MD;  Location: [facility];  Service: Gynecology;  Laterality: N/A;   INTRAUTERINE DEVICE (IUD) INSERTION N/A 04/04/2023   Procedure: INTRAUTERINE DEVICE (IUD) INSERTION MIRENA , TILT TEST;  Surgeon: Viktoria Comer SAUNDERS, MD;  Location: [facility];  Service: Gynecology;   Laterality: N/A;   IR ANGIOGRAM SELECTIVE EACH ADDITIONAL VESSEL  12/13/2023   IR ANGIOGRAM SELECTIVE EACH ADDITIONAL VESSEL  12/13/2023   IR ANGIOGRAM SELECTIVE EACH ADDITIONAL VESSEL  12/13/2023   IR ANGIOGRAM SELECTIVE EACH ADDITIONAL VESSEL  12/13/2023   IR ANGIOGRAM VISCERAL SELECTIVE  12/13/2023   IR RADIOLOGIST EVAL & MGMT  12/14/2023   IR US  GUIDE VASC ACCESS RIGHT  12/13/2023    MEDICATIONS:  acetaminophen  (TYLENOL ) 500 MG tablet   isosorbide mononitrate (IMDUR) 60 MG 24 hr tablet   letrozole  (FEMARA ) 2.5 MG tablet   levonorgestrel  (MIRENA ) 20 MCG/DAY IUD   lidocaine  (LIDODERM ) 5 %   metFORMIN (GLUCOPHAGE) 1000 MG tablet   metoprolol  succinate (TOPROL -XL) 100 MG 24 hr tablet   metoprolol  succinate (TOPROL -XL) 50 MG 24 hr tablet   torsemide  (DEMADEX ) 20 MG tablet   vitamin B-12 (CYANOCOBALAMIN ) 500 MCG tablet   No current facility-administered medications for this encounter.    Harlene Hoots Ward, PA-C [facility]-Surgical Testing [phone]

## 2024-05-20 NOTE — Anesthesia Preprocedure Evaluation (Addendum)
 Anesthesia Evaluation  Patient identified by MRN, date of birth, ID band Patient awake    Reviewed: Allergy & Precautions, NPO status , Patient's Chart, lab work & pertinent test results  Airway Mallampati: II  TM Distance: >3 FB Neck ROM: Full    Dental no notable dental hx. (+) Missing, Caps, Dental Advisory Given   Pulmonary former smoker   Pulmonary exam normal breath sounds clear to auscultation       Cardiovascular hypertension, +CHF  Normal cardiovascular exam+ dysrhythmias Atrial Fibrillation  Rhythm:Regular Rate:Normal     Neuro/Psych MS assymptomaic  negative psych ROS   GI/Hepatic   Endo/Other  diabetes, Type 2  Class 4 obesity  Renal/GU Renal diseaseLab Results      Component                Value               Date                      NA                       142                 05/19/2024                CL                       102                 05/19/2024                K                        4.4                 05/19/2024                CO2                      27                  05/19/2024                BUN                      29 (H)              05/19/2024                CREATININE               0.92                05/19/2024                GFRNONAA                 >60                 05/19/2024                CALCIUM                  9.3                 05/19/2024  PHOS                     3.3                 12/14/2023                ALBUMIN                  2.4 (L)             12/14/2023                GLUCOSE                  129 (H)             05/19/2024              Endometrial ca    Musculoskeletal  (+) Arthritis ,    Abdominal   Peds  Hematology  (+) Blood dyscrasia, anemia Lab Results      Component                Value               Date                      WBC                      6.7                 05/19/2024                HGB                      9.1 (L)              05/19/2024                HCT                      33.7 (L)            05/19/2024                MCV                      74.9 (L)            05/19/2024                PLT                      375                 05/19/2024              Anesthesia Other Findings   Reproductive/Obstetrics                              Anesthesia Physical Anesthesia Plan  ASA: 4  Anesthesia Plan: General   Post-op Pain Management: Ofirmev  IV (intra-op)*   Induction: Intravenous  PONV Risk Score and Plan: 3 and Treatment may vary due to age or medical condition, Ondansetron , Dexamethasone  and Midazolam   Airway Management Planned: Oral ETT and Video Laryngoscope Planned  Additional Equipment: None  Intra-op Plan:   Post-operative Plan: Extubation in OR  Informed  Consent: I have reviewed the patients History and Physical, chart, labs and discussed the procedure including the risks, benefits and alternatives for the proposed anesthesia with the patient or authorized representative who has indicated his/her understanding and acceptance.     Dental advisory given  Plan Discussed with: CRNA and Surgeon  Anesthesia Plan Comments: (See PAT note 05/19/2024)         Anesthesia Quick Evaluation

## 2024-05-21 ENCOUNTER — Other Ambulatory Visit

## 2024-05-28 ENCOUNTER — Telehealth: Payer: Self-pay | Admitting: *Deleted

## 2024-05-28 NOTE — Telephone Encounter (Signed)
 Telephone call to check on pre-operative status.  Patient compliant with pre-operative instructions.  Reinforced nothing to eat after midnight. Clear liquids until 0730. Patient to arrive at 0830.  No questions or concerns voiced.  Instructed to call for any needs.  ?

## 2024-05-29 ENCOUNTER — Ambulatory Visit (HOSPITAL_COMMUNITY)
Admission: RE | Admit: 2024-05-29 | Discharge: 2024-05-29 | Disposition: A | Discharge status: Home / Self Care | Source: Ambulatory Visit | Attending: Gynecologic Oncology | Admitting: Gynecologic Oncology

## 2024-05-29 ENCOUNTER — Encounter (HOSPITAL_COMMUNITY): Payer: Self-pay | Admitting: Gynecologic Oncology

## 2024-05-29 ENCOUNTER — Ambulatory Visit (HOSPITAL_COMMUNITY): Admitting: Anesthesiology

## 2024-05-29 ENCOUNTER — Ambulatory Visit (HOSPITAL_COMMUNITY): Admission: RE | Admit: 2024-05-29 | Discharge: 2024-05-29 | Attending: Gynecologic Oncology

## 2024-05-29 ENCOUNTER — Encounter (HOSPITAL_COMMUNITY)
Admission: RE | Disposition: A | Discharge status: Home / Self Care | Payer: Self-pay | Source: Ambulatory Visit | Attending: Gynecologic Oncology

## 2024-05-29 ENCOUNTER — Ambulatory Visit (HOSPITAL_COMMUNITY): Payer: Self-pay | Admitting: Physician Assistant

## 2024-05-29 DIAGNOSIS — C541 Malignant neoplasm of endometrium: Secondary | ICD-10-CM

## 2024-05-29 DIAGNOSIS — Z79899 Other long term (current) drug therapy: Secondary | ICD-10-CM | POA: Diagnosis not present

## 2024-05-29 DIAGNOSIS — I5032 Chronic diastolic (congestive) heart failure: Secondary | ICD-10-CM | POA: Diagnosis not present

## 2024-05-29 DIAGNOSIS — I4891 Unspecified atrial fibrillation: Secondary | ICD-10-CM | POA: Diagnosis not present

## 2024-05-29 DIAGNOSIS — Z8719 Personal history of other diseases of the digestive system: Secondary | ICD-10-CM | POA: Diagnosis not present

## 2024-05-29 DIAGNOSIS — I110 Hypertensive heart disease with heart failure: Secondary | ICD-10-CM

## 2024-05-29 DIAGNOSIS — Z30430 Encounter for insertion of intrauterine contraceptive device: Secondary | ICD-10-CM | POA: Diagnosis not present

## 2024-05-29 DIAGNOSIS — Z87891 Personal history of nicotine dependence: Secondary | ICD-10-CM

## 2024-05-29 HISTORY — PX: OPERATIVE ULTRASOUND: SHX5996

## 2024-05-29 HISTORY — PX: INTRAUTERINE DEVICE (IUD) INSERTION: SHX5877

## 2024-05-29 HISTORY — PX: DILATATION & CURRETTAGE/HYSTEROSCOPY WITH RESECTOCOPE: SHX5572

## 2024-05-29 LAB — GLUCOSE, CAPILLARY
Glucose-Capillary: 123 mg/dL — ABNORMAL HIGH (ref 70–99)
Glucose-Capillary: 108 mg/dL — ABNORMAL HIGH (ref 70–99)
Glucose-Capillary: 108 mg/dL — ABNORMAL HIGH (ref 70–99)

## 2024-05-29 SURGERY — DILATATION & CURETTAGE/HYSTEROSCOPY WITH RESECTOCOPE
Anesthesia: General

## 2024-05-29 MED ORDER — SUGAMMADEX SODIUM 200 MG/2ML IV SOLN
INTRAVENOUS | Status: AC
  Filled 2024-05-29: qty 4

## 2024-05-29 MED ORDER — INSULIN ASPART 100 UNIT/ML IJ SOLN: 0.0000 [IU] | INTRAMUSCULAR | Status: DC | PRN

## 2024-05-29 MED ORDER — SUCCINYLCHOLINE CHLORIDE 200 MG/10ML IV SOSY
PREFILLED_SYRINGE | INTRAVENOUS | Status: AC
  Filled 2024-05-29: qty 10

## 2024-05-29 MED ORDER — ACETAMINOPHEN 500 MG PO TABS
1000.0000 mg | ORAL_TABLET | ORAL | Status: AC
  Administered 2024-05-29: 1000 mg via ORAL
  Filled 2024-05-29: qty 2

## 2024-05-29 MED ORDER — LACTATED RINGERS IV SOLN
INTRAVENOUS | Status: DC
Start: 2024-05-29 — End: 2024-05-29

## 2024-05-29 MED ORDER — FENTANYL CITRATE (PF) 100 MCG/2ML IJ SOLN
INTRAMUSCULAR | Status: DC | PRN
  Administered 2024-05-29: 100 ug via INTRAVENOUS

## 2024-05-29 MED ORDER — FENTANYL CITRATE (PF) 50 MCG/ML IJ SOSY: 25.0000 ug | PREFILLED_SYRINGE | INTRAMUSCULAR | Status: DC | PRN

## 2024-05-29 MED ORDER — LIDOCAINE HCL (CARDIAC) PF 100 MG/5ML IV SOSY
PREFILLED_SYRINGE | INTRAVENOUS | Status: DC | PRN
  Administered 2024-05-29: 100 mg via INTRAVENOUS

## 2024-05-29 MED ORDER — PROPOFOL 10 MG/ML IV BOLUS
INTRAVENOUS | Status: DC | PRN
  Administered 2024-05-29: 200 mg via INTRAVENOUS

## 2024-05-29 MED ORDER — ONDANSETRON HCL 4 MG/2ML IJ SOLN: 4.0000 mg | Freq: Once | INTRAMUSCULAR | Status: DC | PRN

## 2024-05-29 MED ORDER — PROPOFOL 10 MG/ML IV BOLUS
INTRAVENOUS | Status: AC
  Filled 2024-05-29: qty 20

## 2024-05-29 MED ORDER — MIDAZOLAM HCL 5 MG/5ML IJ SOLN
INTRAMUSCULAR | Status: DC | PRN
  Administered 2024-05-29: 1 mg via INTRAVENOUS

## 2024-05-29 MED ORDER — SUGAMMADEX SODIUM 200 MG/2ML IV SOLN
INTRAVENOUS | Status: AC
  Filled 2024-05-29: qty 2

## 2024-05-29 MED ORDER — ORAL CARE MOUTH RINSE: 15.0000 mL | Freq: Once | OROMUCOSAL | Status: AC

## 2024-05-29 MED ORDER — CHLORHEXIDINE GLUCONATE 0.12 % MT SOLN
15.0000 mL | Freq: Once | OROMUCOSAL | Status: AC
  Administered 2024-05-29: 15 mL via OROMUCOSAL

## 2024-05-29 MED ORDER — PHENYLEPHRINE 80 MCG/ML (10ML) SYRINGE FOR IV PUSH (FOR BLOOD PRESSURE SUPPORT)
PREFILLED_SYRINGE | INTRAVENOUS | Status: DC | PRN
  Administered 2024-05-29: 160 ug via INTRAVENOUS
  Administered 2024-05-29 (×2): 80 ug via INTRAVENOUS
  Administered 2024-05-29: 240 ug via INTRAVENOUS
  Administered 2024-05-29: 80 ug via INTRAVENOUS

## 2024-05-29 MED ORDER — ACETAMINOPHEN 10 MG/ML IV SOLN: 1000.0000 mg | Freq: Once | INTRAVENOUS | Status: DC | PRN

## 2024-05-29 MED ORDER — LIDOCAINE HCL (PF) 2 % IJ SOLN
INTRAMUSCULAR | Status: AC
  Filled 2024-05-29: qty 5

## 2024-05-29 MED ORDER — MIDAZOLAM HCL 2 MG/2ML IJ SOLN
INTRAMUSCULAR | Status: AC
  Filled 2024-05-29: qty 2

## 2024-05-29 MED ORDER — LEVONORGESTREL 20 MCG/DAY IU IUD
1.0000 | INTRAUTERINE_SYSTEM | INTRAUTERINE | Status: AC
  Administered 2024-05-29: 1 via INTRAUTERINE
  Filled 2024-05-29 (×2): qty 1

## 2024-05-29 MED ORDER — ROCURONIUM BROMIDE 10 MG/ML (PF) SYRINGE
PREFILLED_SYRINGE | INTRAVENOUS | Status: AC
  Filled 2024-05-29: qty 10

## 2024-05-29 MED ORDER — ONDANSETRON HCL 4 MG/2ML IJ SOLN
INTRAMUSCULAR | Status: AC
  Filled 2024-05-29: qty 2

## 2024-05-29 MED ORDER — SODIUM CHLORIDE 0.9 % IR SOLN
Status: DC | PRN
  Administered 2024-05-29 (×2): 3000 mL

## 2024-05-29 MED ORDER — LIDOCAINE HCL (PF) 1 % IJ SOLN
INTRAMUSCULAR | Status: AC
  Filled 2024-05-29: qty 30

## 2024-05-29 MED ORDER — FENTANYL CITRATE (PF) 100 MCG/2ML IJ SOLN
INTRAMUSCULAR | Status: AC
  Filled 2024-05-29: qty 2

## 2024-05-29 MED ORDER — DEXAMETHASONE SOD PHOSPHATE PF 10 MG/ML IJ SOLN
4.0000 mg | INTRAMUSCULAR | Status: AC
  Administered 2024-05-29: 5 mg via INTRAVENOUS

## 2024-05-29 MED ORDER — ONDANSETRON HCL 4 MG/2ML IJ SOLN
INTRAMUSCULAR | Status: DC | PRN
  Administered 2024-05-29: 4 mg via INTRAVENOUS

## 2024-05-29 MED ORDER — SUCCINYLCHOLINE CHLORIDE 200 MG/10ML IV SOSY
PREFILLED_SYRINGE | INTRAVENOUS | Status: DC | PRN
  Administered 2024-05-29: 160 mg via INTRAVENOUS

## 2024-05-29 SURGICAL SUPPLY — 21 items
BAG COUNTER SPONGE SURGICOUNT (BAG) ×1 IMPLANT
DEVICE MYOSURE LITE (MISCELLANEOUS) IMPLANT
DEVICE MYOSURE REACH (MISCELLANEOUS) IMPLANT
DILATOR CANAL MILEX (MISCELLANEOUS) IMPLANT
DRAPE SURG IRRIG POUCH 19X23 (DRAPES) ×1 IMPLANT
GAUZE 4X4 16PLY ~~LOC~~+RFID DBL (SPONGE) IMPLANT
GLOVE BIO SURGEON STRL SZ 6 (GLOVE) ×2 IMPLANT
GOWN STRL REUS W/ TWL LRG LVL3 (GOWN DISPOSABLE) ×1 IMPLANT
IV NS IRRIG 3000ML ARTHROMATIC (IV SOLUTION) ×1 IMPLANT
KIT PROCEDURE FLUENT (KITS) IMPLANT
KIT TURNOVER KIT A (KITS) ×1 IMPLANT
LOOP CUTTING BIPOLAR 21FR (ELECTRODE) IMPLANT
Mirena IUD IMPLANT
PACK VAGINAL WOMENS (CUSTOM PROCEDURE TRAY) ×1 IMPLANT
PAD OB MATERNITY 11 LF (PERSONAL CARE ITEMS) IMPLANT
SEAL ROD LENS SCOPE MYOSURE (ABLATOR) IMPLANT
SYR BULB IRRIG 60ML STRL (SYRINGE) ×1 IMPLANT
SYSTEM TISS REMOVAL MYOSURE XL (MISCELLANEOUS) IMPLANT
TOWEL OR 17X26 10 PK STRL BLUE (TOWEL DISPOSABLE) ×1 IMPLANT
UNDERPAD 30X36 HEAVY ABSORB (UNDERPADS AND DIAPERS) ×1 IMPLANT
WATER STERILE IRR 500ML POUR (IV SOLUTION) ×1 IMPLANT

## 2024-05-29 NOTE — Anesthesia Postprocedure Evaluation (Signed)
 Anesthesia Post Note  Patient: Zoe Cobb  Procedure(s) Performed: DILATATION & CURETTAGE/HYSTEROSCOPY WITH RESECTOCOPE INSERTION, INTRAUTERINE DEVICE US  INTRAOPERATIVE     Patient location during evaluation: PACU Anesthesia Type: General Level of consciousness: awake and alert Pain management: pain level controlled Vital Signs Assessment: post-procedure vital signs reviewed and stable Respiratory status: spontaneous breathing, nonlabored ventilation, respiratory function stable and patient connected to nasal cannula oxygen  Cardiovascular status: blood pressure returned to baseline and stable Postop Assessment: no apparent nausea or vomiting Anesthetic complications: no   There were no known notable events for this encounter.  Last Vitals:  Vitals:   05/29/24 1415 05/29/24 1428  BP: 111/77 108/84  Pulse: 67 66  Resp: 12   Temp: (!) 36.4 C (!) 36.4 C  SpO2: 95% 91%    Last Pain:  Vitals:   05/29/24 1428  TempSrc: Oral  PainSc: 0-No pain                 Garnette DELENA Gab

## 2024-05-29 NOTE — Interval H&P Note (Signed)
 History and Physical Interval Note:  05/29/2024 10:02 AM  Zoe Cobb  has presented today for surgery, with the diagnosis of ENDOMETRIAL CANCER.  The various methods of treatment have been discussed with the patient and family. After consideration of risks, benefits and other options for treatment, the patient has consented to  Procedure(s) with comments: DILATATION & CURETTAGE/HYSTEROSCOPY WITH RESECTOCOPE (N/A) - MYOSURE INSERTION, INTRAUTERINE DEVICE (N/A) US  INTRAOPERATIVE (N/A) as a surgical intervention.  The patient's history has been reviewed, patient examined, no change in status, stable for surgery.  I have reviewed the patient's chart and labs.  Questions were answered to the patient's satisfaction.     Comer JONELLE Dollar

## 2024-05-29 NOTE — Discharge Instructions (Addendum)
 AFTER SURGERY INSTRUCTIONS   Return to work:  1-2 days if applicable   Activity: 1. Be up and out of the bed during the day.  Take a nap if needed.  You may walk up steps but be careful and use the hand rail.  Stair climbing will tire you more than you think, you may need to stop part way and rest.    2. No lifting or straining for 2 weeks over 10 pounds. No pushing, pulling, straining for 2 weeks.   3. No driving for minimum 24 hours after surgery.  Do not drive if you are taking narcotic pain medicine and make sure that your reaction time has returned.    4. You can shower as soon as the next day after surgery. Shower daily. No tub baths or submerging your body in water until cleared by your surgeon (2 weeks minimum). If you have the soap that was given to you by pre-surgical testing that was used before surgery, you do not need to use it afterwards because this can irritate your incisions.    5. No sexual activity and nothing in the vagina for 2 weeks.   6. You may experience vaginal spotting and discharge after surgery.  The spotting is normal but if you experience heavy bleeding, call our office.   7. Take Tylenol and ibuprofen for pain if you are able to take these medications for discomfort as needed.     Diet: 1. Low sodium Heart Healthy Diet is recommended but you are cleared to resume your normal (before surgery) diet after your procedure.   2. It is safe to use a laxative, such as Miralax or Colace, if you have difficulty moving your bowels.    Wound Care: 1. Keep clean and dry.  Shower daily.   Reasons to call the Doctor: Fever - Oral temperature greater than 100.4 degrees Fahrenheit Foul-smelling vaginal discharge Difficulty urinating Nausea and vomiting Increased pain at the site of the incision that is unrelieved with pain medicine. Difficulty breathing with or without chest pain New calf pain especially if only on one side Sudden, continuing increased vaginal  bleeding with or without clots.   Contacts: For questions or concerns you should contact:   Dr. Wiley Hanger at [phone]   Vira Grieves, NP at [phone]   After Hours: call [phone] and have the GYN Oncologist paged/contacted (after 5 pm or on the weekends).   Messages sent via mychart are for non-urgent matters and are not responded to after hours so for urgent needs, please call the after hours number.

## 2024-05-29 NOTE — Anesthesia Procedure Notes (Signed)
 Procedure Name: Intubation Date/Time: 05/29/2024 11:23 AM  Performed by: Erick Fitz, CRNAPre-anesthesia Checklist: Patient identified, Emergency Drugs available, Suction available, Patient being monitored and Timeout performed Patient Re-evaluated:Patient Re-evaluated prior to induction Oxygen  Delivery Method: Circle system utilized Preoxygenation: Pre-oxygenation with 100% oxygen  Induction Type: IV induction Ventilation: Mask ventilation without difficulty Laryngoscope Size: Glidescope and 4 (GlideScope S4, 1st attempt, easy pass ETT) Grade View: Grade I Tube type: Oral Tube size: 7.0 mm Number of attempts: 1 Airway Equipment and Method: Stylet (GlideScope Stylet) Placement Confirmation: ETT inserted through vocal cords under direct vision, positive ETCO2, CO2 detector and breath sounds checked- equal and bilateral Secured at: 22 cm Tube secured with: Tape (secured with pink Hy-tape at 22 cm) Dental Injury: Teeth and Oropharynx as per pre-operative assessment  Comments: VERY poor dentition!!!!

## 2024-05-29 NOTE — Transfer of Care (Signed)
 Immediate Anesthesia Transfer of Care Note  Patient: Zoe Cobb  Procedure(s) Performed: DILATATION & CURETTAGE/HYSTEROSCOPY WITH RESECTOCOPE INSERTION, INTRAUTERINE DEVICE US  INTRAOPERATIVE  Patient Location: PACU  Anesthesia Type:General  Level of Consciousness: awake, alert , oriented, and patient cooperative  Airway & Oxygen  Therapy: Patient Spontanous Breathing and Patient connected to face mask oxygen   Post-op Assessment: Report given to RN  Post vital signs: Reviewed and stable  Last Vitals:  Vitals Value Taken Time  BP 124/82 05/29/24 12:48  Temp 36.4 C 05/29/24 12:48  Pulse 77 05/29/24 12:53  Resp 16 05/29/24 12:53  SpO2 97 % 05/29/24 12:53  Vitals shown include unfiled device data.  Last Pain:  Vitals:   05/29/24 0900  TempSrc:   PainSc: 0-No pain      Patients Stated Pain Goal: 4 (05/29/24 0900)  Complications: There were no known notable events for this encounter.

## 2024-05-29 NOTE — Brief Op Note (Signed)
 05/29/2024  12:43 PM  PATIENT:  Zoe Cobb  68 y.o. female  PRE-OPERATIVE DIAGNOSIS:  ENDOMETRIAL CANCER  POST-OPERATIVE DIAGNOSIS:  ENDOMETRIAL CANCER  PROCEDURE:  Procedure(s) with comments: DILATATION & CURETTAGE/HYSTEROSCOPY WITH RESECTOCOPE (N/A) - MYOSURE INSERTION, INTRAUTERINE DEVICE (N/A) US  INTRAOPERATIVE (N/A)  SURGEON:  Surgeons and Role:    * Viktoria Comer SAUNDERS, MD - Primary  ASSISTANTS: Flonnie Epps, NP   ANESTHESIA:   general  EBL:  25 mL   BLOOD ADMINISTERED:none  DRAINS: none   LOCAL MEDICATIONS USED:  MARCAINE      SPECIMEN: endometrial curretings  DISPOSITION OF SPECIMEN:  PATHOLOGY  COUNTS:  YES  TOURNIQUET:  * No tourniquets in log *  DICTATION: .Note written in EPIC  PLAN OF CARE: Discharge to home after PACU  PATIENT DISPOSITION:  PACU - hemodynamically stable.   Delay start of Pharmacological VTE agent (>24hrs) due to surgical blood loss or risk of bleeding: not applicable

## 2024-05-29 NOTE — OR Nursing (Signed)
 Hysteroscopy deficit 2490-1370=1120 mL Unable to calculate accurate deficit as a large amount of fluid ended up on the floor.

## 2024-05-29 NOTE — Op Note (Signed)
 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, IUD expulsion   Postoperative Diagnosis: same as above   Surgery: Hysteroscopy with endometrial sampling (dilation and curettage) under ultrasound guidance, Mirena  IUD insertion under ultrasound guidance   Surgeons:  Viktoria Crank, MD   Assistant: none   Anesthesia: General    Estimated blood loss: 50 cc   IVF:  see I&O flowsheet    Fluid deficit: unable to quantify; 2490cc by the machine with 1370cc in the bag and large amount of fluid on floor and underneath patient   Urine output: n/a    Complications: None apparent   Pathology: endometrial curetteings   Operative findings: On EUA, small cervix, uterine size difficult to estimate secondary to body habitus. Cervix unable to be appreciated on speculum exam. Cervix dilated 2 cm on bimanual. Uterus sounds to 9 cm. On hysteroscopy, multiple areas with polypoid tissue within the endometrium. Fundus appreciated based on bedside ultrasound (unable to visualize ostia). ON bedside ultrasound, anterior small fibroid noted. Mirena  IUD lot #[id number], exp 01/2026   Procedure: The patient was identified in the preoperative holding area. Informed consent was signed on the chart. Patient was seen history was reviewed and exam was performed.    The patient was then taken to the operating room and placed in the supine position with SCD hose on.  General anesthesia was then induced without difficulty. She was then placed in the dorsolithotomy position with some difficulty given anability to bed her knees much. The perineum was prepped with Betadine. The vagina was prepped with Betadine. The patient was then draped after the prep was dried.    Timeout was performed the patient, procedure, antibiotic, allergy, and length of procedure.    The speculum was placed in the vagina. Both a normal speculum and a  graves speculum were used and the cervix could not be visualized. The speculum was removed. With one hand guiding, the hysteroscope was passed through the cervix and into the uterus using ultrasound guidance. Hysteroscopy was then performed with findings as above. Given the patient's habitus and location on the bed, I could not advance the camera all the way to the fundus (thus did not visualize bilateral tubal ostia). The Myosure Reach was then used to resected multiple areas of polypoid tissue within the mid and lower uterus body. This specimen was collected and sent to pathology.    The hysteroscope was removed from the uterus. The Mirena  IUD was inserted to the fundus under ultrasound visualization, pulled back, deployed, and inserted back to the fundus. The inserter was then removed and the strings cut at 4-5 cm.    The vagina was irrigated.   All instrument, suture, laparotomy, Ray-Tec, and needle counts were correct x2. The patient tolerated the procedure well and was taken recovery room in stable condition.    Crank JONELLE Viktoria, MD

## 2024-05-29 NOTE — Progress Notes (Signed)
 Preop hoyer lift from wheelchair to stretcher, lives at home.

## 2024-05-30 ENCOUNTER — Telehealth: Payer: Self-pay | Admitting: *Deleted

## 2024-05-30 ENCOUNTER — Encounter (HOSPITAL_COMMUNITY): Payer: Self-pay | Admitting: Gynecologic Oncology

## 2024-05-30 LAB — SURGICAL PATHOLOGY

## 2024-05-30 NOTE — Telephone Encounter (Signed)
 Spoke with Zoe Cobb this morning. She states she is eating, drinking and urinating well.  Encouraged her to drink plenty of water. She denies fever or chills. She is not having any pain.   Instructed to call office with any fever, chills, purulent drainage, uncontrolled pain or any other questions or concerns. Patient verbalizes understanding.   Pt aware of post op appointments as well as the office number [phone] and after hours number [phone] to call if she has any questions or concerns

## 2024-06-04 ENCOUNTER — Inpatient Hospital Stay: Admitting: Gynecologic Oncology

## 2024-06-05 ENCOUNTER — Encounter: Payer: Self-pay | Admitting: Gynecologic Oncology

## 2024-06-05 ENCOUNTER — Inpatient Hospital Stay: Admitting: Gynecologic Oncology

## 2024-06-05 DIAGNOSIS — C541 Malignant neoplasm of endometrium: Secondary | ICD-10-CM

## 2024-06-05 DIAGNOSIS — Z975 Presence of (intrauterine) contraceptive device: Secondary | ICD-10-CM | POA: Diagnosis not present

## 2024-06-05 DIAGNOSIS — Z79890 Hormone replacement therapy (postmenopausal): Secondary | ICD-10-CM

## 2024-06-05 DIAGNOSIS — Z7189 Other specified counseling: Secondary | ICD-10-CM

## 2024-06-05 MED ORDER — MEGESTROL ACETATE 40 MG PO TABS: 80.0000 mg | ORAL_TABLET | Freq: Two times a day (BID) | ORAL | 6 refills | Status: AC

## 2024-06-05 NOTE — Progress Notes (Signed)
 Gynecologic Oncology Telehealth Note: Gyn-Onc  I connected with Zoe Cobb on 06/05/24 at  6:00 PM EDT by telephone and verified that I am speaking with the correct person using two identifiers.  I discussed the limitations, risks, security and privacy concerns of performing an evaluation and management service by telemedicine and the availability of in-person appointments. I also discussed with the patient that there may be a patient responsible charge related to this service. The patient expressed understanding and agreed to proceed.  Other persons participating in the visit and their role in the encounter: none.  Patient's location: home, [place] Provider's location:   Reason for Visit: follow-up  Treatment History: The patient was admitted in 10/2021 for anemia in the setting of vaginal bleeding with plan for outpatient follow-up within a week, which appears did not happen.  Patient was started on 40 mg of Megace  twice daily for a total of 7 days.   In January, the patient was seen as a new patient for menopausal bleeding for more than a year.  This was described as intermittent without associated pain or cramping.  Plan at that time was to proceed with EUA and D&C with ultrasound to assess endometrial lining.  Also discussed suppression with progesterone containing IUD.   The patient has had multiple admissions since for vaginal and/or rectal bleeding.  She was admitted in February for bleeding in the setting of an INR elevated at 3.5.  The patient was admitted in April with vaginal and rectal bleeding found to have a supratherapeutic INR in the setting of being on Coumadin  for A-fib.  She received vitamin K .  She was treated for diverticulitis at that time.  She was briefly admitted again in July after she presented with recurrent vaginal bleeding with an episode lasting for 3 days.  Her hemoglobin was noted to be 7.7.  This is in the setting of being on Coumadin  for A-fib with an INR of  3.6.  Her Coumadin  was held while he received vitamin K  until INR decreased.  She was also given 2 units of packed red blood cells.   Pelvic ultrasound exam was performed on 03/01/2023 and this revealed a uterus measuring 5.7 x 7 x 13.4 cm with a 4 x 5 x 5.2 lesion measured in the uterus corresponding to a fundal leiomyoma better seen on prior CT scan.  Endometrium with markedly limited evaluation, measures up to 12 mm.  Neither ovary seen.   The patient was taken for hysteroscopy with endometrial sampling on 03/12/2023.  Findings at surgery included normal-appearing cervix with abundant friable tissue within a dilated cervical os.  Hysteroscopy was abandoned secondary to abundant tissue limiting view.  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   The patient held her Coumadin  prior to her recent procedure, restarted it on 8/29.  She endorses beginning to have vaginal bleeding and pelvic pain on 8/30 with passage of large blood clots.  This is large-volume vaginal bleeding.  On presentation to the emergency department, mildly hypotensive (blood pressures normally run low), heart rate in the 90s.  Hemoglobin 8.3 with an INR of 2.3.  Patient was admitted to the stepdown unit and transfused 1 unit of packed red blood cells.  She was also given Kcentra  to reverse her INR.  The setting of hypotension, CT scan was obtained to rule out source  of infection.  This was notable for a blood clot in the cervix versus upper vagina.  She was started on Rocephin  and doxycycline  for possible endometritis.   EMB on 09/20/23: FIGO grade 1 endometrioid adenocarcinoma.   10/18/23: Second Mirena  IUD placed in clinic. Pelvic ultrasound obtained that day in clinic showing a uterus measuring 10.3 x 4.9 x 4.9 cm.  Endometrial lining measures 17  mm.  IUD evaluation is very limited but arms of the IUD appear in the lower uterus, possibly penetrating into the myometrium.   01/31/24: Endometrial biopsy shows endometrioid carcinoma with foci of squamous metaplasia, FIGO grade 1.  Endometrial biopsy on 05/09/2024 showed endometrioid adenocarcinoma, FIGO grade 1.  Both IUDs were noted to be in the vagina at the time of this biopsy.  05/29/24: Hysteroscopy with endometrial sampling (dilation and curettage) under ultrasound guidance, Mirena  IUD insertion under ultrasound guidance   Interval History: No bleeding. Denies cramping or pain.   Past Medical/Surgical History: Past Medical History:  Diagnosis Date   Arthritis    Cancer (HCC)    CHF (congestive heart failure) (HCC)    Diabetes mellitus without complication (HCC)    metformin   Dysrhythmia    A-fib   Hypertension    Morbid obesity (HCC)    Multiple sclerosis    asymptomatic   Paroxysmal atrial fibrillation [facility])     Past Surgical History:  Procedure Laterality Date   COLONOSCOPY WITH PROPOFOL  N/A 12/15/2020   Procedure: COLONOSCOPY WITH PROPOFOL ;  Surgeon: Aneita Gwendlyn DASEN, MD;  Location: [facility] ENDOSCOPY;  Service: Endoscopy;  Laterality: N/A;   DILATATION & CURETTAGE/HYSTEROSCOPY WITH MYOSURE N/A 04/04/2023   Procedure: DILATATION & CURETTAGE/HYSTEROSCOPY WITH MYOSURE;  Surgeon: Viktoria Comer SAUNDERS, MD;  Location: [facility] ORS;  Service: Gynecology;  Laterality: N/A;   DILATATION & CURRETTAGE/HYSTEROSCOPY WITH RESECTOCOPE N/A 05/29/2024   Procedure: DILATATION & CURETTAGE/HYSTEROSCOPY WITH RESECTOCOPE;  Surgeon: Viktoria Comer SAUNDERS, MD;  Location: [facility];  Service: Gynecology;  Laterality: N/A;  MYOSURE   HYSTEROSCOPY WITH D & C N/A 03/12/2023   Procedure: DIAGNOSTIC HYSTEROSCOPY, ENDOMETRIAL BIOPSY;  Surgeon: Jeralyn Crutch, MD;  Location: [facility];  Service: Gynecology;  Laterality: N/A;   INTRAUTERINE DEVICE (IUD) INSERTION N/A 04/04/2023   Procedure: INTRAUTERINE DEVICE (IUD)  INSERTION MIRENA , TILT TEST;  Surgeon: Viktoria Comer SAUNDERS, MD;  Location: [facility];  Service: Gynecology;  Laterality: N/A;   INTRAUTERINE DEVICE (IUD) INSERTION N/A 05/29/2024   Procedure: INSERTION, INTRAUTERINE DEVICE;  Surgeon: Viktoria Comer SAUNDERS, MD;  Location: [facility];  Service: Gynecology;  Laterality: N/A;   IR ANGIOGRAM SELECTIVE EACH ADDITIONAL VESSEL  12/13/2023   IR ANGIOGRAM SELECTIVE EACH ADDITIONAL VESSEL  12/13/2023   IR ANGIOGRAM SELECTIVE EACH ADDITIONAL VESSEL  12/13/2023   IR ANGIOGRAM SELECTIVE EACH ADDITIONAL VESSEL  12/13/2023   IR ANGIOGRAM VISCERAL SELECTIVE  12/13/2023   IR RADIOLOGIST EVAL & MGMT  12/14/2023   IR US  GUIDE VASC ACCESS RIGHT  12/13/2023   OPERATIVE ULTRASOUND N/A 05/29/2024   Procedure: US  INTRAOPERATIVE;  Surgeon: Viktoria Comer SAUNDERS, MD;  Location: [facility];  Service: Gynecology;  Laterality: N/A;    Family History  Problem Relation Age of Onset   Hypertension Mother    Diabetes Mother    Stomach cancer Father    Kidney disease Sister    Diabetes Sister    Diabetes Sister    Diabetes Sister    Heart attack Brother    HIV/AIDS Brother    Diabetes Brother    Prostate cancer Brother  Colon cancer Neg Hx    Breast cancer Neg Hx    Ovarian cancer Neg Hx    Endometrial cancer Neg Hx    Pancreatic cancer Neg Hx     Social History   Socioeconomic History   Marital status: Married    Spouse name: Not on file   Number of children: 2   Years of education: Not on file   Highest education level: Not on file  Occupational History   Not on file  Tobacco Use   Smoking status: Former    Current packs/day: 0.00    Average packs/day: 1 pack/day for 9.0 years (9.0 ttl pk-yrs)    Types: Cigarettes    Start date: 37    Quit date: 22    Years since quitting: 29.8   Smokeless tobacco: Never  Vaping Use   Vaping status: Never Used  Substance and Sexual Activity   Alcohol use: No    Alcohol/week: 0.0 standard drinks of alcohol   Drug use: No   Sexual  activity: Not on file  Other Topics Concern   Not on file  Social History Narrative   Pt lives by herself, she completed hs.    Social Drivers of Corporate Investment Banker Strain: Not on file  Food Insecurity: No Food Insecurity (12/14/2023)   Hunger Vital Sign    Worried About Running Out of Food in the Last Year: Never true    Ran Out of Food in the Last Year: Never true  Transportation Needs: No Transportation Needs (12/14/2023)   PRAPARE - Administrator, Civil Service (Medical): No    Lack of Transportation (Non-Medical): No  Physical Activity: Not on file  Stress: Not on file  Social Connections: Socially Integrated (12/14/2023)   Social Connection and Isolation Panel    Frequency of Communication with Friends and Family: More than three times a week    Frequency of Social Gatherings with Friends and Family: Once a week    Attends Religious Services: 1 to 4 times per year    Active Member of [employer] or Organizations: No    Attends Engineer, Structural: 1 to 4 times per year    Marital Status: Married    Current Medications:  Current Outpatient Medications:    megestrol  (MEGACE ) 40 MG tablet, Take 2 tablets (80 mg total) by mouth 2 (two) times daily., Disp: 120 tablet, Rfl: 6   acetaminophen  (TYLENOL ) 500 MG tablet, Take 500 mg by mouth every 6 (six) hours as needed for moderate pain (pain score 4-6)., Disp: , Rfl:    isosorbide mononitrate (IMDUR) 60 MG 24 hr tablet, Take 60 mg by mouth daily., Disp: , Rfl:    letrozole  (FEMARA ) 2.5 MG tablet, Take 1 tablet (2.5 mg total) by mouth daily., Disp: 30 tablet, Rfl: 12   levonorgestrel  (MIRENA ) 20 MCG/DAY IUD, Intrauterine device to be placed in office., Disp: 1 each, Rfl: 0   lidocaine  (LIDODERM ) 5 %, Place 2 patches onto the skin daily as needed (pain)., Disp: , Rfl:    metFORMIN (GLUCOPHAGE) 1000 MG tablet, Take 1,000 mg by mouth 2 (two) times daily., Disp: , Rfl:    metoprolol  succinate (TOPROL -XL) 100 MG 24  hr tablet, Take 1 tablet (100 mg total) by mouth daily. Take with or immediately following a meal., Disp: 90 tablet, Rfl: 3   metoprolol  succinate (TOPROL -XL) 50 MG 24 hr tablet, Take 75 mg by mouth daily. Take with or immediately following a meal.,  Disp: , Rfl:    torsemide  (DEMADEX ) 20 MG tablet, Take 20-60 mg by mouth See admin instructions. Take 60 mg by mouth in the morning and 20 mg in the evening, Disp: , Rfl:    vitamin B-12 (CYANOCOBALAMIN ) 500 MCG tablet, Take 500 mcg by mouth daily., Disp: , Rfl:   Review of Symptoms: Pertinent positives as per HPI.  Physical Exam: Deferred given limitations of phone visit.  Laboratory & Radiologic Studies: A. ENDOMETRIAL, CURETTINGS:       Endometrioid adenocarcinoma, FIGO grade 1, with squamous morular  metaplasia.   Assessment & Plan: Zoe Cobb is a 68 y.o. woman with low-grade endometrioid adenocarcinoma now status post repeat D&C with Mirena  IUD placement.  Pathology continues to show low-grade endometrioid adenocarcinoma.  Patient is overall doing well after her procedure.  Discussed pathology which shows persistent uterine cancer.  IUD was replaced as close to the fundus as I could get it.  Patient continues on letrozole , which is very well-tolerated.  Reviewed again treatment options in the setting of low-grade endometrial cancer.  Unfortunately, based on my most recent D&C, she is not a candidate for vaginal hysterectomy (there is minimal descent and the uterus is quite high).  Due to her weight, she is not a candidate for robotic surgery (previously failed tilt test) and would be at significant morbidity for open surgery.  Discussed possibility of adding some oral progestin, reviewed again side effects that can be seen with higher doses of progestin.  Also discussed that sometimes we use radiation for primary treatment of endometrial cancer in patients who are not a candidate for surgery and in whom progesterone therapy is ineffective.   I think given her weight and her limited mobility that radiation would be very challenging if at all feasible, but I will reach out to her radiation oncologist to get his thoughts.  In the meantime, the patient was amenable to starting an oral progestin.  Prescription for 80 mg of Megace  twice daily was sent to her pharmacy.  I discussed the assessment and treatment plan with the patient. The patient was provided with an opportunity to ask questions and all were answered. The patient agreed with the plan and demonstrated an understanding of the instructions.   The patient was advised to call back or see an in-person evaluation if the symptoms worsen or if the condition fails to improve as anticipated.   12 minutes of total time was spent for this patient encounter, including preparation, phone counseling with the patient and coordination of care, and documentation of the encounter.   Comer Dollar, MD  Division of Gynecologic Oncology  Department of Obstetrics and Gynecology  [facility]

## 2024-06-10 ENCOUNTER — Telehealth: Payer: Self-pay | Admitting: Oncology

## 2024-06-10 ENCOUNTER — Encounter: Payer: Self-pay | Admitting: Oncology

## 2024-06-10 DIAGNOSIS — C541 Malignant neoplasm of endometrium: Secondary | ICD-10-CM

## 2024-06-10 NOTE — Telephone Encounter (Signed)
 Zoe Cobb and discussed that Dr. Viktoria is trying to assess if she would be able to get radiation treatments.  Asked if she would be able to lay flat on a table and she said she is able to lay flat without any issues.  She also mentioned that the pharmacy does not have the new prescription for megace  that Dr. Viktoria sent in last week.  Advised I will call the pharmacy to check on it and will let her know.  [employer] and they have been trying to deliver the Megace .  The are going to try and deliver again today.  Called Zoe Cobb and let her know.

## 2024-06-10 NOTE — Progress Notes (Signed)
 Referral to radiation oncology placed per Dr. Pricilla Holm.

## 2024-06-15 NOTE — Progress Notes (Incomplete)
 Radiation Oncology         [phone] ________________________________  Initial Outpatient Consultation  Name: Zoe Cobb MRN: [id number]  Date: 06/17/2024  DOB: 1956-01-26  RR:Ajmm, Mliss, NP  Viktoria Comer SAUNDERS, MD   REFERRING PHYSICIAN: Viktoria Comer SAUNDERS, MD  DIAGNOSIS: There were no encounter diagnoses.  FIGO grade 1 endometrioid adenocarcinoma  HISTORY OF PRESENT ILLNESS::Zoe Cobb is a 68 y.o. female who is accompanied by ***. she is seen as a courtesy of Dr. Viktoria for an opinion concerning radiation therapy as part of management for her recently diagnosed endometrial carcinoma.   The patient has been in the ED on several occasions for vaginal and/or rectal bleeding. In January of 2024 she was seen by gynecology for menopausal bleeding for more than a year. Plan at that time was to proceed with EUA and D&C with ultrasound to assess endometrial lining. However, that was delayed due to multiple admissions to the ED she was found to have a supratherapeutic INR in the setting of being on Coumadin  for A-fib.       On 03/01/23, she underwent a pelvic ultrasound showing a uterus measuring 5.7 x 7 x 13.4 cm with a 4 x 5 x 5.2 lesion measured in the uterus corresponding to a fundal leiomyoma better seen on prior CT scan. Endometrium with markedly limited evaluation, measures up to 12 mm but neither ovary seen. She then underwent a hysteroscopy with endometrial sampling on 03/12/2023. Findings at surgery included normal-appearing cervix with abundant friable tissue within a dilated cervical os. 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     She underwent a second hysteroscopy on 8/28 as the first procedure was abandoned secondary to abundant tissue limiting view. Pathology confirmed FIGO grade 1 endometrioid endometrial adenocarcinoma within a background of EIN.   She presented to the ED on 8/30 complaining of vaginal  bleeding and pelvic pain with passage of large blood clots. CT scan was obtained to rule out source of infection.  This was notable for a blood clot in the cervix versus upper vagina.  She was started on Rocephin  and doxycycline  for possible endometritis and also given Kcentra  to reverse her INR.   On 2/08/10/23, she underwent an endometrial biopsy under the care of Dr. Viktoria.   Surgical pathology showing FIGO stage 1 well-differentiated endometrioid adenocarcinoma with focal squamous morular metaplasia. An immunohistochemical stain for p53 performed with adequate control shows variable nuclear staining within the tumor nuclei consistent with a wild type staining pattern.  squamous morular metaplasia. She then underwent a pelvic ultrasound on 10/18/23 showing a uterus measuring 10.3 x 4.9 x 4.9 cm. Endometrial lining measures 17 mm. Patient also underwent an IUD placed in clinic at that time as well.   Upon discussion, they opted to proceed with a dilation and curettage of the uterus with hysteroscopy and myosure with Mirena  IUD placement which she underwent on 10/23. Surgical pathology showing Endometrioid adenocarcinoma, FIGO grade 1, with squamous morular metaplasia.     During her most recent follow up with Dr. Viktoria on 06/05/24 to discuss further treatment plan. Dr. Viktoria reported that patient is not a good candidate for vaginal hysterectomy (there is minimal descent and the uterus is quite high).  Due to her weight, she is not a candidate for robotic surgery either as she failed her tilt test and would be at significant morbidity for open surgery. She is currently on letrozole  and megace .       PREVIOUS  RADIATION THERAPY: No  PAST MEDICAL HISTORY:  Past Medical History:  Diagnosis Date   Arthritis    Cancer (HCC)    CHF (congestive heart failure) (HCC)    Diabetes mellitus without complication (HCC)    metformin   Dysrhythmia    A-fib   Hypertension    Morbid obesity (HCC)    Multiple  sclerosis    asymptomatic   Paroxysmal atrial fibrillation (HCC)     PAST SURGICAL HISTORY: Past Surgical History:  Procedure Laterality Date   COLONOSCOPY WITH PROPOFOL  N/A 12/15/2020   Procedure: COLONOSCOPY WITH PROPOFOL ;  Surgeon: Aneita Gwendlyn DASEN, MD;  Location: [facility] ENDOSCOPY;  Service: Endoscopy;  Laterality: N/A;   DILATATION & CURETTAGE/HYSTEROSCOPY WITH MYOSURE N/A 04/04/2023   Procedure: DILATATION & CURETTAGE/HYSTEROSCOPY WITH MYOSURE;  Surgeon: Viktoria Comer SAUNDERS, MD;  Location: [facility] ORS;  Service: Gynecology;  Laterality: N/A;   DILATATION & CURRETTAGE/HYSTEROSCOPY WITH RESECTOCOPE N/A 05/29/2024   Procedure: DILATATION & CURETTAGE/HYSTEROSCOPY WITH RESECTOCOPE;  Surgeon: Viktoria Comer SAUNDERS, MD;  Location: [facility];  Service: Gynecology;  Laterality: N/A;  MYOSURE   HYSTEROSCOPY WITH D & C N/A 03/12/2023   Procedure: DIAGNOSTIC HYSTEROSCOPY, ENDOMETRIAL BIOPSY;  Surgeon: Jeralyn Crutch, MD;  Location: [facility];  Service: Gynecology;  Laterality: N/A;   INTRAUTERINE DEVICE (IUD) INSERTION N/A 04/04/2023   Procedure: INTRAUTERINE DEVICE (IUD) INSERTION MIRENA , TILT TEST;  Surgeon: Viktoria Comer SAUNDERS, MD;  Location: [facility];  Service: Gynecology;  Laterality: N/A;   INTRAUTERINE DEVICE (IUD) INSERTION N/A 05/29/2024   Procedure: INSERTION, INTRAUTERINE DEVICE;  Surgeon: Viktoria Comer SAUNDERS, MD;  Location: [facility];  Service: Gynecology;  Laterality: N/A;   IR ANGIOGRAM SELECTIVE EACH ADDITIONAL VESSEL  12/13/2023   IR ANGIOGRAM SELECTIVE EACH ADDITIONAL VESSEL  12/13/2023   IR ANGIOGRAM SELECTIVE EACH ADDITIONAL VESSEL  12/13/2023   IR ANGIOGRAM SELECTIVE EACH ADDITIONAL VESSEL  12/13/2023   IR ANGIOGRAM VISCERAL SELECTIVE  12/13/2023   IR RADIOLOGIST EVAL & MGMT  12/14/2023   IR US  GUIDE VASC ACCESS RIGHT  12/13/2023   OPERATIVE ULTRASOUND N/A 05/29/2024   Procedure: US  INTRAOPERATIVE;  Surgeon: Viktoria Comer SAUNDERS, MD;  Location: [facility];  Service: Gynecology;  Laterality: N/A;    FAMILY HISTORY:   Family History  Problem Relation Age of Onset   Hypertension Mother    Diabetes Mother    Stomach cancer Father    Kidney disease Sister    Diabetes Sister    Diabetes Sister    Diabetes Sister    Heart attack Brother    HIV/AIDS Brother    Diabetes Brother    Prostate cancer Brother    Colon cancer Neg Hx    Breast cancer Neg Hx    Ovarian cancer Neg Hx    Endometrial cancer Neg Hx    Pancreatic cancer Neg Hx     SOCIAL HISTORY:  Social History   Tobacco Use   Smoking status: Former    Current packs/day: 0.00    Average packs/day: 1 pack/day for 9.0 years (9.0 ttl pk-yrs)    Types: Cigarettes    Start date: 43    Quit date: 1996    Years since quitting: 29.8   Smokeless tobacco: Never  Vaping Use   Vaping status: Never Used  Substance Use Topics   Alcohol use: No    Alcohol/week: 0.0 standard drinks of alcohol   Drug use: No    ALLERGIES:  Allergies  Allergen Reactions   Penicillins Swelling    MEDICATIONS:  Current Outpatient  Medications  Medication Sig Dispense Refill   acetaminophen  (TYLENOL ) 500 MG tablet Take 500 mg by mouth every 6 (six) hours as needed for moderate pain (pain score 4-6).     isosorbide mononitrate (IMDUR) 60 MG 24 hr tablet Take 60 mg by mouth daily.     letrozole  (FEMARA ) 2.5 MG tablet Take 1 tablet (2.5 mg total) by mouth daily. 30 tablet 12   levonorgestrel  (MIRENA ) 20 MCG/DAY IUD Intrauterine device to be placed in office. 1 each 0   lidocaine  (LIDODERM ) 5 % Place 2 patches onto the skin daily as needed (pain).     megestrol  (MEGACE ) 40 MG tablet Take 2 tablets (80 mg total) by mouth 2 (two) times daily. 120 tablet 6   metFORMIN (GLUCOPHAGE) 1000 MG tablet Take 1,000 mg by mouth 2 (two) times daily.     metoprolol  succinate (TOPROL -XL) 100 MG 24 hr tablet Take 1 tablet (100 mg total) by mouth daily. Take with or immediately following a meal. 90 tablet 3   metoprolol  succinate (TOPROL -XL) 50 MG 24 hr tablet Take 75 mg by mouth  daily. Take with or immediately following a meal.     torsemide  (DEMADEX ) 20 MG tablet Take 20-60 mg by mouth See admin instructions. Take 60 mg by mouth in the morning and 20 mg in the evening     vitamin B-12 (CYANOCOBALAMIN ) 500 MCG tablet Take 500 mcg by mouth daily.     No current facility-administered medications for this visit.    REVIEW OF SYSTEMS:  A 10+ POINT REVIEW OF SYSTEMS WAS OBTAINED including neurology, dermatology, psychiatry, cardiac, respiratory, lymph, extremities, GI, GU, musculoskeletal, constitutional, reproductive, HEENT. ***   PHYSICAL EXAM:  vitals were not taken for this visit.   General: Alert and oriented, in no acute distress HEENT: Head is normocephalic. Extraocular movements are intact. Oropharynx is clear. Neck: Neck is supple, no palpable cervical or supraclavicular lymphadenopathy. Heart: Regular in rate and rhythm with no murmurs, rubs, or gallops. Chest: Clear to auscultation bilaterally, with no rhonchi, wheezes, or rales. Abdomen: Soft, nontender, nondistended, with no rigidity or guarding. Extremities: No cyanosis or edema. Lymphatics: see Neck Exam Skin: No concerning lesions. Musculoskeletal: symmetric strength and muscle tone throughout. Neurologic: Cranial nerves II through XII are grossly intact. No obvious focalities. Speech is fluent. Coordination is intact. Psychiatric: Judgment and insight are intact. Affect is appropriate. ***  ECOG = ***  0 - Asymptomatic (Fully active, able to carry on all predisease activities without restriction)  1 - Symptomatic but completely ambulatory (Restricted in physically strenuous activity but ambulatory and able to carry out work of a light or sedentary nature. For example, light housework, office work)  2 - Symptomatic, <50% in bed during the day (Ambulatory and capable of all self care but unable to carry out any work activities. Up and about more than 50% of waking hours)  3 - Symptomatic, >50% in  bed, but not bedbound (Capable of only limited self-care, confined to bed or chair 50% or more of waking hours)  4 - Bedbound (Completely disabled. Cannot carry on any self-care. Totally confined to bed or chair)  5 - Death   Raylene MM, Creech RH, Tormey DC, et al. [phone]). Toxicity and response criteria of the [facility]. Am. DOROTHA Bridges. Oncol. 5 (6): 649-55  LABORATORY DATA:  Lab Results  Component Value Date   WBC 6.7 05/19/2024   HGB 9.1 (L) 05/19/2024   HCT 33.7 (L) 05/19/2024   MCV 74.9 (  L) 05/19/2024   PLT 375 05/19/2024   NEUTROABS 6.0 12/13/2023   Lab Results  Component Value Date   NA 142 05/19/2024   K 4.4 05/19/2024   CL 102 05/19/2024   CO2 27 05/19/2024   GLUCOSE 129 (H) 05/19/2024   BUN 29 (H) 05/19/2024   CREATININE 0.92 05/19/2024   CALCIUM 9.3 05/19/2024      RADIOGRAPHY: US  Intraoperative Result Date: 05/29/2024 CLINICAL DATA:  Ultrasound was provided for use by the ordering physician.  No provider Interpretation or professional fees incurred.       IMPRESSION: FIGO grade 1 endometrioid adenocarcinoma  ***  Today, I talked to the patient and family about the findings and work-up thus far.  We discussed the natural history of *** and general treatment, highlighting the role of radiotherapy in the management.  We discussed the available radiation techniques, and focused on the details of logistics and delivery.  We reviewed the anticipated acute and late sequelae associated with radiation in this setting.  The patient was encouraged to ask questions that I answered to the best of my ability. *** A patient consent form was discussed and signed.  We retained a copy for our records.  The patient would like to proceed with radiation and will be scheduled for CT simulation.  PLAN: ***    *** minutes of total time was spent for this patient encounter, including preparation, face-to-face counseling with the patient and coordination of care,  physical exam, and documentation of the encounter.   ------------------------------------------------  Lynwood CHARM Nasuti, PhD, MD  This document serves as a record of services personally performed by Lynwood Nasuti, MD. It was created on his behalf by Reymundo Cartwright, a trained medical scribe. The creation of this record is based on the scribe's personal observations and the provider's statements to them. This document has been checked and approved by the attending provider.

## 2024-06-16 ENCOUNTER — Other Ambulatory Visit: Payer: Self-pay | Admitting: Oncology

## 2024-06-16 NOTE — Progress Notes (Signed)
 Gynecologic Oncology Multi-Disciplinary Disposition Conference Note  Date of the Conference: 06/16/2024  Patient Name: Zoe Cobb  Referring Provider: Dr. Jeralyn Primary GYN Oncologist: Dr. Viktoria   Stage/Disposition:  FIGO grade 1 endometrioid adenocarcinoma. Disposition is to radiation therapy.  This Multidisciplinary conference took place involving physicians from Gynecologic Oncology, Medical Oncology, Radiation Oncology, Pathology, Radiology along with the Gynecologic Oncology Nurse Practitioner and Gynecologic Oncology Nurse Navigator.  Comprehensive assessment of the patient's malignancy, staging, need for surgery, chemotherapy, radiation therapy, and need for further testing were reviewed. Supportive measures, both inpatient and following discharge were also discussed. The recommended plan of care is documented. Greater than 35 minutes were spent correlating and coordinating this patient's care.

## 2024-06-16 NOTE — Progress Notes (Signed)
 GYN Location of Tumor / Histology: Endometrial  Heron Manifold presented with symptoms of: bleeding  Biopsies revealed:    Past/Anticipated interventions by Gyn/Onc surgery, if any:    Past/Anticipated interventions by medical oncology, if any:   Weight changes, if any: {:18581}  Bowel/Bladder complaints, if any: {yes no:[id number]}, {Blank single:19197::diarrhea,constipation,urinary frequency,burning,trouble emptying bladder, }  Nausea/Vomiting, if any: {:18581}  Pain issues, if any:  {:18581}  SAFETY ISSUES: Prior radiation? {:18581} Pacemaker/ICD? {:18581} Possible current pregnancy? {:18581} Is the patient on methotrexate? {:18581}  Current Complaints / other details:  ***

## 2024-06-17 ENCOUNTER — Encounter: Payer: Self-pay | Admitting: Radiation Oncology

## 2024-06-17 ENCOUNTER — Ambulatory Visit
Admission: RE | Admit: 2024-06-17 | Discharge: 2024-06-17 | Disposition: A | Discharge status: Home / Self Care | Source: Ambulatory Visit | Attending: Radiation Oncology | Admitting: Radiation Oncology

## 2024-06-17 VITALS — BP 119/73 | HR 70 | Temp 97.3°F | Resp 18 | Ht 61.0 in

## 2024-06-17 DIAGNOSIS — Z79899 Other long term (current) drug therapy: Secondary | ICD-10-CM | POA: Diagnosis not present

## 2024-06-17 DIAGNOSIS — R791 Abnormal coagulation profile: Secondary | ICD-10-CM | POA: Insufficient documentation

## 2024-06-17 DIAGNOSIS — Z79811 Long term (current) use of aromatase inhibitors: Secondary | ICD-10-CM | POA: Insufficient documentation

## 2024-06-17 DIAGNOSIS — G35D Multiple sclerosis, unspecified: Secondary | ICD-10-CM | POA: Insufficient documentation

## 2024-06-17 DIAGNOSIS — I480 Paroxysmal atrial fibrillation: Secondary | ICD-10-CM | POA: Diagnosis not present

## 2024-06-17 DIAGNOSIS — I509 Heart failure, unspecified: Secondary | ICD-10-CM | POA: Insufficient documentation

## 2024-06-17 DIAGNOSIS — C541 Malignant neoplasm of endometrium: Secondary | ICD-10-CM

## 2024-06-17 DIAGNOSIS — I10 Essential (primary) hypertension: Secondary | ICD-10-CM | POA: Insufficient documentation

## 2024-06-17 DIAGNOSIS — E669 Obesity, unspecified: Secondary | ICD-10-CM | POA: Diagnosis not present

## 2024-06-17 DIAGNOSIS — E119 Type 2 diabetes mellitus without complications: Secondary | ICD-10-CM | POA: Insufficient documentation

## 2024-06-17 DIAGNOSIS — Z7901 Long term (current) use of anticoagulants: Secondary | ICD-10-CM | POA: Insufficient documentation

## 2024-06-17 DIAGNOSIS — Z8042 Family history of malignant neoplasm of prostate: Secondary | ICD-10-CM | POA: Insufficient documentation

## 2024-06-17 DIAGNOSIS — Z87891 Personal history of nicotine dependence: Secondary | ICD-10-CM | POA: Insufficient documentation

## 2024-06-17 DIAGNOSIS — M129 Arthropathy, unspecified: Secondary | ICD-10-CM | POA: Diagnosis not present

## 2024-06-17 DIAGNOSIS — Z800 Family history of malignant neoplasm of digestive organs: Secondary | ICD-10-CM | POA: Diagnosis not present

## 2024-06-17 DIAGNOSIS — Z7984 Long term (current) use of oral hypoglycemic drugs: Secondary | ICD-10-CM | POA: Diagnosis not present

## 2024-07-07 ENCOUNTER — Other Ambulatory Visit

## 2024-07-16 ENCOUNTER — Other Ambulatory Visit

## 2024-07-23 NOTE — H&P (Signed)
 Gynecologic Oncology H&P  Treatment History: The patient was admitted in 10/2021 for anemia in the setting of vaginal bleeding with plan for outpatient follow-up within a week, which appears did not happen.  Patient was started on 40 mg of Megace  twice daily for a total of 7 days.   In January, the patient was seen as a new patient for menopausal bleeding for more than a year.  This was described as intermittent without associated pain or cramping.  Plan at that time was to proceed with EUA and D&C with ultrasound to assess endometrial lining.  Also discussed suppression with progesterone containing IUD.   The patient has had multiple admissions since for vaginal and/or rectal bleeding.  She was admitted in February for bleeding in the setting of an INR elevated at 3.5.  The patient was admitted in April with vaginal and rectal bleeding found to have a supratherapeutic INR in the setting of being on Coumadin  for A-fib.  She received vitamin K .  She was treated for diverticulitis at that time.  She was briefly admitted again in July after she presented with recurrent vaginal bleeding with an episode lasting for 3 days.  Her hemoglobin was noted to be 7.7.  This is in the setting of being on Coumadin  for A-fib with an INR of 3.6.  Her Coumadin  was held while he received vitamin K  until INR decreased.  She was also given 2 units of packed red blood cells.   Pelvic ultrasound exam was performed on 03/01/2023 and this revealed a uterus measuring 5.7 x 7 x 13.4 cm with a 4 x 5 x 5.2 lesion measured in the uterus corresponding to a fundal leiomyoma better seen on prior CT scan.  Endometrium with markedly limited evaluation, measures up to 12 mm.  Neither ovary seen.   The patient was taken for hysteroscopy with endometrial sampling on 03/12/2023.  Findings at surgery included normal-appearing cervix with abundant friable tissue within a dilated cervical os.  Hysteroscopy was abandoned secondary to abundant tissue  limiting view.  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   Pathology from her procedure on 8/28 again confirmed FIGO grade 1 endometrioid endometrial Cobb within a background of EIN.   The patient held her Coumadin  prior to her recent procedure, restarted it on 8/29.  She endorses beginning to have vaginal bleeding and pelvic pain on 8/30 with passage of large blood clots.  This is large-volume vaginal bleeding.  On presentation to the emergency department, mildly hypotensive (blood pressures normally run low), heart rate in the 90s.  Hemoglobin 8.3 with an INR of 2.3.  Patient was admitted to the stepdown unit and transfused 1 unit of packed red blood cells.  She was also given Kcentra  to reverse her INR.  The setting of hypotension, CT scan was obtained to rule out source of infection.  This was notable for a blood clot in the cervix versus upper vagina.  She was started on Rocephin  and doxycycline  for possible endometritis.   EMB on 09/20/23: FIGO grade 1 endometrioid Cobb.   10/18/23: Second Mirena  IUD placed in clinic. Pelvic ultrasound obtained that day in clinic showing a uterus measuring 10.3 x 4.9 x 4.9 cm.  Endometrial lining measures 17 mm.  IUD evaluation is very limited but arms of the IUD appear in the lower uterus, possibly penetrating into the myometrium.   01/31/24: Endometrial biopsy shows endometrioid carcinoma with foci of squamous metaplasia, FIGO grade 1.  Interval History: Doing well.  Past Medical/Surgical History: Past Medical History:  Diagnosis Date   Arthritis    Cancer (HCC)    CHF (congestive heart failure) (HCC)    Diabetes mellitus without complication (HCC)    metformin   Dysrhythmia    A-fib   Hypertension    Morbid obesity (HCC)    Multiple sclerosis    asymptomatic   Paroxysmal atrial fibrillation [facility])     Past Surgical History:  Procedure Laterality Date   COLONOSCOPY  WITH PROPOFOL  N/A 12/15/2020   Procedure: COLONOSCOPY WITH PROPOFOL ;  Surgeon: Aneita Gwendlyn DASEN, MD;  Location: [facility] ENDOSCOPY;  Service: Endoscopy;  Laterality: N/A;   DILATATION & CURETTAGE/HYSTEROSCOPY WITH MYOSURE N/A 04/04/2023   Procedure: DILATATION & CURETTAGE/HYSTEROSCOPY WITH MYOSURE;  Surgeon: Viktoria Comer SAUNDERS, MD;  Location: [facility] ORS;  Service: Gynecology;  Laterality: N/A;   DILATATION & CURRETTAGE/HYSTEROSCOPY WITH RESECTOCOPE N/A 05/29/2024   Procedure: DILATATION & CURETTAGE/HYSTEROSCOPY WITH RESECTOCOPE;  Surgeon: Viktoria Comer SAUNDERS, MD;  Location: [facility];  Service: Gynecology;  Laterality: N/A;  MYOSURE   HYSTEROSCOPY WITH D & C N/A 03/12/2023   Procedure: DIAGNOSTIC HYSTEROSCOPY, ENDOMETRIAL BIOPSY;  Surgeon: Jeralyn Crutch, MD;  Location: [facility];  Service: Gynecology;  Laterality: N/A;   INTRAUTERINE DEVICE (IUD) INSERTION N/A 04/04/2023   Procedure: INTRAUTERINE DEVICE (IUD) INSERTION MIRENA , TILT TEST;  Surgeon: Viktoria Comer SAUNDERS, MD;  Location: [facility];  Service: Gynecology;  Laterality: N/A;   INTRAUTERINE DEVICE (IUD) INSERTION N/A 05/29/2024   Procedure: INSERTION, INTRAUTERINE DEVICE;  Surgeon: Viktoria Comer SAUNDERS, MD;  Location: [facility];  Service: Gynecology;  Laterality: N/A;   IR ANGIOGRAM SELECTIVE EACH ADDITIONAL VESSEL  12/13/2023   IR ANGIOGRAM SELECTIVE EACH ADDITIONAL VESSEL  12/13/2023   IR ANGIOGRAM SELECTIVE EACH ADDITIONAL VESSEL  12/13/2023   IR ANGIOGRAM SELECTIVE EACH ADDITIONAL VESSEL  12/13/2023   IR ANGIOGRAM VISCERAL SELECTIVE  12/13/2023   IR RADIOLOGIST EVAL & MGMT  12/14/2023   IR US  GUIDE VASC ACCESS RIGHT  12/13/2023   OPERATIVE ULTRASOUND N/A 05/29/2024   Procedure: US  INTRAOPERATIVE;  Surgeon: Viktoria Comer SAUNDERS, MD;  Location: [facility];  Service: Gynecology;  Laterality: N/A;    Family History  Problem Relation Age of Onset   Hypertension Mother    Diabetes Mother    Cancer Father        stomach cancer   Stomach cancer Father    Kidney disease  Sister    Diabetes Sister    Diabetes Sister    Diabetes Sister    Heart attack Brother    HIV/AIDS Brother    Diabetes Brother    Prostate cancer Brother    Colon cancer Neg Hx    Breast cancer Neg Hx    Ovarian cancer Neg Hx    Endometrial cancer Neg Hx    Pancreatic cancer Neg Hx     Social History   Socioeconomic History   Marital status: Married    Spouse name: Not on file   Number of children: 2   Years of education: Not on file   Highest education level: Not on file  Occupational History   Not on file  Tobacco Use   Smoking status: Former    Current packs/day: 0.00    Average packs/day: 1 pack/day for 9.0 years (9.0 ttl pk-yrs)    Types: Cigarettes    Start date: 17    Quit date: 1996    Years since quitting: 29.9   Smokeless tobacco: Never  Vaping  Use   Vaping status: Never Used  Substance and Sexual Activity   Alcohol use: No    Alcohol/week: 0.0 standard drinks of alcohol   Drug use: No   Sexual activity: Not on file  Other Topics Concern   Not on file  Social History Narrative   Pt lives by herself, she completed hs.    Social Drivers of Health   Tobacco Use: Medium Risk (06/05/2024)   Patient History    Smoking Tobacco Use: Former    Smokeless Tobacco Use: Never    Passive Exposure: Not on Actuary Strain: Not on file  Food Insecurity: No Food Insecurity (06/17/2024)   Epic    Worried About Programme Researcher, Broadcasting/film/video in the Last Year: Never true    Ran Out of Food in the Last Year: Never true  Transportation Needs: No Transportation Needs (06/17/2024)   Epic    Lack of Transportation (Medical): No    Lack of Transportation (Non-Medical): No  Physical Activity: Not on file  Stress: Not on file  Social Connections: Socially Integrated (12/14/2023)   Social Connection and Isolation Panel    Frequency of Communication with Friends and Family: More than three times a week    Frequency of Social Gatherings with Friends and Family: Once  a week    Attends Religious Services: 1 to 4 times per year    Active Member of [employer] or Organizations: No    Attends Banker Meetings: 1 to 4 times per year    Marital Status: Married  Depression (PHQ2-9): Low Risk (06/17/2024)   Depression (PHQ2-9)    PHQ-2 Score: 0  Alcohol Screen: Not on file  Housing: Low Risk (06/17/2024)   Epic    Unable to Pay for Housing in the Last Year: No    Number of Times Moved in the Last Year: 0    Homeless in the Last Year: No  Utilities: Not At Risk (06/17/2024)   Epic    Threatened with loss of utilities: No  Health Literacy: Not on file    Current Medications: Current Medications[1]  Review of Systems: Denies appetite changes, fevers, chills, fatigue, unexplained weight changes. Denies hearing loss, neck lumps or masses, mouth sores, ringing in ears or voice changes. Denies cough or wheezing.  Denies shortness of breath. Denies chest pain or palpitations. Denies leg swelling. Denies abdominal distention, pain, blood in stools, constipation, diarrhea, nausea, vomiting, or early satiety. Denies pain with intercourse, dysuria, frequency, hematuria or incontinence. Denies hot flashes, pelvic pain, vaginal bleeding or vaginal discharge.   Denies joint pain, back pain or muscle pain/cramps. Denies itching, rash, or wounds. Denies dizziness, headaches, numbness or seizures. Denies swollen lymph nodes or glands, denies easy bruising or bleeding. Denies anxiety, depression, confusion, or decreased concentration.  Physical Exam: BP 108/84   Pulse 66   Temp (!) 97.5 F (36.4 C) (Oral)   Resp 12   SpO2 91%  General: Alert, oriented, no acute distress. HEENT: Posterior oropharynx clear, sclera anicteric. Chest: Unlabored breathing on room air. Abdomen: Obese, soft, nontender.  Normoactive bowel sounds.  No masses or hepatosplenomegaly appreciated.   Extremities: Grossly normal range of motion.  Warm, well perfused.  Trace edema  bilaterally.  Laboratory & Radiologic Studies: EMB on 05/09/24: FIGO grade 1 endometrioid Cobb  Assessment & Plan: Zoe Cobb is a 68 y.o. 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   IUD placed at the time of D&C on 04/04/23. MMRp,  p53 WT. Most recent biopsy in 10/2023 showed persistent FIGO grade 1 well-differentiated endometrioid Cobb.  Second IUD placed on 10/18/2023. Letrozole  started in 02/2024.  Presents for exam under anesthesia, hysteroscopy, dilation curettage, Mirena  IUD placement, possible ultrasound guidance.    Comer Dollar, MD  Division of Gynecologic Oncology  Department of Obstetrics and Gynecology  [facility]      [1] No current facility-administered medications for this encounter.  Current Outpatient Medications:    acetaminophen  (TYLENOL ) 500 MG tablet, Take 500 mg by mouth every 6 (six) hours as needed for moderate pain (pain score 4-6)., Disp: , Rfl:    isosorbide mononitrate (IMDUR) 60 MG 24 hr tablet, Take 60 mg by mouth daily., Disp: , Rfl:    letrozole  (FEMARA ) 2.5 MG tablet, Take 1 tablet (2.5 mg total) by mouth daily., Disp: 30 tablet, Rfl: 12   lidocaine  (LIDODERM ) 5 %, Place 2 patches onto the skin daily as needed (pain)., Disp: , Rfl:    metFORMIN (GLUCOPHAGE) 1000 MG tablet, Take 1,000 mg by mouth 2 (two) times daily., Disp: , Rfl:    metoprolol  succinate (TOPROL -XL) 50 MG 24 hr tablet, Take 75 mg by mouth daily. Take with or immediately following a meal., Disp: , Rfl:    torsemide  (DEMADEX ) 20 MG tablet, Take 20-60 mg by mouth See admin instructions. Take 60 mg by mouth in the morning and 20 mg in the evening, Disp: , Rfl:    vitamin B-12 (CYANOCOBALAMIN ) 500 MCG tablet, Take 500 mcg by mouth daily., Disp: , Rfl:    levonorgestrel  (MIRENA ) 20 MCG/DAY IUD, Intrauterine device to be placed in office., Disp: 1 each, Rfl: 0   megestrol  (MEGACE ) 40 MG tablet, Take 2  tablets (80 mg total) by mouth 2 (two) times daily., Disp: 120 tablet, Rfl: 6   metoprolol  succinate (TOPROL -XL) 100 MG 24 hr tablet, Take 1 tablet (100 mg total) by mouth daily. Take with or immediately following a meal., Disp: 90 tablet, Rfl: 3

## 2024-08-26 ENCOUNTER — Telehealth: Payer: Self-pay

## 2024-08-26 NOTE — Telephone Encounter (Signed)
 Zoe Cobb called stating she needs to cancel her upcoming appointment with Dr.Tucker on Friday 1/23. She is waiting for a new battery for her wheelchair and will call to reschedule. She says she is doing fine with no concerns.

## 2024-08-29 ENCOUNTER — Ambulatory Visit: Admitting: Gynecologic Oncology
